# Patient Record
Sex: Male | Born: 1935 | Race: White | Hispanic: No | Marital: Married | Smoking: Former smoker
Health system: Southern US, Community
[De-identification: ages and names within clinical notes are randomized; demographics above are authoritative.]

## PROBLEM LIST (undated history)

## (undated) DIAGNOSIS — R001 Bradycardia, unspecified: Secondary | ICD-10-CM

## (undated) DIAGNOSIS — F0390 Unspecified dementia without behavioral disturbance: Secondary | ICD-10-CM

## (undated) DIAGNOSIS — E785 Hyperlipidemia, unspecified: Secondary | ICD-10-CM

## (undated) DIAGNOSIS — R55 Syncope and collapse: Secondary | ICD-10-CM

## (undated) DIAGNOSIS — L0390 Cellulitis, unspecified: Secondary | ICD-10-CM

## (undated) HISTORY — DX: Cellulitis, unspecified: L03.90

## (undated) HISTORY — DX: Hyperlipidemia, unspecified: E78.5

---

## 1991-11-12 HISTORY — PX: KNEE ARTHROSCOPY: SUR90

## 1996-11-11 HISTORY — PX: FOOT SURGERY: SHX648

## 1998-05-11 ENCOUNTER — Encounter: Payer: Self-pay | Admitting: Family Medicine

## 1998-05-11 LAB — CONVERTED CEMR LAB: PSA: 1.2 ng/mL

## 1999-03-05 ENCOUNTER — Emergency Department (HOSPITAL_COMMUNITY): Admission: EM | Admit: 1999-03-05 | Discharge: 1999-03-05 | Payer: Self-pay | Admitting: *Deleted

## 1999-03-05 ENCOUNTER — Encounter: Payer: Self-pay | Admitting: Emergency Medicine

## 1999-12-13 ENCOUNTER — Encounter: Payer: Self-pay | Admitting: Family Medicine

## 1999-12-13 DIAGNOSIS — E785 Hyperlipidemia, unspecified: Secondary | ICD-10-CM

## 1999-12-13 HISTORY — DX: Hyperlipidemia, unspecified: E78.5

## 1999-12-13 LAB — CONVERTED CEMR LAB: PSA: 1 ng/mL

## 2001-01-09 HISTORY — PX: KNEE ARTHROSCOPY: SUR90

## 2002-09-11 ENCOUNTER — Encounter: Payer: Self-pay | Admitting: Family Medicine

## 2002-09-11 LAB — CONVERTED CEMR LAB: PSA: 1.3 ng/mL

## 2003-10-12 ENCOUNTER — Encounter: Payer: Self-pay | Admitting: Family Medicine

## 2004-11-26 ENCOUNTER — Ambulatory Visit: Payer: Self-pay | Admitting: Family Medicine

## 2005-01-09 ENCOUNTER — Encounter: Payer: Self-pay | Admitting: Family Medicine

## 2005-01-22 ENCOUNTER — Ambulatory Visit: Payer: Self-pay | Admitting: Family Medicine

## 2005-01-24 ENCOUNTER — Ambulatory Visit: Payer: Self-pay | Admitting: Family Medicine

## 2005-02-07 ENCOUNTER — Ambulatory Visit: Payer: Self-pay | Admitting: Family Medicine

## 2005-07-17 ENCOUNTER — Ambulatory Visit: Payer: Self-pay | Admitting: Family Medicine

## 2005-07-19 ENCOUNTER — Ambulatory Visit: Payer: Self-pay | Admitting: Family Medicine

## 2005-07-23 ENCOUNTER — Ambulatory Visit: Payer: Self-pay | Admitting: Family Medicine

## 2005-08-06 ENCOUNTER — Ambulatory Visit: Payer: Self-pay | Admitting: Family Medicine

## 2006-01-09 ENCOUNTER — Encounter: Payer: Self-pay | Admitting: Family Medicine

## 2006-01-09 LAB — CONVERTED CEMR LAB: PSA: 1.52 ng/mL

## 2006-01-27 ENCOUNTER — Ambulatory Visit: Payer: Self-pay | Admitting: Family Medicine

## 2006-01-30 ENCOUNTER — Ambulatory Visit: Payer: Self-pay | Admitting: Family Medicine

## 2006-02-12 ENCOUNTER — Ambulatory Visit: Payer: Self-pay | Admitting: Family Medicine

## 2006-05-06 ENCOUNTER — Ambulatory Visit: Payer: Self-pay | Admitting: Family Medicine

## 2006-05-15 ENCOUNTER — Ambulatory Visit: Payer: Self-pay | Admitting: Family Medicine

## 2007-01-10 ENCOUNTER — Encounter: Payer: Self-pay | Admitting: Family Medicine

## 2007-01-10 LAB — CONVERTED CEMR LAB: PSA: 2.74 ng/mL

## 2007-02-02 ENCOUNTER — Ambulatory Visit: Payer: Self-pay | Admitting: Family Medicine

## 2007-02-02 LAB — CONVERTED CEMR LAB
ALT: 14 units/L (ref 0–40)
AST: 21 units/L (ref 0–37)
Albumin: 3.7 g/dL (ref 3.5–5.2)
Alkaline Phosphatase: 61 units/L (ref 39–117)
BUN: 11 mg/dL (ref 6–23)
Bilirubin, Direct: 0.1 mg/dL (ref 0.0–0.3)
CO2: 31 meq/L (ref 19–32)
Calcium: 8.9 mg/dL (ref 8.4–10.5)
Chloride: 105 meq/L (ref 96–112)
Cholesterol: 161 mg/dL (ref 0–200)
Creatinine, Ser: 1.1 mg/dL (ref 0.4–1.5)
GFR calc Af Amer: 85 mL/min
GFR calc non Af Amer: 70 mL/min
Glucose, Bld: 93 mg/dL (ref 70–99)
HDL: 33.5 mg/dL — ABNORMAL LOW (ref >39.0)
LDL Cholesterol: 103 mg/dL — ABNORMAL HIGH (ref 0–99)
PSA: 2.74 ng/mL (ref 0.10–4.00)
Potassium: 4.3 meq/L (ref 3.5–5.1)
Sodium: 142 meq/L (ref 135–145)
TSH: 1.72 microintl units/mL (ref 0.35–5.50)
Total Bilirubin: 0.8 mg/dL (ref 0.3–1.2)
Total CHOL/HDL Ratio: 4.8
Total Protein: 6.1 g/dL (ref 6.0–8.3)
Triglycerides: 122 mg/dL (ref 0–149)
VLDL: 24 mg/dL (ref 0–40)

## 2007-02-03 ENCOUNTER — Encounter: Payer: Self-pay | Admitting: Family Medicine

## 2007-02-03 DIAGNOSIS — N4341 Spermatocele of epididymis, single: Secondary | ICD-10-CM | POA: Insufficient documentation

## 2007-02-03 DIAGNOSIS — F528 Other sexual dysfunction not due to a substance or known physiological condition: Secondary | ICD-10-CM

## 2007-02-03 DIAGNOSIS — N433 Hydrocele, unspecified: Secondary | ICD-10-CM

## 2007-02-03 HISTORY — DX: Other sexual dysfunction not due to a substance or known physiological condition: F52.8

## 2007-02-03 HISTORY — DX: Hydrocele, unspecified: N43.3

## 2007-02-04 DIAGNOSIS — I452 Bifascicular block: Secondary | ICD-10-CM

## 2007-02-04 DIAGNOSIS — L259 Unspecified contact dermatitis, unspecified cause: Secondary | ICD-10-CM | POA: Insufficient documentation

## 2007-02-04 DIAGNOSIS — M722 Plantar fascial fibromatosis: Secondary | ICD-10-CM | POA: Insufficient documentation

## 2007-02-04 HISTORY — DX: Bifascicular block: I45.2

## 2007-02-05 ENCOUNTER — Ambulatory Visit: Payer: Self-pay | Admitting: Family Medicine

## 2007-02-23 ENCOUNTER — Ambulatory Visit: Payer: Self-pay | Admitting: Family Medicine

## 2007-04-03 ENCOUNTER — Ambulatory Visit: Payer: Self-pay | Admitting: Family Medicine

## 2007-04-03 DIAGNOSIS — R972 Elevated prostate specific antigen [PSA]: Secondary | ICD-10-CM | POA: Insufficient documentation

## 2007-04-06 LAB — CONVERTED CEMR LAB: PSA: 3.19 ng/mL (ref 0.10–4.00)

## 2007-04-08 ENCOUNTER — Ambulatory Visit: Payer: Self-pay | Admitting: Family Medicine

## 2007-04-08 DIAGNOSIS — M549 Dorsalgia, unspecified: Secondary | ICD-10-CM | POA: Insufficient documentation

## 2007-04-15 ENCOUNTER — Encounter: Payer: Self-pay | Admitting: Family Medicine

## 2007-05-07 LAB — CONVERTED CEMR LAB: PSA: 2.96 ng/mL

## 2007-06-05 ENCOUNTER — Ambulatory Visit: Payer: Self-pay | Admitting: Family Medicine

## 2007-06-08 ENCOUNTER — Encounter (INDEPENDENT_AMBULATORY_CARE_PROVIDER_SITE_OTHER): Payer: Self-pay | Admitting: *Deleted

## 2008-02-04 ENCOUNTER — Ambulatory Visit: Payer: Self-pay | Admitting: Family Medicine

## 2008-02-04 DIAGNOSIS — E785 Hyperlipidemia, unspecified: Secondary | ICD-10-CM

## 2008-02-04 LAB — CONVERTED CEMR LAB
ALT: 19 units/L (ref 0–53)
AST: 21 units/L (ref 0–37)
Albumin: 4 g/dL (ref 3.5–5.2)
Alkaline Phosphatase: 71 units/L (ref 39–117)
BUN: 9 mg/dL (ref 6–23)
Bilirubin, Direct: 0.1 mg/dL (ref 0.0–0.3)
CO2: 33 meq/L — ABNORMAL HIGH (ref 19–32)
Calcium: 9 mg/dL (ref 8.4–10.5)
Chloride: 102 meq/L (ref 96–112)
Cholesterol: 164 mg/dL (ref 0–200)
Creatinine, Ser: 1.2 mg/dL (ref 0.4–1.5)
GFR calc Af Amer: 77 mL/min
GFR calc non Af Amer: 63 mL/min
Glucose, Bld: 93 mg/dL (ref 70–99)
HDL: 34.8 mg/dL — ABNORMAL LOW (ref >39.0)
LDL Cholesterol: 115 mg/dL — ABNORMAL HIGH (ref 0–99)
PSA: 2.47 ng/mL (ref 0.10–4.00)
Potassium: 4.8 meq/L (ref 3.5–5.1)
Sodium: 138 meq/L (ref 135–145)
TSH: 1.19 microintl units/mL (ref 0.35–5.50)
Total Bilirubin: 0.9 mg/dL (ref 0.3–1.2)
Total CHOL/HDL Ratio: 4.7
Total Protein: 6.6 g/dL (ref 6.0–8.3)
Triglycerides: 70 mg/dL (ref 0–149)
VLDL: 14 mg/dL (ref 0–40)

## 2008-02-09 ENCOUNTER — Ambulatory Visit: Payer: Self-pay | Admitting: Family Medicine

## 2008-03-21 ENCOUNTER — Ambulatory Visit: Payer: Self-pay | Admitting: Family Medicine

## 2008-03-21 LAB — CONVERTED CEMR LAB
OCCULT 1: NEGATIVE
OCCULT 2: NEGATIVE
OCCULT 3: NEGATIVE

## 2008-03-23 ENCOUNTER — Encounter (INDEPENDENT_AMBULATORY_CARE_PROVIDER_SITE_OTHER): Payer: Self-pay | Admitting: *Deleted

## 2008-09-22 ENCOUNTER — Ambulatory Visit: Payer: Self-pay | Admitting: Family Medicine

## 2009-02-07 ENCOUNTER — Ambulatory Visit: Payer: Self-pay | Admitting: Family Medicine

## 2009-02-07 LAB — CONVERTED CEMR LAB
ALT: 18 units/L (ref 0–53)
AST: 22 units/L (ref 0–37)
Albumin: 3.9 g/dL (ref 3.5–5.2)
Alkaline Phosphatase: 66 units/L (ref 39–117)
BUN: 13 mg/dL (ref 6–23)
Bilirubin, Direct: 0.2 mg/dL (ref 0.0–0.3)
CO2: 32 meq/L (ref 19–32)
Calcium: 9 mg/dL (ref 8.4–10.5)
Chloride: 106 meq/L (ref 96–112)
Cholesterol: 162 mg/dL (ref 0–200)
Creatinine, Ser: 1.1 mg/dL (ref 0.4–1.5)
GFR calc non Af Amer: 69.74 mL/min (ref >60)
Glucose, Bld: 101 mg/dL — ABNORMAL HIGH (ref 70–99)
HDL: 30.8 mg/dL — ABNORMAL LOW (ref >39.00)
LDL Cholesterol: 111 mg/dL — ABNORMAL HIGH (ref 0–99)
PSA: 2.11 ng/mL (ref 0.10–4.00)
Potassium: 4.5 meq/L (ref 3.5–5.1)
Sodium: 142 meq/L (ref 135–145)
TSH: 1.49 microintl units/mL (ref 0.35–5.50)
Total Bilirubin: 1.1 mg/dL (ref 0.3–1.2)
Total CHOL/HDL Ratio: 5
Total Protein: 6.3 g/dL (ref 6.0–8.3)
Triglycerides: 102 mg/dL (ref 0.0–149.0)
VLDL: 20.4 mg/dL (ref 0.0–40.0)

## 2009-02-09 ENCOUNTER — Ambulatory Visit: Payer: Self-pay | Admitting: Family Medicine

## 2009-03-02 ENCOUNTER — Ambulatory Visit: Payer: Self-pay | Admitting: Family Medicine

## 2009-03-03 ENCOUNTER — Encounter (INDEPENDENT_AMBULATORY_CARE_PROVIDER_SITE_OTHER): Payer: Self-pay | Admitting: *Deleted

## 2009-12-14 ENCOUNTER — Ambulatory Visit: Payer: Self-pay | Admitting: Family Medicine

## 2009-12-22 ENCOUNTER — Encounter: Payer: Self-pay | Admitting: Family Medicine

## 2010-01-01 ENCOUNTER — Ambulatory Visit: Payer: Self-pay | Admitting: Family Medicine

## 2010-01-03 ENCOUNTER — Encounter: Payer: Self-pay | Admitting: Family Medicine

## 2010-01-08 ENCOUNTER — Telehealth: Payer: Self-pay | Admitting: Family Medicine

## 2010-01-15 ENCOUNTER — Ambulatory Visit: Payer: Self-pay | Admitting: Family Medicine

## 2010-02-13 ENCOUNTER — Ambulatory Visit: Payer: Self-pay | Admitting: Family Medicine

## 2010-02-13 LAB — CONVERTED CEMR LAB
ALT: 19 units/L (ref 0–53)
Albumin: 4 g/dL (ref 3.5–5.2)
Alkaline Phosphatase: 72 units/L (ref 39–117)
Bilirubin, Direct: 0.1 mg/dL (ref 0.0–0.3)
CO2: 30 meq/L (ref 19–32)
Calcium: 9.1 mg/dL (ref 8.4–10.5)
Chloride: 103 meq/L (ref 96–112)
Creatinine, Ser: 1.2 mg/dL (ref 0.4–1.5)
Eosinophils Relative: 1.3 % (ref 0.0–5.0)
Glucose, Bld: 89 mg/dL (ref 70–99)
HCT: 43.8 % (ref 39.0–52.0)
HDL: 38 mg/dL — ABNORMAL LOW (ref >39.00)
Hemoglobin: 15.4 g/dL (ref 13.0–17.0)
Lymphs Abs: 1.3 10*3/uL (ref 0.7–4.0)
MCV: 94.9 fL (ref 78.0–100.0)
Monocytes Absolute: 0.5 10*3/uL (ref 0.1–1.0)
Monocytes Relative: 10.7 % (ref 3.0–12.0)
Neutro Abs: 2.5 10*3/uL (ref 1.4–7.7)
Platelets: 101 10*3/uL — ABNORMAL LOW (ref 150.0–400.0)
Total CHOL/HDL Ratio: 5
Total Protein: 6.5 g/dL (ref 6.0–8.3)
Triglycerides: 97 mg/dL (ref 0.0–149.0)
WBC: 4.3 10*3/uL — ABNORMAL LOW (ref 4.5–10.5)

## 2010-02-19 ENCOUNTER — Ambulatory Visit: Payer: Self-pay | Admitting: Family Medicine

## 2010-03-19 ENCOUNTER — Ambulatory Visit: Payer: Self-pay | Admitting: Family Medicine

## 2010-03-19 LAB — CONVERTED CEMR LAB
OCCULT 1: NEGATIVE
OCCULT 2: NEGATIVE
OCCULT 3: NEGATIVE

## 2010-03-20 ENCOUNTER — Encounter (INDEPENDENT_AMBULATORY_CARE_PROVIDER_SITE_OTHER): Payer: Self-pay | Admitting: *Deleted

## 2010-06-14 ENCOUNTER — Encounter (INDEPENDENT_AMBULATORY_CARE_PROVIDER_SITE_OTHER): Payer: Self-pay | Admitting: *Deleted

## 2010-10-03 ENCOUNTER — Ambulatory Visit: Payer: Self-pay | Admitting: Family Medicine

## 2010-12-13 NOTE — Assessment & Plan Note (Signed)
Summary: CUT LEFT LEG and NOT HEALING / LFW   Vital Signs:  Patient profile:   77 year old male Weight:      173 pounds Temp:     97.4 degrees F oral Pulse rate:   60 / minute Pulse rhythm:   regular BP sitting:   112 / 70  (left arm) Cuff size:   regular  Vitals Entered By: Sydell Axon LPN (October 03, 2010 2:34 PM) CC: Cut left leg about 2 weeks ago and it is not healing   History of Present Illness: Pt here for having split open his left shin on a rusty piece of metal 2 weeks ago. He has taken good care of it but still filling in and healing and he wanted eval.   Problems Prior to Update: 1)  Open Wound Left Shin Without Mention Comp  (ICD-891.0) 2)  Health Maintenance Exam  (ICD-V70.0) 3)  Special Screening Malignant Neoplasm of Prostate  (ICD-V76.44) 4)  Hypercholesterolemia  (ICD-272.0) 5)  Screening For Malignannt Neoplasm, Site Nec  (ICD-V76.49) 6)  Backache Nos  (ICD-724.5) 7)  Elevated Prostate Specific Antigen  (ICD-790.93) 8)  Block, Rbb/left Ant Fascicular Block  (ICD-426.52) 9)  Dermatitis, Contact, Nos  (ICD-692.9) 10)  Plantar Fasciitis, Bilateral  (ICD-728.71) 11)  Hydrocele, Left  (ICD-603.9) 12)  Erectile Dysfunction, Mild  (ICD-302.72)  Medications Prior to Update: 1)  Viagra 100 Mg Tabs (Sildenafil Citrate) .... One Tab By Mouth One Hour Prior 2)  Temovate E 0.05 % Crea (Clobetasol Prop Emollient Base) .... Use On Area Two Times A Day Never On Face. 3)  Biofreeze Gel .... As Needed  Allergies: 1)  ! * Lincocin 2)  ! * Lincocin  Physical Exam  General:  Well-developed,well-nourished,in no acute distress; alert,appropriate and cooperative throughout examination Extremities:  L ant shin, healing 1cm laceratiuon by secondary intention. Has approx 5mm left to fill in. No fluctulance and only erythema of healing evident. No tenderness, no discharge.   Impression & Recommendations:  Problem # 1:  OPEN WOUND LEFT SHIN WITHOUT MENTION COMP  (ICD-891.0) Assessment New Let heal, keep clean. Tdap today....>5 yrs since last with rusty metal the offending agent.  Complete Medication List: 1)  Viagra 100 Mg Tabs (Sildenafil citrate) .... One tab by mouth one hour prior 2)  Temovate E 0.05 % Crea (Clobetasol prop emollient base) .... Use on area two times a day never on face. 3)  Biofreeze Gel  .... As needed  Other Orders: Tdap => 72yrs IM (54098) Admin 1st Vaccine (11914)  Patient Instructions: 1)  RTC for further problems.   Orders Added: 1)  Est. Patient Level III [78295] 2)  Tdap => 55yrs IM [90715] 3)  Admin 1st Vaccine [62130]   Immunizations Administered:  Tetanus Vaccine:    Vaccine Type: Tdap    Site: left deltoid    Mfr: GlaxoSmithKline    Dose: 0.5 ml    Route: IM    Given by: Sydell Axon LPN    Exp. Date: 08/30/2012    Lot #: [id number]    VIS given: 09/28/08 version given October 03, 2010.   Immunizations Administered:  Tetanus Vaccine:    Vaccine Type: Tdap    Site: left deltoid    Mfr: GlaxoSmithKline    Dose: 0.5 ml    Route: IM    Given by: Sydell Axon LPN    Exp. Date: 08/30/2012    Lot #: [id number]    VIS given: 09/28/08 version given October 03, 2010.  Current Allergies (reviewed today): ! * LINCOCIN ! * LINCOCIN

## 2010-12-13 NOTE — Consult Note (Signed)
Summary: Dr.Ronald Davis,[facility],Note  Dr.Ronald Davis,[facility],Note   Imported By: Beau Fanny 12/27/2009 14:57:33  _____________________________________________________________________  External Attachment:    Type:   Image     Comment:   External Document

## 2010-12-13 NOTE — Letter (Signed)
Summary: Results Follow up Letter  [place] [facility]  [address] [place], [place] [address]   Phone: [phone]  Fax: [phone]    03/20/2010 MRN: [id number]  ELAM ELLIS [address] [place], [place]  [address]  Dear Mr. FARINO,  The following are the results of your recent test(s):  Test         Result    Pap Smear:        Normal _____  Not Normal _____ Comments: ______________________________________________________ Cholesterol: LDL(Bad cholesterol):         Your goal is less than:         HDL (Good cholesterol):       Your goal is more than: Comments:  ______________________________________________________ Mammogram:        Normal _____  Not Normal _____ Comments:  ___________________________________________________________________ Hemoccult:        Normal __X___  Not normal _______ Comments:  Please repeat in one year.  _____________________________________________________________________ Other Tests:    We routinely do not discuss normal results over the telephone.  If you desire a copy of the results, or you have any questions about this information we can discuss them at your next office visit.   Sincerely,     Laurita Quint, MD

## 2010-12-13 NOTE — Letter (Signed)
Summary: [facility] / EVALUATION / DR. RONALD DAVIS  [facility] / EVALUATION / DR. RONALD DAVIS   Imported By: Carin Primrose 12/26/2009 16:08:46  _____________________________________________________________________  External Attachment:    Type:   Image     Comment:   External Document

## 2010-12-13 NOTE — Assessment & Plan Note (Signed)
Summary: CPX/DLO   Vital Signs:  Patient profile:   77 year old male Height:      72 inches Weight:      171.50 pounds BMI:     23.34 Temp:     98.7 degrees F oral Pulse rate:   60 / minute Pulse rhythm:   regular BP sitting:   126 / 74  (left arm) Cuff size:   regular  Vitals Entered By: Sydell Axon LPN (February 19, 2010 2:51 PM) CC: 30 Minute checkup, hemoccult cards given to patient   History of Present Illness: Pt here for Comp Exam. He has a lesion on the back of his left hand that he thought he had a splinter but never got the splinter out. Now he thinks it might have been a  He was seen by a chiropracter after being seen here and the chiro suggested cerviacl MRI.   Preventive Screening-Counseling & Management  Alcohol-Tobacco     Alcohol drinks/day: 0     Smoking Status: quit > 6 months     Packs/Day: pack a week     Year Started: 1955     Year Quit: 1962     Pack years: 5     Passive Smoke Exposure: no  Caffeine-Diet-Exercise     Caffeine use/day: 3     Does Patient Exercise: no  Problems Prior to Update: 1)  Health Maintenance Exam  (ICD-V70.0) 2)  Special Screening Malignant Neoplasm of Prostate  (ICD-V76.44) 3)  Hypercholesterolemia  (ICD-272.0) 4)  Screening For Malignannt Neoplasm, Site Nec  (ICD-V76.49) 5)  Backache Nos  (ICD-724.5) 6)  Elevated Prostate Specific Antigen  (ICD-790.93) 7)  Block, Rbb/left Ant Fascicular Block  (ICD-426.52) 8)  Dermatitis, Contact, Nos  (ICD-692.9) 9)  Plantar Fasciitis, Bilateral  (ICD-728.71) 10)  Hydrocele, Left  (ICD-603.9) 11)  Erectile Dysfunction, Mild  (ICD-302.72)  Medications Prior to Update: 1)  Viagra 100 Mg Tabs (Sildenafil Citrate) .... One Tab By Mouth One Hour Prior 2)  Temovate E 0.05 % Crea (Clobetasol Prop Emollient Base) .... Use On Area Two Times A Day Never On Face. 3)  Biofreeze Gel .... As Needed 4)  Flexeril 10 Mg Tabs (Cyclobenzaprine Hcl) .... One Tab By Mouth Three Times A Day As Needed 5)   Tylenol Extra Strength 500 Mg Tabs (Acetaminophen) .... As Needed  Allergies: 1)  ! * Lincocin 2)  ! * Lincocin  Family History: Father:dec 84 CHF Stroke ( 12 years of age) Mother:dec 99 CHF  Siblings: Brother A 80  VA Brother A 80  VA Brother A 64 Sister A 75 Shoulder Surg  Social History: Caffeine use/day:  3  Review of Systems General:  Denies chills, fatigue, fever, sweats, weakness, and weight loss. Eyes:  Denies blurring, discharge, and eye pain. ENT:  Complains of ringing in ears; denies decreased hearing and ear discharge; occas left ear. CV:  Denies chest pain or discomfort, fainting, fatigue, palpitations, shortness of breath with exertion, swelling of feet, and swelling of hands. Resp:  Denies cough, shortness of breath, and wheezing. GI:  Denies abdominal pain, bloody stools, change in bowel habits, constipation, dark tarry stools, diarrhea, indigestion, loss of appetite, nausea, vomiting, vomiting blood, and yellowish skin color. GU:  Denies dysuria, nocturia, and urinary frequency. MS:  Complains of joint pain and low back pain; denies muscle aches, cramps, muscle weakness, and stiffness. Derm:  Complains of rash; denies dryness and itching. Neuro:  Denies numbness, poor balance, tingling, and tremors.  Physical  Exam  General:  Well-developed,well-nourished,in no acute distress; alert,appropriate and cooperative throughout examination Head:  Normocephalic and atraumatic without obvious abnormalities. No apparent alopecia or balding. Eyes:  Conjunctiva clear bilaterally.  Ears:  External ear exam shows no significant lesions or deformities.  Otoscopic examination reveals clear canals, tympanic membranes are intact bilaterally without bulging, retraction, inflammation or discharge. Hearing is grossly normal bilaterally. Nose:  External nasal examination shows no deformity or inflammation. Nasal mucosa are pink and moist without lesions or exudates. Mouth:  Oral mucosa  and oropharynx without lesions or exudates.  Teeth in good repair. Neck:  No deformities, masses  noted. Chest Wall:  No deformities, masses, tenderness or gynecomastia noted. Breasts:  No masses or gynecomastia noted Lungs:  Normal respiratory effort, chest expands symmetrically. Lungs are clear to auscultation, no crackles or wheezes. Heart:  Normal rate and regular rhythm. S1 and S2 normal without gallop, murmur, click, rub or other extra sounds. Abdomen:  Bowel sounds positive,abdomen soft and non-tender without masses, organomegaly or hernias noted. Rectal:  No external abnormalities noted. Normal sphincter tone. No rectal masses or tenderness. G neg. Genitalia:  Testes bilaterally descended withoutnodularity or tenderness. No scrotal masses but for a large hydrocele of the left scrotum. No other  lesions. No penis lesions or urethral discharge. Small right inguinal hernia, feels indirect. Prostate:  Prostate gland firm and smooth, no enlargement, nodularity, tenderness, mass, asymmetry or induration. 40-60gms. Msk:  Patient has pain to deep palpation over R scapula.  He has normal range of motion and strength in both shoulders and arms and doing well today.. Pulses:  R and L carotid,radial,femoral,dorsalis pedis and posterior tibial pulses are full and equal bilaterally Extremities:  No clubbing, cyanosis, edema, or deformity noted with normal full range of motion of all joints.   Neurologic:  No cranial nerve deficits noted. Station and gait are normal. Sensory, motor and coordinative functions appear intact. Skin:  Intact without suspicious lesions or rashes except for small pauplar eruption on the dorsal surface of the left hand over a superficial vein. Well cicumscribed borders with mild inflammation and redness that looks to be healing. Cervical Nodes:  No lymphadenopathy noted Inguinal Nodes:  No significant adenopathy Psych:  Cognition and judgment appear intact. Alert and cooperative  with normal attention span and concentration. No apparent delusions, illusions, hallucinations, well adjusted with good eye contact.   Impression & Recommendations:  Problem # 1:  HEALTH MAINTENANCE EXAM (ICD-V70.0) Discussed labs, meds and immunizations. Could use a pneumovax next time.  Problem # 2:  SPECIAL SCREENING MALIGNANT NEOPLASM OF PROSTATE (ICD-V76.44) Assessment: Unchanged Stable PSA and exam.  Problem # 3:  BACKACHE NOS (ICD-724.5) Assessment: Unchanged Stable and doing well today. The following medications were removed from the medication list:    Flexeril 10 Mg Tabs (Cyclobenzaprine hcl) ..... One tab by mouth three times a day as needed    Tylenol Extra Strength 500 Mg Tabs (Acetaminophen) .Marland Kitchen... As needed  Problem # 4:  ELEVATED PROSTATE SPECIFIC ANTIGEN (ICD-790.93) Assessment: Unchanged Stable today.  Problem # 5:  PLANTAR FASCIITIS, BILATERAL (ICD-728.71) Assessment: Improved Basically nml today.  Problem # 6:  HYDROCELE, LEFT (ICD-603.9) Assessment: Unchanged Unchanged, bothers him at times.  Problem # 7:  ERECTILE DYSFUNCTION, MILD (ICD-302.72) Assessment: Unchanged Uses Viagra with good results. Requests refill. His updated medication list for this problem includes:    Viagra 100 Mg Tabs (Sildenafil citrate) ..... One tab by mouth one hour prior  Discussed proper use of medications, as well  as side effects.   Complete Medication List: 1)  Viagra 100 Mg Tabs (Sildenafil citrate) .... One tab by mouth one hour prior 2)  Temovate E 0.05 % Crea (Clobetasol prop emollient base) .... Use on area two times a day never on face. 3)  Biofreeze Gel  .... As needed  Patient Instructions: 1)  Pneumovax next time. Prescriptions: VIAGRA 100 MG TABS (SILDENAFIL CITRATE) one tab by mouth one hour prior  #10 x 12   Entered and Authorized by:   Shaune Leeks MD   Signed by:   Shaune Leeks MD on 02/19/2010   Method used:   Electronically to         [facility] [address] (retail)       [address], [address]       [place], [place]  [address]       Ph: [phone]       Fax: [phone]   RxID:   [id number]   Current Allergies (reviewed today): ! * LINCOCIN ! * LINCOCIN

## 2010-12-13 NOTE — Assessment & Plan Note (Signed)
Summary: RIGHT SIDE PAIN / LFW   Vital Signs:  Patient profile:   77 year old male Weight:      173 pounds Temp:     98.7 degrees F oral Pulse rate:   60 / minute Pulse rhythm:   regular BP sitting:   128 / 78  (left arm) Cuff size:   regular  Vitals Entered By: Sydell Axon LPN (January 01, 2010 3:35 PM) CC: Pain in right shoulder blade and upper right arm   History of Present Illness: Pt here for pain in the right chest through to the medial side of the shoulder blade. He is unable to sleep for the pain. He denies SOB, no episode of inactivuity. 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 He can think of notyhing he has done unusual. When he gets to work and has nothing to do, he stretches aggressively. He does this every day, does not remember feeling as if he pulled anyhting and otherwise denies falling or pulling anything. Again he denies SOB or overt chest pain and he can definitely reproduce the discomfort with movement of the rightt shoulder.  Problems Prior to Update: 1)  Health Maintenance Exam  (ICD-V70.0) 2)  Special Screening Malignant Neoplasm of Prostate  (ICD-V76.44) 3)  Hypercholesterolemia  (ICD-272.0) 4)  Screening For Malignannt Neoplasm, Site Nec  (ICD-V76.49) 5)  Backache Nos  (ICD-724.5) 6)  Elevated Prostate Specific Antigen  (ICD-790.93) 7)  Block, Rbb/left Ant Fascicular Block  (ICD-426.52) 8)  Dermatitis, Contact, Nos  (ICD-692.9) 9)  Plantar Fasciitis, Bilateral  (ICD-728.71) 10)  Hydrocele, Left  (ICD-603.9) 11)  Erectile Dysfunction, Mild  (ICD-302.72)  Medications Prior to Update: 1)  Viagra 100 Mg Tabs (Sildenafil Citrate) .... One Tab By Mouth One Hour Prior 2)  Temovate E 0.05 % Crea (Clobetasol Prop Emollient Base) .... Use On Area Two Times A Day Never On Face.  Allergies: 1)  ! * Lincocin 2)  ! * Lincocin  Physical Exam  General:  Well-developed,well-nourished,in no acute distress;  alert,appropriate and cooperative throughout examination Head:  Normocephalic and atraumatic without obvious abnormalities. No apparent alopecia or balding. Eyes:  Conjunctiva clear bilaterally.  Ears:  External ear exam shows no significant lesions or deformities.  Otoscopic examination reveals clear canals, tympanic membranes are intact bilaterally without bulging, retraction, inflammation or discharge. Hearing is grossly normal bilaterally. Nose:  External nasal examination shows no deformity or inflammation. Nasal mucosa are pink and moist without lesions or exudates. Mouth:  Oral mucosa and oropharynx without lesions or exudates.  Teeth in good repair. Msk:  Right shoulder tender to deep palpation lateral and above the right latera edge of the scapula andf in the supraclavicular fosdsa. No swelling or echymosis noted. His upper Bicep area ached after resisted manipulation of the shouklder blade and upper lateral back muscles.   Impression & Recommendations:  Problem # 1:  BACK STRAIN, RIGHT UPPER (ICD-847.9) Assessment New Pooss suscapularis, poss rotator cuff stabilizers.   See instructions.  Complete Medication List: 1)  Viagra 100 Mg Tabs (Sildenafil citrate) .... One tab by mouth one hour prior 2)  Temovate E 0.05 % Crea (Clobetasol prop emollient base) .... Use on area two times a day never on face. 3)  Biofreeze Gel  .... As needed 4)  Flexeril 10 Mg Tabs (Cyclobenzaprine hcl) .... One tab by mouth three times a day  Patient Instructions: 1)  Take 600mg  of IBP after meals. 2)  Take Flexeril 10mg   three times a day . 3)  Use heat and ice.  Prescriptions: FLEXERIL 10 MG TABS (CYCLOBENZAPRINE HCL) one tab by mouth three times a day  #50 x 0   Entered and Authorized by:   Shaune Leeks MD   Signed by:   Shaune Leeks MD on 01/01/2010   Method used:   Electronically to        [facility] [address] (retail)       [address]       [place], [place]  [address]       Ph: [phone]       Fax: [phone]   RxID:   [id number]   Current Allergies (reviewed today): ! * LINCOCIN ! * LINCOCIN

## 2010-12-13 NOTE — Letter (Signed)
Summary: Nadara Eaton letter  [place] at [facility]  [address] [place], [place] [address]   Phone: [phone]  Fax: [phone]       06/14/2010 MRN: [id number]  COULTER OLDAKER [address] [place], [place]  [address]  Dear Mr. DONTAVIS, [facility], and [facility] announce the retirement of Arta Silence, M.D., from full-time practice at the [facility] office effective May 10, 2010 and his plans of returning part-time.  It is important to Dr. Hetty Ely and to our practice that you understand that [facility] has seven physicians in our office for your health care needs.  We will continue to offer the same exceptional care that you have today.    Dr. Hetty Ely has spoken to many of you about his plans for retirement and returning part-time in the fall.   We will continue to work with you through the transition to schedule appointments for you in the office and meet the high standards that [place] is committed to.   Again, it is with great pleasure that we share the news that Dr. Hetty Ely will return to [facility] in October of 2011 with a reduced schedule.    If you have any questions, or would like to request an appointment with one of our physicians, please call us at [phone] and press the option for Scheduling an appointment.  We take pleasure in providing you with excellent patient care and look forward to seeing you at your next office visit.  Our [facility] Physicians are:  Tillman Abide, M.D. Laurita Quint, M.D. Roxy Manns, M.D. Kerby Nora, M.D. Hannah Beat, M.D. Ruthe Mannan, M.D. We proudly welcomed Raechel Ache, M.D. and Eustaquio Boyden, M.D. to the practice in July/August 2011.  Sincerely,  [facility]

## 2010-12-13 NOTE — Assessment & Plan Note (Signed)
Summary: RIGHT SHOULDER AND ARM PAIN   Vital Signs:  Patient profile:   77 year old male Weight:      174.25 pounds Temp:     98.7 degrees F oral Pulse rate:   72 / minute Pulse rhythm:   regular BP sitting:   120 / 82  (left arm) Cuff size:   regular  Vitals Entered By: Dylan [name] (January 15, 1609 3:58 PM) CC: Still having right shoulder and arm pain   History of Present Illness: Dylan [name] is a 77 y/o caucasian male who presents today for a 2 week f/u from R arm and shoulder pain.  He began having pain two weeks ago and was prescribed ibuprofen and flexeril.  He called in complaining of swelling and weigtt gain (4 lbs) about 1 week ago and was switched to tyelenol extra strength.  The swelling and weight gain went away.  He is here today becasue he is still having pain in his right shoulder although the pain is not as sever as it was at the onset of symptoms two weeks ago.  Problems Prior to Update: 1)  Back Strain, Right Upper  (ICD-847.9) 2)  Health Maintenance Exam  (ICD-V70.0) 3)  Special Screening Malignant Neoplasm of Prostate  (ICD-V76.44) 4)  Hypercholesterolemia  (ICD-272.0) 5)  Screening For Malignannt Neoplasm, Site Nec  (ICD-V76.49) 6)  Backache Nos  (ICD-724.5) 7)  Elevated Prostate Specific Antigen  (ICD-790.93) 8)  Block, Rbb/left Ant Fascicular Block  (ICD-426.52) 9)  Dermatitis, Contact, Nos  (ICD-692.9) 10)  Plantar Fasciitis, Bilateral  (ICD-728.71) 11)  Hydrocele, Left  (ICD-603.9) 12)  Erectile Dysfunction, Mild  (ICD-302.72)  Medications Prior to Update: 1)  Viagra 100 Mg Tabs (Sildenafil Citrate) .... One Tab By Mouth One Hour Prior 2)  Temovate E 0.05 % Crea (Clobetasol Prop Emollient Base) .... Use On Area Two Times A Day Never On Face. 3)  Biofreeze Gel .... As Needed 4)  Flexeril 10 Mg Tabs (Cyclobenzaprine Hcl) .... One Tab By Mouth Three Times A Day  Allergies: 1)  ! * Lincocin 2)  ! * Lincocin  Physical Exam  General:   Well-developed,well-nourished,in no acute distress; alert,appropriate and cooperative throughout examination Head:  Normocephalic and atraumatic without obvious abnormalities. No apparent alopecia or balding. Eyes:  Conjunctiva clear bilaterally.  Ears:  External ear exam shows no significant lesions or deformities.  Otoscopic examination reveals clear canals, tympanic membranes are intact bilaterally without bulging, retraction, inflammation or discharge. Hearing is grossly normal bilaterally. Nose:  External nasal examination shows no deformity or inflammation. Nasal mucosa are pink and moist without lesions or exudates. Mouth:  Oral mucosa and oropharynx without lesions or exudates.  Teeth in good repair. Neck:  No deformities, masses  noted. Chest Wall:  No deformities, masses, tenderness or gynecomastia noted. Lungs:  Normal respiratory effort, chest expands symmetrically. Lungs are clear to auscultation, no crackles or wheezes. Heart:  Normal rate and regular rhythm. S1 and S2 normal without gallop, murmur, click, rub or other extra sounds. Msk:  Patient has pain to deep palpation over R scapula.  He has normal range of motion and strength in both shoulders and arms.   Impression & Recommendations:  Problem # 1:  BACK STRAIN, RIGHT UPPER (ICD-847.9) Assessment Improved Mildly improved with conservative trmt...has stopped NSAID due to swelling which has now resolved. He is using some Tyl, taking Muscle relaxants and using heat an ice...continue all.  RTC if sxs don't slowly cont to improve.  Complete Medication List: 1)  Viagra 100 Mg Tabs (Sildenafil citrate) .... One tab by mouth one hour prior 2)  Temovate E 0.05 % Crea (Clobetasol prop emollient base) .... Use on area two times a day never on face. 3)  Biofreeze Gel  .... As needed 4)  Flexeril 10 Mg Tabs (Cyclobenzaprine hcl) .... One tab by mouth three times a day as needed 5)  Tylenol Extra Strength 500 Mg Tabs (Acetaminophen)  .... As needed  Current Allergies (reviewed today): ! * LINCOCIN ! * LINCOCIN

## 2010-12-13 NOTE — Letter (Signed)
Summary: Dr.Matthew Martin,[facility]  Dr.Matthew Martin,[facility],Note   Imported By: Beau Fanny 01/08/2010 11:24:16  _____________________________________________________________________  External Attachment:    Type:   Image     Comment:   External Document

## 2010-12-13 NOTE — Progress Notes (Signed)
Summary: feet swelling  Phone Note Call from Patient Call back at [phone]   Caller: Patient Call For: Shaune Leeks MD Summary of Call: Pt was seen last week for shoulder and arm pain and was given flexeril and was told to take 600 mg of ibuprofen three times a day. Now he has swelling in his feet and he has noticed that he has gained 4 pounds since taking new meds.  Please advise.  Initial call taken by: Lowella Petties CMA,  January 08, 2010 1:26 PM  Follow-up for Phone Call        Stop th IBP and take Tyl Es 2 three times a day instead. Follow-up by: Shaune Leeks MD,  January 08, 2010 2:36 PM  Additional Follow-up for Phone Call Additional follow up Details #1::        Advised pt. Additional Follow-up by: Lowella Petties CMA,  January 08, 2010 2:41 PM

## 2010-12-13 NOTE — Assessment & Plan Note (Signed)
Summary: PAIN LOWER STOMACH/CLE   Vital Signs:  Patient profile:   77 year old male Weight:      172 pounds BMI:     22.77 Temp:     98.0 degrees F oral Pulse rate:   60 / minute Pulse rhythm:   regular BP sitting:   124 / 70  (left arm) Cuff size:   regular  Vitals Entered By: Sydell Axon LPN (December 14, 2009 8:52 AM) CC: Pain in lower stomach   History of Present Illness: Pt having R inguinal discomfort. He has had a mildly large LEFT hydrocele for many years and has a small right inguinal hernia. He has needed to go to brief underwear for support  to help strain and now is having fairly regular discomfort in the right inguinal ligament distribution. He would like to avoid surgery if possible. He asks about a truss.  Problems Prior to Update: 1)  Health Maintenance Exam  (ICD-V70.0) 2)  Special Screening Malignant Neoplasm of Prostate  (ICD-V76.44) 3)  Hypercholesterolemia  (ICD-272.0) 4)  Screening For Malignannt Neoplasm, Site Nec  (ICD-V76.49) 5)  Backache Nos  (ICD-724.5) 6)  Elevated Prostate Specific Antigen  (ICD-790.93) 7)  Block, Rbb/left Ant Fascicular Block  (ICD-426.52) 8)  Dermatitis, Contact, Nos  (ICD-692.9) 9)  Plantar Fasciitis, Bilateral  (ICD-728.71) 10)  Hydrocele, Left  (ICD-603.9) 11)  Erectile Dysfunction, Mild  (ICD-302.72)  Medications Prior to Update: 1)  Viagra 100 Mg Tabs (Sildenafil Citrate) .... One Tab By Mouth One Hour Prior 2)  Temovate E 0.05 % Crea (Clobetasol Prop Emollient Base) .... Use On Area Two Times A Day Never On Face.  Allergies: 1)  ! * Lincocin 2)  ! * Lincocin  Physical Exam  General:  Well-developed,well-nourished,in no acute distress; alert,appropriate and cooperative throughout examination Head:  Normocephalic and atraumatic without obvious abnormalities. No apparent alopecia or balding. Eyes:  Conjunctiva clear bilaterally.  Genitalia:  Testes bilaterally descended withoutnodularity or tenderness. No scrotal masses  but for a large hydrocele of the left scrotum. No other  lesions. No penis lesions or urethral discharge. Small right inguinal hernia, feels indirect.   Impression & Recommendations:  Problem # 1:  HYDROCELE, LEFT (ICD-603.9) With right inguinal pain and small right inguinal hernia. Refer to Urology for assessment. He would like to avoid sugery and is willing to try a truss if possible. Orders: Urology Referral (Urology)  Complete Medication List: 1)  Viagra 100 Mg Tabs (Sildenafil citrate) .... One tab by mouth one hour prior 2)  Temovate E 0.05 % Crea (Clobetasol prop emollient base) .... Use on area two times a day never on face.  Patient Instructions: 1)  Refer to Urology.  Current Allergies (reviewed today): ! * LINCOCIN ! * LINCOCIN

## 2011-01-01 ENCOUNTER — Encounter: Payer: Self-pay | Admitting: Family Medicine

## 2011-02-14 ENCOUNTER — Other Ambulatory Visit: Payer: Self-pay | Admitting: Family Medicine

## 2011-02-14 DIAGNOSIS — R972 Elevated prostate specific antigen [PSA]: Secondary | ICD-10-CM

## 2011-02-14 DIAGNOSIS — E7800 Pure hypercholesterolemia, unspecified: Secondary | ICD-10-CM

## 2011-02-14 DIAGNOSIS — M549 Dorsalgia, unspecified: Secondary | ICD-10-CM

## 2011-02-14 DIAGNOSIS — I452 Bifascicular block: Secondary | ICD-10-CM

## 2011-02-18 ENCOUNTER — Other Ambulatory Visit (INDEPENDENT_AMBULATORY_CARE_PROVIDER_SITE_OTHER): Payer: Medicare Other | Admitting: Family Medicine

## 2011-02-18 DIAGNOSIS — E7800 Pure hypercholesterolemia, unspecified: Secondary | ICD-10-CM

## 2011-02-18 DIAGNOSIS — M549 Dorsalgia, unspecified: Secondary | ICD-10-CM

## 2011-02-18 DIAGNOSIS — R972 Elevated prostate specific antigen [PSA]: Secondary | ICD-10-CM

## 2011-02-18 DIAGNOSIS — I452 Bifascicular block: Secondary | ICD-10-CM

## 2011-02-18 LAB — BASIC METABOLIC PANEL
BUN: 12 mg/dL (ref 6–23)
Calcium: 8.7 mg/dL (ref 8.4–10.5)
GFR: 62.13 mL/min (ref >60.00)
Glucose, Bld: 83 mg/dL (ref 70–99)
Potassium: 4.3 mEq/L (ref 3.5–5.1)

## 2011-02-18 LAB — LIPID PANEL
HDL: 36.9 mg/dL — ABNORMAL LOW (ref >39.00)
LDL Cholesterol: 114 mg/dL — ABNORMAL HIGH (ref 0–99)
Total CHOL/HDL Ratio: 5
Triglycerides: 84 mg/dL (ref 0.0–149.0)

## 2011-02-18 LAB — HEPATIC FUNCTION PANEL
Bilirubin, Direct: 0.1 mg/dL (ref 0.0–0.3)
Total Bilirubin: 0.7 mg/dL (ref 0.3–1.2)

## 2011-02-18 LAB — CBC WITH DIFFERENTIAL/PLATELET
Basophils Absolute: 0 10*3/uL (ref 0.0–0.1)
Eosinophils Absolute: 0.1 10*3/uL (ref 0.0–0.7)
HCT: 42.8 % (ref 39.0–52.0)
Lymphs Abs: 1.2 10*3/uL (ref 0.7–4.0)
MCHC: 34.8 g/dL (ref 30.0–36.0)
Monocytes Absolute: 0.5 10*3/uL (ref 0.1–1.0)
Monocytes Relative: 10.4 % (ref 3.0–12.0)
Platelets: 89 10*3/uL — ABNORMAL LOW (ref 150.0–400.0)
RDW: 13.3 % (ref 11.5–14.6)

## 2011-02-18 NOTE — Progress Notes (Signed)
Addended byMills Koller on: 02/18/2011 09:49 AM   Modules accepted: Orders

## 2011-02-21 ENCOUNTER — Ambulatory Visit (INDEPENDENT_AMBULATORY_CARE_PROVIDER_SITE_OTHER): Payer: Medicare Other | Admitting: Family Medicine

## 2011-02-21 ENCOUNTER — Encounter: Payer: Self-pay | Admitting: Family Medicine

## 2011-02-21 DIAGNOSIS — R972 Elevated prostate specific antigen [PSA]: Secondary | ICD-10-CM

## 2011-02-21 DIAGNOSIS — N433 Hydrocele, unspecified: Secondary | ICD-10-CM

## 2011-02-21 DIAGNOSIS — I452 Bifascicular block: Secondary | ICD-10-CM

## 2011-02-21 DIAGNOSIS — D696 Thrombocytopenia, unspecified: Secondary | ICD-10-CM | POA: Insufficient documentation

## 2011-02-21 DIAGNOSIS — F528 Other sexual dysfunction not due to a substance or known physiological condition: Secondary | ICD-10-CM

## 2011-02-21 DIAGNOSIS — E7800 Pure hypercholesterolemia, unspecified: Secondary | ICD-10-CM

## 2011-02-21 DIAGNOSIS — M549 Dorsalgia, unspecified: Secondary | ICD-10-CM

## 2011-02-21 HISTORY — DX: Thrombocytopenia, unspecified: D69.6

## 2011-02-21 NOTE — Assessment & Plan Note (Signed)
RRR today. No splitting heard.

## 2011-02-21 NOTE — Progress Notes (Signed)
  Subjective:    Patient ID: Dylan [name], male    DOB: 06/22/1934, 77 y.o.   MRN: [id number]  HPI Pt here for Comp Exam. He is having some pain in the left latissimus area. He declines muscle relaxer.   Review of Systems  Constitutional: Negative for fever, chills, diaphoresis, appetite change, fatigue and unexpected weight change.  HENT: Negative for hearing loss, ear pain, tinnitus and ear discharge.   Eyes: Negative for pain, discharge and visual disturbance.  Respiratory: Negative for cough, shortness of breath and wheezing.   Cardiovascular: Negative for chest pain and palpitations.       No SOB w/ exertion  Gastrointestinal: Negative for nausea, vomiting, abdominal pain, diarrhea, constipation and blood in stool.       No heartburn or swallowing problems.  Genitourinary: Negative for dysuria, frequency and difficulty urinating.       No nocturia but slower.  Musculoskeletal: Negative for myalgias and arthralgias. Back pain: L flank pain   Skin: Negative for rash.       No itching or dryness.  Neurological: Negative for tremors and numbness.       No tingling or balance problems.  Hematological: Negative for adenopathy. Does not bruise/bleed easily.  Psychiatric/Behavioral: Negative for dysphoric mood and agitation.       Objective:   Physical Exam  Constitutional: He is oriented to person, place, and time. He appears well-developed and well-nourished. No distress.  HENT:  Head: Normocephalic and atraumatic.  Right Ear: External ear normal.  Left Ear: External ear normal.  Nose: Nose normal.  Mouth/Throat: Oropharynx is clear and moist.  Eyes: Conjunctivae and EOM are normal. Pupils are equal, round, and reactive to light. Right eye exhibits no discharge. Left eye exhibits no discharge. No scleral icterus.  Neck: Normal range of motion. Neck supple. No thyromegaly present.  Cardiovascular: Normal rate, regular rhythm, normal heart sounds and intact distal pulses.   No  murmur heard. Pulmonary/Chest: Effort normal and breath sounds normal. No respiratory distress. He has no wheezes.  Abdominal: Soft. Bowel sounds are normal. He exhibits no distension and no mass. There is no tenderness. There is no rebound and no guarding.  Genitourinary: Rectum normal, prostate normal and penis normal. Guaiac negative stool.       Signif hydrocele on left, unchanged. Prostate 20-30gms.  Musculoskeletal: Normal range of motion. He exhibits no edema and no tenderness.       No tenderness to palpation and no swelling of left latissimus but slight discomfort with ROM.  Lymphadenopathy:    He has no cervical adenopathy.  Neurological: He is alert and oriented to person, place, and time. Coordination normal.  Skin: Skin is warm and dry. No rash noted. He is not diaphoretic.  Psychiatric: He has a normal mood and affect. His behavior is normal. Judgment and thought content normal.          Assessment & Plan:

## 2011-02-21 NOTE — Assessment & Plan Note (Signed)
Chol ok. HDL very slightly low.  Lab Results  Component Value Date   CHOL 168 02/18/2011   CHOL 171 02/13/2010   CHOL 162 02/07/2009   Lab Results  Component Value Date   HDL 36.90* 02/18/2011   HDL 54.09* 02/13/2010   HDL 81.19* 02/07/2009   Lab Results  Component Value Date   LDLCALC 114* 02/18/2011   LDLCALC 114* 02/13/2010   LDLCALC 111* 02/07/2009   Lab Results  Component Value Date   TRIG 84.0 02/18/2011   TRIG 97.0 02/13/2010   TRIG 102.0 02/07/2009   Lab Results  Component Value Date   CHOLHDL 5 02/18/2011   CHOLHDL 5 02/13/2010   CHOLHDL 5 02/07/2009

## 2011-02-21 NOTE — Assessment & Plan Note (Signed)
Slightly lower this year but no other risks /problems I can ascertain. Will continue to follow. If drops again next year, will refer to hematology. Poss a marrow problem but other two cell line are fine. Will follow another 12 mos. Other health is great.

## 2011-02-21 NOTE — Patient Instructions (Signed)
RTC as needed

## 2011-02-21 NOTE — Assessment & Plan Note (Signed)
Stable, Still using Viagra successfully.

## 2011-02-21 NOTE — Assessment & Plan Note (Signed)
Unchanged. No problems and longstanding.

## 2011-02-21 NOTE — Assessment & Plan Note (Signed)
New ache today in the left flank. Typically in the latissimus area. Discussed heat and ice. Declines musc relaxer.

## 2011-02-21 NOTE — Assessment & Plan Note (Signed)
Has slowed its progress. Now reasonably stable. Cont to follow.

## 2014-12-13 DIAGNOSIS — C44112 Basal cell carcinoma of skin of right eyelid, including canthus: Secondary | ICD-10-CM | POA: Diagnosis not present

## 2014-12-13 DIAGNOSIS — D485 Neoplasm of uncertain behavior of skin: Secondary | ICD-10-CM | POA: Diagnosis not present

## 2014-12-13 DIAGNOSIS — C44329 Squamous cell carcinoma of skin of other parts of face: Secondary | ICD-10-CM | POA: Diagnosis not present

## 2014-12-14 DIAGNOSIS — H0289 Other specified disorders of eyelid: Secondary | ICD-10-CM | POA: Insufficient documentation

## 2014-12-14 DIAGNOSIS — C44112 Basal cell carcinoma of skin of right eyelid, including canthus: Secondary | ICD-10-CM | POA: Diagnosis not present

## 2014-12-14 DIAGNOSIS — C44101 Unspecified malignant neoplasm of skin of unspecified eyelid, including canthus: Secondary | ICD-10-CM | POA: Insufficient documentation

## 2014-12-14 DIAGNOSIS — C44311 Basal cell carcinoma of skin of nose: Secondary | ICD-10-CM | POA: Diagnosis not present

## 2014-12-14 DIAGNOSIS — M950 Acquired deformity of nose: Secondary | ICD-10-CM | POA: Diagnosis not present

## 2014-12-14 HISTORY — DX: Other specified disorders of eyelid: H02.89

## 2014-12-14 HISTORY — DX: Unspecified malignant neoplasm of skin of unspecified eyelid, including canthus: C44.101

## 2014-12-15 DIAGNOSIS — C44311 Basal cell carcinoma of skin of nose: Secondary | ICD-10-CM

## 2014-12-15 DIAGNOSIS — M950 Acquired deformity of nose: Secondary | ICD-10-CM | POA: Insufficient documentation

## 2014-12-15 DIAGNOSIS — H0469 Other changes of lacrimal passages: Secondary | ICD-10-CM | POA: Insufficient documentation

## 2014-12-15 HISTORY — DX: Basal cell carcinoma of skin of nose: C44.311

## 2014-12-16 DIAGNOSIS — C44112 Basal cell carcinoma of skin of right eyelid, including canthus: Secondary | ICD-10-CM | POA: Diagnosis not present

## 2014-12-16 DIAGNOSIS — C44311 Basal cell carcinoma of skin of nose: Secondary | ICD-10-CM | POA: Diagnosis not present

## 2014-12-16 DIAGNOSIS — H0469 Other changes of lacrimal passages: Secondary | ICD-10-CM | POA: Diagnosis not present

## 2014-12-16 DIAGNOSIS — M950 Acquired deformity of nose: Secondary | ICD-10-CM | POA: Diagnosis not present

## 2014-12-16 DIAGNOSIS — I10 Essential (primary) hypertension: Secondary | ICD-10-CM | POA: Diagnosis not present

## 2014-12-16 DIAGNOSIS — H0289 Other specified disorders of eyelid: Secondary | ICD-10-CM | POA: Diagnosis not present

## 2015-01-10 HISTORY — PX: BASAL CELL CARCINOMA EXCISION: SHX1214

## 2015-01-24 DIAGNOSIS — C44321 Squamous cell carcinoma of skin of nose: Secondary | ICD-10-CM | POA: Diagnosis not present

## 2015-02-22 DIAGNOSIS — L821 Other seborrheic keratosis: Secondary | ICD-10-CM | POA: Diagnosis not present

## 2015-02-22 DIAGNOSIS — Z85828 Personal history of other malignant neoplasm of skin: Secondary | ICD-10-CM | POA: Diagnosis not present

## 2015-02-22 DIAGNOSIS — L820 Inflamed seborrheic keratosis: Secondary | ICD-10-CM | POA: Diagnosis not present

## 2015-02-22 DIAGNOSIS — Z08 Encounter for follow-up examination after completed treatment for malignant neoplasm: Secondary | ICD-10-CM | POA: Diagnosis not present

## 2015-05-03 DIAGNOSIS — M950 Acquired deformity of nose: Secondary | ICD-10-CM | POA: Diagnosis not present

## 2015-05-03 DIAGNOSIS — H02112 Cicatricial ectropion of right lower eyelid: Secondary | ICD-10-CM | POA: Diagnosis not present

## 2015-05-03 DIAGNOSIS — H0469 Other changes of lacrimal passages: Secondary | ICD-10-CM | POA: Diagnosis not present

## 2015-05-03 DIAGNOSIS — C44311 Basal cell carcinoma of skin of nose: Secondary | ICD-10-CM | POA: Diagnosis not present

## 2015-05-03 DIAGNOSIS — H02119 Cicatricial ectropion of unspecified eye, unspecified eyelid: Secondary | ICD-10-CM

## 2015-05-03 DIAGNOSIS — H0289 Other specified disorders of eyelid: Secondary | ICD-10-CM | POA: Diagnosis not present

## 2015-05-03 HISTORY — DX: Cicatricial ectropion of unspecified eye, unspecified eyelid: H02.119

## 2015-05-17 DIAGNOSIS — H1811 Bullous keratopathy, right eye: Secondary | ICD-10-CM | POA: Diagnosis not present

## 2015-05-17 DIAGNOSIS — H16211 Exposure keratoconjunctivitis, right eye: Secondary | ICD-10-CM | POA: Insufficient documentation

## 2015-05-17 DIAGNOSIS — H04521 Eversion of right lacrimal punctum: Secondary | ICD-10-CM

## 2015-05-17 DIAGNOSIS — H04221 Epiphora due to insufficient drainage, right lacrimal gland: Secondary | ICD-10-CM | POA: Diagnosis not present

## 2015-05-17 DIAGNOSIS — Z79899 Other long term (current) drug therapy: Secondary | ICD-10-CM | POA: Diagnosis not present

## 2015-05-17 DIAGNOSIS — H02112 Cicatricial ectropion of right lower eyelid: Secondary | ICD-10-CM | POA: Diagnosis not present

## 2015-05-17 DIAGNOSIS — Z85828 Personal history of other malignant neoplasm of skin: Secondary | ICD-10-CM | POA: Diagnosis not present

## 2015-05-17 DIAGNOSIS — Z87891 Personal history of nicotine dependence: Secondary | ICD-10-CM | POA: Diagnosis not present

## 2015-05-17 HISTORY — DX: Eversion of right lacrimal punctum: H04.521

## 2015-05-17 HISTORY — DX: Exposure keratoconjunctivitis, right eye: H16.211

## 2015-07-18 DIAGNOSIS — L989 Disorder of the skin and subcutaneous tissue, unspecified: Secondary | ICD-10-CM | POA: Insufficient documentation

## 2015-07-18 DIAGNOSIS — L918 Other hypertrophic disorders of the skin: Secondary | ICD-10-CM | POA: Diagnosis not present

## 2015-07-18 DIAGNOSIS — H029 Unspecified disorder of eyelid: Secondary | ICD-10-CM | POA: Insufficient documentation

## 2015-07-18 DIAGNOSIS — D2311 Other benign neoplasm of skin of right eyelid, including canthus: Secondary | ICD-10-CM | POA: Diagnosis not present

## 2015-08-23 DIAGNOSIS — D225 Melanocytic nevi of trunk: Secondary | ICD-10-CM | POA: Diagnosis not present

## 2015-08-23 DIAGNOSIS — Z85828 Personal history of other malignant neoplasm of skin: Secondary | ICD-10-CM | POA: Diagnosis not present

## 2015-08-23 DIAGNOSIS — L814 Other melanin hyperpigmentation: Secondary | ICD-10-CM | POA: Diagnosis not present

## 2015-08-23 DIAGNOSIS — L570 Actinic keratosis: Secondary | ICD-10-CM | POA: Diagnosis not present

## 2015-08-23 DIAGNOSIS — Z08 Encounter for follow-up examination after completed treatment for malignant neoplasm: Secondary | ICD-10-CM | POA: Diagnosis not present

## 2015-10-11 DIAGNOSIS — M950 Acquired deformity of nose: Secondary | ICD-10-CM | POA: Diagnosis not present

## 2015-10-11 DIAGNOSIS — H04551 Acquired stenosis of right nasolacrimal duct: Secondary | ICD-10-CM | POA: Diagnosis not present

## 2015-10-11 DIAGNOSIS — H0469 Other changes of lacrimal passages: Secondary | ICD-10-CM | POA: Diagnosis not present

## 2015-10-11 DIAGNOSIS — H04221 Epiphora due to insufficient drainage, right lacrimal gland: Secondary | ICD-10-CM | POA: Diagnosis not present

## 2015-10-11 DIAGNOSIS — H02112 Cicatricial ectropion of right lower eyelid: Secondary | ICD-10-CM | POA: Diagnosis not present

## 2015-10-16 DIAGNOSIS — M2420 Disorder of ligament, unspecified site: Secondary | ICD-10-CM | POA: Insufficient documentation

## 2015-10-16 DIAGNOSIS — H04551 Acquired stenosis of right nasolacrimal duct: Secondary | ICD-10-CM

## 2015-10-16 HISTORY — DX: Acquired stenosis of right nasolacrimal duct: H04.551

## 2015-12-15 DIAGNOSIS — H0259 Other disorders affecting eyelid function: Secondary | ICD-10-CM | POA: Insufficient documentation

## 2015-12-22 DIAGNOSIS — J342 Deviated nasal septum: Secondary | ICD-10-CM | POA: Diagnosis not present

## 2015-12-22 DIAGNOSIS — Z888 Allergy status to other drugs, medicaments and biological substances status: Secondary | ICD-10-CM | POA: Diagnosis not present

## 2015-12-22 DIAGNOSIS — M950 Acquired deformity of nose: Secondary | ICD-10-CM | POA: Diagnosis not present

## 2015-12-22 DIAGNOSIS — C44112 Basal cell carcinoma of skin of right eyelid, including canthus: Secondary | ICD-10-CM | POA: Diagnosis not present

## 2015-12-22 DIAGNOSIS — H04112 Dacryops of left lacrimal gland: Secondary | ICD-10-CM | POA: Diagnosis not present

## 2015-12-22 DIAGNOSIS — H02112 Cicatricial ectropion of right lower eyelid: Secondary | ICD-10-CM | POA: Diagnosis not present

## 2015-12-22 DIAGNOSIS — H04551 Acquired stenosis of right nasolacrimal duct: Secondary | ICD-10-CM | POA: Diagnosis not present

## 2015-12-22 DIAGNOSIS — H04521 Eversion of right lacrimal punctum: Secondary | ICD-10-CM | POA: Diagnosis not present

## 2015-12-22 DIAGNOSIS — Z87891 Personal history of nicotine dependence: Secondary | ICD-10-CM | POA: Diagnosis not present

## 2015-12-22 DIAGNOSIS — H0469 Other changes of lacrimal passages: Secondary | ICD-10-CM | POA: Diagnosis not present

## 2015-12-22 DIAGNOSIS — H04221 Epiphora due to insufficient drainage, right lacrimal gland: Secondary | ICD-10-CM | POA: Diagnosis not present

## 2015-12-22 DIAGNOSIS — Z428 Encounter for other plastic and reconstructive surgery following medical procedure or healed injury: Secondary | ICD-10-CM | POA: Diagnosis not present

## 2015-12-22 DIAGNOSIS — Z79891 Long term (current) use of opiate analgesic: Secondary | ICD-10-CM | POA: Diagnosis not present

## 2016-02-21 DIAGNOSIS — Z85828 Personal history of other malignant neoplasm of skin: Secondary | ICD-10-CM | POA: Diagnosis not present

## 2016-02-21 DIAGNOSIS — L853 Xerosis cutis: Secondary | ICD-10-CM | POA: Diagnosis not present

## 2016-02-21 DIAGNOSIS — L821 Other seborrheic keratosis: Secondary | ICD-10-CM | POA: Diagnosis not present

## 2016-02-21 DIAGNOSIS — L570 Actinic keratosis: Secondary | ICD-10-CM | POA: Diagnosis not present

## 2016-04-11 DIAGNOSIS — H02112 Cicatricial ectropion of right lower eyelid: Secondary | ICD-10-CM | POA: Diagnosis not present

## 2016-04-11 DIAGNOSIS — H04551 Acquired stenosis of right nasolacrimal duct: Secondary | ICD-10-CM | POA: Diagnosis not present

## 2016-04-11 DIAGNOSIS — H04221 Epiphora due to insufficient drainage, right lacrimal gland: Secondary | ICD-10-CM | POA: Diagnosis not present

## 2016-04-11 DIAGNOSIS — M2420 Disorder of ligament, unspecified site: Secondary | ICD-10-CM | POA: Diagnosis not present

## 2016-04-11 DIAGNOSIS — M950 Acquired deformity of nose: Secondary | ICD-10-CM | POA: Diagnosis not present

## 2016-05-06 DIAGNOSIS — H04551 Acquired stenosis of right nasolacrimal duct: Secondary | ICD-10-CM | POA: Diagnosis not present

## 2016-05-06 DIAGNOSIS — H0289 Other specified disorders of eyelid: Secondary | ICD-10-CM | POA: Diagnosis not present

## 2016-05-06 DIAGNOSIS — H04221 Epiphora due to insufficient drainage, right lacrimal gland: Secondary | ICD-10-CM | POA: Diagnosis not present

## 2016-08-01 DIAGNOSIS — H2513 Age-related nuclear cataract, bilateral: Secondary | ICD-10-CM | POA: Diagnosis not present

## 2016-08-22 DIAGNOSIS — Z85828 Personal history of other malignant neoplasm of skin: Secondary | ICD-10-CM | POA: Diagnosis not present

## 2016-08-22 DIAGNOSIS — L820 Inflamed seborrheic keratosis: Secondary | ICD-10-CM | POA: Diagnosis not present

## 2017-01-23 DIAGNOSIS — H25093 Other age-related incipient cataract, bilateral: Secondary | ICD-10-CM | POA: Diagnosis not present

## 2017-01-23 DIAGNOSIS — H52223 Regular astigmatism, bilateral: Secondary | ICD-10-CM | POA: Diagnosis not present

## 2017-01-23 DIAGNOSIS — H2523 Age-related cataract, morgagnian type, bilateral: Secondary | ICD-10-CM | POA: Diagnosis not present

## 2017-01-23 DIAGNOSIS — H5203 Hypermetropia, bilateral: Secondary | ICD-10-CM | POA: Diagnosis not present

## 2017-02-05 DIAGNOSIS — H2513 Age-related nuclear cataract, bilateral: Secondary | ICD-10-CM | POA: Diagnosis not present

## 2017-03-05 DIAGNOSIS — H0289 Other specified disorders of eyelid: Secondary | ICD-10-CM | POA: Diagnosis not present

## 2017-03-05 DIAGNOSIS — H2511 Age-related nuclear cataract, right eye: Secondary | ICD-10-CM | POA: Diagnosis not present

## 2017-03-05 DIAGNOSIS — Z881 Allergy status to other antibiotic agents status: Secondary | ICD-10-CM | POA: Diagnosis not present

## 2017-03-05 DIAGNOSIS — H2513 Age-related nuclear cataract, bilateral: Secondary | ICD-10-CM | POA: Diagnosis not present

## 2017-03-05 DIAGNOSIS — H04551 Acquired stenosis of right nasolacrimal duct: Secondary | ICD-10-CM | POA: Diagnosis not present

## 2017-03-05 DIAGNOSIS — C44112 Basal cell carcinoma of skin of right eyelid, including canthus: Secondary | ICD-10-CM | POA: Diagnosis not present

## 2017-03-05 DIAGNOSIS — Z79899 Other long term (current) drug therapy: Secondary | ICD-10-CM | POA: Diagnosis not present

## 2017-03-05 DIAGNOSIS — H04221 Epiphora due to insufficient drainage, right lacrimal gland: Secondary | ICD-10-CM | POA: Diagnosis not present

## 2017-03-05 DIAGNOSIS — H029 Unspecified disorder of eyelid: Secondary | ICD-10-CM | POA: Diagnosis not present

## 2017-03-05 DIAGNOSIS — H02112 Cicatricial ectropion of right lower eyelid: Secondary | ICD-10-CM | POA: Diagnosis not present

## 2017-03-05 DIAGNOSIS — L989 Disorder of the skin and subcutaneous tissue, unspecified: Secondary | ICD-10-CM | POA: Diagnosis not present

## 2017-03-06 DIAGNOSIS — Z9849 Cataract extraction status, unspecified eye: Secondary | ICD-10-CM

## 2017-03-06 HISTORY — DX: Cataract extraction status, unspecified eye: Z98.49

## 2017-03-11 HISTORY — PX: CATARACT EXTRACTION: SUR2

## 2017-04-01 DIAGNOSIS — H2512 Age-related nuclear cataract, left eye: Secondary | ICD-10-CM | POA: Insufficient documentation

## 2017-04-01 HISTORY — DX: Age-related nuclear cataract, left eye: H25.12

## 2017-04-02 DIAGNOSIS — H2512 Age-related nuclear cataract, left eye: Secondary | ICD-10-CM | POA: Diagnosis not present

## 2017-04-02 DIAGNOSIS — Z961 Presence of intraocular lens: Secondary | ICD-10-CM | POA: Diagnosis not present

## 2017-04-02 DIAGNOSIS — Z85828 Personal history of other malignant neoplasm of skin: Secondary | ICD-10-CM | POA: Diagnosis not present

## 2017-04-02 DIAGNOSIS — Z9841 Cataract extraction status, right eye: Secondary | ICD-10-CM | POA: Diagnosis not present

## 2017-04-04 ENCOUNTER — Encounter: Payer: Self-pay | Admitting: Family Medicine

## 2017-08-25 DIAGNOSIS — L821 Other seborrheic keratosis: Secondary | ICD-10-CM | POA: Diagnosis not present

## 2017-08-25 DIAGNOSIS — D1801 Hemangioma of skin and subcutaneous tissue: Secondary | ICD-10-CM | POA: Diagnosis not present

## 2017-08-25 DIAGNOSIS — L918 Other hypertrophic disorders of the skin: Secondary | ICD-10-CM | POA: Diagnosis not present

## 2017-08-25 DIAGNOSIS — Z85828 Personal history of other malignant neoplasm of skin: Secondary | ICD-10-CM | POA: Diagnosis not present

## 2018-02-23 DIAGNOSIS — L820 Inflamed seborrheic keratosis: Secondary | ICD-10-CM | POA: Diagnosis not present

## 2018-02-23 DIAGNOSIS — L570 Actinic keratosis: Secondary | ICD-10-CM | POA: Diagnosis not present

## 2018-02-23 DIAGNOSIS — Z85828 Personal history of other malignant neoplasm of skin: Secondary | ICD-10-CM | POA: Diagnosis not present

## 2018-02-23 DIAGNOSIS — L578 Other skin changes due to chronic exposure to nonionizing radiation: Secondary | ICD-10-CM | POA: Diagnosis not present

## 2018-02-23 DIAGNOSIS — D225 Melanocytic nevi of trunk: Secondary | ICD-10-CM | POA: Diagnosis not present

## 2018-05-09 DIAGNOSIS — S0990XA Unspecified injury of head, initial encounter: Secondary | ICD-10-CM | POA: Diagnosis not present

## 2018-05-09 DIAGNOSIS — W19XXXA Unspecified fall, initial encounter: Secondary | ICD-10-CM | POA: Diagnosis not present

## 2018-05-09 DIAGNOSIS — G20 Parkinson's disease: Secondary | ICD-10-CM | POA: Diagnosis not present

## 2018-05-09 DIAGNOSIS — S0181XA Laceration without foreign body of other part of head, initial encounter: Secondary | ICD-10-CM | POA: Diagnosis not present

## 2018-05-11 ENCOUNTER — Telehealth: Payer: Self-pay

## 2018-05-11 NOTE — Telephone Encounter (Signed)
I will forward this to Dr. Danise Mina for his consideration.  Patient has not re-established since Dr. Council Mechanic left.  His wife is currently your patient and he would like to know if you would accept him?  In ER over the weekend after a fall at [facility].  Needs follow up.  Please Advise.  Thanks.

## 2018-05-11 NOTE — Telephone Encounter (Signed)
Copied from [place] [id number]. Topic: Appointment Scheduling - Scheduling Inquiry for Clinic >> May 11, 2018  9:14 AM Scherrie Gerlach wrote: Reason for CRM: wife, Dylan [name], called to ask if Dylan [name] will accept her husband , the pt, as a pt. Wife sees Dylan [name]. Pt used to see Dylan [name], but never established with anyone else. Pt fell over the weekend, went to the ED in [facility]. Pt advised to follow up with his PCP, but he does not have one. Wife states pt is complaining of his wrist.

## 2018-05-11 NOTE — Telephone Encounter (Signed)
Notified patient's wife, actually there were 2 (30 minute openings) next Tuesday 7/9.  Patient has been scheduled for NP appointment and paperwork is in process of being mailed out.  Patient knows to arrive at least 41mins ahead of appointment time and to bring completed paperwork with him.  Wife also made aware that if patient needs to be seen for any concerns s/p fall over the weekend, then he is to go to urgent care between now and appointment.  Wife verbalizes understanding and thanks Dr. Darnell Level for working with them on this.

## 2018-05-11 NOTE — Telephone Encounter (Signed)
Last seen here 7 yrs ago. I will take as new patient, but may not be able to get in acutely.  May place in open 30 min slot over next few months. If worsening wrist pain, may need to see urgent care for this while he re establishes.

## 2018-05-19 ENCOUNTER — Ambulatory Visit (INDEPENDENT_AMBULATORY_CARE_PROVIDER_SITE_OTHER)
Admission: RE | Admit: 2018-05-19 | Discharge: 2018-05-19 | Disposition: A | Discharge status: Home / Self Care | Payer: Medicare Other | Source: Ambulatory Visit | Attending: Family Medicine | Admitting: Family Medicine

## 2018-05-19 ENCOUNTER — Ambulatory Visit: Payer: Medicare Other | Admitting: Family Medicine

## 2018-05-19 ENCOUNTER — Encounter: Payer: Self-pay | Admitting: Family Medicine

## 2018-05-19 VITALS — BP 112/70 | HR 65 | Temp 97.9°F | Ht 71.5 in | Wt 175.0 lb

## 2018-05-19 DIAGNOSIS — E7800 Pure hypercholesterolemia, unspecified: Secondary | ICD-10-CM

## 2018-05-19 DIAGNOSIS — I452 Bifascicular block: Secondary | ICD-10-CM

## 2018-05-19 DIAGNOSIS — G3184 Mild cognitive impairment, so stated: Secondary | ICD-10-CM | POA: Insufficient documentation

## 2018-05-19 DIAGNOSIS — S6991XA Unspecified injury of right wrist, hand and finger(s), initial encounter: Secondary | ICD-10-CM | POA: Insufficient documentation

## 2018-05-19 DIAGNOSIS — D696 Thrombocytopenia, unspecified: Secondary | ICD-10-CM | POA: Diagnosis not present

## 2018-05-19 DIAGNOSIS — R413 Other amnesia: Secondary | ICD-10-CM | POA: Diagnosis not present

## 2018-05-19 DIAGNOSIS — E559 Vitamin D deficiency, unspecified: Secondary | ICD-10-CM | POA: Diagnosis not present

## 2018-05-19 DIAGNOSIS — F03918 Unspecified dementia, unspecified severity, with other behavioral disturbance: Secondary | ICD-10-CM | POA: Insufficient documentation

## 2018-05-19 DIAGNOSIS — F0390 Unspecified dementia without behavioral disturbance: Secondary | ICD-10-CM | POA: Insufficient documentation

## 2018-05-19 DIAGNOSIS — R972 Elevated prostate specific antigen [PSA]: Secondary | ICD-10-CM | POA: Diagnosis not present

## 2018-05-19 DIAGNOSIS — F03911 Unspecified dementia, unspecified severity, with agitation: Secondary | ICD-10-CM | POA: Insufficient documentation

## 2018-05-19 DIAGNOSIS — M25531 Pain in right wrist: Secondary | ICD-10-CM | POA: Diagnosis not present

## 2018-05-19 DIAGNOSIS — W19XXXA Unspecified fall, initial encounter: Secondary | ICD-10-CM | POA: Diagnosis not present

## 2018-05-19 DIAGNOSIS — F0391 Unspecified dementia with behavioral disturbance: Secondary | ICD-10-CM | POA: Insufficient documentation

## 2018-05-19 HISTORY — DX: Vitamin D deficiency, unspecified: E55.9

## 2018-05-19 NOTE — Patient Instructions (Addendum)
Good to meet you today! Xray of right wrist today - looking ok. You had a wrist sprain - continue wrist brace until fully healed (likely 3-4 weeks).  Return at your convenience for medicare wellness visit. You will meet with me for a physical and may meet Katha Cabal our wellness nurse.

## 2018-05-19 NOTE — Assessment & Plan Note (Signed)
H/o this, last checked 89 (2012). Will recheck at CPE.

## 2018-05-19 NOTE — Assessment & Plan Note (Signed)
Will need rechecked next labs

## 2018-05-19 NOTE — Assessment & Plan Note (Addendum)
[place] Forehead laceration has healed remarkably well. No signs of concussion. Anticipate good recovery. Head CT reviewed and imported into our system.

## 2018-05-19 NOTE — Assessment & Plan Note (Signed)
Wife somewhat concerned about this. Recommended memory testing when returns for AMW.

## 2018-05-19 NOTE — Assessment & Plan Note (Signed)
Scaphoid tenderness- check films r/o scaphoid fracture - clear on my read. Will await radiology over read.

## 2018-05-19 NOTE — Assessment & Plan Note (Signed)
Update EKG next office visit.

## 2018-05-19 NOTE — Assessment & Plan Note (Signed)
Very mild in the past. Will reassess at f/u

## 2018-05-19 NOTE — Progress Notes (Signed)
BP 112/70 (BP Location: Left Arm, Patient Position: Sitting, Cuff Size: Normal)   Pulse 65   Temp 97.9 F (36.6 C) (Oral)   Ht 5' 11.5" (1.816 m)   Wt 175 lb (79.4 kg)   SpO2 98%   BMI 24.07 kg/m    CC: new pt to establish care Subjective:    Patient ID: Dylan [name], male    DOB: [birth year], 84 y.o.   MRN: [id number]  HPI: Dylan [name] is a 84 y.o. male presenting on 05/19/2018 for New Patient (Needs to re-establish care.) and Hospitalization Follow-up (Pt was seen at [facility] ER on 05/09/18 due to a fall. Pt provided CD of head CT. Pt is accompanied by wife. )   Prior saw Dr Council Mechanic. Last seen 2012.  DOI: 05/09/2018  Tripped going up steps while shopping, landed on wrist and hit R forehead. Seen at [facility] ER over weekend. Had forehead laceration repaired with skin glue. Did not have hand evaluated - was not hurting at the time. A few days later noted right wrist/forearm tenderness and swelling and treated with wrist brace.   Head CT scan was normal.   Denies dizziness, syncope, headache. Feels well. No confusion, vision changes or trouble reading.  Relevant past medical, surgical, family and social history reviewed and updated as indicated. Interim medical history since our last visit reviewed. Allergies and medications reviewed and updated. Outpatient Medications Prior to Visit  Medication Sig Dispense Refill  . clobetasol (TEMOVATE) 0.05 % cream Apply topically 2 (two) times daily. Never on face     . Liniments (BIOFREEZE EX) Apply topically. As needed     . sildenafil (VIAGRA) 100 MG tablet One tablet by mouth one hour prior      No facility-administered medications prior to visit.      Per HPI unless specifically indicated in ROS section below Review of Systems     Objective:    BP 112/70 (BP Location: Left Arm, Patient Position: Sitting, Cuff Size: Normal)   Pulse 65   Temp 97.9 F (36.6 C) (Oral)   Ht 5' 11.5" (1.816 m)   Wt 175  lb (79.4 kg)   SpO2 98%   BMI 24.07 kg/m   Wt Readings from Last 3 Encounters:  05/19/18 175 lb (79.4 kg)  02/21/11 171 lb (77.6 kg)  05/17/98 172 lb (78 kg)    Physical Exam  Constitutional: He appears well-developed and well-nourished.  HENT:  Head: Normocephalic and atraumatic.  Mouth/Throat: Oropharynx is clear and moist. No oropharyngeal exudate.  Eyes: Pupils are equal, round, and reactive to light. Conjunctivae and EOM are normal.  R eye easily tears after reconstructive surgery  Cardiovascular: Normal rate and normal heart sounds. A regularly irregular rhythm present.  No murmur heard. Pulmonary/Chest: Effort normal and breath sounds normal. No respiratory distress. He has no wheezes. He has no rales.  Musculoskeletal: He exhibits no edema.  2+ rad pulses bilaterally R arm exam: FROM at R elbow and wrist No tenderness to palpation along radius or ulna No pian at 1st [facility] tender at right anatomical snuff box  Skin: Skin is warm and dry. Bruising (mild to R forearm) noted. No rash noted.  Healed laceration R forehead  Nursing note and vitals reviewed.  Lab Results  Component Value Date   WBC 4.6 02/18/2011   HGB 14.9 02/18/2011   HCT 42.8 02/18/2011   MCV 96.1 02/18/2011   PLT 89.0 (L) 02/18/2011  Lab Results  Component Value Date   PSA 2.21 02/18/2011   PSA 2.64 02/13/2010   PSA 2.11 02/07/2009       Assessment & Plan:   Problem List Items Addressed This Visit    Vitamin D deficiency    Will need rechecked next labs      Thrombocytopenia [facility])    H/o this, last checked 89 (2012). Will recheck at CPE.       Right bundle branch block with left anterior fascicular block    Update EKG next office visit.       Memory deficit    Wife somewhat concerned about this. Recommended memory testing when returns for AMW.       Injury of right wrist    Scaphoid tenderness- check films r/o scaphoid fracture - clear on my read. Will await radiology over  read.       Relevant Orders   DG Wrist Complete Right   HYPERCHOLESTEROLEMIA    Very mild in the past. Will reassess at f/u      Fall - Primary    [place] Forehead laceration has healed remarkably well. No signs of concussion. Anticipate good recovery. Head CT reviewed and imported into our system.       RESOLVED: ELEVATED PROSTATE SPECIFIC ANTIGEN    Never truly elevated. Reviewed prior trend. Latest PSA 2.2. Will resolve.           No orders of the defined types were placed in this encounter.  Orders Placed This Encounter  Procedures  . DG Wrist Complete Right    Standing Status:   Future    Number of Occurrences:   1    Standing Expiration Date:   07/21/2019    Order Specific Question:   Reason for Exam (SYMPTOM  OR DIAGNOSIS REQUIRED)    Answer:   R wrist pain at scaphoid after fall 1 wk ago    Order Specific Question:   Preferred imaging location?    Answer:   [facility]    Order Specific Question:   Radiology Contrast Protocol - do NOT remove file path    Answer:   [email or web address]    Follow up plan: Return in about 3 months (around 08/19/2018) for annual exam, prior fasting for blood work, medicare wellness visit.  [name], MD

## 2018-05-19 NOTE — Assessment & Plan Note (Signed)
Never truly elevated. Reviewed prior trend. Latest PSA 2.2. Will resolve.

## 2018-06-16 ENCOUNTER — Emergency Department (HOSPITAL_COMMUNITY)
Admission: EM | Admit: 2018-06-16 | Discharge: 2018-06-16 | Disposition: A | Discharge status: Home / Self Care | Payer: Medicare Other | Attending: Emergency Medicine | Admitting: Emergency Medicine

## 2018-06-16 ENCOUNTER — Ambulatory Visit (HOSPITAL_COMMUNITY): Admission: EM | Admit: 2018-06-16 | Discharge: 2018-06-16 | Payer: Medicare Other | Source: Home / Self Care

## 2018-06-16 ENCOUNTER — Other Ambulatory Visit: Payer: Self-pay

## 2018-06-16 ENCOUNTER — Ambulatory Visit: Payer: Self-pay | Admitting: Family Medicine

## 2018-06-16 ENCOUNTER — Encounter (HOSPITAL_COMMUNITY): Payer: Self-pay

## 2018-06-16 DIAGNOSIS — Z87891 Personal history of nicotine dependence: Secondary | ICD-10-CM | POA: Insufficient documentation

## 2018-06-16 DIAGNOSIS — R55 Syncope and collapse: Secondary | ICD-10-CM | POA: Insufficient documentation

## 2018-06-16 DIAGNOSIS — R9431 Abnormal electrocardiogram [ECG] [EKG]: Secondary | ICD-10-CM | POA: Diagnosis not present

## 2018-06-16 LAB — CBC
HCT: 43.3 % (ref 39.0–52.0)
Hemoglobin: 14.9 g/dL (ref 13.0–17.0)
MCH: 32.4 pg (ref 26.0–34.0)
MCHC: 34.4 g/dL (ref 30.0–36.0)
MCV: 94.1 fL (ref 78.0–100.0)
Platelets: 117 10*3/uL — ABNORMAL LOW (ref 150–400)
RBC: 4.6 MIL/uL (ref 4.22–5.81)
RDW: 12.5 % (ref 11.5–15.5)
WBC: 7.4 10*3/uL (ref 4.0–10.5)

## 2018-06-16 LAB — BASIC METABOLIC PANEL
ANION GAP: 9 (ref 5–15)
BUN: 7 mg/dL — ABNORMAL LOW (ref 8–23)
CALCIUM: 8.7 mg/dL — AB (ref 8.9–10.3)
CO2: 27 mmol/L (ref 22–32)
Chloride: 105 mmol/L (ref 98–111)
Creatinine, Ser: 1.26 mg/dL — ABNORMAL HIGH (ref 0.61–1.24)
GFR, EST AFRICAN AMERICAN: 60 mL/min — AB (ref >60)
GFR, EST NON AFRICAN AMERICAN: 51 mL/min — AB (ref >60)
Glucose, Bld: 98 mg/dL (ref 70–99)
Potassium: 3.7 mmol/L (ref 3.5–5.1)
Sodium: 141 mmol/L (ref 135–145)

## 2018-06-16 LAB — URINALYSIS, ROUTINE W REFLEX MICROSCOPIC
Bilirubin Urine: NEGATIVE
Glucose, UA: NEGATIVE mg/dL
Hgb urine dipstick: NEGATIVE
Ketones, ur: NEGATIVE mg/dL
LEUKOCYTES UA: NEGATIVE
NITRITE: NEGATIVE
Protein, ur: NEGATIVE mg/dL
SPECIFIC GRAVITY, URINE: 1.005 (ref 1.005–1.030)
pH: 6 (ref 5.0–8.0)

## 2018-06-16 LAB — CBG MONITORING, ED: GLUCOSE-CAPILLARY: 140 mg/dL — AB (ref 70–99)

## 2018-06-16 LAB — I-STAT TROPONIN, ED: TROPONIN I, POC: 0 ng/mL (ref 0.00–0.08)

## 2018-06-16 MED ORDER — SODIUM CHLORIDE 0.9 % IV BOLUS
500.0000 mL | Freq: Once | INTRAVENOUS | Status: AC
  Administered 2018-06-16: 500 mL via INTRAVENOUS

## 2018-06-16 NOTE — ED Notes (Addendum)
Patient ambulated with steady gait. Patient stated that he felt fine and denies dizziness. Abigail, PA has been notified.

## 2018-06-16 NOTE — ED Notes (Signed)
GOT PATIENT UNDRESS ON THE MONITOR PATIENT IS RESTING WITH CALL BELL IN REACH AND FAMILY AT BEDSIDE

## 2018-06-16 NOTE — Discharge Instructions (Addendum)
These follow-up very closely with your primary care physician.  You may need cardiology follow-up and cardiac event monitoring.  Please return immediately to the emergency department for the following reasons: You have a severe headache. You have unusual pain in your chest, abdomen, or back. You are bleeding from your mouth or rectum, or you have black or tarry stool. You have a very fast or irregular heartbeat (palpitations). You faint once or repeatedly. You have a seizure. You are confused. You have trouble walking. You have severe weakness. You have vision problems.

## 2018-06-16 NOTE — Telephone Encounter (Signed)
I returned call to wife who called in for husband.   She put husband on the phone then she put me on speaker phone so that both of them were talking with me.    He is not on any medications or has any diagnosed illnesses.    He has not seen a doctor for 10 years.   He has an appt to be established with Dr. Danise Mina in September.    I have referred him to the [facility] because his wife said they would not go to the [facility] in [place], [place].    She is a aware of the [facility] so they are going there to be evaluated due to no availability at [facility].     Reason for Disposition . [1] MODERATE dizziness (e.g., interferes with normal activities) AND [2] has NOT been evaluated by physician for this  (Exception: dizziness caused by heat exposure, sudden standing, or poor fluid intake)  Answer Assessment - Initial Assessment Questions 1. DESCRIPTION: "Describe your dizziness."     Standing in line and all of a sudden I just felt weak and could not stand up.   I went all the way down to the floor.   I was at the [employer] in line.    Not a diabetic.   I felt hot and weak and went down on the floor.    No injuries.   Someone helped him drop to the floor.    I feel fine now.   After he went to the floor it took a few moments for him come back around.   He was very pale.   People in the restaurant helped him.    CNA there checked his BP and pulse which were fine.   After about 15 minutes he was ok and then ate supper there at the restaurant. 2. LIGHTHEADED: "Do you feel lightheaded?" (e.g., somewhat faint, woozy, weak upon standing)     See above.     He has not been seen by a doctor for 10 years.    He is to see Dr. Danise Mina.     Wife on phone now.    This also happened a year ago in line at the [employer].         I know he ate breakfast and a small lunch which is his typical.        He worked outside yesterday.    He may have been dehydrated. 3. VERTIGO: "Do you feel like either you or  the room is spinning or tilting?" (i.e. vertigo)     No 4. SEVERITY: "How bad is it?"  "Do you feel like you are going to faint?" "Can you stand and walk?"   - MILD - walking normally   - MODERATE - interferes with normal activities (e.g., work, school)    - SEVERE - unable to stand, requires support to walk, feels like passing out now.      See above 5. ONSET:  "When did the dizziness begin?"     Yesterday about 6:00PM.      This morning he is fine.  6. AGGRAVATING FACTORS: "Does anything make it worse?" (e.g., standing, change in head position)     No.   It just happened last night 7. HEART RATE: "Can you tell me your heart rate?" "How many beats in 15 seconds?"  (Note: not all patients can do this)       Pulse  was ok per CNA in restaurant  that checked him. 8. CAUSE: "What do you think is causing the dizziness?"     His clothing was soaking wet when he did this yesterday.    I don't have any idea. 9. RECURRENT SYMPTOM: "Have you had dizziness before?" If so, ask: "When was the last time?" "What happened that time?"     A year ago the exact same thing happened in line at the [employer].   He didn't go all the way to the floor then.    10. OTHER SYMPTOMS: "Do you have any other symptoms?" (e.g., fever, chest pain, vomiting, diarrhea, bleeding)       No recent illness. 11. PREGNANCY: "Is there any chance you are pregnant?" "When was your last menstrual period?"       N/A  Protocols used: DIZZINESS [facility]

## 2018-06-16 NOTE — ED Triage Notes (Signed)
Pt brought in by family who reports last night at dinner he stated he was going to pass out. Pt became pale, diaphoretic. No LOC. Pt has had 2 of these episodes in the last year. Pt has not been seen by an MD in 10 years. Pt denies any chest pain at the time of the event.

## 2018-06-16 NOTE — ED Provider Notes (Signed)
[facility] EMERGENCY DEPARTMENT Provider Note   CSN: [id number] Arrival date & time: 06/16/18  1131     History   Chief Complaint Chief Complaint  Patient presents with  . Near Syncope    HPI Dylan [name] is a 84 y.o. male who presents the emergency department chief complaint of near syncope.  History is gathered by the patient and his wife who is at bedside.  She states that today they were both at [employer].Patient states that he was standing in line when he "knew I was going down."  His wife states that he grabbed the railing in front of him and state did "I feel really funny and I am going down."  She was able to grab his jacket and a bystander eased him down to the ground.  He never lost consciousness fully but his wife states that he became gray and diaphoretic and looked as if he was going to pass out for about 4 5 minutes.  The patient states it wore off and then he was able to get up and walk out.  He has had one previous episode like this about 1 year ago at [employer] however it did not last that long and he was able to sit down in a chair before he needed to be sat on the ground.  The patient denies chest pain, shortness of breath, racing or skipping in his heart.  He denies recent volume loss from vomiting or diarrhea.  He has not had any fevers or chills.  He denies melena or hematochezia.  His wife states that he does not take any medicine and does not see doctors and is scheduled for his first physical in the past 10 years and about a week or 2.  She states that he has had decreased appetite and thinks he may have had decreased fluid intake as well.  He is otherwise been healthy and active and at his baseline mental status. HPI  Past Medical History:  Diagnosis Date  . Cellulitis    due to nail impaction thru boot L  . Hyperlipidemia 12/1999    Patient Active Problem List   Diagnosis Date Noted  . Vitamin D deficiency 05/19/2018  . Fall  05/19/2018  . Injury of right wrist 05/19/2018  . Memory deficit 05/19/2018  . Thrombocytopenia ([place]) 02/21/2011  . HYPERCHOLESTEROLEMIA 02/04/2008  . 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 ERECTILE DYSFUNCTION, MILD 02/03/2007  . HYDROCELE, LEFT 02/03/2007    Past Surgical History:  Procedure Laterality Date  . BASAL CELL CARCINOMA EXCISION Right 01/2015   with reconstructive surgery around eye  . CATARACT EXTRACTION Left 03/2017  . 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 KNEE ARTHROSCOPY  1993   Right  . KNEE ARTHROSCOPY  01/2001   Left        Home Medications    Prior to Admission medications   Not on File    Family History Family History  Problem Relation Age of Onset  . Heart disease Mother        CHF  . Heart disease Father        CHF  . Stroke Father 26  . Leukemia Brother     Social History Social History   Tobacco Use  . Smoking status: Former Smoker    Types: Cigarettes    Last attempt to quit: 11/11/1960    Years  since quitting: 57.6  . Smokeless tobacco: Never Used  Substance Use Topics  . Alcohol use: No  . Drug use: No     Allergies   Lincomycin hcl   Review of Systems Review of Systems Ten systems reviewed and are negative for acute change, except as noted in the HPI.    Physical Exam Updated Vital Signs BP 124/70   Pulse (!) 57   Temp 98.6 F (37 C) (Oral)   Resp 19   SpO2 99%   Physical Exam  Constitutional: He appears well-developed and well-nourished. No distress.  HENT:  Head: Normocephalic and atraumatic.  Eyes: Conjunctivae are normal. No scleral icterus.  Neck: Normal range of motion. Neck supple.  Cardiovascular: Normal rate, regular rhythm, normal heart sounds and intact distal pulses.  Distant heart sounds  Pulmonary/Chest: Effort normal and breath sounds normal. No respiratory distress.  Abdominal: Soft. There is no tenderness.  Musculoskeletal: He  exhibits no edema.  Neurological: He is alert.  Skin: Skin is warm and dry. He is not diaphoretic.  Psychiatric: His behavior is normal.  Nursing note and vitals reviewed.    ED Treatments / Results  Labs (all labs ordered are listed, but only abnormal results are displayed) Labs Reviewed  BASIC METABOLIC PANEL - Abnormal; Notable for the following components:      Result Value   BUN 7 (*)    Creatinine, Ser 1.26 (*)    Calcium 8.7 (*)    GFR calc non Af Amer 51 (*)    GFR calc Af Amer 60 (*)    All other components within normal limits  CBC - Abnormal; Notable for the following components:   Platelets 117 (*)    All other components within normal limits  CBG MONITORING, ED - Abnormal; Notable for the following components:   Glucose-Capillary 140 (*)    All other components within normal limits  URINALYSIS, ROUTINE W REFLEX MICROSCOPIC  I-STAT TROPONIN, ED    EKG EKG Interpretation  Date/Time:  Tuesday June 16 2018 11:40:05 EDT Ventricular Rate:  73 PR Interval:    QRS Duration: 150 QT Interval:  408 QTC Calculation: 449 R Axis:   -84 Text Interpretation:  Normal sinus rhythm with sinus arrhythmia Right bundle branch block Left anterior fascicular block  Bifascicular block  Possible Lateral infarct , age undetermined Abnormal ECG Confirmed by Gerlene Fee [phone]) on 06/16/2018 2:21:38 PM   Radiology No results found.  Procedures Procedures (including critical care time)  Medications Ordered in ED Medications  sodium chloride 0.9 % bolus 500 mL (has no administration in time range)     Initial Impression / Assessment and Plan / ED Course  I have reviewed the triage vital signs and the nursing notes.  Pertinent labs & imaging results that were available during my care of the patient were reviewed by me and considered in my medical decision making (see chart for details).  Clinical Course as of Jun 16 1550  Tue Jun 16, 2018  1444 Review of EMR shows that  the patient has a hx of RBBB and LAFB on EKG as of 02/04/2007. I am unable to visualize the old EKG, however today's ekg appears to be seimilar   [AH]    Clinical Course User Index [AH] Margarita Mail, PA-C    Dylan [name] is a 84 y.o. male who presents to ED for near syncope evaluation of syncopal episode just prior to arrival.    On exam, patient is afebrile, hemodynamically  stable with no focal neuro deficits and benign cardiopulmonary exam. EKG Reviewed and it seems it is unchanged since 2008. Labs show elevated glucose, low platelet count which appears to be chronic, and only mild elevation in his serum creatinine suggestive of some mild dehydration. Patient with  no personal or family history of sudden, young cardiac death.  No hx of CHF.  No complaints of chest pain or shortness of breath. Low risk per [facility] Syncope Rule.   Evaluation does not show pathology that would require ongoing emergent intervention or inpatient treatment. Will discharge to home with close  follow up. Reasons to return to ER were discussed.   Pt has remained hemodynamically stable throughout their time in the ED.   BP 117/67   Pulse 62   Temp 98.6 F (37 C) (Oral)   Resp (!) 21   SpO2 100%   Patient understands return precautions and follow up care. All questions answered.    Final Clinical Impressions(s) / ED Diagnoses   Final diagnoses:  Near syncope    ED Discharge Orders    None      Margarita Mail, PA-C 06/16/18 2152  Maudie Flakes, MD 06/16/18 2156

## 2018-06-22 ENCOUNTER — Encounter: Payer: Self-pay | Admitting: Family Medicine

## 2018-06-22 ENCOUNTER — Ambulatory Visit: Payer: Medicare Other | Admitting: Family Medicine

## 2018-06-22 VITALS — BP 100/60 | HR 67 | Temp 97.9°F | Ht 71.5 in | Wt 177.0 lb

## 2018-06-22 DIAGNOSIS — R55 Syncope and collapse: Secondary | ICD-10-CM

## 2018-06-22 DIAGNOSIS — I452 Bifascicular block: Secondary | ICD-10-CM

## 2018-06-22 HISTORY — DX: Syncope and collapse: R55

## 2018-06-22 LAB — TSH: TSH: 1.36 u[IU]/mL (ref 0.35–4.50)

## 2018-06-22 NOTE — Assessment & Plan Note (Addendum)
?  orthostasis related. Orthostatic vital signs negative today.  ?hypoglycemia - may have skipped lunch that day - encouraged regular scheduled meals.  EKG showed RBBB with LAFB, possible etiology - pending cardiology appt later this week.  Update TSH today - endorses cold intolerance as well.  Will need MMSE next month at CPE.

## 2018-06-22 NOTE — Patient Instructions (Signed)
Check thyroid today. Orthostatic vital signs today EKG showed blockage of electrical activity of the heart, but seemed chronic. Keep cardiology appointment for Thursday. Good to see you today, keep next month's physical appointment.

## 2018-06-22 NOTE — Progress Notes (Signed)
BP 100/60 (BP Location: Left Arm, Patient Position: Sitting, Cuff Size: Normal)   Pulse 67   Temp 97.9 F (36.6 C) (Oral)   Ht 5' 11.5" (1.816 m)   Wt 177 lb (80.3 kg)   SpO2 98%   BMI 24.34 kg/m    Orthostatic VS for the past 24 hrs (Last 3 readings):  BP- Lying BP- Standing at 0 minutes  06/22/18 1100 - 104/78  06/22/18 1058 100/70 -    CC: ER f/u visit Subjective:    Patient ID: Dylan [name], male    DOB: 06/09/1934, 84 y.o.   MRN: [id number]  HPI: Dylan [name] is a 84 y.o. male presenting on 06/22/2018 for Hospitalization Follow-up (Seen at [facility] ED on 06/16/18, primary dx Near syncope.  Pt accompanied by his wife, [name].)   Here with wife.  Recent ER visit last week for near syncope - while at [employer] for supper. Never fully lost consciousness. He did eat and felt better. Seen at [facility] ER the next morning, records reviewed. EKG showed RBBB, LAFB ?unchanged since 2008. No old EKG in system to compare. He does mention he skipped lunch that day. He was referred to cardiologist in [facility] Dr Bettina Gavia this Thursday.   Denies dizziness, lightheadedness prior. Denies chest pain or palpitations. No headaches, vision changes, slurred speech.   Relevant past medical, surgical, family and social history reviewed and updated as indicated. Interim medical history since our last visit reviewed. Allergies and medications reviewed and updated. No outpatient medications prior to visit.   No facility-administered medications prior to visit.      Per HPI unless specifically indicated in ROS section below Review of Systems     Objective:    BP 100/60 (BP Location: Left Arm, Patient Position: Sitting, Cuff Size: Normal)   Pulse 67   Temp 97.9 F (36.6 C) (Oral)   Ht 5' 11.5" (1.816 m)   Wt 177 lb (80.3 kg)   SpO2 98%   BMI 24.34 kg/m   Wt Readings from Last 3 Encounters:  06/22/18 177 lb (80.3 kg)  05/19/18 175 lb (79.4 kg)  02/21/11 171 lb (77.6 kg)    Physical Exam    Constitutional: He appears well-developed and well-nourished. No distress.  HENT:  Head: Normocephalic and atraumatic.  Mouth/Throat: Oropharynx is clear and moist. No oropharyngeal exudate.  Eyes: Pupils are equal, round, and reactive to light. Conjunctivae and EOM are normal.  Neck: Normal range of motion.  Cardiovascular: Normal rate, regular rhythm and normal heart sounds.  No murmur heard. Pulmonary/Chest: Effort normal and breath sounds normal. No respiratory distress. He has no wheezes. He has no rales.  Musculoskeletal: He exhibits no edema.  Skin: No rash noted.  Psychiatric: He has a normal mood and affect.  Nursing note and vitals reviewed.  Results for orders placed or performed during the hospital encounter of 25/05/39  Basic metabolic panel  Result Value Ref Range   Sodium 141 135 - 145 mmol/L   Potassium 3.7 3.5 - 5.1 mmol/L   Chloride 105 98 - 111 mmol/L   CO2 27 22 - 32 mmol/L   Glucose, Bld 98 70 - 99 mg/dL   BUN 7 (L) 8 - 23 mg/dL   Creatinine, Ser 1.26 (H) 0.61 - 1.24 mg/dL   Calcium 8.7 (L) 8.9 - 10.3 mg/dL   GFR calc non Af Amer 51 (L) >60 mL/min   GFR calc Af Amer 60 (L) >60 mL/min   Anion gap  9 5 - 15  CBC  Result Value Ref Range   WBC 7.4 4.0 - 10.5 K/uL   RBC 4.60 4.22 - 5.81 MIL/uL   Hemoglobin 14.9 13.0 - 17.0 g/dL   HCT 43.3 39.0 - 52.0 %   MCV 94.1 78.0 - 100.0 fL   MCH 32.4 26.0 - 34.0 pg   MCHC 34.4 30.0 - 36.0 g/dL   RDW 12.5 11.5 - 15.5 %   Platelets 117 (L) 150 - 400 K/uL  Urinalysis, Routine w reflex microscopic  Result Value Ref Range   Color, Urine YELLOW YELLOW   APPearance CLEAR CLEAR   Specific Gravity, Urine 1.005 1.005 - 1.030   pH 6.0 5.0 - 8.0   Glucose, UA NEGATIVE NEGATIVE mg/dL   Hgb urine dipstick NEGATIVE NEGATIVE   Bilirubin Urine NEGATIVE NEGATIVE   Ketones, ur NEGATIVE NEGATIVE mg/dL   Protein, ur NEGATIVE NEGATIVE mg/dL   Nitrite NEGATIVE NEGATIVE   Leukocytes, UA NEGATIVE NEGATIVE  CBG monitoring, ED  Result  Value Ref Range   Glucose-Capillary 140 (H) 70 - 99 mg/dL  I-stat troponin, ED  Result Value Ref Range   Troponin i, poc 0.00 0.00 - 0.08 ng/mL   Comment 3           Lab Results  Component Value Date   TSH 2.03 02/18/2011      Assessment & Plan:   Problem List Items Addressed This Visit    Right bundle branch block with left anterior fascicular block    Pending cards appt.      Pre-syncope - Primary    ?orthostasis related. Orthostatic vital signs negative today.  ?hypoglycemia - may have skipped lunch that day - encouraged regular scheduled meals.  EKG showed RBBB with LAFB, possible etiology - pending cardiology appt later this week.  Update TSH today - endorses cold intolerance as well.  Will need MMSE next month at CPE.       Relevant Orders   TSH       No orders of the defined types were placed in this encounter.  Orders Placed This Encounter  Procedures  . TSH    Follow up plan: Return if symptoms worsen or fail to improve.  [name], MD

## 2018-06-22 NOTE — Assessment & Plan Note (Signed)
Pending cards appt.

## 2018-06-24 NOTE — Progress Notes (Signed)
Cardiology Office Note:    Date:  06/25/2018   ID:  Dylan [name], DOB 1934/01/07, MRN [id number]  PCP:  Dylan Bush, MD  Cardiologist:  Dylan More, MD   Referring MD: Dylan Bush, MD  ASSESSMENT:    1. Syncope and collapse   2. Right bundle branch block with left anterior fascicular block    PLAN:    In order of problems listed above:  Fortunately his wife is present I am confident that indeed he had syncope.  I advised him to be seen by EP for pacemaker and he will not accept that.  He will have a ZIO monitor patch for the next 14 days and if unrevealing and implanted loop recorder and agrees not to drive the car until seen by back in the office.  Hopefully he will change his mind.  Will avoid any medications with heart rate slowing affects and his wife was present and participated and witnessed the discussion. Next appointment 6 weeks   Medication Adjustments/Labs and Tests Ordered: Current medicines are reviewed at length with the patient today.  Concerns regarding medicines are outlined above.  Orders Placed This Encounter  Procedures  . LONG TERM MONITOR (3-14 DAYS)  . EKG 12-Lead   No orders of the defined types were placed in this encounter.    Chief Complaint  Patient presents with  . New Patient (Initial Visit)    History of Present Illness:    Dylan [name] is a 84 y.o. male with bifascicular RBBB LAHB  block who is being seen today for the evaluation of syncope at the request of Dylan Bush, MD. Review of EMR shows that the patient has a hx of RBBB and LAFB on EKG as of 02/04/2007   He recently was in a restaurant became profoundly weak went to the ground he adamantly denies losing consciousness his wife said that he was unresponsive he did not have a seizure or urinary incontinence but it took him about 15 minutes to recover and a bystander in the medical field said that his heart rate was slow in the range of 50 when they felt pulse during  the recovery phase.  Interesting one year prior in the same restaurant had an episode very much like this but did not lose consciousness.  Last month he was with his wife was behind her at the time became very weak fell to the ground had trauma to the wrist but did not have a fracture he also had a forehead laceration.  His wife is very insistent that she observe the last episode and that he fulfilled criteria for syncope.  He prides himself on being healthy takes no medications and has had no illnesses such as hypertension heart disease or seizure.  Afterwards he was not confused and had no incontinence.  His EKG shows bifascicular heart block.  Past Medical History:  Diagnosis Date  . Cellulitis    due to nail impaction thru boot L  . Hyperlipidemia 12/1999    Past Surgical History:  Procedure Laterality Date  . BASAL CELL CARCINOMA EXCISION Right 01/2015   with reconstructive surgery around eye  . CATARACT EXTRACTION Left 03/2017  . 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 KNEE ARTHROSCOPY  1993   Right  . KNEE ARTHROSCOPY  01/2001   Left    Current Medications: No outpatient medications have been marked as taking for the 06/25/18 encounter (Office Visit) with Richardo Priest, MD.  Allergies:   Lincomycin hcl   Social History   Socioeconomic History  . Marital status: Married    Spouse name: Not on file  . Number of children: 3  . Years of education: Not on file  . Highest education level: Not on file  Occupational History  . 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 Financial resource strain: Not on file  . Food insecurity:    Worry: Not on file    Inability: Not on file  . Transportation needs:    Medical: Not on file    Non-medical: Not on file  Tobacco Use  . Smoking status: Former Smoker    Types: Cigarettes    Last attempt to quit: 11/11/1960    Years since quitting: 57.6    . Smokeless tobacco: Never Used  Substance and Sexual Activity  . Alcohol use: No  . Drug use: No  . Sexual activity: Not on file  Lifestyle  . Physical activity:    Days per week: Not on file    Minutes per session: Not on file  . Stress: Not on file  Relationships  . Social connections:    Talks on phone: Not on file    Gets together: Not on file    Attends religious service: Not on file    Active member of club or organization: Not on file    Attends meetings of clubs or organizations: Not on file    Relationship status: Not on file  Other Topics Concern  . 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  ROS:   Review of Systems  Constitution: Negative.  HENT: Negative.   Eyes: Negative.   Cardiovascular: Positive for syncope.  Respiratory: Negative.   Endocrine: Negative.   Hematologic/Lymphatic: Negative.   Skin: Negative.   Musculoskeletal: Negative.   Gastrointestinal: Negative.   Genitourinary: Negative.   Neurological: Positive for dizziness.  Psychiatric/Behavioral: Negative.   Allergic/Immunologic: Negative.    Please see the history of present illness.     All other systems reviewed and are negative.  EKGs/Labs/Other Studies Reviewed:    The following studies were reviewed today: I reviewed the emergency room records and independently reviewed the EKG prior to the visit  EKG:  EKG is  ordered today.  The ekg ordered today demonstrates similar pattern bifascicular heart block he does not have first-degree AV block associated with ekg 06/16/18 [place] rbbb lahb Recent Labs: 06/16/2018: BUN 7; Creatinine, Ser 1.26; Hemoglobin 14.9; Platelets 117; Potassium 3.7; Sodium  141 06/22/2018: TSH 1.36  Recent Lipid Panel    Component Value Date/Time   CHOL 168 02/18/2011 0815   TRIG 84.0 02/18/2011 0815   HDL 36.90 (L) 02/18/2011 0815   CHOLHDL 5 02/18/2011 0815   VLDL 16.8 02/18/2011 0815   LDLCALC 114 (H) 02/18/2011 0815    Physical Exam:    VS:  BP 108/64 (BP Location: Left Arm, Patient Position: Sitting, Cuff Size: Normal)   Pulse 60   Ht 5' 11.5" (1.816 m)   Wt 177 lb (80.3 kg)   SpO2 98%   BMI 24.34 kg/m  Wt Readings from Last 3 Encounters:  06/25/18 177 lb (80.3 kg)  06/22/18 177 lb (80.3 kg)  05/19/18 175 lb (79.4 kg)     GEN:  Well nourished, well developed in no acute distress HEENT: Normal NECK: No JVD; No carotid bruits LYMPHATICS: No lymphadenopathy CARDIAC: RRR, no murmurs, rubs, gallops RESPIRATORY:  Clear to auscultation without rales, wheezing or rhonchi  ABDOMEN: Soft, non-tender, non-distended MUSCULOSKELETAL:  No edema; No deformity  SKIN: Warm and dry NEUROLOGIC:  Alert and oriented x 3 PSYCHIATRIC:  Normal affect     Signed, Dylan More, MD  06/25/2018 10:49 AM    [place]

## 2018-06-25 ENCOUNTER — Encounter: Payer: Self-pay | Admitting: Cardiology

## 2018-06-25 ENCOUNTER — Ambulatory Visit: Payer: Medicare Other | Admitting: Cardiology

## 2018-06-25 VITALS — BP 108/64 | HR 60 | Ht 71.5 in | Wt 177.0 lb

## 2018-06-25 DIAGNOSIS — I452 Bifascicular block: Secondary | ICD-10-CM | POA: Diagnosis not present

## 2018-06-25 DIAGNOSIS — R55 Syncope and collapse: Secondary | ICD-10-CM

## 2018-06-25 NOTE — Patient Instructions (Addendum)
Medication Instructions:  Your physician recommends that you continue on your current medications as directed. Please refer to the Current Medication list given to you today.   Labwork: NONE  Testing/Procedures: You had an EKG today  Your physician has recommended that you wear a ZIO patch.  Zio patches are medical devices that record the heart's electrical activity. Doctors most often use these monitors to diagnose arrhythmias. Arrhythmias are problems with the speed or rhythm of the heartbeat. The monitor is a small, portable device. You can wear one while you do your normal daily activities. This is usually used to diagnose what is causing palpitations/syncope (passing out).    Follow-Up: Your physician recommends that you schedule a follow-up appointment in: 6 weeks    Any Other Special Instructions Will Be Listed Below (If Applicable).     If you need a refill on your cardiac medications before your next appointment, please call your pharmacy.     Syncope Syncope is when you lose temporarily pass out (faint). Signs that you may be about to pass out include:  Feeling dizzy or light-headed.  Feeling sick to your stomach (nauseous).  Seeing all white or all black.  Having cold, clammy skin.  If you passed out, get help right away. Call your local emergency services (911 in the [place] Do not drive yourself to the hospital. Follow these instructions at home: Pay attention to any changes in your symptoms. Take these actions to help with your condition:  Have someone stay with you until you feel stable.  Do not drive, use machinery, or play sports until your doctor says it is okay.  Keep all follow-up visits as told by your doctor. This is important.  If you start to feel like you might pass out, lie down right away and raise (elevate) your feet above the level of your heart. Breathe deeply and steadily. Wait until all of the symptoms are gone.  Drink enough fluid to  keep your pee (urine) clear or pale yellow.  If you are taking blood pressure or heart medicine, get up slowly and spend many minutes getting ready to sit and then stand. This can help with dizziness.  Take over-the-counter and prescription medicines only as told by your doctor.  Get help right away if:  You have a very bad headache.  You have unusual pain in your chest, tummy, or back.  You are bleeding from your mouth or rectum.  You have black or tarry poop (stool).  You have a very fast or uneven heartbeat (palpitations).  It hurts to breathe.  You pass out once or more than once.  You have jerky movements that you cannot control (seizure).  You are confused.  You have trouble walking.  You are very weak.  You have vision problems. These symptoms may be an emergency. Do not wait to see if the symptoms will go away. Get medical help right away. Call your local emergency services (911 in the [place] Do not drive yourself to the hospital. This information is not intended to replace advice given to you by your health care provider. Make sure you discuss any questions you have with your health care provider. Document Released: 04/15/2008 Document Revised: 04/04/2016 Document Reviewed: 07/12/2015 Elsevier Interactive Patient Education  [employer].

## 2018-06-29 ENCOUNTER — Other Ambulatory Visit: Payer: Medicare Other

## 2018-06-29 DIAGNOSIS — R55 Syncope and collapse: Secondary | ICD-10-CM | POA: Diagnosis not present

## 2018-07-02 DIAGNOSIS — Z9842 Cataract extraction status, left eye: Secondary | ICD-10-CM | POA: Diagnosis not present

## 2018-07-02 DIAGNOSIS — H0102B Squamous blepharitis left eye, upper and lower eyelids: Secondary | ICD-10-CM | POA: Diagnosis not present

## 2018-07-02 DIAGNOSIS — H5212 Myopia, left eye: Secondary | ICD-10-CM | POA: Diagnosis not present

## 2018-07-02 DIAGNOSIS — H0102A Squamous blepharitis right eye, upper and lower eyelids: Secondary | ICD-10-CM | POA: Diagnosis not present

## 2018-07-02 DIAGNOSIS — H0288B Meibomian gland dysfunction left eye, upper and lower eyelids: Secondary | ICD-10-CM | POA: Diagnosis not present

## 2018-07-02 DIAGNOSIS — Z9841 Cataract extraction status, right eye: Secondary | ICD-10-CM | POA: Diagnosis not present

## 2018-07-18 DIAGNOSIS — R55 Syncope and collapse: Secondary | ICD-10-CM | POA: Diagnosis not present

## 2018-07-28 ENCOUNTER — Other Ambulatory Visit: Payer: Self-pay | Admitting: Family Medicine

## 2018-07-28 DIAGNOSIS — D696 Thrombocytopenia, unspecified: Secondary | ICD-10-CM

## 2018-07-28 DIAGNOSIS — E7800 Pure hypercholesterolemia, unspecified: Secondary | ICD-10-CM

## 2018-07-28 DIAGNOSIS — R413 Other amnesia: Secondary | ICD-10-CM

## 2018-07-28 DIAGNOSIS — E559 Vitamin D deficiency, unspecified: Secondary | ICD-10-CM

## 2018-07-29 ENCOUNTER — Ambulatory Visit (INDEPENDENT_AMBULATORY_CARE_PROVIDER_SITE_OTHER): Payer: Medicare Other

## 2018-07-29 VITALS — BP 110/70 | HR 64 | Temp 98.4°F | Ht 71.25 in | Wt 178.5 lb

## 2018-07-29 DIAGNOSIS — Z0000 Encounter for general adult medical examination without abnormal findings: Secondary | ICD-10-CM

## 2018-07-29 DIAGNOSIS — E7800 Pure hypercholesterolemia, unspecified: Secondary | ICD-10-CM

## 2018-07-29 DIAGNOSIS — E559 Vitamin D deficiency, unspecified: Secondary | ICD-10-CM

## 2018-07-29 DIAGNOSIS — D696 Thrombocytopenia, unspecified: Secondary | ICD-10-CM | POA: Diagnosis not present

## 2018-07-29 DIAGNOSIS — R413 Other amnesia: Secondary | ICD-10-CM | POA: Diagnosis not present

## 2018-07-29 LAB — COMPREHENSIVE METABOLIC PANEL
ALBUMIN: 4.2 g/dL (ref 3.5–5.2)
ALK PHOS: 81 U/L (ref 39–117)
ALT: 16 U/L (ref 0–53)
AST: 18 U/L (ref 0–37)
BUN: 12 mg/dL (ref 6–23)
CALCIUM: 9.5 mg/dL (ref 8.4–10.5)
CO2: 32 mEq/L (ref 19–32)
CREATININE: 1.22 mg/dL (ref 0.40–1.50)
Chloride: 102 mEq/L (ref 96–112)
GFR: 60.37 mL/min (ref >60.00)
Glucose, Bld: 97 mg/dL (ref 70–99)
Potassium: 4.5 mEq/L (ref 3.5–5.1)
Sodium: 139 mEq/L (ref 135–145)
Total Bilirubin: 0.6 mg/dL (ref 0.2–1.2)
Total Protein: 6.9 g/dL (ref 6.0–8.3)

## 2018-07-29 LAB — LIPID PANEL
CHOL/HDL RATIO: 6
Cholesterol: 181 mg/dL (ref 0–200)
HDL: 30.1 mg/dL — ABNORMAL LOW (ref >39.00)
NonHDL: 150.83
Triglycerides: 220 mg/dL — ABNORMAL HIGH (ref 0.0–149.0)
VLDL: 44 mg/dL — ABNORMAL HIGH (ref 0.0–40.0)

## 2018-07-29 LAB — CBC WITH DIFFERENTIAL/PLATELET
BASOS ABS: 0 10*3/uL (ref 0.0–0.1)
BASOS PCT: 0.7 % (ref 0.0–3.0)
EOS ABS: 0.1 10*3/uL (ref 0.0–0.7)
Eosinophils Relative: 1.2 % (ref 0.0–5.0)
HEMATOCRIT: 47 % (ref 39.0–52.0)
HEMOGLOBIN: 16.4 g/dL (ref 13.0–17.0)
LYMPHS PCT: 25.4 % (ref 12.0–46.0)
Lymphs Abs: 1.6 10*3/uL (ref 0.7–4.0)
MCHC: 34.8 g/dL (ref 30.0–36.0)
MCV: 93 fl (ref 78.0–100.0)
Monocytes Absolute: 0.7 10*3/uL (ref 0.1–1.0)
Monocytes Relative: 11.1 % (ref 3.0–12.0)
Neutro Abs: 3.8 10*3/uL (ref 1.4–7.7)
Neutrophils Relative %: 61.6 % (ref 43.0–77.0)
Platelets: 138 10*3/uL — ABNORMAL LOW (ref 150.0–400.0)
RBC: 5.05 Mil/uL (ref 4.22–5.81)
RDW: 13.6 % (ref 11.5–15.5)
WBC: 6.2 10*3/uL (ref 4.0–10.5)

## 2018-07-29 LAB — VITAMIN D 25 HYDROXY (VIT D DEFICIENCY, FRACTURES): VITD: 27.69 ng/mL — ABNORMAL LOW (ref 30.00–100.00)

## 2018-07-29 LAB — VITAMIN B12: Vitamin B-12: 117 pg/mL — ABNORMAL LOW (ref 211–911)

## 2018-07-29 LAB — LDL CHOLESTEROL, DIRECT: LDL DIRECT: 117 mg/dL

## 2018-07-29 NOTE — Patient Instructions (Signed)
Dylan [name] , Thank you for taking time to come for your Medicare Wellness Visit. I appreciate your ongoing commitment to your health goals. Please review the following plan we discussed and let me know if I can assist you in the future.   These are the goals we discussed: Goals    . DIET - INCREASE WATER INTAKE     Starting 07/29/2018, I will attempt to drink 1 glass with each meal.        This is a list of the screening recommended for you and due dates:  Health Maintenance  Topic Date Due  . Flu Shot  07/20/2019*  . Pneumonia vaccines (1 of 2 - PCV13) 07/30/2019*  . DTaP/Tdap/Td vaccine (1 - Tdap) 10/03/2020*  . Tetanus Vaccine  10/03/2020  *Topic was postponed. The date shown is not the original due date.   Preventive Care for Adults  A healthy lifestyle and preventive care can promote health and wellness. Preventive health guidelines for adults include the following key practices.  . A routine yearly physical is a good way to check with your health care provider about your health and preventive screening. It is a chance to share any concerns and updates on your health and to receive a thorough exam.  . Visit your dentist for a routine exam and preventive care every 6 months. Brush your teeth twice a day and floss once a day. Good oral hygiene prevents tooth decay and gum disease.  . The frequency of eye exams is based on your age, health, family medical history, use  of contact lenses, and other factors. Follow your health care provider's recommendations for frequency of eye exams.  . Eat a healthy diet. Foods like vegetables, fruits, whole grains, low-fat dairy products, and lean protein foods contain the nutrients you need without too many calories. Decrease your intake of foods high in solid fats, added sugars, and salt. Eat the right amount of calories for you. Get information about a proper diet from your health care provider, if necessary.  . Regular physical exercise is  one of the most important things you can do for your health. Most adults should get at least 150 minutes of moderate-intensity exercise (any activity that increases your heart rate and causes you to sweat) each week. In addition, most adults need muscle-strengthening exercises on 2 or more days a week.  Silver Sneakers may be a benefit available to you. To determine eligibility, you may visit the website: www.silversneakers.com or contact program at 2622600161 Mon-Fri between 8AM-8PM.   . Maintain a healthy weight. The body mass index (BMI) is a screening tool to identify possible weight problems. It provides an estimate of body fat based on height and weight. Your health care provider can find your BMI and can help you achieve or maintain a healthy weight.   For adults 20 years and older: ? A BMI below 18.5 is considered underweight. ? A BMI of 18.5 to 24.9 is normal. ? A BMI of 25 to 29.9 is considered overweight. ? A BMI of 30 and above is considered obese.   . Maintain normal blood lipids and cholesterol levels by exercising and minimizing your intake of saturated fat. Eat a balanced diet with plenty of fruit and vegetables. Blood tests for lipids and cholesterol should begin at age 79 and be repeated every 5 years. If your lipid or cholesterol levels are high, you are over 50, or you are at high risk for heart disease, you may need  your cholesterol levels checked more frequently. Ongoing high lipid and cholesterol levels should be treated with medicines if diet and exercise are not working.  . If you smoke, find out from your health care provider how to quit. If you do not use tobacco, please do not start.  . If you choose to drink alcohol, please do not consume more than 2 drinks per day. One drink is considered to be 12 ounces (355 mL) of beer, 5 ounces (148 mL) of wine, or 1.5 ounces (44 mL) of liquor.  . If you are 60-71 years old, ask your health care provider if you should take  aspirin to prevent strokes.  . Use sunscreen. Apply sunscreen liberally and repeatedly throughout the day. You should seek shade when your shadow is shorter than you. Protect yourself by wearing long sleeves, pants, a wide-brimmed hat, and sunglasses year round, whenever you are outdoors.  . Once a month, do a whole body skin exam, using a mirror to look at the skin on your back. Tell your health care provider of new moles, moles that have irregular borders, moles that are larger than a pencil eraser, or moles that have changed in shape or color.

## 2018-08-02 NOTE — Progress Notes (Signed)
Subjective:   Dylan [name] is a 84 y.o. male who presents for Medicare Annual/Subsequent preventive examination.  Review of Systems:  N/A Cardiac Risk Factors include: advanced age (>61men, >14 women);male gender;dyslipidemia     Objective:    Vitals: BP 110/70 (BP Location: Right Arm, Patient Position: Sitting, Cuff Size: Normal)   Pulse 64   Temp 98.4 F (36.9 C) (Oral)   Ht 5' 11.25" (1.81 m) Comment: no shoes  Wt 178 lb 8 oz (81 kg)   SpO2 97%   BMI 24.72 kg/m   Body mass index is 24.72 kg/m.  Advanced Directives 08/02/2018 06/16/2018  Does Patient Have a Medical Advance Directive? No No  Would patient like information on creating a medical advance directive? No - Patient declined No - Patient declined    Tobacco Social History   Tobacco Use  Smoking Status Former Smoker  . Types: Cigarettes  . Last attempt to quit: 11/11/1960  . Years since quitting: 57.7  Smokeless Tobacco Never Used     Counseling given: No   Clinical Intake:  Pre-visit preparation completed: Yes  Pain : No/denies pain Pain Score: 0-No pain     Nutritional Status: BMI of 19-24  Normal Nutritional Risks: None Diabetes: No  How often do you need to have someone help you when you read instructions, pamphlets, or other written materials from your doctor or pharmacy?: 1 - Never What is the last grade level you completed in school?: 12th grade  Interpreter Needed?: No  Comments: pt lives wtih spouse Information entered by :: LPinson, LPN  Past Medical History:  Diagnosis Date  . Cellulitis    due to nail impaction thru boot L  . Hyperlipidemia 12/1999   Past Surgical History:  Procedure Laterality Date  . BASAL CELL CARCINOMA EXCISION Right 01/2015   with reconstructive surgery around eye  . CATARACT EXTRACTION Left 03/2017  . 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 KNEE ARTHROSCOPY  1993   Right  . KNEE ARTHROSCOPY  01/2001   Left   Family History   Problem Relation Age of Onset  . Heart disease Mother        CHF  . Heart disease Father        CHF  . Stroke Father 58  . Leukemia Brother    Social History   Socioeconomic History  . Marital status: Married    Spouse name: Not on file  . Number of children: 3  . Years of education: Not on file  . Highest education level: Not on file  Occupational History  . 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 Financial resource strain: Not on file  . Food insecurity:    Worry: Not on file    Inability: Not on file  . Transportation needs:    Medical: Not on file    Non-medical: Not on file  Tobacco Use  . Smoking status: Former Smoker    Types: Cigarettes    Last attempt to quit: 11/11/1960    Years since quitting: 57.7  . Smokeless tobacco: Never Used  Substance and Sexual Activity  . Alcohol use: No  . Drug use: No  . Sexual activity: Not on file  Lifestyle  . Physical activity:    Days per week: Not on file    Minutes per session: Not on file  . Stress: Not on file  Relationships  .  Social connections:    Talks on phone: Not on file    Gets together: Not on file    Attends religious service: Not on file    Active member of club or organization: Not on file    Attends meetings of clubs or organizations: Not on file    Relationship status: Not on file  Other Topics Concern  . 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   No facility-administered encounter medications on file as of 07/29/2018.     Activities of Daily Living In your present state of health, do you have any difficulty performing the following activities: 08/02/2018  Hearing? Y  Vision? N  Difficulty concentrating or making decisions? Y  Walking or  climbing stairs? N  Dressing or bathing? N  Doing errands, shopping? N  Preparing Food and eating ? N  Using the Toilet? N  In the past six months, have you accidently leaked urine? N  Do you have problems with loss of bowel control? N  Managing your Medications? N  Managing your Finances? N  Housekeeping or managing your Housekeeping? N  Some recent data might be hidden    Patient Care Team: Dylan Bush, MD as PCP - General (Family Medicine)   Assessment:   This is a routine wellness examination for [name].   Hearing Screening   125Hz  250Hz  500Hz  1000Hz  2000Hz  3000Hz  4000Hz  6000Hz  8000Hz   Right ear:   0 0 0  0    Left ear:   0 0 0  0    Vision Screening Comments: Vision exam in August 2019 with Dr. Maryruth [name] B.     Exercise Activities and Dietary recommendations Current Exercise Habits: The patient does not participate in regular exercise at present, Exercise limited by: None identified  Goals    . DIET - INCREASE WATER INTAKE     Starting 07/29/2018, I will attempt to drink 1 glass with each meal.        Fall Risk Fall Risk  08/02/2018  Falls in the past year? Yes  Comment lost balance  Number falls in past yr: 2 or more  Injury with Fall? No   Depression Screen PHQ 2/9 Scores 08/02/2018  PHQ - 2 Score 0  PHQ- 9 Score 0    Cognitive Function MMSE - Mini Mental State Exam 08/02/2018  Orientation to time 5  Orientation to Place 5  Registration 3  Attention/ Calculation 0  Recall 3  Language- name 2 objects 0  Language- repeat 1  Language- follow 3 step command 3  Language- read & follow direction 0  Write a sentence 0  Copy design 0  Total score 20       PLEASE NOTE: A Mini-Cog screen was completed. Maximum score is 20. A value of 0 denotes this part of [place] MMSE was not completed or the patient failed this part of the Mini-Cog screening.   Mini-Cog Screening Orientation to Time - Max 5 pts Orientation to Place - Max 5 pts Registration - Max 3  pts Recall - Max 3 pts Language Repeat - Max 1 pts Language Follow 3 Step Command - Max 3 pts   Immunization History  Administered Date(s) Administered  . Influenza Whole 09/11/2002  . Td 01/24/2005, 10/03/2010  Screening Tests Health Maintenance  Topic Date Due  . INFLUENZA VACCINE  07/20/2019 (Originally 06/11/2018)  . PNA vac Low Risk Adult (1 of 2 - PCV13) 07/30/2019 (Originally 02/11/2001)  . DTaP/Tdap/Td (1 - Tdap) 10/03/2020 (Originally 10/04/2010)  . TETANUS/TDAP  10/03/2020     Plan:     I have personally reviewed, addressed, and noted the following in the patient's chart:  A. Medical and social history B. Use of alcohol, tobacco or illicit drugs  C. Current medications and supplements D. Functional ability and status E.  Nutritional status F.  Physical activity G. Advance directives H. List of other physicians I.  Hospitalizations, surgeries, and ER visits in previous 12 months J.  [place] to include hearing, vision, cognitive, depression L. Referrals and appointments - none  In addition, I have reviewed and discussed with patient certain preventive protocols, quality metrics, and best practice recommendations. A written personalized care plan for preventive services as well as general preventive health recommendations were provided to patient.  See attached scanned questionnaire for additional information.   Signed,   Lindell Noe, MHA, BS, LPN Health Coach

## 2018-08-02 NOTE — Progress Notes (Signed)
PCP notes:   Health maintenance:  Flu vaccine - pt declined PNA vaccine -pt decilined  Abnormal screenings:    Hearing - failed  Hearing Screening   125Hz  250Hz  500Hz  1000Hz  2000Hz  3000Hz  4000Hz  6000Hz  8000Hz   Right ear:   0 0 0  0    Left ear:   0 0 0  0     Fall risk - hx of multiple falls Fall Risk  08/02/2018  Falls in the past year? Yes  Comment lost balance  Number falls in past yr: 2 or more  Injury with Fall? No   Patient concerns:   None  Nurse concerns:  None  Next PCP appt:   08/03/18 @ 1030

## 2018-08-03 ENCOUNTER — Encounter: Payer: Self-pay | Admitting: Family Medicine

## 2018-08-03 ENCOUNTER — Ambulatory Visit (INDEPENDENT_AMBULATORY_CARE_PROVIDER_SITE_OTHER): Payer: Medicare Other | Admitting: Family Medicine

## 2018-08-03 VITALS — BP 116/68 | HR 76 | Temp 98.2°F | Ht 71.25 in | Wt 180.2 lb

## 2018-08-03 DIAGNOSIS — G3184 Mild cognitive impairment, so stated: Secondary | ICD-10-CM

## 2018-08-03 DIAGNOSIS — H9193 Unspecified hearing loss, bilateral: Secondary | ICD-10-CM | POA: Insufficient documentation

## 2018-08-03 DIAGNOSIS — E538 Deficiency of other specified B group vitamins: Secondary | ICD-10-CM | POA: Insufficient documentation

## 2018-08-03 DIAGNOSIS — Z7189 Other specified counseling: Secondary | ICD-10-CM | POA: Insufficient documentation

## 2018-08-03 DIAGNOSIS — E559 Vitamin D deficiency, unspecified: Secondary | ICD-10-CM | POA: Diagnosis not present

## 2018-08-03 DIAGNOSIS — Z0001 Encounter for general adult medical examination with abnormal findings: Secondary | ICD-10-CM | POA: Insufficient documentation

## 2018-08-03 DIAGNOSIS — Z0000 Encounter for general adult medical examination without abnormal findings: Secondary | ICD-10-CM | POA: Insufficient documentation

## 2018-08-03 DIAGNOSIS — E785 Hyperlipidemia, unspecified: Secondary | ICD-10-CM

## 2018-08-03 DIAGNOSIS — I452 Bifascicular block: Secondary | ICD-10-CM

## 2018-08-03 DIAGNOSIS — D696 Thrombocytopenia, unspecified: Secondary | ICD-10-CM

## 2018-08-03 HISTORY — DX: Unspecified hearing loss, bilateral: H91.93

## 2018-08-03 HISTORY — DX: Deficiency of other specified B group vitamins: E53.8

## 2018-08-03 MED ORDER — CYANOCOBALAMIN 1000 MCG/ML IJ SOLN
1000.0000 ug | Freq: Once | INTRAMUSCULAR | Status: AC
  Administered 2018-08-03: 1000 ug via INTRAMUSCULAR

## 2018-08-03 MED ORDER — MEMANTINE HCL 5 MG PO TABS: 5.0000 mg | ORAL_TABLET | Freq: Every day | ORAL | 6 refills | Status: DC

## 2018-08-03 MED ORDER — B-12 1000 MCG SL SUBL: 1.0000 | SUBLINGUAL_TABLET | Freq: Every day | SUBLINGUAL | Status: DC

## 2018-08-03 MED ORDER — VITAMIN D3 25 MCG (1000 UT) PO CAPS: 1.0000 | ORAL_CAPSULE | Freq: Every day | ORAL | Status: DC

## 2018-08-03 NOTE — Patient Instructions (Addendum)
We will refer you to hearing doctor for formal evaluation.  Advanced directive packet provided today.  Vitamin B12 was very low - start b12 shot today, then weekly for the next month, then monthly for 6 months. Look for dissolvable B12 vitamin over the counter. Vitamin D was a bit low - start 1000 units daily over the counter.  Memory testing suspicious for trouble with memory - we will see effect with B12 replacement. In meantime, start namenda 5mg  daily - if tolerated may increase to twice daily.  Work on physical and mental activity and social engagement - consider local senior activity center during the day.  Return in 3-4 months for follow up visit.   Health Maintenance, Male A healthy lifestyle and preventive care is important for your health and wellness. Ask your health care provider about what schedule of regular examinations is right for you. What should I know about weight and diet? Eat a Healthy Diet  Eat plenty of vegetables, fruits, whole grains, low-fat dairy products, and lean protein.  Do not eat a lot of foods high in solid fats, added sugars, or salt.  Maintain a Healthy Weight Regular exercise can help you achieve or maintain a healthy weight. You should:  Do at least 150 minutes of exercise each week. The exercise should increase your heart rate and make you sweat (moderate-intensity exercise).  Do strength-training exercises at least twice a week.  Watch Your Levels of Cholesterol and Blood Lipids  Have your blood tested for lipids and cholesterol every 5 years starting at 82 years of age. If you are at high risk for heart disease, you should start having your blood tested when you are 83 years old. You may need to have your cholesterol levels checked more often if: ? Your lipid or cholesterol levels are high. ? You are older than 82 years of age. ? You are at high risk for heart disease.  What should I know about cancer screening? Many types of cancers can be  detected early and may often be prevented. Lung Cancer  You should be screened every year for lung cancer if: ? You are a current smoker who has smoked for at least 30 years. ? You are a former smoker who has quit within the past 15 years.  Talk to your health care provider about your screening options, when you should start screening, and how often you should be screened.  Colorectal Cancer  Routine colorectal cancer screening usually begins at 82 years of age and should be repeated every 5-10 years until you are 82 years old. You may need to be screened more often if early forms of precancerous polyps or small growths are found. Your health care provider may recommend screening at an earlier age if you have risk factors for colon cancer.  Your health care provider may recommend using home test kits to check for hidden blood in the stool.  A small camera at the end of a tube can be used to examine your colon (sigmoidoscopy or colonoscopy). This checks for the earliest forms of colorectal cancer.  Prostate and Testicular Cancer  Depending on your age and overall health, your health care provider may do certain tests to screen for prostate and testicular cancer.  Talk to your health care provider about any symptoms or concerns you have about testicular or prostate cancer.  Skin Cancer  Check your skin from head to toe regularly.  Tell your health care provider about any new moles or  changes in moles, especially if: ? There is a change in a mole's size, shape, or color. ? You have a mole that is larger than a pencil eraser.  Always use sunscreen. Apply sunscreen liberally and repeat throughout the day.  Protect yourself by wearing long sleeves, pants, a wide-brimmed hat, and sunglasses when outside.  What should I know about heart disease, diabetes, and high blood pressure?  If you are 54-76 years of age, have your blood pressure checked every 3-5 years. If you are 1 years of age  or older, have your blood pressure checked every year. You should have your blood pressure measured twice-once when you are at a hospital or clinic, and once when you are not at a hospital or clinic. Record the average of the two measurements. To check your blood pressure when you are not at a hospital or clinic, you can use: ? An automated blood pressure machine at a pharmacy. ? A home blood pressure monitor.  Talk to your health care provider about your target blood pressure.  If you are between 24-24 years old, ask your health care provider if you should take aspirin to prevent heart disease.  Have regular diabetes screenings by checking your fasting blood sugar level. ? If you are at a normal weight and have a low risk for diabetes, have this test once every three years after the age of 29. ? If you are overweight and have a high risk for diabetes, consider being tested at a younger age or more often.  A one-time screening for abdominal aortic aneurysm (AAA) by ultrasound is recommended for men aged 39-75 years who are current or former smokers. What should I know about preventing infection? Hepatitis B If you have a higher risk for hepatitis B, you should be screened for this virus. Talk with your health care provider to find out if you are at risk for hepatitis B infection. Hepatitis C Blood testing is recommended for:  Everyone born from [birth year] through 1963.  Anyone with known risk factors for hepatitis C.  Sexually Transmitted Diseases (STDs)  You should be screened each year for STDs including gonorrhea and chlamydia if: ? You are sexually active and are younger than 82 years of age. ? You are older than 83 years of age and your health care provider tells you that you are at risk for this type of infection. ? Your sexual activity has changed since you were last screened and you are at an increased risk for chlamydia or gonorrhea. Ask your health care provider if you are at  risk.  Talk with your health care provider about whether you are at high risk of being infected with HIV. Your health care provider may recommend a prescription medicine to help prevent HIV infection.  What else can I do?  Schedule regular health, dental, and eye exams.  Stay current with your vaccines (immunizations).  Do not use any tobacco products, such as cigarettes, chewing tobacco, and e-cigarettes. If you need help quitting, ask your health care provider.  Limit alcohol intake to no more than 2 drinks per day. One drink equals 12 ounces of beer, 5 ounces of wine, or 1 ounces of hard liquor.  Do not use street drugs.  Do not share needles.  Ask your health care provider for help if you need support or information about quitting drugs.  Tell your health care provider if you often feel depressed.  Tell your health care provider if you have ever  been abused or do not feel safe at home. This information is not intended to replace advice given to you by your health care provider. Make sure you discuss any questions you have with your health care provider. Document Released: 04/25/2008 Document Revised: 06/26/2016 Document Reviewed: 08/01/2015 Elsevier Interactive Patient Education  [employer].

## 2018-08-03 NOTE — Assessment & Plan Note (Signed)
Discussed healthy diet changes to affect improved lipid control. The ASCVD Risk score Mikey Bussing DC Jr., et al., 2013) failed to calculate for the following reasons:   The 2013 ASCVD risk score is only valid for ages 36 to 36

## 2018-08-03 NOTE — Assessment & Plan Note (Addendum)
Low - this could contribute to memory concerns. start with B12 shot today then weekly for 1 month then monthly for at least 6 months. Also advised start b12 dissolvable tablet daily. Reassess at f/u visit. Pt and wife agree with plan.

## 2018-08-03 NOTE — Assessment & Plan Note (Signed)
Appreciate cards care. 

## 2018-08-03 NOTE — Assessment & Plan Note (Signed)
rec start 1000 IU daily.  

## 2018-08-03 NOTE — Assessment & Plan Note (Signed)
Failed hearing screen last visit - will refer to audiology for formal assessment.

## 2018-08-03 NOTE — Assessment & Plan Note (Signed)
Chronic, stable. ?ITP vs b12 def. Continue to monitor.

## 2018-08-03 NOTE — Assessment & Plan Note (Signed)
Advanced directive discussion - does not have set up. Thinks ok with feeding tube and CPR/compressions but doesn't want prolonged life support. Wife [name] would be HCPOA. Packet provided today.

## 2018-08-03 NOTE — Assessment & Plan Note (Addendum)
MMSE today 24/30. Discussed with patient and wife. Avoid aricept as there's already concern for cardiac conduction abnormality - start namenda 5mg  daily with option to increase to BID dosing.  Encouraged physical and mental stimulation as well as social engagement - suggested they look at local senior center activities available.

## 2018-08-03 NOTE — Assessment & Plan Note (Signed)
Preventative protocols reviewed and updated unless pt declined. Discussed healthy diet and lifestyle.  

## 2018-08-03 NOTE — Progress Notes (Signed)
BP 116/68 (BP Location: Left Arm, Patient Position: Sitting, Cuff Size: Normal)   Pulse 76   Temp 98.2 F (36.8 C) (Oral)   Ht 5' 11.25" (1.81 m)   Wt 180 lb 4 oz (81.8 kg)   SpO2 96%   BMI 24.96 kg/m    CC: CPE Subjective:    Patient ID: Dylan [name], male    DOB: 10-24-1934, 84 y.o.   MRN: [id number]  HPI: Dylan [name] is a 84 y.o. male presenting on 08/03/2018 for Annual Exam (Pt 2.  Pt accompanied by his wife.)   Dylan [name] last week for medicare wellness visit. Note reviewed. Failed hearing screen. Agrees to audiology eval.   Several falls in the past year, one led to hospitalization. He was seen by cardiology after this, deemed due to syncope and pacemaker was recommended but patient declined. Recent Zio patch - considering implanted loop recorder if needed. Denies unsteadiness. Discussed fall prevention PT program. Known h/o RBBB and LAFB on EKG 2008.   Ongoing concerns for memory deficit - here today for MMSE as well.   No stool or urine incontinence.  Geriatric Assessment: Activities of Daily Living:     Bathing- independent    Dressing- independent    Eating- independent     Toileting- independent    Transferring- independent    Continence- independent Overall Assessment: independent  Instrumental Activities of Daily Living:     Transportation- dependent (not driving while heart is being evaluated)    Meal/Food Preparation- dependent    Shopping Errands- dependent    Housekeeping/Chores- dependent    Money Management/Finances- dependent    Medication Management- not on medicines    Ability to Use Telephone- independent    Laundry- dependent Overall Assessment: Dependent  Mental Status Exam: 24/30(value/max value)     Clock Drawing Score: 2/4   Preventative: No recent physical. Colon cancer screening - aged out. Denies changes in BM or blood. Prostate cancer screening - aged out.  Lung cancer screening - not eligible Flu shot - declines Td  2011 Pneumovax - discussed, declines Shingrix - declines Advanced directive discussion - does not have set up. Thinks ok with feeding tube and CPR/compressions but doesn't want prolonged life support. Wife Dylan [name] would be HCPOA. Packet provided today.  Seat [name] use discussed Sunscreen use discussed - doesn't wear. No changing moles on skin. 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 Interim medical history since our last visit reviewed. Allergies and medications reviewed and updated. No outpatient medications prior to visit.   No facility-administered medications prior to visit.      Per HPI unless specifically indicated in ROS section below Review of Systems  Constitutional: Negative for activity change, appetite change, chills, fatigue, fever and unexpected weight change.  HENT: Negative for hearing loss.   Eyes: Negative for visual disturbance.  Respiratory: Negative for cough, chest tightness, shortness of breath and wheezing.   Cardiovascular: Positive for leg swelling. Negative for chest pain and palpitations.  Gastrointestinal: Negative for abdominal distention, abdominal pain, blood in stool, constipation, diarrhea, nausea and vomiting.  Genitourinary: Negative for difficulty urinating and hematuria.  Musculoskeletal: Negative for arthralgias, myalgias and neck pain.  Skin: Negative for  rash.  Neurological: Positive for dizziness. Negative for seizures, syncope and headaches.  Hematological: Negative for adenopathy. Does not bruise/bleed easily.  Psychiatric/Behavioral: Negative for dysphoric mood. The patient is not nervous/anxious.        Objective:    BP  116/68 (BP Location: Left Arm, Patient Position: Sitting, Cuff Size: Normal)   Pulse 76   Temp 98.2 F (36.8 C) (Oral)   Ht 5' 11.25" (1.81 m)   Wt 180 lb 4 oz (81.8 kg)   SpO2 96%   BMI 24.96 kg/m   Wt Readings from Last 3 Encounters:  08/03/18 180 lb 4 oz (81.8 kg)  07/29/18 178 lb 8 oz (81 kg)  06/25/18 177 lb (80.3 kg)    Physical Exam  Constitutional: He is oriented to person, place, and time. He appears well-developed and well-nourished. No distress.  HENT:  Head: Normocephalic and atraumatic.  Right Ear: Hearing, tympanic membrane, external ear and ear canal normal.  Left Ear: Hearing, tympanic membrane, external ear and ear canal normal.  Nose: Nose normal.  Mouth/Throat: Uvula is midline, oropharynx is clear and moist and mucous membranes are normal. No oropharyngeal exudate, posterior oropharyngeal edema or posterior oropharyngeal erythema.  Eyes: Pupils are equal, round, and reactive to light. Conjunctivae and EOM are normal. No scleral icterus.  Neck: Normal range of motion. Neck supple. Carotid bruit is not present. No thyromegaly present.  Cardiovascular: Normal rate, regular rhythm, normal heart sounds and intact distal pulses.  No murmur heard. Pulses:      Radial pulses are 2+ on the right side, and 2+ on the left side.  Pulmonary/Chest: Effort normal and breath sounds normal. No respiratory distress. He has no wheezes. He has no rales.  Abdominal: Soft. Bowel sounds are normal. He exhibits no distension and no mass. There is no tenderness. There is no rebound and no guarding.  Musculoskeletal: Normal range of motion. He exhibits no edema.  Lymphadenopathy:    He has no cervical adenopathy.  Neurological: He is alert and oriented to person, place, and time.  CN grossly intact, station and gait intact  Skin: Skin is warm and dry. No rash noted.  Psychiatric: He has a normal mood and affect. His behavior is normal. Judgment and thought content normal.  Nursing note  and vitals reviewed.  Results for orders placed or performed in visit on 07/29/18  Vitamin B12  Result Value Ref Range   Vitamin B-12 117 (L) 211 - 911 pg/mL  VITAMIN D 25 Hydroxy (Vit-D Deficiency, Fractures)  Result Value Ref Range   VITD 27.69 (L) 30.00 - 100.00 ng/mL  CBC with Differential/Platelet  Result Value Ref Range   WBC 6.2 4.0 - 10.5 K/uL   RBC 5.05 4.22 - 5.81 Mil/uL   Hemoglobin 16.4 13.0 - 17.0 g/dL   HCT 47.0 39.0 - 52.0 %   MCV 93.0 78.0 - 100.0 fl   MCHC 34.8 30.0 - 36.0 g/dL   RDW 13.6 11.5 - 15.5 %   Platelets 138.0 (L) 150.0 - 400.0 K/uL   Neutrophils Relative % 61.6 43.0 - 77.0 %   Lymphocytes Relative 25.4 12.0 - 46.0 %   Monocytes Relative 11.1 3.0 - 12.0 %   Eosinophils Relative 1.2 0.0 - 5.0 %   Basophils Relative 0.7 0.0 - 3.0 %   Neutro Abs 3.8 1.4 - 7.7 K/uL   Lymphs Abs 1.6 0.7 - 4.0 K/uL   Monocytes Absolute 0.7 0.1 - 1.0 K/uL   Eosinophils Absolute 0.1 0.0 -  0.7 K/uL   Basophils Absolute 0.0 0.0 - 0.1 K/uL  Comprehensive metabolic panel  Result Value Ref Range   Sodium 139 135 - 145 mEq/L   Potassium 4.5 3.5 - 5.1 mEq/L   Chloride 102 96 - 112 mEq/L   CO2 32 19 - 32 mEq/L   Glucose, Bld 97 70 - 99 mg/dL   BUN 12 6 - 23 mg/dL   Creatinine, Ser 1.22 0.40 - 1.50 mg/dL   Total Bilirubin 0.6 0.2 - 1.2 mg/dL   Alkaline Phosphatase 81 39 - 117 U/L   AST 18 0 - 37 U/L   ALT 16 0 - 53 U/L   Total Protein 6.9 6.0 - 8.3 g/dL   Albumin 4.2 3.5 - 5.2 g/dL   Calcium 9.5 8.4 - 10.5 mg/dL   GFR 60.37 >60.00 mL/min  Lipid panel  Result Value Ref Range   Cholesterol 181 0 - 200 mg/dL   Triglycerides 220.0 (H) 0.0 - 149.0 mg/dL   HDL 30.10 (L) >39.00 mg/dL   VLDL 44.0 (H) 0.0 - 40.0 mg/dL   Total CHOL/HDL Ratio 6    NonHDL 150.83   LDL cholesterol, direct  Result Value Ref Range   Direct LDL 117.0 mg/dL      Assessment & Plan:   Problem List Items Addressed This Visit    Vitamin D deficiency    rec start 1000 IU daily.       Vitamin B12  deficiency    Low - this could contribute to memory concerns. start with B12 shot today then weekly for 1 month then monthly for at least 6 months. Also advised start b12 dissolvable tablet daily. Reassess at f/u visit. Pt and wife agree with plan.       Relevant Medications   cyanocobalamin ((VITAMIN B-12)) injection 1,000 mcg (Completed)   Thrombocytopenia (HCC)    Chronic, stable. ?ITP vs b12 def. Continue to monitor.       Right bundle branch block with left anterior fascicular block    Appreciate cards care.       Mild cognitive impairment with memory loss    MMSE today 24/30. Discussed with patient and wife. Avoid aricept as there's already concern for cardiac conduction abnormality - start namenda 5mg  daily with option to increase to BID dosing.  Encouraged physical and mental stimulation as well as social engagement - suggested they look at local senior center activities available.       Health maintenance examination - Primary    Preventative protocols reviewed and updated unless pt declined. Discussed healthy diet and lifestyle.       Dyslipidemia    Discussed healthy diet changes to affect improved lipid control. The ASCVD Risk score Mikey Bussing DC Jr., et al., 2013) failed to calculate for the following reasons:   The 2013 ASCVD risk score is only valid for ages 11 to 31       Bilateral hearing loss    Failed hearing screen last visit - will refer to audiology for formal assessment.       Relevant Orders   Ambulatory referral to Audiology   Advanced care planning/counseling discussion    Advanced directive discussion - does not have set up. Thinks ok with feeding tube and CPR/compressions but doesn't want prolonged life support. Wife Dylan [name] would be HCPOA. Packet provided today.           Meds ordered this encounter  Medications  . memantine (NAMENDA) 5 MG tablet    Sig: Take  1 tablet (5 mg total) by mouth daily.    Dispense:  30 tablet    Refill:  6  .  Cyanocobalamin (B-12) 1000 MCG SUBL    Sig: Place 1 tablet under the tongue daily.    Dispense:  30 each  . Cholecalciferol (VITAMIN D3) 1000 units CAPS    Sig: Take 1 capsule (1,000 Units total) by mouth daily.    Dispense:  30 capsule  . cyanocobalamin ((VITAMIN B-12)) injection 1,000 mcg   Orders Placed This Encounter  Procedures  . Ambulatory referral to Audiology    Referral Priority:   Routine    Referral Type:   Audiology Exam    Referral Reason:   Specialty Services Required    Number of Visits Requested:   1    Follow up plan: Return in about 3 months (around 11/02/2018) for follow up visit.  [name], MD

## 2018-08-05 ENCOUNTER — Telehealth: Payer: Self-pay | Admitting: Family Medicine

## 2018-08-05 NOTE — Telephone Encounter (Signed)
He needs one more weekly inj on 09/02/18. Looks like he will need 4 more monthly inj after the 10/28/18 OV (which he'll get one at that visit).

## 2018-08-05 NOTE — Telephone Encounter (Signed)
Dylan [name] Can you help with mr Mirante's b12 injections? Pt has several appointment scheduled and I wanted to make sure what is schedule is correct.   Thanks

## 2018-08-05 NOTE — Telephone Encounter (Signed)
Made 10/23 appointment Canceled appointment 11/20

## 2018-08-11 NOTE — Progress Notes (Signed)
I reviewed health advisor's note, was available for consultation, and agree with documentation and plan.  

## 2018-08-12 ENCOUNTER — Ambulatory Visit (INDEPENDENT_AMBULATORY_CARE_PROVIDER_SITE_OTHER): Payer: Medicare Other

## 2018-08-12 DIAGNOSIS — E538 Deficiency of other specified B group vitamins: Secondary | ICD-10-CM

## 2018-08-12 MED ORDER — CYANOCOBALAMIN 1000 MCG/ML IJ SOLN
1000.0000 ug | Freq: Once | INTRAMUSCULAR | Status: AC
  Administered 2018-08-12: 1000 ug via INTRAMUSCULAR

## 2018-08-12 NOTE — Progress Notes (Signed)
Cardiology Office Note:    Date:  08/13/2018   ID:  Dylan [name], DOB 1934-04-16, MRN [id number]  PCP:  Dylan Bush, MD  Cardiologist:  Dylan More, MD    Referring MD: Dylan Bush, MD    ASSESSMENT:    1. Syncope, unspecified syncope type   2. Right bundle branch block with left anterior fascicular block    PLAN:    In order of problems listed above:  1. In the setting of bifascicular heart block I think he should be considered for pacemaker and referred to electrophysiology.  He has atrial arrhythmia after pacemaker to be continuously monitored and if he had significant burden we can start suppressive therapy of beta-blocker calcium channel blocker but I would not do it at this time in the absence of a back-up pacemaker.  Patient is daughter were hesitant but now agrees and will see EP for consultation.   Next appointment: 3 months   Medication Adjustments/Labs and Tests Ordered: Current medicines are reviewed at length with the patient today.  Concerns regarding medicines are outlined above.  Orders Placed This Encounter  Procedures  . Ambulatory referral to Cardiac Electrophysiology   No orders of the defined types were placed in this encounter.   Chief Complaint  Patient presents with  . Loss of Consciousness    with bifasicular heart block    History of Present Illness:    Dylan [name] is a 84 y.o. male with a hx of bifascicular heart block and syncope last seen 06/25/18. Compliance with diet, lifestyle and medications: Yes  He has had some episodes of lightheadedness but no recurrent syncopal event.  He is having no palpitation chest pain shortness of breath. Subsequent 14-day extended heart rhythm monitor was performed showing runs of atrial premature contractions the longest episode 11 seconds 122 bpm.  There were notes symptomatic or bradycardic events  Past Medical History:  Diagnosis Date  . Cellulitis    due to nail impaction thru boot  L  . Hyperlipidemia 12/1999    Past Surgical History:  Procedure Laterality Date  . BASAL CELL CARCINOMA EXCISION Right 01/2015   with reconstructive surgery around eye  . CATARACT EXTRACTION Left 03/2017  . 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 KNEE ARTHROSCOPY  1993   Right  . KNEE ARTHROSCOPY  01/2001   Left    Current Medications: Current Meds  Medication Sig  . Cholecalciferol (VITAMIN D3) 1000 units CAPS Take 1 capsule (1,000 Units total) by mouth daily.  . Cyanocobalamin (B-12) 1000 MCG SUBL Place 1 tablet under the tongue daily.  . Cyanocobalamin 1000 MCG/ML KIT Inject 1,000 mcg as directed as directed.  . memantine (NAMENDA) 5 MG tablet Take 1 tablet (5 mg total) by mouth daily.     Allergies:   Lincomycin hcl   Social History   Socioeconomic History  . Marital status: Married    Spouse name: Not on file  . Number of children: 3  . Years of education: Not on file  . Highest education level: Not on file  Occupational History  . 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 Financial resource strain: Not on file  . Food insecurity:    Worry: Not on file    Inability: Not on file  . Transportation needs:    Medical: Not on file    Non-medical: Not on file  Tobacco Use  .  Smoking status: Former Smoker    Types: Cigarettes    Last attempt to quit: 11/11/1960    Years since quitting: 57.7  . Smokeless tobacco: Never Used  Substance and Sexual Activity  . Alcohol use: No  . Drug use: No  . Sexual activity: Not on file  Lifestyle  . Physical activity:    Days per week: Not on file    Minutes per session: Not on file  . Stress: Not on file  Relationships  . Social connections:    Talks on phone: Not on file    Gets together: Not on file    Attends religious service: Not on file    Active member of club or organization: Not on file    Attends meetings of clubs or  organizations: Not on file    Relationship status: Not on file  Other Topics Concern  . 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 ROS:   Please see the history of present illness.    All other systems reviewed and are negative.  EKGs/Labs/Other Studies Reviewed:    The following studies were reviewed today:    Recent Labs: 06/22/2018: TSH 1.36 07/29/2018: ALT 16; BUN 12; Creatinine, Ser 1.22; Hemoglobin 16.4; Platelets 138.0; Potassium 4.5; Sodium 139  Recent Lipid Panel    Component Value Date/Time   CHOL 181 07/29/2018 1207   TRIG 220.0 (H) 07/29/2018 1207   HDL 30.10 (L) 07/29/2018 1207   CHOLHDL 6 07/29/2018 1207   VLDL 44.0 (H) 07/29/2018 1207   LDLCALC 114 (H) 02/18/2011 0815   LDLDIRECT 117.0 07/29/2018 1207    Physical Exam:    VS:  BP 102/62 (BP Location: Right Arm, Patient Position: Sitting, Cuff Size: Normal)   Pulse (!) 55   Ht 5' 11.25" (1.81 m)   Wt 183 lb 6.4 oz (83.2 kg)   SpO2 96%   BMI 25.40 kg/m     Wt Readings from Last 3 Encounters:  08/13/18 183 lb 6.4 oz (83.2 kg)  08/03/18 180 lb 4 oz (81.8 kg)  07/29/18 178 lb 8 oz (81 kg)     GEN:  Well nourished, well developed in no acute distress HEENT: Normal NECK: No JVD; No carotid bruits LYMPHATICS: No lymphadenopathy CARDIAC: RRR, no murmurs, rubs, gallops RESPIRATORY:  Clear to auscultation without rales, wheezing or rhonchi  ABDOMEN: Soft, non-tender, non-distended MUSCULOSKELETAL:  No edema; No deformity  SKIN: Warm and dry NEUROLOGIC:  Alert and oriented x 3 PSYCHIATRIC:  Normal affect    Signed, Dylan More, MD  08/13/2018 11:51 AM    [place]

## 2018-08-12 NOTE — Progress Notes (Signed)
Pt received #2 of 4 weekly B12 injections in his Left Deltoid. Tolerated well. Sent to Dr Glori Bickers in Dr Gutierrez's absence this afternoon.

## 2018-08-13 ENCOUNTER — Ambulatory Visit: Payer: Medicare Other | Admitting: Cardiology

## 2018-08-13 VITALS — BP 102/62 | HR 55 | Ht 71.25 in | Wt 183.4 lb

## 2018-08-13 DIAGNOSIS — R55 Syncope and collapse: Secondary | ICD-10-CM

## 2018-08-13 DIAGNOSIS — I452 Bifascicular block: Secondary | ICD-10-CM | POA: Diagnosis not present

## 2018-08-13 NOTE — Patient Instructions (Signed)
Medication Instructions:  Your physician recommends that you continue on your current medications as directed. Please refer to the Current Medication list given to you today.   Labwork: NONE  Testing/Procedures: NONE  Follow-Up: Your physician wants you to follow-up in: 3 months. You will receive a reminder letter in the mail two months in advance. If you don't receive a letter, please call our office to schedule the follow-up appointment.   Any Other Special Instructions Will Be Listed Below (If Applicable).     If you need a refill on your cardiac medications before your next appointment, please call your pharmacy.   

## 2018-08-19 ENCOUNTER — Ambulatory Visit (INDEPENDENT_AMBULATORY_CARE_PROVIDER_SITE_OTHER): Payer: Medicare Other | Admitting: *Deleted

## 2018-08-19 DIAGNOSIS — E538 Deficiency of other specified B group vitamins: Secondary | ICD-10-CM | POA: Diagnosis not present

## 2018-08-19 MED ORDER — CYANOCOBALAMIN 1000 MCG/ML IJ SOLN
1000.0000 ug | Freq: Once | INTRAMUSCULAR | Status: AC
  Administered 2018-08-19: 1000 ug via INTRAMUSCULAR

## 2018-08-19 NOTE — Progress Notes (Signed)
Per orders of Dr. Gutierrez, injection of b12 given by Luian Schumpert H. Patient tolerated injection well.  

## 2018-08-20 ENCOUNTER — Encounter: Payer: Self-pay | Admitting: *Deleted

## 2018-08-26 ENCOUNTER — Ambulatory Visit (INDEPENDENT_AMBULATORY_CARE_PROVIDER_SITE_OTHER): Payer: Medicare Other

## 2018-08-26 DIAGNOSIS — E538 Deficiency of other specified B group vitamins: Secondary | ICD-10-CM

## 2018-08-26 DIAGNOSIS — L821 Other seborrheic keratosis: Secondary | ICD-10-CM | POA: Diagnosis not present

## 2018-08-26 DIAGNOSIS — L570 Actinic keratosis: Secondary | ICD-10-CM | POA: Diagnosis not present

## 2018-08-26 DIAGNOSIS — L812 Freckles: Secondary | ICD-10-CM | POA: Diagnosis not present

## 2018-08-26 DIAGNOSIS — L578 Other skin changes due to chronic exposure to nonionizing radiation: Secondary | ICD-10-CM | POA: Diagnosis not present

## 2018-08-26 DIAGNOSIS — L820 Inflamed seborrheic keratosis: Secondary | ICD-10-CM | POA: Diagnosis not present

## 2018-08-26 MED ORDER — CYANOCOBALAMIN 1000 MCG/ML IJ SOLN
1000.0000 ug | Freq: Once | INTRAMUSCULAR | Status: AC
  Administered 2018-08-26: 1000 ug via INTRAMUSCULAR

## 2018-08-26 NOTE — Progress Notes (Signed)
Pt given #4 of 4 45ml B12 injection in Left Deltoid. Tolerated well. Pt made aware to make next appt for 1 month. He is taking his daily oral supplement, also.

## 2018-09-02 ENCOUNTER — Ambulatory Visit: Payer: Medicare Other

## 2018-09-03 ENCOUNTER — Ambulatory Visit: Payer: Medicare Other | Admitting: Cardiology

## 2018-09-03 ENCOUNTER — Encounter: Payer: Self-pay | Admitting: Cardiology

## 2018-09-03 VITALS — BP 126/60 | HR 63 | Ht 71.75 in | Wt 183.0 lb

## 2018-09-03 DIAGNOSIS — R55 Syncope and collapse: Secondary | ICD-10-CM | POA: Diagnosis not present

## 2018-09-03 NOTE — Progress Notes (Signed)
Electrophysiology Office Note   Date:  09/03/2018   ID:  Dylan [name], DOB 1933-12-04, MRN [id number]  PCP:  [name], MD  Cardiologist:  Bettina Gavia Primary Electrophysiologist:  Amiir Heckard [name], MD    No chief complaint on file.    History of Present Illness: Dylan [name] is a 84 y.o. male who is being seen today for the evaluation of syncope at the request of Shirlee More. Presenting today for electrophysiology evaluation.  He has a history of hyperlipidemia.  He had an episode of headedness but no syncope.  No palpitations or chest pain.  He wore a 14-day monitor that showed PACs but no symptomatic or bradycardic events.  He has had 3 events in the last 2 years.  Each event has occurred fairly rapidly.  He has fallen forward one time, and the other 2 he has been able to sit down in a chair.  He does have dizziness, weakness, and fatigue prior to his episodes.  He wore a 14-day monitor and had similar presyncopal episodes without any major abnormality found on his monitor.  Today, he denies symptoms of palpitations, chest pain, shortness of breath, orthopnea, PND, lower extremity edema, claudication, dizziness, presyncope, syncope, bleeding, or neurologic sequela. The patient is tolerating medications without difficulties.    Past Medical History:  Diagnosis Date  . Cellulitis    due to nail impaction thru boot L  . Hyperlipidemia 12/1999   Past Surgical History:  Procedure Laterality Date  . BASAL CELL CARCINOMA EXCISION Right 01/2015   with reconstructive surgery around eye  . CATARACT EXTRACTION Left 03/2017  . 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 KNEE ARTHROSCOPY  1993   Right  . KNEE ARTHROSCOPY  01/2001   Left     Current Outpatient Medications  Medication Sig Dispense Refill  . Cholecalciferol (VITAMIN D3) 1000 units CAPS Take 1 capsule (1,000 Units total) by mouth daily. 30 capsule   . Cyanocobalamin (B-12) 1000 MCG SUBL  Place 1 tablet under the tongue daily. 30 each   . Cyanocobalamin 1000 MCG/ML KIT Inject 1,000 mcg as directed as directed.    . memantine (NAMENDA) 5 MG tablet Take 1 tablet (5 mg total) by mouth daily. 30 tablet 6   No current facility-administered medications for this visit.     Allergies:   Lincomycin hcl   Social History:  The patient  reports that he quit smoking about 57 years ago. His smoking use included cigarettes. He has never used smokeless tobacco. He reports that he does not drink alcohol or use drugs.   Family History:  The patient's family history includes Congestive Heart Failure in his father and mother; Leukemia in his brother; Stroke (age of onset: 72) in his father.    ROS:  Please see the history of present illness.   Otherwise, review of systems is positive for fatigue, snoring, diarrhea, dizziness, passing out.   All other systems are reviewed and negative.    PHYSICAL EXAM: VS:  BP 126/60   Pulse 63   Ht 5' 11.75" (1.822 m)   Wt 183 lb (83 kg)   BMI 24.99 kg/m  , BMI Body mass index is 24.99 kg/m. GEN: Well nourished, well developed, in no acute distress  HEENT: normal  Neck: no JVD, carotid bruits, or masses Cardiac: RRR; no murmurs, rubs, or gallops,no edema  Respiratory:  clear to auscultation bilaterally, normal work of breathing GI: soft, nontender, nondistended, + BS  MS: no deformity or atrophy  Skin: warm and dry Neuro:  Strength and sensation are intact Psych: euthymic mood, full affect  EKG:  EKG is ordered today. Personal review of the ekg ordered shows sinus rhythm, right bundle branch block, left anterior fascicular block  Recent Labs: 06/22/2018: TSH 1.36 07/29/2018: ALT 16; BUN 12; Creatinine, Ser 1.22; Hemoglobin 16.4; Platelets 138.0; Potassium 4.5; Sodium 139    Lipid Panel     Component Value Date/Time   CHOL 181 07/29/2018 1207   TRIG 220.0 (H) 07/29/2018 1207   HDL 30.10 (L) 07/29/2018 1207   CHOLHDL 6 07/29/2018 1207    VLDL 44.0 (H) 07/29/2018 1207   LDLCALC 114 (H) 02/18/2011 0815   LDLDIRECT 117.0 07/29/2018 1207     Wt Readings from Last 3 Encounters:  09/03/18 183 lb (83 kg)  08/13/18 183 lb 6.4 oz (83.2 kg)  08/03/18 180 lb 4 oz (81.8 kg)      Other studies Reviewed: Additional studies/ records that were reviewed today include: ZIO monitor 07/20/18 - personally reviewed Review of the above records today demonstrates:  297 runs of APCs longest lasting 11.2 seconds with a rate of 122 bpm the fastest 5 complexes a rate of 169 bpm.  There are no episodes of atrial fibrillation or flutter. There were no episodes of sinus node or AV nodal block There are no symptomatic events    ASSESSMENT AND PLAN:  1.  Syncope: Point it is unclear as to the cause of his syncope.  It does certainly sound like it could be arrhythmogenic, but his cardiac monitor did not show any rhythm abnormalities and he had similar presyncopal symptoms while wearing his monitor.  At this point, I think that a treadmill test would work well to see if he has any chronotropic incompetence or worsening rhythm abnormalities with faster heart rates.  I talked to him about the possibility of pacemaker implant versus Linq implant afterwards depending on a treadmill results.    Current medicines are reviewed at length with the patient today.   The patient does not have concerns regarding his medicines.  The following changes were made today:  none  Labs/ tests ordered today include:  Orders Placed This Encounter  Procedures  . Exercise Tolerance Test  . EKG 12-Lead   Case discussed with primary cardiology  Disposition:   FU with Brayley Mackowiak 3 months  Signed, Kalinda Romaniello [name], MD  09/03/2018 10:38 AM     [facility] [address] [place] [address] [phone] (office) [phone] (fax)

## 2018-09-03 NOTE — Patient Instructions (Signed)
Medication Instructions:  Your physician recommends that you continue on your current medications as directed. Please refer to the Current Medication list given to you today.  If you need a refill on your cardiac medications before your next appointment, please call your pharmacy.   Lab work: None ordered  Testing/Procedures: Your physician has requested that you have an exercise tolerance test. For further information please visit [email or web address]. Please also follow instruction sheet, as given.  Follow-Up: To be determined after your stress testing  Thank you for choosing [facility]!!   Trinidad Curet, RN [phone]  Any Other Special Instructions Will Be Listed Below (If Applicable).  Exercise Stress Electrocardiogram An exercise stress electrocardiogram is a test that is done to evaluate the blood supply to your heart. This test may also be called exercise stress electrocardiography. The test is done while you are walking on a treadmill. The goal of this test is to raise your heart rate. This test is done to find areas of poor blood flow to the heart by determining the extent of coronary artery disease (CAD). CAD is defined as narrowing in one or more heart (coronary) arteries of more than 70%. If you have an abnormal test result, this may mean that you are not getting adequate blood flow to your heart during exercise. Additional testing may be needed to understand why your test was abnormal. Tell a health care provider about:  Any allergies you have.  All medicines you are taking, including vitamins, herbs, eye drops, creams, and over-the-counter medicines.  Any problems you or family members have had with anesthetic medicines.  Any blood disorders you have.  Any surgeries you have had.  Any medical conditions you have.  Possibility of pregnancy, if this applies. What are the risks? Generally, this is a safe procedure. However, as with any procedure,  complications can occur. Possible complications can include:  Pain or pressure in the following areas: ? Chest. ? Jaw or neck. ? Between your shoulder blades. ? Radiating down your left arm.  Dizziness or light-headedness.  Shortness of breath.  Increased or irregular heartbeats.  Nausea or vomiting.  Heart attack (rare).  What happens before the procedure?  Avoid all forms of caffeine 24 hours before your test or as directed by your health care provider. This includes coffee, tea (even decaffeinated tea), caffeinated sodas, chocolate, cocoa, and certain pain medicines.  Follow your health care provider's instructions regarding eating and drinking before the test.  Take your medicines as directed at regular times with water unless instructed otherwise. Exceptions may include: ? If you have diabetes, ask how you are to take your insulin or pills. It is common to adjust insulin dosing the morning of the test. ? If you are taking beta-blocker medicines, it is important to talk to your health care provider about these medicines well before the date of your test. Taking beta-blocker medicines may interfere with the test. In some cases, these medicines need to be changed or stopped 24 hours or more before the test. ? If you wear a nitroglycerin patch, it may need to be removed prior to the test. Ask your health care provider if the patch should be removed before the test.  If you use an inhaler for any breathing condition, bring it with you to the test.  If you are an outpatient, bring a snack so you can eat right after the stress phase of the test.  Do not smoke for 4 hours prior to the test  or as directed by your health care provider.  Do not apply lotions, powders, creams, or oils on your chest prior to the test.  Wear loose-fitting clothes and comfortable shoes for the test. This test involves walking on a treadmill. What happens during the procedure?  Multiple patches  (electrodes) will be put on your chest. If needed, small areas of your chest may have to be shaved to get better contact with the electrodes. Once the electrodes are attached to your body, multiple wires will be attached to the electrodes and your heart rate will be monitored.  Your heart will be monitored both at rest and while exercising.  You will walk on a treadmill. The treadmill will be started at a slow pace. The treadmill speed and incline will gradually be increased to raise your heart rate. What happens after the procedure?  Your heart rate and blood pressure will be monitored after the test.  You may return to your normal schedule including diet, activities, and medicines, unless your health care provider tells you otherwise. This information is not intended to replace advice given to you by your health care provider. Make sure you discuss any questions you have with your health care provider. Document Released: 10/25/2000 Document Revised: 04/04/2016 Document Reviewed: 07/05/2013 Elsevier Interactive Patient Education  2017 [employer].

## 2018-09-04 ENCOUNTER — Ambulatory Visit (INDEPENDENT_AMBULATORY_CARE_PROVIDER_SITE_OTHER): Payer: Medicare Other

## 2018-09-04 DIAGNOSIS — R55 Syncope and collapse: Secondary | ICD-10-CM | POA: Diagnosis not present

## 2018-09-04 LAB — EXERCISE TOLERANCE TEST
CHL CUP MPHR: 138 {beats}/min
CHL CUP RESTING HR STRESS: 58 {beats}/min
CHL RATE OF PERCEIVED EXERTION: 15
Estimated workload: 4.6 METS
Exercise duration (min): 2 min
Exercise duration (sec): 10 s
Peak HR: 125 {beats}/min
Percent HR: 90 %

## 2018-09-11 HISTORY — PX: OTHER SURGICAL HISTORY: SHX169

## 2018-09-19 ENCOUNTER — Emergency Department: Payer: Medicare Other

## 2018-09-19 ENCOUNTER — Emergency Department
Admission: EM | Admit: 2018-09-19 | Discharge: 2018-09-20 | Disposition: A | Discharge status: Home / Self Care | Payer: Medicare Other | Attending: Emergency Medicine | Admitting: Emergency Medicine

## 2018-09-19 ENCOUNTER — Other Ambulatory Visit: Payer: Self-pay

## 2018-09-19 DIAGNOSIS — Z79899 Other long term (current) drug therapy: Secondary | ICD-10-CM | POA: Insufficient documentation

## 2018-09-19 DIAGNOSIS — R55 Syncope and collapse: Secondary | ICD-10-CM | POA: Diagnosis not present

## 2018-09-19 DIAGNOSIS — Z87891 Personal history of nicotine dependence: Secondary | ICD-10-CM | POA: Diagnosis not present

## 2018-09-19 DIAGNOSIS — R05 Cough: Secondary | ICD-10-CM | POA: Diagnosis not present

## 2018-09-19 LAB — COMPREHENSIVE METABOLIC PANEL
ALT: 17 U/L (ref 0–44)
ANION GAP: 10 (ref 5–15)
AST: 24 U/L (ref 15–41)
Albumin: 3.9 g/dL (ref 3.5–5.0)
Alkaline Phosphatase: 69 U/L (ref 38–126)
BILIRUBIN TOTAL: 0.9 mg/dL (ref 0.3–1.2)
BUN: 13 mg/dL (ref 8–23)
CO2: 26 mmol/L (ref 22–32)
Calcium: 9 mg/dL (ref 8.9–10.3)
Chloride: 103 mmol/L (ref 98–111)
Creatinine, Ser: 1.38 mg/dL — ABNORMAL HIGH (ref 0.61–1.24)
GFR calc Af Amer: 53 mL/min — ABNORMAL LOW (ref >60)
GFR, EST NON AFRICAN AMERICAN: 46 mL/min — AB (ref >60)
Glucose, Bld: 139 mg/dL — ABNORMAL HIGH (ref 70–99)
POTASSIUM: 3.7 mmol/L (ref 3.5–5.1)
Sodium: 139 mmol/L (ref 135–145)
TOTAL PROTEIN: 6.5 g/dL (ref 6.5–8.1)

## 2018-09-19 LAB — CBC
HEMATOCRIT: 48.5 % (ref 39.0–52.0)
Hemoglobin: 16.6 g/dL (ref 13.0–17.0)
MCH: 31.9 pg (ref 26.0–34.0)
MCHC: 34.2 g/dL (ref 30.0–36.0)
MCV: 93.3 fL (ref 80.0–100.0)
Platelets: 118 10*3/uL — ABNORMAL LOW (ref 150–400)
RBC: 5.2 MIL/uL (ref 4.22–5.81)
RDW: 12.4 % (ref 11.5–15.5)
WBC: 6.6 10*3/uL (ref 4.0–10.5)
nRBC: 0 % (ref 0.0–0.2)

## 2018-09-19 LAB — TROPONIN I

## 2018-09-19 MED ORDER — SODIUM CHLORIDE 0.9 % IV SOLN
1000.0000 mL | Freq: Once | INTRAVENOUS | Status: AC
  Administered 2018-09-19: 1000 mL via INTRAVENOUS

## 2018-09-19 MED ORDER — ONDANSETRON HCL 4 MG/2ML IJ SOLN
4.0000 mg | Freq: Once | INTRAMUSCULAR | Status: AC
  Administered 2018-09-19: 4 mg via INTRAVENOUS
  Filled 2018-09-19: qty 2

## 2018-09-19 NOTE — ED Notes (Signed)
Patient transported to X-ray at this time 

## 2018-09-19 NOTE — ED Notes (Signed)
Dr Corky Downs made aware of pt's c/o's at this time regarding s/x's following orthostatic VS. Orders being entered at this time by Dr Corky Downs for anti-nausea medication and NS fluid bolus.

## 2018-09-19 NOTE — ED Notes (Signed)
Pt returned to ED Rm 8 from x-ray at this time.

## 2018-09-19 NOTE — ED Notes (Signed)
Immediately following the standing portion of the Orthostatic VS, pt began to c/o nausea and wanted to sit. Pt denies feeling lightheaded or dizzy, but stated he "needs to lie down [and] feel sick to my stomach".

## 2018-09-19 NOTE — ED Triage Notes (Addendum)
Pt arrives to ED via [facility] from home with c/o syncopal episode while in the shower. EMS reports pt not feeling well all day and passed out. Pt denies head injury, pt denies any c/o pain. EMS reports pt with 1 episode of emesis following LOC. Pt is A&O, in NAD; RR even, regular, and unlabored. Per EMS pt with previous h/x of heart "conduction issues" and followed by Cardiology. EMS also reports giving 217mL NS IV en route.

## 2018-09-19 NOTE — ED Notes (Signed)
Wife and daughter at bedside at this time. Spouse states pt was unconscious for approximately 1 minute.

## 2018-09-19 NOTE — Discharge Instructions (Addendum)

## 2018-09-19 NOTE — ED Provider Notes (Signed)
[facility] Emergency Department Provider Note   ____________________________________________    I have reviewed the triage vital signs and the nursing notes.   HISTORY  Chief Complaint Loss of Consciousness     HPI Dylan [name] is a 84 y.o. male who presents after a syncopal episode.  Patient was getting out of the shower when apparently he had a syncopal episode.  He denies chest pain or palpitations.  No shortness of breath.  No injury reported.  He is on blood thinners.  He does have a history of syncope and was actually being considered for a pacemaker but cardiology told him that he likely would not need it.  He has follow-up with cardiology in 3 days.  Currently feels well and has no complaints.  Past Medical History:  Diagnosis Date  . Cellulitis    due to nail impaction thru boot L  . Hyperlipidemia 12/1999    Patient Active Problem List   Diagnosis Date Noted  . Health maintenance examination 08/03/2018  . Advanced care planning/counseling discussion 08/03/2018  . Vitamin B12 deficiency 08/03/2018  . Bilateral hearing loss 08/03/2018  . Syncope 06/22/2018  . Vitamin D deficiency 05/19/2018  . Fall 05/19/2018  . Mild cognitive impairment with memory loss 05/19/2018  . Nuclear sclerosis, left 04/01/2017  . 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 Thrombocytopenia ([place]) 02/21/2011  . Dyslipidemia 02/04/2008  . 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 ERECTILE DYSFUNCTION, MILD 02/03/2007  . HYDROCELE, LEFT 02/03/2007    Past Surgical History:  Procedure Laterality Date  . BASAL CELL CARCINOMA EXCISION Right 01/2015   with reconstructive surgery around eye  . CATARACT EXTRACTION Left 03/2017  . 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 KNEE ARTHROSCOPY  1993   Right  . KNEE ARTHROSCOPY  01/2001   Left    Prior to Admission medications   Medication Sig Start Date  End Date Taking? Authorizing Provider  Cholecalciferol (VITAMIN D3) 1000 units CAPS Take 1 capsule (1,000 Units total) by mouth daily. 08/03/18  Yes [name], MD  Cyanocobalamin (B-12) 1000 MCG SUBL Place 1 tablet under the tongue daily. 08/03/18  Yes [name], MD  memantine (NAMENDA) 5 MG tablet Take 1 tablet (5 mg total) by mouth daily. 08/03/18  Yes [name], MD  Cyanocobalamin 1000 MCG/ML KIT Inject 1,000 mcg as directed as directed.    [provider]     Allergies Lincomycin hcl  Family History  Problem Relation Age of Onset  . Congestive Heart Failure Mother   . Stroke Father 40  . Congestive Heart Failure Father   . Leukemia Brother     Social History Social History   Tobacco Use  . Smoking status: Former Smoker    Types: Cigarettes    Last attempt to quit: 11/11/1960    Years since quitting: 57.8  . Smokeless tobacco: Never Used  Substance Use Topics  . Alcohol use: No  . Drug use: No    Review of Systems  Constitutional: No fever/chills Eyes: No visual changes.  ENT: No sore throat. Cardiovascular: As above Respiratory: Denies shortness of breath. Gastrointestinal: No abdominal pain.   Genitourinary: Negative for dysuria. Musculoskeletal: Negative for back pain. Skin: Negative for rash. Neurological: Negative for headaches or weakness   ____________________________________________   PHYSICAL EXAM:  VITAL SIGNS: ED Triage Vitals  Enc Vitals Group     BP 09/19/18 2014 114/66  Pulse Rate 09/19/18 2014 60     Resp 09/19/18 2014 17     Temp 09/19/18 2014 97.6 F (36.4 C)     Temp Source 09/19/18 2014 Oral     SpO2 09/19/18 2014 97 %     Weight 09/19/18 2012 76.7 kg (169 lb)     Height 09/19/18 2012 1.854 m (_0 )     Head Circumference --      Peak Flow --      Pain Score 09/19/18 2012 0     Pain Loc --      Pain Edu? --      Excl. in [place]? --     Constitutional: Alert Eyes: Conjunctivae are normal.  Head:  Atraumatic Nose: No swelling or epistaxis  Neck:  Painless ROM no vertebral tenderness to palpation Cardiovascular: Normal rate, regular rhythm. Grossly normal heart sounds.  Good peripheral circulation.  No chest wall tenderness palpation Respiratory: Normal respiratory effort.  No retractions. Lungs CTAB. Gastrointestinal: Soft and nontender. No distention.   Musculoskeletal: No lower extremity tenderness nor edema.  Warm and well perfused Neurologic:  Normal speech and language. No gross focal neurologic deficits are appreciated.  Skin:  Skin is warm, dry and intact. No rash noted. Psychiatric: Mood and affect are normal. Speech and behavior are normal.  ____________________________________________   LABS (all labs ordered are listed, but only abnormal results are displayed)  Labs Reviewed  CBC - Abnormal; Notable for the following components:      Result Value   Platelets 118 (*)    All other components within normal limits  COMPREHENSIVE METABOLIC PANEL - Abnormal; Notable for the following components:   Glucose, Bld 139 (*)    Creatinine, Ser 1.38 (*)    GFR calc non Af Amer 46 (*)    GFR calc Af Amer 53 (*)    All other components within normal limits  TROPONIN I   ____________________________________________  EKG  ED ECG REPORT I, Lavonia Drafts, the attending physician, personally viewed and interpreted this ECG.  Date: 09/19/2018  Rhythm: normal sinus rhythm QRS Axis: normal Intervals: Right bundle branch block ST/T Wave abnormalities: normal Narrative Interpretation: no evidence of acute ischemia  ____________________________________________  RADIOLOGY  Chest x-ray unremarkable ____________________________________________   PROCEDURES  Procedure(s) performed: No  Procedures   Critical Care performed: No ____________________________________________   INITIAL IMPRESSION / ASSESSMENT AND PLAN / ED COURSE  Pertinent labs & imaging results that  were available during my care of the patient were reviewed by me and considered in my medical decision making (see chart for details).  Patient well-appearing in no acute distress.  Presents for syncopal episode.  No chest pain to suggest ACS, no pleurisy or shortness of breath to suggest PE.  History of multiple falls related to syncope.  Has outpatient work-up scheduled.  Will check labs, orthostatics and reevaluate  Orthostatic vital signs were unremarkable with the patient felt mildly lightheaded, we will give IV fluids, IV Zofran recheck orthostatics    ____________________________________________   FINAL CLINICAL IMPRESSION(S) / ED DIAGNOSES  Final diagnoses:  Syncope and collapse        Note:  This document was prepared using Dragon voice recognition software and may include unintentional dictation errors.    Lavonia Drafts, MD 09/19/18 2308

## 2018-09-19 NOTE — ED Provider Notes (Signed)
-----------------------------------------   11:10 PM on 09/19/2018 -----------------------------------------   Assuming care from Dr. Corky Downs.  In short, Dylan [name] is a 84 y.o. male with a chief complaint of syncope.  Refer to the original H&P for additional details.  The current plan of care is for the patient to get some IV fluids and then recheck orthostatics.     ----------------------------------------- 2:44 AM on 09/20/2018 -----------------------------------------   (Note that documentation was delayed due to multiple ED patients requiring immediate care.)   The patient has tolerated oral intake and received IV fluids as per the order from Dr. Corky Downs.  I reassessed him and he feels well and is in good spirits.  He states that he did not feel lightheaded or dizzy when he stood up and walked around.  He is comfortable with the plan for discharge and outpatient follow-up.  He has an appointment scheduled in 2 to 3 days with his cardiologist.  I gave my usual and customary return precautions.    Hinda Kehr, MD 09/20/18 [phone]

## 2018-09-21 ENCOUNTER — Telehealth: Payer: Self-pay | Admitting: Family Medicine

## 2018-09-21 ENCOUNTER — Encounter: Payer: Self-pay | Admitting: Cardiology

## 2018-09-21 NOTE — Telephone Encounter (Signed)
Noted, records reviewed. Thank you.

## 2018-09-21 NOTE — Telephone Encounter (Signed)
Pt wife called to let you know Moyses had another episode Saturday. He fell in the bathroom after passing out. He was unconscious for a few minutes so she called EMS. He has appt with Dr. Raiford [facility] cardiology tomorrow.

## 2018-09-22 ENCOUNTER — Encounter: Payer: Self-pay | Admitting: Cardiology

## 2018-09-22 ENCOUNTER — Ambulatory Visit: Payer: Medicare Other | Admitting: Cardiology

## 2018-09-22 VITALS — BP 110/58 | HR 64 | Ht 73.0 in | Wt 184.0 lb

## 2018-09-22 DIAGNOSIS — R55 Syncope and collapse: Secondary | ICD-10-CM

## 2018-09-22 NOTE — Progress Notes (Signed)
Electrophysiology Office Note   Date:  09/22/2018   ID:  Dylan [name], DOB 23-Jul-1934, MRN [id number]  PCP:  [name], MD  Cardiologist:  Bettina Gavia Primary Electrophysiologist:  [name] [name], MD    No chief complaint on file.    History of Present Illness: Dylan [name] is a 84 y.o. male who is being seen today for the evaluation of syncope at the request of Shirlee More. Presenting today for electrophysiology evaluation.  He has a history of hyperlipidemia.  He had an episode of headedness but no syncope.  No palpitations or chest pain.  He wore a 14-day monitor that showed PACs but no symptomatic or bradycardic events.  He has had 3 events in the last 2 years.  Each event has occurred fairly rapidly.  He has fallen forward one time, and the other 2 he has been able to sit down in a chair.  He does have dizziness, weakness, and fatigue prior to his episodes.  He wore a 14-day monitor and had similar presyncopal episodes without any major abnormality found on his monitor.  Today, denies symptoms of palpitations, chest pain, shortness of breath, orthopnea, PND, lower extremity edema, claudication, dizziness, presyncope, syncope, bleeding, or neurologic sequela. The patient is tolerating medications without difficulties.  He is continued to have episodes of syncope.  His most recent episode he was getting out of the shower.  He did not feel weak or fatigued.  He presented to the emergency room who did not find any major abnormality.  He felt well after his episode of syncope.  He did not have palpitations.   Past Medical History:  Diagnosis Date  . Cellulitis    due to nail impaction thru boot L  . Hyperlipidemia 12/1999   Past Surgical History:  Procedure Laterality Date  . BASAL CELL CARCINOMA EXCISION Right 01/2015   with reconstructive surgery around eye  . CATARACT EXTRACTION Left 03/2017  . 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 KNEE  ARTHROSCOPY  1993   Right  . KNEE ARTHROSCOPY  01/2001   Left     Current Outpatient Medications  Medication Sig Dispense Refill  . Cholecalciferol (VITAMIN D3) 1000 units CAPS Take 1 capsule (1,000 Units total) by mouth daily. 30 capsule   . Cyanocobalamin (B-12) 1000 MCG SUBL Place 1 tablet under the tongue daily. 30 each   . Cyanocobalamin 1000 MCG/ML KIT Inject 1,000 mcg as directed as directed.    . memantine (NAMENDA) 5 MG tablet Take 1 tablet (5 mg total) by mouth daily. 30 tablet 6   No current facility-administered medications for this visit.     Allergies:   Lincomycin hcl   Social History:  The patient  reports that he quit smoking about 57 years ago. His smoking use included cigarettes. He has never used smokeless tobacco. He reports that he does not drink alcohol or use drugs.   Family History:  The patient's family history includes Congestive Heart Failure in his father and mother; Leukemia in his brother; Stroke (age of onset: 37) in his father.   ROS:  Please see the history of present illness.   Otherwise, review of systems is positive for nausea, passing out.   All other systems are reviewed and negative.   PHYSICAL EXAM: VS:  BP (!) 110/58   Pulse 64   Ht _0  (1.854 m)   Wt 184 lb (83.5 kg)   SpO2 95%   BMI  24.28 kg/m  , BMI Body mass index is 24.28 kg/m. GEN: Well nourished, well developed, in no acute distress  HEENT: normal  Neck: no JVD, carotid bruits, or masses Cardiac: RRR; no murmurs, rubs, or gallops,no edema  Respiratory:  clear to auscultation bilaterally, normal work of breathing GI: soft, nontender, nondistended, + BS MS: no deformity or atrophy  Skin: warm and dry Neuro:  Strength and sensation are intact Psych: euthymic mood, full affect  EKG:  EKG is not ordered today. Personal review of the ekg ordered 09/19/18 shows sinus rhythm, right bundle branch block, fascicular block  Recent Labs: 06/22/2018: TSH 1.36 09/19/2018: ALT 17; BUN  13; Creatinine, Ser 1.38; Hemoglobin 16.6; Platelets 118; Potassium 3.7; Sodium 139    Lipid Panel     Component Value Date/Time   CHOL 181 07/29/2018 1207   TRIG 220.0 (H) 07/29/2018 1207   HDL 30.10 (L) 07/29/2018 1207   CHOLHDL 6 07/29/2018 1207   VLDL 44.0 (H) 07/29/2018 1207   LDLCALC 114 (H) 02/18/2011 0815   LDLDIRECT 117.0 07/29/2018 1207     Wt Readings from Last 3 Encounters:  09/22/18 184 lb (83.5 kg)  09/19/18 169 lb (76.7 kg)  09/03/18 183 lb (83 kg)      Other studies Reviewed: Additional studies/ records that were reviewed today include: ZIO monitor 07/20/18 - personally reviewed Review of the above records today demonstrates:  297 runs of APCs longest lasting 11.2 seconds with a rate of 122 bpm the fastest 5 complexes a rate of 169 bpm.  There are no episodes of atrial fibrillation or flutter. There were no episodes of sinus node or AV nodal block There are no symptomatic events    ASSESSMENT AND PLAN:  1.  Syncope: Continues to be unclear as to the cause of his syncope.  It could be all due to vasovagal issues.  Fortunately he has not had any injuries.  To further determine his episodes of syncope, we [name] plan to implant a Linq monitor.  Risks were discussed and include bleeding and infection.  The patient understands the risks and is agreed to the procedure.   Current medicines are reviewed at length with the patient today.   The patient does not have concerns regarding his medicines.  The following changes were made today:  none  Labs/ tests ordered today include:  No orders of the defined types were placed in this encounter.   Disposition:   FU with [name] 3 months  Signed, [name] [name], MD  09/22/2018 1:44 PM     [place] [address] [phone] (office) [phone] (fax)

## 2018-09-22 NOTE — Patient Instructions (Signed)
Medication Instructions:  Your physician recommends that you continue on your current medications as directed. Please refer to the Current Medication list given to you today.  * If you need a refill on your cardiac medications before your next appointment, please call your pharmacy.   Labwork: None ordered  Testing/Procedures: Your physician recommends you have an implantable loop recorder.    - Please arrive at the [facility], Entrance "A"  at [facility] at  ______     on  the day of your procedure. (The address is [address]).    - Wash your chest and neck with surgical scrub the evening before and the      morning of your procedure.  Rinse well. Please review the surgical scrub instruction sheet given to you.     Follow-Up: Your physician recommends that you schedule a wound check appointment in: 7 -10 days, after your procedure on ______, with device clinic.  Your physician recommends that you schedule a follow-up appointment in: 91 days, after your procedure on ______, with Dr. Curt Bears.  *Please note that any paperwork needing to be filled out by the provider will need to be addressed at the front desk prior to seeing the provider. Please note that any FMLA, disability or other documents regarding health condition is subject to a $25.00 charge that must be received prior to completion of paperwork in the form of a money order or check.  Thank you for choosing [facility]!!   Trinidad Curet, RN [phone]  Any Other Special Instructions Will Be Listed Below (If Applicable).   Implantable Loop Recorder Placement An implantable loop recorder is a small electronic device that is placed under the skin of your chest. It is about the size of an AA ("double A") battery. The device records the electrical activity of your heart over a long period of time. Your health care provider can download these recordings to monitor your heart. You may need an implantable  loop recorder if you have periods of abnormal heart activity (arrhythmias) or unexplained fainting (syncope) caused by a heart problem. Tell a health care provider about:  Any allergies you have.  All medicines you are taking, including vitamins, herbs, eye drops, creams, and over-the-counter medicines.  Any problems you or family members have had with anesthetic medicines.  Any blood disorders you have.  Any surgeries you have had.  Any medical conditions you have.  Whether you are pregnant or may be pregnant. What are the risks? Generally, this is a safe procedure. However, as with any procedure, problems may occur, including:  Infection.  Bleeding.  Allergic reactions to anesthetic medicines.  Damage to nerves or blood vessels.  Failure of the device to work. This could require another surgery to replace it.  What happens before the procedure?   You may have a physical exam, blood tests, and imaging tests of your heart, such as a chest X-ray.  Follow instructions from your health care provider about eating or drinking restrictions.  Ask your health care provider about: ? Changing or stopping your regular medicines. This is especially important if you are taking diabetes medicines or blood thinners. ? Taking medicines such as aspirin and ibuprofen. These medicines can thin your blood. Do not take these medicines before your procedure if your surgeon instructs you not to.  Ask your health care provider how your surgical site will be marked or identified.  You may be given antibiotic medicine to help prevent  infection.  Plan to have someone take you home after the procedure.  If you will be going home right after the procedure, plan to have someone with you for 24 hours.  Do not use any tobacco products, such as cigarettes, chewing tobacco, and e-cigarettes as told by your surgeon. If you need help quitting, ask your health care provider. What happens during the  procedure?  To reduce your risk of infection: ? Your health care team will wash or sanitize their hands. ? Your skin will be washed with soap.  An IV tube will be inserted into one of your veins.  You may be given an antibiotic medicine through the IV tube.  You may be given one or more of the following: ? A medicine to help you relax (sedative). ? A medicine to numb the area (local anesthetic).  A small cut (incision) will be made on the left side of your upper chest.  A pocket will be created under your skin.  The device will be placed in the pocket.  The incision will be closed with stitches (sutures) or adhesive strips.  A bandage (dressing) will be placed over the incision. The procedure may vary among health care providers and hospitals. What happens after the procedure?  Your blood pressure, heart rate, breathing rate, and blood oxygen level will be monitored often until the medicines you were given have worn off.  You may be able to go home on the day of your surgery. Before going home: ? Your health care provider will program your recorder. ? You will learn how to trigger your device with a handheld activator. ? You will learn how to send recordings to your health care provider. ? You will get an ID card for your device, and you will be told when to use it.  Do not drive for 24 hours if you received a sedative. This information is not intended to replace advice given to you by your health care provider. Make sure you discuss any questions you have with your health care provider. Document Released: 10/09/2015 Document Revised: 04/04/2016 Document Reviewed: 08/02/2015 Elsevier Interactive Patient Education  2018 [place] Placement, Care After Refer to this sheet in the next few weeks. These instructions provide you with information about caring for yourself after your procedure. Your health care provider may also give you more  specific instructions. Your treatment has been planned according to current medical practices, but problems sometimes occur. Call your health care provider if you have any problems or questions after your procedure. What can I expect after the procedure? After the procedure, it is common to have:  Soreness or pain near the cut from surgery (incision).  Some swelling or bruising near the incision.  Follow these instructions at home: Medicines  Take over-the-counter and prescription medicines only as told by your health care provider.  If you were prescribed an antibiotic medicine, take it as told by your health care provider. Do not stop taking the antibiotic even if you start to feel better. Bathing  Do not take baths, swim, or use a hot tub until your health care provider approves. Ask your health care provider if you can take showers. You may only be allowed to take sponge baths for bathing. Incision care  Follow instructions from your health care provider about how to take care of your incision. Make sure you: ? Wash your hands with soap and water before you change your bandage (dressing).  If soap and water are not available, use hand sanitizer. ? Change your dressing as told by your health care provider. ? Keep your dressing dry. ? Leave stitches (sutures), skin glue, or adhesive strips in place. These skin closures may need to stay in place for 2 weeks or longer. If adhesive strip edges start to loosen and curl up, you may trim the loose edges. Do not remove adhesive strips completely unless your health care provider tells you to do that.  Check your incision area every day for signs of infection. Check for: ? More redness, swelling, or pain. ? Fluid or blood. ? Warmth. ? Pus or a bad smell. Driving  If you received a sedative, do not drive for 24 hours after the procedure.  If you did not receive a sedative, ask your health care provider when it is safe to  drive. Activity  Return to your normal activities as told by your health care provider. Ask your health care provider what activities are safe for you.  Until your health care provider says it is safe: ? Do not lift anything that is heavier than 10 lb (4.5 kg). ? Do not do activities that involve lifting your arms over your head. General instructions   Follow instructions from your health care provider about how and when to use your implantable loop recorder.  Do not go through a metal detection gate, and do not let someone hold a metal detector over your chest. Show your ID card.  Do not have an MRI unless you check with your health care provider first.  Do not use any tobacco products, such as cigarettes, chewing tobacco, and e-cigarettes. Tobacco can delay healing. If you need help quitting, ask your health care provider.  Keep all follow-up visits as told by your health care provider. This is important. Contact a health care provider if:  You have more redness, swelling, or pain around your incision.  You have more fluid or blood coming from your incision.  Your incision feels warm to the touch.  You have pus or a bad smell coming from your incision.  You have a fever.  You have pain that is not relieved by your pain medicine.  You have triggered your device because of fainting (syncope) or because of a heartbeat that feels like it is racing, slow, fluttering, or skipping (palpitations). Get help right away if:  You have chest pain.  You have difficulty breathing. This information is not intended to replace advice given to you by your health care provider. Make sure you discuss any questions you have with your health care provider. Document Released: 10/09/2015 Document Revised: 04/04/2016 Document Reviewed: 08/02/2015 Elsevier Interactive Patient Education  [employer].

## 2018-09-23 ENCOUNTER — Telehealth: Payer: Self-pay | Admitting: Cardiology

## 2018-09-23 NOTE — Telephone Encounter (Signed)
New Message    Pt's wife calling states that the nurse was suppose to call them to schedule a procedure on Friday but have not heard anything

## 2018-09-25 ENCOUNTER — Encounter: Payer: Self-pay | Admitting: *Deleted

## 2018-09-25 NOTE — Telephone Encounter (Signed)
Wife and pt stopped by.  ILR scheduled for 11/20. Letter of instructions given to pt. Follow up appts scheduled before they left. Patient verbalized understanding and agreeable to plan.

## 2018-09-25 NOTE — Telephone Encounter (Signed)
Spoke to wife and apologized for not getting back with them before today.  Explained that I had to leave early due to not feeling well and this is my first day back. Offered to schedule LINQ for this afternoon, wife not sure they can do today anymore.  Offered next available, next Wednesday.  She is going to check and see if they can do that date.  She will stop by the office later today, after her ortho appt next door, and we will schedule then.

## 2018-09-30 ENCOUNTER — Other Ambulatory Visit: Payer: Self-pay

## 2018-09-30 ENCOUNTER — Other Ambulatory Visit: Payer: Self-pay | Admitting: Family Medicine

## 2018-09-30 ENCOUNTER — Ambulatory Visit (INDEPENDENT_AMBULATORY_CARE_PROVIDER_SITE_OTHER): Payer: Medicare Other

## 2018-09-30 ENCOUNTER — Ambulatory Visit (HOSPITAL_COMMUNITY)
Admission: RE | Admit: 2018-09-30 | Discharge: 2018-09-30 | Disposition: A | Discharge status: Home / Self Care | Payer: Medicare Other | Source: Ambulatory Visit | Attending: Cardiology | Admitting: Cardiology

## 2018-09-30 ENCOUNTER — Ambulatory Visit: Payer: Self-pay

## 2018-09-30 ENCOUNTER — Encounter (HOSPITAL_COMMUNITY): Payer: Self-pay | Admitting: Cardiology

## 2018-09-30 ENCOUNTER — Encounter (HOSPITAL_COMMUNITY)
Admission: RE | Disposition: A | Discharge status: Home / Self Care | Payer: Self-pay | Source: Ambulatory Visit | Attending: Cardiology

## 2018-09-30 DIAGNOSIS — E538 Deficiency of other specified B group vitamins: Secondary | ICD-10-CM

## 2018-09-30 DIAGNOSIS — Z85828 Personal history of other malignant neoplasm of skin: Secondary | ICD-10-CM | POA: Insufficient documentation

## 2018-09-30 DIAGNOSIS — I639 Cerebral infarction, unspecified: Secondary | ICD-10-CM | POA: Insufficient documentation

## 2018-09-30 DIAGNOSIS — Z9889 Other specified postprocedural states: Secondary | ICD-10-CM | POA: Insufficient documentation

## 2018-09-30 DIAGNOSIS — D696 Thrombocytopenia, unspecified: Secondary | ICD-10-CM

## 2018-09-30 DIAGNOSIS — Z9842 Cataract extraction status, left eye: Secondary | ICD-10-CM | POA: Diagnosis not present

## 2018-09-30 DIAGNOSIS — E785 Hyperlipidemia, unspecified: Secondary | ICD-10-CM | POA: Diagnosis not present

## 2018-09-30 DIAGNOSIS — Z87891 Personal history of nicotine dependence: Secondary | ICD-10-CM | POA: Insufficient documentation

## 2018-09-30 DIAGNOSIS — Z8249 Family history of ischemic heart disease and other diseases of the circulatory system: Secondary | ICD-10-CM | POA: Diagnosis not present

## 2018-09-30 DIAGNOSIS — R55 Syncope and collapse: Secondary | ICD-10-CM | POA: Diagnosis not present

## 2018-09-30 DIAGNOSIS — N289 Disorder of kidney and ureter, unspecified: Secondary | ICD-10-CM

## 2018-09-30 DIAGNOSIS — Z806 Family history of leukemia: Secondary | ICD-10-CM | POA: Insufficient documentation

## 2018-09-30 DIAGNOSIS — I6389 Other cerebral infarction: Secondary | ICD-10-CM | POA: Diagnosis not present

## 2018-09-30 DIAGNOSIS — Z881 Allergy status to other antibiotic agents status: Secondary | ICD-10-CM | POA: Diagnosis not present

## 2018-09-30 DIAGNOSIS — E559 Vitamin D deficiency, unspecified: Secondary | ICD-10-CM

## 2018-09-30 DIAGNOSIS — Z823 Family history of stroke: Secondary | ICD-10-CM | POA: Insufficient documentation

## 2018-09-30 HISTORY — PX: LOOP RECORDER INSERTION: EP1214

## 2018-09-30 SURGERY — LOOP RECORDER INSERTION

## 2018-09-30 MED ORDER — CYANOCOBALAMIN 1000 MCG/ML IJ SOLN
1000.0000 ug | Freq: Once | INTRAMUSCULAR | Status: AC
  Administered 2018-09-30: 1000 ug via INTRAMUSCULAR

## 2018-09-30 MED ORDER — LIDOCAINE-EPINEPHRINE 1 %-1:100000 IJ SOLN
INTRAMUSCULAR | Status: DC | PRN
  Administered 2018-09-30: 30 mL

## 2018-09-30 SURGICAL SUPPLY — 2 items
LOOP REVEAL LINQSYS (Prosthesis & Implant Heart) ×2 IMPLANT
PACK LOOP INSERTION (CUSTOM PROCEDURE TRAY) ×3 IMPLANT

## 2018-09-30 NOTE — Discharge Instructions (Signed)
Instructions re Loop, wound care, Given verbally and in writing.  Pt and wife verbalize understanding and deny further questions

## 2018-09-30 NOTE — H&P (Signed)
Dylan [name] has presented today for surgery, with the diagnosis of syncope.  The various methods of treatment have been discussed with the patient and family. After consideration of risks, benefits and other options for treatment, the patient has consented to  Procedure(s): LINQ implant as a surgical intervention .  Risks include but not limited to bleeding, infection, among others. The patient's history has been reviewed, patient examined, no change in status, stable for surgery.  I have reviewed the patient's chart and labs.  Questions were answered to the patient's satisfaction.    Yajayra Feldt Curt Bears, MD 09/30/2018 11:31 AM

## 2018-09-30 NOTE — Progress Notes (Signed)
Patient in office today for B12 injection. Tolerated injection well to right deltoid.

## 2018-10-02 ENCOUNTER — Telehealth: Payer: Self-pay

## 2018-10-02 NOTE — Telephone Encounter (Signed)
LMOVM reminding pt to send remote transmission.   

## 2018-10-02 NOTE — Telephone Encounter (Signed)
Received alert for 3 "pause" episodes--available ECG shows undersensing. Will review additional ECGs when manual transmission received.

## 2018-10-06 ENCOUNTER — Other Ambulatory Visit: Payer: Self-pay | Admitting: Family Medicine

## 2018-10-06 DIAGNOSIS — E538 Deficiency of other specified B group vitamins: Secondary | ICD-10-CM

## 2018-10-07 ENCOUNTER — Telehealth: Payer: Self-pay | Admitting: *Deleted

## 2018-10-07 ENCOUNTER — Other Ambulatory Visit (INDEPENDENT_AMBULATORY_CARE_PROVIDER_SITE_OTHER): Payer: Medicare Other

## 2018-10-07 ENCOUNTER — Ambulatory Visit (INDEPENDENT_AMBULATORY_CARE_PROVIDER_SITE_OTHER): Payer: Medicare Other | Admitting: *Deleted

## 2018-10-07 DIAGNOSIS — R55 Syncope and collapse: Secondary | ICD-10-CM

## 2018-10-07 DIAGNOSIS — D696 Thrombocytopenia, unspecified: Secondary | ICD-10-CM | POA: Diagnosis not present

## 2018-10-07 DIAGNOSIS — E538 Deficiency of other specified B group vitamins: Secondary | ICD-10-CM | POA: Diagnosis not present

## 2018-10-07 DIAGNOSIS — N289 Disorder of kidney and ureter, unspecified: Secondary | ICD-10-CM | POA: Diagnosis not present

## 2018-10-07 DIAGNOSIS — E559 Vitamin D deficiency, unspecified: Secondary | ICD-10-CM | POA: Diagnosis not present

## 2018-10-07 LAB — CBC WITH DIFFERENTIAL/PLATELET
Basophils Absolute: 0.1 10*3/uL (ref 0.0–0.1)
Basophils Relative: 1.2 % (ref 0.0–3.0)
Eosinophils Absolute: 0.1 10*3/uL (ref 0.0–0.7)
Eosinophils Relative: 1.1 % (ref 0.0–5.0)
HEMATOCRIT: 47.4 % (ref 39.0–52.0)
HEMOGLOBIN: 16.5 g/dL (ref 13.0–17.0)
LYMPHS PCT: 26.6 % (ref 12.0–46.0)
Lymphs Abs: 1.6 10*3/uL (ref 0.7–4.0)
MCHC: 34.9 g/dL (ref 30.0–36.0)
MCV: 93.5 fl (ref 78.0–100.0)
MONO ABS: 0.6 10*3/uL (ref 0.1–1.0)
Monocytes Relative: 10.1 % (ref 3.0–12.0)
Neutro Abs: 3.6 10*3/uL (ref 1.4–7.7)
Neutrophils Relative %: 61 % (ref 43.0–77.0)
Platelets: 132 10*3/uL — ABNORMAL LOW (ref 150.0–400.0)
RBC: 5.06 Mil/uL (ref 4.22–5.81)
RDW: 13.3 % (ref 11.5–15.5)
WBC: 6 10*3/uL (ref 4.0–10.5)

## 2018-10-07 LAB — CUP PACEART INCLINIC DEVICE CHECK
Date Time Interrogation Session: 20191127152646
Implantable Pulse Generator Implant Date: 20191120

## 2018-10-07 LAB — RENAL FUNCTION PANEL
Albumin: 4.3 g/dL (ref 3.5–5.2)
BUN: 14 mg/dL (ref 6–23)
CO2: 30 meq/L (ref 19–32)
CREATININE: 1.41 mg/dL (ref 0.40–1.50)
Calcium: 9.7 mg/dL (ref 8.4–10.5)
Chloride: 101 mEq/L (ref 96–112)
GFR: 51.06 mL/min — AB (ref >60.00)
Glucose, Bld: 130 mg/dL — ABNORMAL HIGH (ref 70–99)
PHOSPHORUS: 3.2 mg/dL (ref 2.3–4.6)
POTASSIUM: 4.4 meq/L (ref 3.5–5.1)
Sodium: 138 mEq/L (ref 135–145)

## 2018-10-07 LAB — VITAMIN B12: Vitamin B-12: 798 pg/mL (ref 211–911)

## 2018-10-07 LAB — VITAMIN D 25 HYDROXY (VIT D DEFICIENCY, FRACTURES): VITD: 41.11 ng/mL (ref 30.00–100.00)

## 2018-10-07 NOTE — Telephone Encounter (Signed)
Reviewed episodes in clinic at wound check. See 10/07/18 encounter.

## 2018-10-07 NOTE — Progress Notes (Signed)
Wound check appointment. Steri-strips removed. Wound without redness or edema. Incision edges approximated, wound well healed. Battery status: Good. R-waves 0.53mV. 1 symptom episodes-- Patient's wife pressed it by accident, ECG appears SR. No tachy episodes, 3 pause episodes-- ECG appears SR with undersensing, 0 brady episodes. 0 AF episodes (0% burden). Monthly summary reports and ROV with WC 01/05/2019.

## 2018-10-07 NOTE — Telephone Encounter (Signed)
Spoke with patient's wife (DPR) requesting manual transmission. Patient's wife reports they are at a doctors office right now and will do it when they get home. [facility] phone number to call if they have any questions or concerns. Patient's wife verbalized understanding and appreciation.

## 2018-10-11 ENCOUNTER — Encounter: Payer: Self-pay | Admitting: Family Medicine

## 2018-10-28 ENCOUNTER — Ambulatory Visit: Payer: Medicare Other | Admitting: Family Medicine

## 2018-10-28 ENCOUNTER — Encounter: Payer: Self-pay | Admitting: Family Medicine

## 2018-10-28 VITALS — BP 114/66 | HR 54 | Temp 97.8°F | Ht 71.25 in | Wt 186.5 lb

## 2018-10-28 DIAGNOSIS — E559 Vitamin D deficiency, unspecified: Secondary | ICD-10-CM | POA: Diagnosis not present

## 2018-10-28 DIAGNOSIS — I452 Bifascicular block: Secondary | ICD-10-CM

## 2018-10-28 DIAGNOSIS — R55 Syncope and collapse: Secondary | ICD-10-CM

## 2018-10-28 DIAGNOSIS — G3184 Mild cognitive impairment, so stated: Secondary | ICD-10-CM | POA: Diagnosis not present

## 2018-10-28 DIAGNOSIS — E538 Deficiency of other specified B group vitamins: Secondary | ICD-10-CM | POA: Diagnosis not present

## 2018-10-28 DIAGNOSIS — H9193 Unspecified hearing loss, bilateral: Secondary | ICD-10-CM

## 2018-10-28 MED ORDER — MEMANTINE HCL 5 MG PO TABS: ORAL_TABLET | ORAL | 3 refills | Status: DC

## 2018-10-28 MED ORDER — CYANOCOBALAMIN 1000 MCG/ML IJ SOLN
1000.0000 ug | Freq: Once | INTRAMUSCULAR | Status: AC
  Administered 2018-10-28: 1000 ug via INTRAMUSCULAR

## 2018-10-28 NOTE — Progress Notes (Signed)
BP 114/66 (BP Location: Left Arm, Patient Position: Sitting, Cuff Size: Normal)   Pulse (!) 54   Temp 97.8 F (36.6 C) (Oral)   Ht 5' 11.25" (1.81 m)   Wt 186 lb 8 oz (84.6 kg)   SpO2 96%   BMI 25.83 kg/m    CC: 3 mo f/u visit Subjective:    Patient ID: Dylan [name], male    DOB: 1934/02/20, 84 y.o.   MRN: [id number]  HPI: Dylan [name] is a 84 y.o. male presenting on 10/28/2018 for Follow-up (Here for 3 mo f/u. Wants to discuss Namenda. Pt is accompanied by his wife, Dylan [name]. )   Clyde implanted 09/2018 ([facility]) for recurrent episodes of syncope. Last syncopal episode was last month. Has not seen neurologist.   Has received B12 shots for the last few months. No significant memory improvement with B12 replacement. Also taking oral b12 daily.   Namenda started 17m nightly, tolerating well. Increase namenda to 561mtwice daily.   Saw audiologist - new hearing aides - but forgets to use.   Relevant past medical, surgical, family and social history reviewed and updated as indicated. Interim medical history since our last visit reviewed. Allergies and medications reviewed and updated. Outpatient Medications Prior to Visit  Medication Sig Dispense Refill  . Cholecalciferol (VITAMIN D3) 1000 units CAPS Take 1 capsule (1,000 Units total) by mouth daily. 30 capsule   . Cyanocobalamin (VITAMIN B12) 1000 MCG TBCR Take 1 tablet by mouth daily.    . Cyanocobalamin 1000 MCG/ML KIT Inject 1,000 mcg as directed as directed.    . memantine (NAMENDA) 5 MG tablet Take 1 tablet (5 mg total) by mouth daily. 30 tablet 6  . Cyanocobalamin (B-12) 1000 MCG SUBL Place 1 tablet under the tongue daily. (Patient not taking: Reported on 10/28/2018) 30 each    No facility-administered medications prior to visit.      Per HPI unless specifically indicated in ROS section below Review of Systems     Objective:    BP 114/66 (BP Location: Left Arm, Patient Position: Sitting, Cuff Size: Normal)   Pulse  (!) 54   Temp 97.8 F (36.6 C) (Oral)   Ht 5' 11.25" (1.81 m)   Wt 186 lb 8 oz (84.6 kg)   SpO2 96%   BMI 25.83 kg/m   Wt Readings from Last 3 Encounters:  10/28/18 186 lb 8 oz (84.6 kg)  09/30/18 185 lb (83.9 kg)  09/22/18 184 lb (83.5 kg)    Physical Exam Vitals signs and nursing note reviewed.  Constitutional:      General: He is not in acute distress.    Appearance: Normal appearance.  Cardiovascular:     Rate and Rhythm: Normal rate and regular rhythm.     Pulses: Normal pulses.     Heart sounds: Normal heart sounds. No murmur.  Pulmonary:     Effort: Pulmonary effort is normal. No respiratory distress.     Breath sounds: Normal breath sounds. No wheezing, rhonchi or rales.  Musculoskeletal:     Right lower leg: No edema.     Left lower leg: No edema.  Neurological:     Mental Status: He is alert.  Psychiatric:        Mood and Affect: Mood normal.        Behavior: Behavior normal.    Results for orders placed or performed in visit on 10/07/18  C[facility] P[facility]ART INCLINIC DEVICE CHECK  Result Value Ref Range   Date  Time Interrogation Session [id number]    Pulse Generator Manufacturer MERM    Pulse Gen Model [id number] Reveal LINQ    Pulse Gen Serial Number [id number]    Clinic Name [facility] Pulse Generator Type ICM/ILR    Implantable Pulse Generator Implant Date [id number]    Battery Status OK    Eval Rhythm VS 69    Lab Results  Component Value Date   [id number] 674 10/07/2018       Assessment & Plan:   Problem List Items Addressed This Visit    Vitamin D deficiency    Levels have improved on 1000 IU daily replacement.      Vitamin B12 deficiency    Significant improvement after 3 months of regular b12 replacement. Will stop shots, continue oral b12. Recheck levels at 3 mo f/u visit.       Relevant Medications   cyanocobalamin ((VITAMIN B-12)) injection 1,000 mcg (Completed)   Right bundle branch block with left anterior fascicular  block    Appreciate cards care. Now with LINQ implantable recorder to try and catch arrhythmia.       Recurrent syncope    Appreciate cards care.  If LINQ unrevealing, consider neurology eval.       Mild cognitive impairment with memory loss - Primary    Has tolerated namenda 54m nightly - will taper up over next 1.5 months to 166mbid. Update if trouble tolerating, RTC 2-3 mo f/u visit.  Avoid aricept in h/o ?abnormal cardiac conduction.       Bilateral hearing loss    S/p audiology evaluation, has new hearing aides.          Meds ordered this encounter  Medications  . cyanocobalamin ((VITAMIN B-12)) injection 1,000 mcg  . memantine (NAMENDA) 5 MG tablet    Sig: 29m54mwice daily for 2 weeks then 54m37m/29mg 69mfor 2 weeks then 54mg 629me daily    Dispense:  120 tablet    Refill:  3   No orders of the defined types were placed in this encounter.   Follow up plan: Return in about 3 months (around 01/27/2019) for follow up visit.  JavierRia Bush

## 2018-10-28 NOTE — Telephone Encounter (Signed)
See phone note from 10/07/18.

## 2018-10-28 NOTE — Assessment & Plan Note (Signed)
Appreciate cards care.  If LINQ unrevealing, consider neurology eval.

## 2018-10-28 NOTE — Assessment & Plan Note (Signed)
Appreciate cards care. Now with LINQ implantable recorder to try and catch arrhythmia.

## 2018-10-28 NOTE — Assessment & Plan Note (Signed)
Significant improvement after 3 months of regular b12 replacement. Will stop shots, continue oral b12. Recheck levels at 3 mo f/u visit.

## 2018-10-28 NOTE — Patient Instructions (Addendum)
B12 shot today. Now continue oral B12 1019mcg daily and ok to stop shots. We will recheck levels next blood work.  Increase namenda to 5mg  twice daily for 1-2 weeks then 10mg  in morning and 5mg  in evening for 2 weeks then 10mg  twice daily.  Return in 2-3 months for follow up visit.

## 2018-10-28 NOTE — Assessment & Plan Note (Signed)
Levels have improved on 1000 IU daily replacement.

## 2018-10-28 NOTE — Assessment & Plan Note (Signed)
S/p audiology evaluation, has new hearing aides.

## 2018-10-28 NOTE — Assessment & Plan Note (Addendum)
Has tolerated namenda 5mg  nightly - will taper up over next 1.5 months to 10mg  bid. Update if trouble tolerating, RTC 2-3 mo f/u visit.  Avoid aricept in h/o ?abnormal cardiac conduction.

## 2018-11-02 ENCOUNTER — Ambulatory Visit (INDEPENDENT_AMBULATORY_CARE_PROVIDER_SITE_OTHER): Payer: Medicare Other

## 2018-11-02 DIAGNOSIS — R55 Syncope and collapse: Secondary | ICD-10-CM

## 2018-11-02 LAB — CUP PACEART REMOTE DEVICE CHECK
Date Time Interrogation Session: 20191223163618
Implantable Pulse Generator Implant Date: 20191120

## 2018-11-03 NOTE — Progress Notes (Signed)
Carelink Summary Report / Loop Recorder 

## 2018-11-05 ENCOUNTER — Telehealth: Payer: Self-pay

## 2018-11-05 MED ORDER — MEMANTINE HCL 10 MG PO TABS: 10.0000 mg | ORAL_TABLET | Freq: Two times a day (BID) | ORAL | 3 refills | Status: DC

## 2018-11-05 NOTE — Telephone Encounter (Signed)
Dylan [name] from [place] said that ins will not pay for memantine 5 mg taking two tabs bid. Dylan [name] request change rx to Memantine 10 mg taking one tab po bid. Please advise.

## 2018-11-05 NOTE — Telephone Encounter (Signed)
Sent in

## 2018-11-12 NOTE — Progress Notes (Signed)
Cardiology Office Note:    Date:  11/13/2018   ID:  Dylan [name], DOB 1934-08-16, MRN [id number]  PCP:  [name], MD  Cardiologist:  Shirlee More, MD    Referring MD: [name], MD    ASSESSMENT:    1. Right bundle branch block with left anterior fascicular block   2. Recurrent syncope    PLAN:    In order of problems listed above:  1. At risk for heart block high degree he has implanted loop recorder if document will have a permanent pacemaker 2. No recurrence since loop recorder implanted   Next appointment: 6 months or sooner if he has documented bradycardia or recurrent syncope   Medication Adjustments/Labs and Tests Ordered: Current medicines are reviewed at length with the patient today.  Concerns regarding medicines are outlined above.  No orders of the defined types were placed in this encounter.  No orders of the defined types were placed in this encounter.   Chief Complaint  Patient presents with  . Loss of Consciousness    he has a loop recorder    History of Present Illness:    Dylan [name] is a 85 y.o. male with a hx of bifascicular heart block and syncope last seen 08/13/2018.  He was referred to EP and had an implanted loop recorder 09/30/2018. Compliance with diet, lifestyle and medications: yes  Since loop recorder is implanted has had no recurrent episodes his daughter is quite frustrated as she is somewhat traumatized by his last episode of syncope in the bathroom he was caught in the upright posture apparently had a profound episode with incontinence of stool and bowel and she had to call EMS for assistance to get him off the floor.  He is asymptomatic and has had no recurrence but she is in fear that her father be injured no edema shortness of breath chest pain palpitation or syncope  Past Medical History:  Diagnosis Date  . Cellulitis    due to nail impaction thru boot L  . Hyperlipidemia 12/1999    Past Surgical History:    Procedure Laterality Date  . BASAL CELL CARCINOMA EXCISION Right 01/2015   with reconstructive surgery around eye  . CATARACT EXTRACTION Left 03/2017  . ETT  09/2018   negative for ischemia, extremely poor exercise tolerance ([place])  . 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 KNEE ARTHROSCOPY  1993   Right  . KNEE ARTHROSCOPY  01/2001   Left  . LOOP RECORDER INSERTION N/A 09/30/2018   Procedure: LOOP RECORDER INSERTION;  Surgeon: Constance Haw, MD;  Location: [facility];  Service: Cardiovascular;  Laterality: N/A;    Current Medications: Current Meds  Medication Sig  . Cyanocobalamin (VITAMIN B12) 1000 MCG TBCR Take 1 tablet by mouth daily.  . memantine (NAMENDA) 10 MG tablet Take 1 tablet (10 mg total) by mouth 2 (two) times daily.     Allergies:   Lincomycin hcl   Social History   Socioeconomic History  . Marital status: Married    Spouse name: Not on file  . Number of children: 3  . Years of education: Not on file  . Highest education level: Not on file  Occupational History  . 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 Financial resource strain: Not on file  . Food insecurity:    Worry: Not on file  Inability: Not on file  . Transportation needs:    Medical: Not on file    Non-medical: Not on file  Tobacco Use  . Smoking status: Former Smoker    Types: Cigarettes    Last attempt to quit: 11/11/1960    Years since quitting: 58.0  . Smokeless tobacco: Never Used  Substance and Sexual Activity  . Alcohol use: No  . Drug use: No  . Sexual activity: Not on file  Lifestyle  . Physical activity:    Days per week: Not on file    Minutes per session: Not on file  . Stress: Not on file  Relationships  . Social connections:    Talks on phone: Not on file    Gets together: Not on file    Attends religious service: Not on file    Active member of club or  organization: Not on file    Attends meetings of clubs or organizations: Not on file    Relationship status: Not on file  Other Topics Concern  . 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 ROS:   Please see the history of present illness.    All other systems reviewed and are negative.  EKGs/Labs/Other Studies Reviewed:    The following studies were reviewed today:  Recent Labs: 06/22/2018: TSH 1.36 09/19/2018: ALT 17 10/07/2018: BUN 14; Creatinine, Ser 1.41; Hemoglobin 16.5; Platelets 132.0; Potassium 4.4; Sodium 138  Recent Lipid Panel    Component Value Date/Time   CHOL 181 07/29/2018 1207   TRIG 220.0 (H) 07/29/2018 1207   HDL 30.10 (L) 07/29/2018 1207   CHOLHDL 6 07/29/2018 1207   VLDL 44.0 (H) 07/29/2018 1207   LDLCALC 114 (H) 02/18/2011 0815   LDLDIRECT 117.0 07/29/2018 1207    Physical Exam:    VS:  BP 110/78 (BP Location: Right Arm, Patient Position: Sitting, Cuff Size: Normal)   Pulse 72   Ht 6\' 1"  (1.854 m)   Wt 183 lb 8 oz (83.2 kg)   SpO2 94%   BMI 24.21 kg/m     Wt Readings from Last 3 Encounters:  11/13/18 183 lb 8 oz (83.2 kg)  10/28/18 186 lb 8 oz (84.6 kg)  09/30/18 185 lb (83.9 kg)     GEN:  Well nourished, well developed in no acute distress HEENT: Normal NECK: No JVD; No carotid bruits LYMPHATICS: No lymphadenopathy CARDIAC: RRR, no murmurs, rubs, gallops RESPIRATORY:  Clear to auscultation without rales, wheezing or rhonchi  ABDOMEN: Soft, non-tender, non-distended MUSCULOSKELETAL:  No edema; No deformity  SKIN: Warm and dry NEUROLOGIC:  Alert and oriented x 3 PSYCHIATRIC:  Normal affect    Signed, Shirlee More, MD  11/13/2018 11:04 AM    [place]

## 2018-11-13 ENCOUNTER — Encounter: Payer: Self-pay | Admitting: Cardiology

## 2018-11-13 ENCOUNTER — Ambulatory Visit (INDEPENDENT_AMBULATORY_CARE_PROVIDER_SITE_OTHER): Payer: Medicare Other | Admitting: Cardiology

## 2018-11-13 VITALS — BP 110/78 | HR 72 | Ht 73.0 in | Wt 183.5 lb

## 2018-11-13 DIAGNOSIS — R55 Syncope and collapse: Secondary | ICD-10-CM | POA: Diagnosis not present

## 2018-11-13 DIAGNOSIS — I452 Bifascicular block: Secondary | ICD-10-CM

## 2018-11-13 NOTE — Patient Instructions (Signed)

## 2018-12-07 ENCOUNTER — Ambulatory Visit (INDEPENDENT_AMBULATORY_CARE_PROVIDER_SITE_OTHER): Payer: Medicare Other

## 2018-12-07 DIAGNOSIS — R55 Syncope and collapse: Secondary | ICD-10-CM | POA: Diagnosis not present

## 2018-12-08 LAB — CUP PACEART REMOTE DEVICE CHECK
Date Time Interrogation Session: 20200125171125
MDC IDC PG IMPLANT DT: 20191120

## 2018-12-08 NOTE — Progress Notes (Signed)
Carelink Summary Report / Loop Recorder 

## 2018-12-30 ENCOUNTER — Encounter: Payer: Self-pay | Admitting: *Deleted

## 2019-01-05 ENCOUNTER — Encounter: Payer: Medicare Other | Admitting: Cardiology

## 2019-01-05 NOTE — Progress Notes (Deleted)
Electrophysiology Office Note   Date:  01/05/2019   ID:  Dylan [name], Dylan [name] 13-Mar-1934, MRN [id number]  PCP:  [name], MD  Cardiologist:  Bettina Gavia Primary Electrophysiologist:  Antoin Dargis [name], MD    No chief complaint on file.    History of Present Illness: Dylan [name] is a 85 y.o. male who is being seen today for the evaluation of syncope at the request of Shirlee More. Presenting today for electrophysiology evaluation.  He has a history of hyperlipidemia.  He had an episode of headedness but no syncope.  No palpitations or chest pain.  He wore a 14-day monitor that showed PACs but no symptomatic or bradycardic events.  He has had 3 events in the last 2 years.  Each event has occurred fairly rapidly.  He has fallen forward one time, and the other 2 he has been able to sit down in a chair.  He does have dizziness, weakness, and fatigue prior to his episodes.  He wore a 14-day monitor and had similar presyncopal episodes without any major abnormality found on his monitor.  Today, denies symptoms of palpitations, chest pain, shortness of breath, orthopnea, PND, lower extremity edema, claudication, dizziness, presyncope, syncope, bleeding, or neurologic sequela. The patient is tolerating medications without difficulties. ***    Past Medical History:  Diagnosis Date  . Cellulitis    due to nail impaction thru boot L  . Hyperlipidemia 12/1999   Past Surgical History:  Procedure Laterality Date  . BASAL CELL CARCINOMA EXCISION Right 01/2015   with reconstructive surgery around eye  . CATARACT EXTRACTION Left 03/2017  . ETT  09/2018   negative for ischemia, extremely poor exercise tolerance ([place])  . 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 KNEE ARTHROSCOPY  1993   Right  . KNEE ARTHROSCOPY  01/2001   Left  . LOOP RECORDER INSERTION N/A 09/30/2018   Procedure: LOOP RECORDER INSERTION;  Surgeon: Constance Haw, MD;  Location: [facility];  Service: Cardiovascular;  Laterality: N/A;     Current Outpatient Medications  Medication Sig Dispense Refill  . Cholecalciferol (VITAMIN D3) 1000 units CAPS Take 1 capsule (1,000 Units total) by mouth daily. 30 capsule   . Cyanocobalamin (B-12) 1000 MCG SUBL Place 1 tablet under the tongue daily. (Patient not taking: Reported on 11/13/2018) 30 each   . Cyanocobalamin (VITAMIN B12) 1000 MCG TBCR Take 1 tablet by mouth daily.    . memantine (NAMENDA) 10 MG tablet Take 1 tablet (10 mg total) by mouth 2 (two) times daily. 60 tablet 3   No current facility-administered medications for this visit.     Allergies:   Lincomycin hcl   Social History:  The patient  reports that he quit smoking about 58 years ago. His smoking use included cigarettes. He has never used smokeless tobacco. He reports that he does not drink alcohol or use drugs.   Family History:  The patient's family history includes Cataracts in his sister; Congestive Heart Failure in his father and mother; Leukemia in his brother; Stroke (age of onset: 45) in his father.   ROS:  Please see the history of present illness.   Otherwise, review of systems is positive for ***.   All other systems are reviewed and negative.   PHYSICAL EXAM: VS:  There were no vitals taken for this visit. , BMI There is no height or weight on file to calculate BMI. GEN: Well nourished, well developed,  in no acute distress  HEENT: normal  Neck: no JVD, carotid bruits, or masses Cardiac: ***RRR; no murmurs, rubs, or gallops,no edema  Respiratory:  clear to auscultation bilaterally, normal work of breathing GI: soft, nontender, nondistended, + BS MS: no deformity or atrophy  Skin: warm and dry, ***device site well healed Neuro:  Strength and sensation are intact Psych: euthymic mood, full affect  EKG:  EKG {ACTION; IS/IS [id number]} ordered today. Personal review of the ekg ordered *** shows ***  ***Personal review of the device  interrogation today. Results in [facility]: 06/22/2018: TSH 1.36 09/19/2018: ALT 17 10/07/2018: BUN 14; Creatinine, Ser 1.41; Hemoglobin 16.5; Platelets 132.0; Potassium 4.4; Sodium 138    Lipid Panel     Component Value Date/Time   CHOL 181 07/29/2018 1207   TRIG 220.0 (H) 07/29/2018 1207   HDL 30.10 (L) 07/29/2018 1207   CHOLHDL 6 07/29/2018 1207   VLDL 44.0 (H) 07/29/2018 1207   LDLCALC 114 (H) 02/18/2011 0815   LDLDIRECT 117.0 07/29/2018 1207     Wt Readings from Last 3 Encounters:  11/13/18 183 lb 8 oz (83.2 kg)  10/28/18 186 lb 8 oz (84.6 kg)  09/30/18 185 lb (83.9 kg)      Other studies Reviewed: Additional studies/ records that were reviewed today include: ZIO monitor 07/20/18 - personally reviewed Review of the above records today demonstrates:  297 runs of APCs longest lasting 11.2 seconds with a rate of 122 bpm the fastest 5 complexes a rate of 169 bpm.  There are no episodes of atrial fibrillation or flutter. There were no episodes of sinus node or AV nodal block There are no symptomatic events    ASSESSMENT AND PLAN:  1.  Syncope: ***Continues to be unclear as to the cause of his syncope.  It could be all due to vasovagal issues.  Fortunately he has not had any injuries.  To further determine his episodes of syncope, we Jovann Luse plan to implant a Linq monitor.  Risks were discussed and include bleeding and infection.  The patient understands the risks and is agreed to the procedure.   Current medicines are reviewed at length with the patient today.   The patient does not have concerns regarding his medicines.  The following changes were made today:  ***  Labs/ tests ordered today include:  No orders of the defined types were placed in this encounter.   Disposition:   FU with Padraig Nhan *** months  Signed, Deen Deguia [name], MD  01/05/2019 10:19 AM     [facility] [address] [place] [address] [phone]  (office) [phone] (fax)

## 2019-01-07 ENCOUNTER — Ambulatory Visit (INDEPENDENT_AMBULATORY_CARE_PROVIDER_SITE_OTHER): Payer: Medicare Other | Admitting: *Deleted

## 2019-01-07 DIAGNOSIS — R55 Syncope and collapse: Secondary | ICD-10-CM | POA: Diagnosis not present

## 2019-01-08 ENCOUNTER — Telehealth: Payer: Self-pay | Admitting: Cardiology

## 2019-01-08 LAB — CUP PACEART REMOTE DEVICE CHECK
Date Time Interrogation Session: 20200227171139
MDC IDC PG IMPLANT DT: 20191120

## 2019-01-08 NOTE — Telephone Encounter (Signed)
Informed pt that I did not call. Informed that our scheduler may have called to arrange June OV w/ [facility].  Informed that I would have her call him next week to schedule. Pt agreeable to plan.

## 2019-01-08 NOTE — Telephone Encounter (Signed)
Follow Up: ° ° ° °Pt returning your call from earlier today. °

## 2019-01-13 ENCOUNTER — Encounter: Payer: Self-pay | Admitting: Family Medicine

## 2019-01-13 ENCOUNTER — Ambulatory Visit: Payer: Medicare Other | Admitting: Family Medicine

## 2019-01-13 VITALS — BP 120/76 | HR 64 | Temp 98.6°F | Ht 71.25 in | Wt 195.4 lb

## 2019-01-13 DIAGNOSIS — N183 Chronic kidney disease, stage 3 unspecified: Secondary | ICD-10-CM | POA: Insufficient documentation

## 2019-01-13 DIAGNOSIS — G3184 Mild cognitive impairment, so stated: Secondary | ICD-10-CM | POA: Diagnosis not present

## 2019-01-13 DIAGNOSIS — R55 Syncope and collapse: Secondary | ICD-10-CM | POA: Diagnosis not present

## 2019-01-13 DIAGNOSIS — I452 Bifascicular block: Secondary | ICD-10-CM

## 2019-01-13 DIAGNOSIS — E538 Deficiency of other specified B group vitamins: Secondary | ICD-10-CM | POA: Diagnosis not present

## 2019-01-13 HISTORY — DX: Chronic kidney disease, stage 3 unspecified: N18.30

## 2019-01-13 MED ORDER — MEMANTINE HCL ER 14 MG PO CP24: 14.0000 mg | ORAL_CAPSULE | Freq: Every day | ORAL | 3 refills | Status: DC

## 2019-01-13 NOTE — Progress Notes (Signed)
Carelink Summary Report / Loop Recorder 

## 2019-01-13 NOTE — Assessment & Plan Note (Signed)
He did have b12 shots monthly for 3 months, now on oral replacement for last 3 months. Will update vitamin B12 level to ensure maintaining levels with oral replacement.

## 2019-01-13 NOTE — Patient Instructions (Addendum)
Stop plain namenda twice daily. Start extended release namenda 14mg  once daily capsule.  Labs today.  Return in 3 months for follow up visit.

## 2019-01-13 NOTE — Assessment & Plan Note (Signed)
Appreciate cards care. No recurrent episodes. Denies h/o seizures or convulsions during episodes

## 2019-01-13 NOTE — Assessment & Plan Note (Signed)
Concern namenda 10mg  bid may be causing GI symptoms - loose stools, gassiness, stool urgency with incontinence. Will trial namenda XR 14mg . If unaffordable, will return to 5mg  bid IR dose. Pt/wife agree with plan.

## 2019-01-13 NOTE — Assessment & Plan Note (Signed)
Appreciate cards care. 

## 2019-01-13 NOTE — Progress Notes (Signed)
BP 120/76 (BP Location: Left Arm, Patient Position: Sitting, Cuff Size: Normal)   Pulse 64   Temp 98.6 F (37 C) (Oral)   Ht 5' 11.25" (1.81 m)   Wt 195 lb 7 oz (88.6 kg)   SpO2 96%   BMI 27.07 kg/m    CC: 3 mo f/u visit Subjective:    Patient ID: Dylan [name], male    DOB: 07-12-1934, 85 y.o.   MRN: [id number]  HPI: JEANCLAUDE WENTWORTH is a 85 y.o. male presenting on 01/13/2019 for Follow-up (Here for 3 mo f/u. Pt accompanied by his wife, [name]. 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 Due for B12 level today.   LINQ implantable recorder trying to catch arrhythmia. No recurrent syncope since loop recorder implanted. Never had seizure.   MCI with memory loss - on namenda 5mg  bid. Noticing increased and loose stools since we increased dose. Has had some bowel accidents (about once a week)     Relevant past medical, surgical, family and social history reviewed and updated as indicated. Interim medical history since our last visit reviewed. Allergies and medications reviewed and updated. Outpatient Medications Prior to Visit  Medication Sig Dispense Refill  . Cholecalciferol (VITAMIN D3) 1000 units CAPS Take 1 capsule (1,000 Units total) by mouth daily. 30 capsule   . Cyanocobalamin (B-12) 1000 MCG SUBL Place 1 tablet under the tongue daily. 30 each   . Cyanocobalamin (VITAMIN B12) 1000 MCG TBCR Take 1 tablet by mouth daily.    . memantine (NAMENDA) 10 MG tablet Take 1 tablet (10 mg total) by mouth 2 (two) times daily. 60 tablet 3   No facility-administered medications prior to visit.      Per HPI unless specifically indicated in ROS section below Review of Systems Objective:    BP 120/76 (BP Location: Left Arm, Patient Position: Sitting, Cuff Size: Normal)   Pulse 64   Temp 98.6 F (37 C) (Oral)   Ht 5' 11.25" (1.81 m)   Wt 195 lb 7 oz (88.6 kg)   SpO2 96%   BMI 27.07 kg/m   Wt  Readings from Last 3 Encounters:  01/13/19 195 lb 7 oz (88.6 kg)  11/13/18 183 lb 8 oz (83.2 kg)  10/28/18 186 lb 8 oz (84.6 kg)    Physical Exam Vitals signs and nursing note reviewed.  Constitutional:      Appearance: Normal appearance.  HENT:     Mouth/Throat:     Mouth: Mucous membranes are moist.  Eyes:     Extraocular Movements: Extraocular movements intact.     Pupils: Pupils are equal, round, and reactive to light.  Cardiovascular:     Rate and Rhythm: Normal rate and regular rhythm.     Pulses: Normal pulses.     Heart sounds: Normal heart sounds. No murmur.  Pulmonary:     Effort: Pulmonary effort is normal. No respiratory distress.     Breath sounds: Normal breath sounds. No wheezing, rhonchi or rales.  Neurological:     Mental Status: He is alert.  Psychiatric:        Mood and Affect: Mood normal.       Assessment & Plan:   Problem List Items Addressed This Visit    Vitamin B12 deficiency    He did have b12 shots monthly for 3 months, now on oral replacement for last 3 months. Will  update vitamin B12 level to ensure maintaining levels with oral replacement.       Relevant Orders   Vitamin B12   Right bundle branch block with left anterior fascicular block    Appreciate cards care.       Recurrent syncope    Appreciate cards care. No recurrent episodes. Denies h/o seizures or convulsions during episodes      Mild cognitive impairment with memory loss - Primary    Concern namenda 10mg  bid may be causing GI symptoms - loose stools, gassiness, stool urgency with incontinence. Will trial namenda XR 14mg . If unaffordable, will return to 5mg  bid IR dose. Pt/wife agree with plan.       CKD (chronic kidney disease) stage 3, GFR 30-59 ml/min (HCC)    Reviewed with pt/wife. Borderline for last 7 years. Will continue to monitor.       Relevant Orders   Renal function panel       Meds ordered this encounter  Medications  . memantine (NAMENDA XR) 14 MG CP24  24 hr capsule    Sig: Take 1 capsule (14 mg total) by mouth daily.    Dispense:  30 capsule    Refill:  3   Orders Placed This Encounter  Procedures  . Vitamin B12  . Renal function panel    Follow up plan: Return in about 3 months (around 04/15/2019) for follow up visit.  [name], MD

## 2019-01-13 NOTE — Assessment & Plan Note (Signed)
Reviewed with pt/wife. Borderline for last 7 years. Will continue to monitor.

## 2019-01-14 LAB — RENAL FUNCTION PANEL
ALBUMIN: 4.1 g/dL (ref 3.5–5.2)
BUN: 13 mg/dL (ref 6–23)
CHLORIDE: 105 meq/L (ref 96–112)
CO2: 32 mEq/L (ref 19–32)
Calcium: 9 mg/dL (ref 8.4–10.5)
Creatinine, Ser: 1.28 mg/dL (ref 0.40–1.50)
GFR: 53.68 mL/min — ABNORMAL LOW (ref >60.00)
Glucose, Bld: 82 mg/dL (ref 70–99)
POTASSIUM: 4.6 meq/L (ref 3.5–5.1)
Phosphorus: 3.1 mg/dL (ref 2.3–4.6)
SODIUM: 141 meq/L (ref 135–145)

## 2019-01-14 LAB — VITAMIN B12: Vitamin B-12: 664 pg/mL (ref 211–911)

## 2019-02-09 ENCOUNTER — Ambulatory Visit (INDEPENDENT_AMBULATORY_CARE_PROVIDER_SITE_OTHER): Payer: Medicare Other | Admitting: *Deleted

## 2019-02-09 ENCOUNTER — Other Ambulatory Visit: Payer: Self-pay

## 2019-02-09 DIAGNOSIS — R55 Syncope and collapse: Secondary | ICD-10-CM

## 2019-02-09 LAB — CUP PACEART REMOTE DEVICE CHECK
Date Time Interrogation Session: 20200331152724
MDC IDC PG IMPLANT DT: 20191120

## 2019-02-17 NOTE — Progress Notes (Signed)
Carelink Summary Report / Loop Recorder 

## 2019-03-03 DIAGNOSIS — Z85828 Personal history of other malignant neoplasm of skin: Secondary | ICD-10-CM | POA: Diagnosis not present

## 2019-03-03 DIAGNOSIS — Z1283 Encounter for screening for malignant neoplasm of skin: Secondary | ICD-10-CM | POA: Diagnosis not present

## 2019-03-03 DIAGNOSIS — D2260 Melanocytic nevi of unspecified upper limb, including shoulder: Secondary | ICD-10-CM | POA: Diagnosis not present

## 2019-03-03 DIAGNOSIS — L820 Inflamed seborrheic keratosis: Secondary | ICD-10-CM | POA: Diagnosis not present

## 2019-03-03 DIAGNOSIS — D225 Melanocytic nevi of trunk: Secondary | ICD-10-CM | POA: Diagnosis not present

## 2019-03-15 ENCOUNTER — Other Ambulatory Visit: Payer: Self-pay

## 2019-03-15 ENCOUNTER — Ambulatory Visit (INDEPENDENT_AMBULATORY_CARE_PROVIDER_SITE_OTHER): Payer: Medicare Other | Admitting: *Deleted

## 2019-03-15 DIAGNOSIS — R55 Syncope and collapse: Secondary | ICD-10-CM

## 2019-03-15 LAB — CUP PACEART REMOTE DEVICE CHECK
Date Time Interrogation Session: 20200503183807
Implantable Pulse Generator Implant Date: 20191120

## 2019-03-22 NOTE — Progress Notes (Signed)
Carelink Summary Report / Loop Recorder 

## 2019-04-06 ENCOUNTER — Telehealth: Payer: Self-pay | Admitting: Student

## 2019-04-06 NOTE — Telephone Encounter (Signed)
Called patient to discuss alert from Linq monitor and to ask him to send a manual transmission.  LMOM

## 2019-04-07 NOTE — Telephone Encounter (Signed)
Discussed with pts wife. Pt has been feeling OK, other than stomach pain from constipation. No syncope.   Second episode has not been reviewed. Pt and wife aware and will send another transmission once they get home.

## 2019-04-07 NOTE — Telephone Encounter (Signed)
Received LINQ alert for tachy episodes x 1. Manual transmission per our request showed 3 additional. Pt denies symptoms. Has had stomach pain recently from constipation. No syncope, near syncope, and has not complained of palpitations per wife (pt has Dementia).    FYI for appt 04/12/2019.   Presenting:

## 2019-04-07 NOTE — Telephone Encounter (Signed)
Wife called back returning a call from the office. Wife requested we only use her number for contact, because her husband has Dementia

## 2019-04-08 ENCOUNTER — Telehealth: Payer: Self-pay | Admitting: *Deleted

## 2019-04-08 ENCOUNTER — Other Ambulatory Visit: Payer: Self-pay

## 2019-04-08 NOTE — Telephone Encounter (Signed)
Calling patient today to discuss upcoming appointment.  We are currently trying to limit exposure to the virus that causes COVID-19 by seeing patients at home rather than in the office. We would like to schedule this appointment as a Virtual Appointment VIA Smartphone or Laptop. Unable to reach patient.  LVMTCB  

## 2019-04-08 NOTE — Telephone Encounter (Signed)
Namenda XR Last rx:  01/13/19, #30 Last OV:  01/13/19, f/u Next OV:  04/21/19, 3 mo f/u

## 2019-04-08 NOTE — Telephone Encounter (Signed)
Appears atrial driven. If asymptomatic continue to monitor with no changes.

## 2019-04-08 NOTE — Telephone Encounter (Signed)
pts wife (DPR signed) left v/m requesting dementia med refilled. Pt has appt to see Dr Darnell Level on 04/21/19 but pt will be out of med on 04/16/19.

## 2019-04-09 MED ORDER — MEMANTINE HCL ER 14 MG PO CP24: 14.0000 mg | ORAL_CAPSULE | Freq: Every day | ORAL | 6 refills | Status: DC

## 2019-04-15 NOTE — Telephone Encounter (Signed)

## 2019-04-16 ENCOUNTER — Telehealth (INDEPENDENT_AMBULATORY_CARE_PROVIDER_SITE_OTHER): Payer: Medicare Other | Admitting: Cardiology

## 2019-04-16 ENCOUNTER — Encounter: Payer: Self-pay | Admitting: Cardiology

## 2019-04-16 ENCOUNTER — Other Ambulatory Visit: Payer: Self-pay

## 2019-04-16 ENCOUNTER — Ambulatory Visit (INDEPENDENT_AMBULATORY_CARE_PROVIDER_SITE_OTHER): Payer: Medicare Other | Admitting: *Deleted

## 2019-04-16 DIAGNOSIS — R55 Syncope and collapse: Secondary | ICD-10-CM | POA: Diagnosis not present

## 2019-04-16 LAB — CUP PACEART REMOTE DEVICE CHECK
Date Time Interrogation Session: 20200605174848
Implantable Pulse Generator Implant Date: 20191120

## 2019-04-16 NOTE — Progress Notes (Signed)
Electrophysiology TeleHealth Note   Due to national recommendations of social distancing due to COVID 19, an audio/video telehealth visit is felt to be most appropriate for this patient at this time.  See Epic message for the patient's consent to telehealth for [facility].   Date:  04/16/2019   ID:  Dylan [name], DOB Sep 17, 1934, MRN [id number]  Location: patient's home  Provider location: [address] [place]  Evaluation Performed: Follow-up visit  PCP:  [name], MD  Cardiologist:  [facility] Electrophysiologist:  Dr Curt Bears  Chief Complaint:  syncope  History of Present Illness:    Dylan [name] is a 85 y.o. male who presents via audio/video conferencing for a telehealth visit today.  Since last being seen in our clinic, the patient reports doing very well.  Today, he denies symptoms of palpitations, chest pain, shortness of breath,  lower extremity edema, dizziness, presyncope, or syncope.  The patient is otherwise without complaint today.  The patient denies symptoms of fevers, chills, cough, or new SOB worrisome for COVID 19.  He has a history of hyperlipidemia and syncope.  He has a Linq monitor implanted for episodes of syncope.  Today, denies symptoms of palpitations, chest pain, shortness of breath, orthopnea, PND, lower extremity edema, claudication, dizziness, presyncope, syncope, bleeding, or neurologic sequela. The patient is tolerating medications without difficulties.  He had an episode of SVT noted on his cardiac monitor.  Per the patient, he was feeling well at the time and this was not associated with syncope.  He has had a few episodes, according to his wife, where he is felt weak and fatigued.  He says that his color has changed during those episodes.  They have not sent in any recordings from those episodes.  Past Medical History:  Diagnosis Date  . Cellulitis    due to nail impaction thru boot L  . Hyperlipidemia 12/1999    Past  Surgical History:  Procedure Laterality Date  . BASAL CELL CARCINOMA EXCISION Right 01/2015   with reconstructive surgery around eye  . CATARACT EXTRACTION Left 03/2017  . ETT  09/2018   negative for ischemia, extremely poor exercise tolerance ([place])  . 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 KNEE ARTHROSCOPY  1993   Right  . KNEE ARTHROSCOPY  01/2001   Left  . LOOP RECORDER INSERTION N/A 09/30/2018   Procedure: LOOP RECORDER INSERTION;  Surgeon: Constance Haw, MD;  Location: [facility];  Service: Cardiovascular;  Laterality: N/A;    Current Outpatient Medications  Medication Sig Dispense Refill  . Cholecalciferol (VITAMIN D3) 1000 units CAPS Take 1 capsule (1,000 Units total) by mouth daily. 30 capsule   . Cyanocobalamin (B-12) 1000 MCG SUBL Place 1 tablet under the tongue daily. 30 each   . memantine (NAMENDA XR) 14 MG CP24 24 hr capsule Take 1 capsule (14 mg total) by mouth daily. 30 capsule 6   No current facility-administered medications for this visit.     Allergies:   Lincomycin hcl   Social History:  The patient  reports that he quit smoking about 58 years ago. His smoking use included cigarettes. He has never used smokeless tobacco. He reports that he does not drink alcohol or use drugs.   Family History:  The patient's  family history includes Cataracts in his sister; Congestive Heart Failure in his father and mother; Leukemia in his brother; Stroke (age of onset: 63) in his  father.   ROS:  Please see the history of present illness.   All other systems are personally reviewed and negative.    Exam:    Vital Signs:  BP (!) 115/57   Pulse 60   Over the phone, no acute distress, no shortness of breath.  Labs/Other Tests and Data Reviewed:    Recent Labs: 06/22/2018: TSH 1.36 09/19/2018: ALT 17 10/07/2018: Hemoglobin 16.5; Platelets 132.0 01/13/2019: BUN 13; Creatinine, Ser 1.28; Potassium 4.6; Sodium 141   Wt Readings from  Last 3 Encounters:  01/13/19 195 lb 7 oz (88.6 kg)  11/13/18 183 lb 8 oz (83.2 kg)  10/28/18 186 lb 8 oz (84.6 kg)     Other studies personally reviewed: Additional studies/ records that were reviewed today include: ECG 09/19/2018 personally reviewed Review of the above records today demonstrates: Sinus rhythm, right bundle branch block, left anterior fascicular block  Last device remote is reviewed from [place] PDF dated 03/15/19 which reveals normal device function, no arrhythmias    ASSESSMENT & PLAN:    1.  Syncope: Status post Linq monitor.  Linq monitor showed short episodes of SVT, but these were not associated with any syncope.  He is having episodes better associated with fatigue and pallor.  I have asked his wife to send in recordings from these episodes.  2.  SVT: Asymptomatic.  Has a low heart rate at baseline.  No changes.  COVID 19 screen The patient denies symptoms of COVID 19 at this time.  The importance of social distancing was discussed today.  Follow-up: Pending link monitor results  Current medicines are reviewed at length with the patient today.   The patient does not have concerns regarding his medicines.  The following changes were made today:  none  Labs/ tests ordered today include:  No orders of the defined types were placed in this encounter.    Patient Risk:  after full review of this patients clinical status, I feel that they are at moderate risk at this time.  Today, I have spent 8 minutes with the patient with telehealth technology discussing SVT, syncope.    Signed, Will [name], MD  04/16/2019 3:01 PM     [address] [place] [address] [phone] (office) [phone] (fax)

## 2019-04-21 ENCOUNTER — Ambulatory Visit: Payer: Medicare Other | Admitting: Family Medicine

## 2019-04-23 NOTE — Progress Notes (Signed)
Carelink Summary Report / Loop Recorder 

## 2019-04-28 ENCOUNTER — Telehealth: Payer: Self-pay

## 2019-04-28 NOTE — Telephone Encounter (Signed)
Spoke to pt wife, pt did not mention any symptoms that correlate with episode. Informed wife to call if pt has any symptoms such as dizziness or syncope. Tachy episodes appear similar to previous episodes, thought to be atrially driven per Dr. Curt Bears.

## 2019-05-11 ENCOUNTER — Encounter: Payer: Self-pay | Admitting: Family Medicine

## 2019-05-11 ENCOUNTER — Other Ambulatory Visit: Payer: Self-pay

## 2019-05-11 ENCOUNTER — Ambulatory Visit (INDEPENDENT_AMBULATORY_CARE_PROVIDER_SITE_OTHER): Payer: Medicare Other | Admitting: Family Medicine

## 2019-05-11 VITALS — BP 118/74 | HR 63 | Temp 97.6°F | Ht 71.25 in | Wt 192.4 lb

## 2019-05-11 DIAGNOSIS — E538 Deficiency of other specified B group vitamins: Secondary | ICD-10-CM

## 2019-05-11 DIAGNOSIS — G3184 Mild cognitive impairment, so stated: Secondary | ICD-10-CM

## 2019-05-11 DIAGNOSIS — R55 Syncope and collapse: Secondary | ICD-10-CM | POA: Diagnosis not present

## 2019-05-11 NOTE — Assessment & Plan Note (Signed)
Maintaining levels on oral replacement.

## 2019-05-11 NOTE — Assessment & Plan Note (Signed)
Appreciate cards care s/p Linq implantable monitor. No recurrent syncope recently. Denies h/o seizures or convulsions during these episodes.

## 2019-05-11 NOTE — Progress Notes (Signed)
This visit was conducted in person.  BP 118/74 (BP Location: Right Arm, Patient Position: Sitting, Cuff Size: Normal)   Pulse 63   Temp 97.6 F (36.4 C) (Tympanic)   Ht 5' 11.25" (1.81 m)   Wt 192 lb 7 oz (87.3 kg)   SpO2 97%   BMI 26.65 kg/m    CC: 3 mo f/u visit Subjective:    Patient ID: Dylan [name], male    DOB: 08-23-1934, 85 y.o.   MRN: [id number]  HPI: Dylan [name] is a 85 y.o. male presenting on 05/11/2019 for Follow-up (Here for 3 mo f/u.  Pt accompanied by wife, [name].)   See prior notes for details. Last visit we changed from plain namenda to extended release due to concern over loosening stools with namenda IR. This has significantly helped GI issues. Noticing tremor of R hand. Struggles with changes in routine. Good appetite. Can overeat when wife is at work. Mainly eats and swatches TV. Perseverates on things - walking in circles in the kitchen, counting tiles, or thinking about things.   H/o syncope s/p Linq monitor showing short SVT without syncope. Followed by cardiology. No further falls or syncope.   Denies dizziness, headaches, dyspnea.  Never seizure symptoms. No tongue biting or bladder incontinence.      Relevant past medical, surgical, family and social history reviewed and updated as indicated. Interim medical history since our last visit reviewed. Allergies and medications reviewed and updated. Outpatient Medications Prior to Visit  Medication Sig Dispense Refill  . Cholecalciferol (VITAMIN D3) 1000 units CAPS Take 1 capsule (1,000 Units total) by mouth daily. 30 capsule   . Cyanocobalamin (B-12) 1000 MCG SUBL Place 1 tablet under the tongue daily. 30 each   . memantine (NAMENDA XR) 14 MG CP24 24 hr capsule Take 1 capsule (14 mg total) by mouth daily. 30 capsule 6   No facility-administered medications prior to visit.      Per HPI unless specifically indicated in ROS section below Review of Systems Objective:    BP 118/74 (BP Location:  Right Arm, Patient Position: Sitting, Cuff Size: Normal)   Pulse 63   Temp 97.6 F (36.4 C) (Tympanic)   Ht 5' 11.25" (1.81 m)   Wt 192 lb 7 oz (87.3 kg)   SpO2 97%   BMI 26.65 kg/m   Wt Readings from Last 3 Encounters:  05/11/19 192 lb 7 oz (87.3 kg)  01/13/19 195 lb 7 oz (88.6 kg)  11/13/18 183 lb 8 oz (83.2 kg)    Physical Exam Vitals signs and nursing note reviewed.  Constitutional:      Appearance: Normal appearance. He is not ill-appearing.  HENT:     Head: Normocephalic and atraumatic.     Right Ear: Tympanic membrane and ear canal normal.     Left Ear: Tympanic membrane and ear canal normal.     Mouth/Throat:     Mouth: Mucous membranes are moist.     Pharynx: No posterior oropharyngeal erythema.  Eyes:     Extraocular Movements: Extraocular movements intact.     Pupils: Pupils are equal, round, and reactive to light.  Neck:     Vascular: No carotid bruit.  Cardiovascular:     Rate and Rhythm: Normal rate and regular rhythm.     Pulses: Normal pulses.     Heart sounds: Normal heart sounds. No murmur.  Pulmonary:     Effort: Pulmonary effort is normal. No respiratory distress.     Breath  sounds: Normal breath sounds. No wheezing, rhonchi or rales.  Neurological:     General: No focal deficit present.     Mental Status: He is alert.  Psychiatric:        Mood and Affect: Mood normal.         Lab Results  Component Value Date   [id number] 833 01/13/2019   Assessment & Plan:   Problem List Items Addressed This Visit    Vitamin B12 deficiency    Maintaining levels on oral replacement.      Recurrent syncope    Appreciate cards care s/p Linq implantable monitor. No recurrent syncope recently. Denies h/o seizures or convulsions during these episodes.       Mild cognitive impairment with memory loss    Stable period on namenda XR 14mg  daily. Wife notices some perseverating and possible OCD tendencies ?med related. Will continue at current dose.            No orders of the defined types were placed in this encounter.  No orders of the defined types were placed in this encounter.   Follow up plan: Return in about 3 months (around 08/11/2019) for annual exam, prior fasting for blood work.  [name], MD

## 2019-05-11 NOTE — Patient Instructions (Addendum)
No changes for now - continue narmenda XR 14mg  daily. Keep follow up appointment for physical at end of September.  Call us sooner if any questions or concerns.

## 2019-05-11 NOTE — Assessment & Plan Note (Signed)
Stable period on namenda XR 14mg  daily. Wife notices some perseverating and possible OCD tendencies ?med related. Will continue at current dose.

## 2019-05-17 ENCOUNTER — Telehealth: Payer: Self-pay | Admitting: Student

## 2019-05-17 NOTE — Telephone Encounter (Signed)
LMOM.   Pt needs to send manual transmission due to continue atrial driven high rates.    Legrand Como [address] [place], PA-C 05/17/2019 9:35 AM

## 2019-05-18 NOTE — Telephone Encounter (Signed)
I called the pt to ask for a manual transmission with his home monitor but the gentleman that answered told me no I can not speak with the pt. I asked him was he a relative of the pt and he did not answer. I was not getting any progress with whomever answered I hung up the phone and called his wife. I left a message on his wife phone for the pt to send a transmission. I gave her my direct office number to call if she had any questions.

## 2019-05-19 ENCOUNTER — Ambulatory Visit (INDEPENDENT_AMBULATORY_CARE_PROVIDER_SITE_OTHER): Payer: Medicare Other | Admitting: *Deleted

## 2019-05-19 DIAGNOSIS — R55 Syncope and collapse: Secondary | ICD-10-CM | POA: Diagnosis not present

## 2019-05-20 LAB — CUP PACEART REMOTE DEVICE CHECK
Date Time Interrogation Session: 20200708204018
Implantable Pulse Generator Implant Date: 20191120

## 2019-05-20 NOTE — Telephone Encounter (Signed)
LMOM for pt to call office to determine if symptomatic with SVT episode April 17, 2019 @ 1153 that lasted 10 seconds. Hx of like episodes.

## 2019-05-21 NOTE — Telephone Encounter (Signed)
Spoke with wife per DPR. Pt had no sx during episode of SVT on 05/17/19. She did not notice any change in his appearance. No c/o cp or sob and no syncope. Hx of similar asymptomatic episodes reviewed by Dr Curt Bears. Pt has short term memory loss and wife requested that she be contacted with questions and concerns.

## 2019-05-21 NOTE — Telephone Encounter (Signed)
Patient wife returning call  °

## 2019-05-31 NOTE — Progress Notes (Signed)
Carelink Summary Report / Loop Recorder 

## 2019-06-21 ENCOUNTER — Ambulatory Visit (INDEPENDENT_AMBULATORY_CARE_PROVIDER_SITE_OTHER): Payer: Medicare Other | Admitting: *Deleted

## 2019-06-21 DIAGNOSIS — I452 Bifascicular block: Secondary | ICD-10-CM

## 2019-06-21 DIAGNOSIS — R55 Syncope and collapse: Secondary | ICD-10-CM

## 2019-06-21 LAB — CUP PACEART REMOTE DEVICE CHECK
Date Time Interrogation Session: 20200810213607
Implantable Pulse Generator Implant Date: 20191120

## 2019-06-29 NOTE — Progress Notes (Signed)
Carelink Summary Report / Loop Recorder 

## 2019-07-01 ENCOUNTER — Telehealth: Payer: Self-pay | Admitting: Cardiology

## 2019-07-01 NOTE — Telephone Encounter (Signed)
LVM for patient to call office to schedule his 6 month f/u

## 2019-07-23 ENCOUNTER — Telehealth: Payer: Self-pay | Admitting: *Deleted

## 2019-07-23 NOTE — Telephone Encounter (Signed)
Spoke with patient's wife regarding increased frequency of brief SVT episodes (6-22sec duration), V rates 150-167bpm. See examples below. HRs continue to be low at baseline per histograms.  Pt's wife reports that he has not had any syncopal episodes, has not reported any dizziness, palpitations, or other cardiac symptoms. She agrees to call our office for any of these symptoms going forward.   Pt's wife asked when he should f/u with Dr. Rubin Payor visit mentions f/u pending LINQ results. Will confirm.

## 2019-07-26 ENCOUNTER — Ambulatory Visit (INDEPENDENT_AMBULATORY_CARE_PROVIDER_SITE_OTHER): Payer: Medicare Other | Admitting: *Deleted

## 2019-07-26 DIAGNOSIS — I452 Bifascicular block: Secondary | ICD-10-CM | POA: Diagnosis not present

## 2019-07-26 LAB — CUP PACEART REMOTE DEVICE CHECK
Date Time Interrogation Session: 20200912214032
Implantable Pulse Generator Implant Date: 20191120

## 2019-07-26 NOTE — Telephone Encounter (Signed)
Recent report sent to the device pool.  If patient is asymptomatic with SVT episodes, would plan for continued monitoring.

## 2019-07-27 NOTE — Telephone Encounter (Signed)
Spoke with patient's wife to advise regarding PRN f/u with Dr. Curt Bears. She is agreeable to this plan, aware to call for symptomatic episodes, questions, or concerns.

## 2019-08-04 ENCOUNTER — Other Ambulatory Visit (INDEPENDENT_AMBULATORY_CARE_PROVIDER_SITE_OTHER): Payer: Medicare Other

## 2019-08-04 ENCOUNTER — Other Ambulatory Visit: Payer: Self-pay | Admitting: Family Medicine

## 2019-08-04 ENCOUNTER — Ambulatory Visit: Payer: Self-pay

## 2019-08-04 DIAGNOSIS — N183 Chronic kidney disease, stage 3 unspecified: Secondary | ICD-10-CM

## 2019-08-04 DIAGNOSIS — E538 Deficiency of other specified B group vitamins: Secondary | ICD-10-CM

## 2019-08-04 DIAGNOSIS — E559 Vitamin D deficiency, unspecified: Secondary | ICD-10-CM

## 2019-08-04 DIAGNOSIS — D696 Thrombocytopenia, unspecified: Secondary | ICD-10-CM

## 2019-08-04 DIAGNOSIS — E785 Hyperlipidemia, unspecified: Secondary | ICD-10-CM | POA: Diagnosis not present

## 2019-08-04 LAB — CBC WITH DIFFERENTIAL/PLATELET
Basophils Absolute: 0.1 10*3/uL (ref 0.0–0.1)
Basophils Relative: 1.2 % (ref 0.0–3.0)
Eosinophils Absolute: 0.1 10*3/uL (ref 0.0–0.7)
Eosinophils Relative: 1.9 % (ref 0.0–5.0)
HCT: 45.9 % (ref 39.0–52.0)
Hemoglobin: 15.8 g/dL (ref 13.0–17.0)
Lymphocytes Relative: 26.5 % (ref 12.0–46.0)
Lymphs Abs: 1.3 10*3/uL (ref 0.7–4.0)
MCHC: 34.4 g/dL (ref 30.0–36.0)
MCV: 95.2 fl (ref 78.0–100.0)
Monocytes Absolute: 0.6 10*3/uL (ref 0.1–1.0)
Monocytes Relative: 11.9 % (ref 3.0–12.0)
Neutro Abs: 3 10*3/uL (ref 1.4–7.7)
Neutrophils Relative %: 58.5 % (ref 43.0–77.0)
Platelets: 104 10*3/uL — ABNORMAL LOW (ref 150.0–400.0)
RBC: 4.82 Mil/uL (ref 4.22–5.81)
RDW: 13.6 % (ref 11.5–15.5)
WBC: 5.1 10*3/uL (ref 4.0–10.5)

## 2019-08-04 LAB — LIPID PANEL
Cholesterol: 175 mg/dL (ref 0–200)
HDL: 34.6 mg/dL — ABNORMAL LOW (ref >39.00)
LDL Cholesterol: 113 mg/dL — ABNORMAL HIGH (ref 0–99)
NonHDL: 140.3
Total CHOL/HDL Ratio: 5
Triglycerides: 138 mg/dL (ref 0.0–149.0)
VLDL: 27.6 mg/dL (ref 0.0–40.0)

## 2019-08-04 LAB — VITAMIN B12: Vitamin B-12: 956 pg/mL — ABNORMAL HIGH (ref 211–911)

## 2019-08-04 LAB — COMPREHENSIVE METABOLIC PANEL
ALT: 18 U/L (ref 0–53)
AST: 16 U/L (ref 0–37)
Albumin: 3.9 g/dL (ref 3.5–5.2)
Alkaline Phosphatase: 78 U/L (ref 39–117)
BUN: 10 mg/dL (ref 6–23)
CO2: 31 mEq/L (ref 19–32)
Calcium: 9.3 mg/dL (ref 8.4–10.5)
Chloride: 101 mEq/L (ref 96–112)
Creatinine, Ser: 1.27 mg/dL (ref 0.40–1.50)
GFR: 54.1 mL/min — ABNORMAL LOW (ref >60.00)
Glucose, Bld: 91 mg/dL (ref 70–99)
Potassium: 4.3 mEq/L (ref 3.5–5.1)
Sodium: 139 mEq/L (ref 135–145)
Total Bilirubin: 0.6 mg/dL (ref 0.2–1.2)
Total Protein: 6.1 g/dL (ref 6.0–8.3)

## 2019-08-04 LAB — VITAMIN D 25 HYDROXY (VIT D DEFICIENCY, FRACTURES): VITD: 59.72 ng/mL (ref 30.00–100.00)

## 2019-08-09 NOTE — Progress Notes (Signed)
Carelink Summary Report / Loop Recorder 

## 2019-08-11 ENCOUNTER — Other Ambulatory Visit: Payer: Self-pay

## 2019-08-11 ENCOUNTER — Encounter: Payer: Self-pay | Admitting: Family Medicine

## 2019-08-11 ENCOUNTER — Ambulatory Visit (INDEPENDENT_AMBULATORY_CARE_PROVIDER_SITE_OTHER): Payer: Medicare Other | Admitting: Family Medicine

## 2019-08-11 VITALS — BP 122/70 | HR 72 | Temp 97.7°F | Ht 71.0 in | Wt 192.2 lb

## 2019-08-11 DIAGNOSIS — E785 Hyperlipidemia, unspecified: Secondary | ICD-10-CM

## 2019-08-11 DIAGNOSIS — Z7189 Other specified counseling: Secondary | ICD-10-CM

## 2019-08-11 DIAGNOSIS — G3184 Mild cognitive impairment, so stated: Secondary | ICD-10-CM

## 2019-08-11 DIAGNOSIS — E559 Vitamin D deficiency, unspecified: Secondary | ICD-10-CM

## 2019-08-11 DIAGNOSIS — Z0000 Encounter for general adult medical examination without abnormal findings: Secondary | ICD-10-CM | POA: Diagnosis not present

## 2019-08-11 DIAGNOSIS — Z23 Encounter for immunization: Secondary | ICD-10-CM | POA: Diagnosis not present

## 2019-08-11 DIAGNOSIS — R55 Syncope and collapse: Secondary | ICD-10-CM

## 2019-08-11 DIAGNOSIS — N183 Chronic kidney disease, stage 3 unspecified: Secondary | ICD-10-CM

## 2019-08-11 DIAGNOSIS — H9193 Unspecified hearing loss, bilateral: Secondary | ICD-10-CM

## 2019-08-11 DIAGNOSIS — D696 Thrombocytopenia, unspecified: Secondary | ICD-10-CM

## 2019-08-11 DIAGNOSIS — E538 Deficiency of other specified B group vitamins: Secondary | ICD-10-CM

## 2019-08-11 HISTORY — DX: Encounter for general adult medical examination without abnormal findings: Z00.00

## 2019-08-11 MED ORDER — CYANOCOBALAMIN 500 MCG PO TABS: 500.0000 ug | ORAL_TABLET | Freq: Every day | ORAL | Status: DC

## 2019-08-11 MED ORDER — MEMANTINE HCL ER 14 MG PO CP24: 14.0000 mg | ORAL_CAPSULE | Freq: Every day | ORAL | 3 refills | Status: DC

## 2019-08-11 NOTE — Patient Instructions (Addendum)
Flu shot today Work on setting up advanced directives and bring [place] a copy when you finish it.  Work on low cholesterol diet to improve cholesterol levels.  Good to see you today, return as needed or in 6 months for follow up visit with memory testing.   Health Maintenance After Age 83 After age 59, you are at a higher risk for certain long-term diseases and infections as well as injuries from falls. Falls are a major cause of broken bones and head injuries in people who are older than age 72. Getting regular preventive care can help to keep you healthy and well. Preventive care includes getting regular testing and making lifestyle changes as recommended by your health care provider. Talk with your health care provider about:  Which screenings and tests you should have. A screening is a test that checks for a disease when you have no symptoms.  A diet and exercise plan that is right for you. What should I know about screenings and tests to prevent falls? Screening and testing are the best ways to find a health problem early. Early diagnosis and treatment give you the best chance of managing medical conditions that are common after age 84. Certain conditions and lifestyle choices may make you more likely to have a fall. Your health care provider may recommend:  Regular vision checks. Poor vision and conditions such as cataracts can make you more likely to have a fall. If you wear glasses, make sure to get your prescription updated if your vision changes.  Medicine review. Work with your health care provider to regularly review all of the medicines you are taking, including over-the-counter medicines. Ask your health care provider about any side effects that may make you more likely to have a fall. Tell your health care provider if any medicines that you take make you feel dizzy or sleepy.  Osteoporosis screening. Osteoporosis is a condition that causes the bones to get weaker. This can make the bones  weak and cause them to break more easily.  Blood pressure screening. Blood pressure changes and medicines to control blood pressure can make you feel dizzy.  Strength and balance checks. Your health care provider may recommend certain tests to check your strength and balance while standing, walking, or changing positions.  Foot health exam. Foot pain and numbness, as well as not wearing proper footwear, can make you more likely to have a fall.  Depression screening. You may be more likely to have a fall if you have a fear of falling, feel emotionally low, or feel unable to do activities that you used to do.  Alcohol use screening. Using too much alcohol can affect your balance and may make you more likely to have a fall. What actions can I take to lower my risk of falls? General instructions  Talk with your health care provider about your risks for falling. Tell your health care provider if: ? You fall. Be sure to tell your health care provider about all falls, even ones that seem minor. ? You feel dizzy, sleepy, or off-balance.  Take over-the-counter and prescription medicines only as told by your health care provider. These include any supplements.  Eat a healthy diet and maintain a healthy weight. A healthy diet includes low-fat dairy products, low-fat (lean) meats, and fiber from whole grains, beans, and lots of fruits and vegetables. Home safety  Remove any tripping hazards, such as rugs, cords, and clutter.  Install safety equipment such as grab bars in  bathrooms and safety rails on stairs.  Keep rooms and walkways well-lit. Activity   Follow a regular exercise program to stay fit. This will help you maintain your balance. Ask your health care provider what types of exercise are appropriate for you.  If you need a cane or walker, use it as recommended by your health care provider.  Wear supportive shoes that have nonskid soles. Lifestyle  Do not drink alcohol if your  health care provider tells you not to drink.  If you drink alcohol, limit how much you have: ? 0-1 drink a day for women. ? 0-2 drinks a day for men.  Be aware of how much alcohol is in your drink. In the [place] one drink equals one typical bottle of beer (12 oz), one-half glass of wine (5 oz), or one shot of hard liquor (1 oz).  Do not use any products that contain nicotine or tobacco, such as cigarettes and e-cigarettes. If you need help quitting, ask your health care provider. Summary  Having a healthy lifestyle and getting preventive care can help to protect your health and wellness after age 30.  Screening and testing are the best way to find a health problem early and help you avoid having a fall. Early diagnosis and treatment give you the best chance for managing medical conditions that are more common for people who are older than age 99.  Falls are a major cause of broken bones and head injuries in people who are older than age 57. Take precautions to prevent a fall at home.  Work with your health care provider to learn what changes you can make to improve your health and wellness and to prevent falls. This information is not intended to replace advice given to you by your health care provider. Make sure you discuss any questions you have with your health care provider. Document Released: 09/10/2017 Document Revised: 02/18/2019 Document Reviewed: 09/10/2017 Elsevier Patient Education  2020 [employer].

## 2019-08-11 NOTE — Assessment & Plan Note (Signed)
Continue vit D 1000 IU daily. 

## 2019-08-11 NOTE — Assessment & Plan Note (Signed)
Stable period without increased level of care need noted. Continue namenda XR. RTC 6 mo with MMSE.

## 2019-08-11 NOTE — Assessment & Plan Note (Signed)
Drop b12 to 541mcg daily.

## 2019-08-11 NOTE — Assessment & Plan Note (Signed)
Chronic, mild. ?ITP. Continue to monitor. If drops below 100, refer to heme. Other cell lines ok.

## 2019-08-11 NOTE — Assessment & Plan Note (Signed)
Chronic, not on statin. Will avoid in h/o memory loss. Reviewed healthy diet choices to improve LDL.  The ASCVD Risk score Dylan Bussing DC Jr., et al., 2013) failed to calculate for the following reasons:   The 2013 ASCVD risk score is only valid for ages 66 to 12

## 2019-08-11 NOTE — Assessment & Plan Note (Signed)

## 2019-08-11 NOTE — Assessment & Plan Note (Signed)
Advanced directive discussion - does not have set up. Thinks ok with feeding tube and CPR/compressions but doesn't want prolonged life support. Wife [name] would be HCPOA. Packet previously provided

## 2019-08-11 NOTE — Addendum Note (Signed)
Addended by: Brenton Grills on: [id number] PM   Modules accepted: Orders

## 2019-08-11 NOTE — Progress Notes (Signed)
This visit was conducted in person.  BP 122/70 (BP Location: Left Arm, Patient Position: Sitting, Cuff Size: Normal)   Pulse 72   Temp 97.7 F (36.5 C) (Temporal)   Ht 5\' 11"  (1.803 m)   Wt 192 lb 3 oz (87.2 kg)   SpO2 98%   BMI 26.80 kg/m    CC: AMW Subjective:    Patient ID: Dylan [name], male    DOB: Sep 08, 1934, 85 y.o.   MRN: [id number]  HPI: Dylan [name] is a 85 y.o. male presenting on 08/11/2019 for Medicare Wellness   Did not see health advisor this year.    Hearing Screening   125Hz  250Hz  500Hz  1000Hz  2000Hz  3000Hz  4000Hz  6000Hz  8000Hz   Right ear:           Left ear:           Comments: Wears bilateral hearing aids.  Wearing at today's visit  Vision Screening Comments: Last eye exam, 09/2018    Office Visit from 08/11/2019 in [place] at [facility] Total Score  0     Fall Risk  08/11/2019 08/02/2018  Falls in the past year? 1 Yes  Comment - lost balance  Number falls in past yr: 0 2 or more  Injury with Fall? 1 No  Last syncopal episode 08/2018.   Known MCI with memory loss, stable period on namenda XR 14mg  daily. Has not seen neurology. Some fluctuations in cognition. Last week - first time in 2 years - took truck out for a drive, got lost in downtown [place] but drove back home, then had episode of urinary incontinence. 2-3d/wk staying in bed all day.   Recurrent syncope - s/p Linq monitor without recurrent syncope. No noted h/o seizures.   Continues b12 tablets- has not found dissolvable tablets.   Started using cane more regularly.   Preventative: Colon cancer screening - aged out. Denies changes in BM or blood. Prostate cancer screening - aged out.  Lung cancer screening - not eligible Flu shot - declines Td 2011 Pneumovax - discussed, declines Shingrix - declines Advanced directive discussion - does not have set up. Thinks ok with feeding tube and CPR/compressions but doesn't want prolonged life support. Wife Dylan [name] would  be HCPOA. Packet previously provided Seat belt use discussed  Sunscreen use discussed. No changing moles on skin. Sees dermatologist.  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 Interim medical history since our last visit reviewed. Allergies and medications reviewed and updated. Outpatient Medications Prior to Visit  Medication Sig Dispense Refill  . Cholecalciferol (VITAMIN D3) 1000 units CAPS Take 1 capsule (1,000 Units total) by mouth daily. 30 capsule   . Cyanocobalamin (B-12) 1000 MCG SUBL Place 1 tablet under the tongue daily. 30 each   . memantine (NAMENDA XR) 14 MG CP24 24 hr capsule Take 1 capsule (14 mg total) by mouth daily. 30 capsule 6   No facility-administered medications prior to visit.      Per HPI unless specifically indicated in ROS section below Review of Systems  Constitutional: Negative for activity change, appetite change, chills, fatigue, fever and unexpected  weight change.  HENT: Negative for hearing loss.   Eyes: Negative for visual disturbance.  Respiratory: Positive for cough (noted after eating). Negative for chest tightness, shortness of breath and wheezing.   Cardiovascular: Negative for chest pain, palpitations and leg swelling.  Gastrointestinal: Negative for abdominal distention, abdominal pain, blood in stool, constipation, diarrhea, nausea and vomiting.  Genitourinary: Negative for difficulty urinating and hematuria.  Musculoskeletal: Negative for arthralgias, myalgias and neck pain.  Skin: Negative for rash.  Neurological: Negative for dizziness, seizures, syncope and headaches.   Hematological: Negative for adenopathy. Does not bruise/bleed easily.  Psychiatric/Behavioral: Positive for dysphoric mood (declines medication). The patient is not nervous/anxious.    Objective:    BP 122/70 (BP Location: Left Arm, Patient Position: Sitting, Cuff Size: Normal)   Pulse 72   Temp 97.7 F (36.5 C) (Temporal)   Ht 5\' 11"  (1.803 m)   Wt 192 lb 3 oz (87.2 kg)   SpO2 98%   BMI 26.80 kg/m   Wt Readings from Last 3 Encounters:  08/11/19 192 lb 3 oz (87.2 kg)  05/11/19 192 lb 7 oz (87.3 kg)  01/13/19 195 lb 7 oz (88.6 kg)    Physical Exam Vitals signs and nursing note reviewed.  Constitutional:      General: He is not in acute distress.    Appearance: Normal appearance. He is well-developed. He is not ill-appearing.  HENT:     Head: Normocephalic and atraumatic.     Right Ear: Hearing, tympanic membrane, ear canal and external ear normal.     Left Ear: Hearing, tympanic membrane, ear canal and external ear normal.     Nose: Nose normal.     Mouth/Throat:     Mouth: Mucous membranes are moist.     Pharynx: Oropharynx is clear. Uvula midline. No oropharyngeal exudate or posterior oropharyngeal erythema.  Eyes:     General: No scleral icterus.    Extraocular Movements: Extraocular movements intact.     Conjunctiva/sclera: Conjunctivae normal.     Pupils: Pupils are equal, round, and reactive to light.  Neck:     Musculoskeletal: Normal range of motion and neck supple.     Vascular: No carotid bruit.  Cardiovascular:     Rate and Rhythm: Normal rate and regular rhythm.     Pulses: Normal pulses.          Radial pulses are 2+ on the right side and 2+ on the left side.     Heart sounds: Normal heart sounds. No murmur.  Pulmonary:     Effort: Pulmonary effort is normal. No respiratory distress.     Breath sounds: Normal breath sounds. No wheezing, rhonchi or rales.  Abdominal:     General: Abdomen is flat. Bowel sounds are normal. There is no distension.      Palpations: Abdomen is soft. There is no mass.     Tenderness: There is no abdominal tenderness. There is no guarding or rebound.     Hernia: No hernia is present.  Musculoskeletal: Normal range of motion.     Right lower leg: No edema.     Left lower leg: No edema.  Lymphadenopathy:     Cervical: No cervical adenopathy.  Skin:    General: Skin is warm and dry.     Findings: No rash.  Neurological:     General: No focal deficit present.     Mental Status: He is alert and oriented to person, place, and time.     Comments:  CN grossly intact, station and gait intact Recall 2/3, 2/3 with cue Calculation 5/5 D-L-[id number]  Psychiatric:        Mood and Affect: Mood normal.        Behavior: Behavior normal.        Thought Content: Thought content normal.        Judgment: Judgment normal.       Results for orders placed or performed in visit on 08/04/19  VITAMIN D 25 Hydroxy (Vit-D Deficiency, Fractures)  Result Value Ref Range   VITD 59.72 30.00 - 100.00 ng/mL  Vitamin B12  Result Value Ref Range   Vitamin B-12 956 (H) 211 - 911 pg/mL  CBC with Differential/Platelet  Result Value Ref Range   WBC 5.1 4.0 - 10.5 K/uL   RBC 4.82 4.22 - 5.81 Mil/uL   Hemoglobin 15.8 13.0 - 17.0 g/dL   HCT 45.9 39.0 - 52.0 %   MCV 95.2 78.0 - 100.0 fl   MCHC 34.4 30.0 - 36.0 g/dL   RDW 13.6 11.5 - 15.5 %   Platelets 104.0 (L) 150.0 - 400.0 K/uL   Neutrophils Relative % 58.5 43.0 - 77.0 %   Lymphocytes Relative 26.5 12.0 - 46.0 %   Monocytes Relative 11.9 3.0 - 12.0 %   Eosinophils Relative 1.9 0.0 - 5.0 %   Basophils Relative 1.2 0.0 - 3.0 %   Neutro Abs 3.0 1.4 - 7.7 K/uL   Lymphs Abs 1.3 0.7 - 4.0 K/uL   Monocytes Absolute 0.6 0.1 - 1.0 K/uL   Eosinophils Absolute 0.1 0.0 - 0.7 K/uL   Basophils Absolute 0.1 0.0 - 0.1 K/uL  Lipid panel  Result Value Ref Range   Cholesterol 175 0 - 200 mg/dL   Triglycerides 138.0 0.0 - 149.0 mg/dL   HDL 34.60 (L) >39.00 mg/dL   VLDL 27.6 0.0 - 40.0 mg/dL    LDL Cholesterol 113 (H) 0 - 99 mg/dL   Total CHOL/HDL Ratio 5    NonHDL 140.30   Comprehensive metabolic panel  Result Value Ref Range   Sodium 139 135 - 145 mEq/L   Potassium 4.3 3.5 - 5.1 mEq/L   Chloride 101 96 - 112 mEq/L   CO2 31 19 - 32 mEq/L   Glucose, Bld 91 70 - 99 mg/dL   BUN 10 6 - 23 mg/dL   Creatinine, Ser 1.27 0.40 - 1.50 mg/dL   Total Bilirubin 0.6 0.2 - 1.2 mg/dL   Alkaline Phosphatase 78 39 - 117 U/L   AST 16 0 - 37 U/L   ALT 18 0 - 53 U/L   Total Protein 6.1 6.0 - 8.3 g/dL   Albumin 3.9 3.5 - 5.2 g/dL   Calcium 9.3 8.4 - 10.5 mg/dL   GFR 54.10 (L) >60.00 mL/min   Assessment & Plan:   Problem List Items Addressed This Visit    Vitamin D deficiency    Continue vit D 1000 IU daily.       Vitamin B12 deficiency    Drop b12 to 545mcg daily.       Thrombocytopenia (HCC)    Chronic, mild. ?ITP. Continue to monitor. If drops below 100, refer to heme. Other cell lines ok.       Recurrent syncope    No recurrence since 08/2018.       Mild cognitive impairment with memory loss    Stable period without increased level of care need noted. Continue namenda XR. RTC 6 mo with MMSE.  Medicare annual wellness visit, subsequent - Primary    I have personally reviewed the Medicare Annual Wellness questionnaire and have noted 1. The patient's medical and social history 2. Their use of alcohol, tobacco or illicit drugs 3. Their current medications and supplements 4. The patient's functional ability including ADL's, fall risks, home safety risks and hearing or visual impairment. Cognitive function has been assessed and addressed as indicated.  5. Diet and physical activity 6. Evidence for depression or mood disorders The patients weight, height, BMI have been recorded in the chart. I have made referrals, counseling and provided education to the patient based on review of the above and I have provided the pt with a written personalized care plan for preventive  services. Provider list updated.. See scanned questionairre as needed for further documentation. Reviewed preventative protocols and updated unless pt declined.       Health maintenance examination    Preventative protocols reviewed and updated unless pt declined. Discussed healthy diet and lifestyle.       Dyslipidemia    Chronic, not on statin. Will avoid in h/o memory loss. Reviewed healthy diet choices to improve LDL.  The ASCVD Risk score Mikey Bussing DC Jr., et al., 2013) failed to calculate for the following reasons:   The 2013 ASCVD risk score is only valid for ages 65 to 37       CKD (chronic kidney disease) stage 3, GFR 30-59 ml/min (HCC)    Again reviewed with patient. Continue to monitor.       Bilateral hearing loss    Wears hearing aides      Advanced care planning/counseling discussion    Advanced directive discussion - does not have set up. Thinks ok with feeding tube and CPR/compressions but doesn't want prolonged life support. Wife Dylan [name] would be HCPOA. Packet previously provided          Meds ordered this encounter  Medications  . memantine (NAMENDA XR) 14 MG CP24 24 hr capsule    Sig: Take 1 capsule (14 mg total) by mouth daily.    Dispense:  90 capsule    Refill:  3  . vitamin B-12 (V-R VITAMIN B-12) 500 MCG tablet    Sig: Take 1 tablet (500 mcg total) by mouth daily.    Dispense:      No orders of the defined types were placed in this encounter.   Follow up plan: Return in about 6 months (around 02/08/2020) for follow up visit.  [name], MD

## 2019-08-11 NOTE — Assessment & Plan Note (Signed)
Again reviewed with patient. Continue to monitor.

## 2019-08-11 NOTE — Assessment & Plan Note (Signed)
No recurrence since 08/2018.

## 2019-08-11 NOTE — Assessment & Plan Note (Signed)
Preventative protocols reviewed and updated unless pt declined. Discussed healthy diet and lifestyle.  

## 2019-08-11 NOTE — Assessment & Plan Note (Signed)
Wears hearing aides 

## 2019-08-26 ENCOUNTER — Ambulatory Visit (INDEPENDENT_AMBULATORY_CARE_PROVIDER_SITE_OTHER): Payer: Medicare Other | Admitting: *Deleted

## 2019-08-26 DIAGNOSIS — R55 Syncope and collapse: Secondary | ICD-10-CM | POA: Diagnosis not present

## 2019-08-29 LAB — CUP PACEART REMOTE DEVICE CHECK
Date Time Interrogation Session: 20201015213850
Date Time Interrogation Session: 20201018040500
Implantable Pulse Generator Implant Date: 20191120
Implantable Pulse Generator Implant Date: 20191120

## 2019-09-01 DIAGNOSIS — L821 Other seborrheic keratosis: Secondary | ICD-10-CM | POA: Diagnosis not present

## 2019-09-01 DIAGNOSIS — L820 Inflamed seborrheic keratosis: Secondary | ICD-10-CM | POA: Diagnosis not present

## 2019-09-01 DIAGNOSIS — Z1283 Encounter for screening for malignant neoplasm of skin: Secondary | ICD-10-CM | POA: Diagnosis not present

## 2019-09-01 DIAGNOSIS — L570 Actinic keratosis: Secondary | ICD-10-CM | POA: Diagnosis not present

## 2019-09-01 DIAGNOSIS — L814 Other melanin hyperpigmentation: Secondary | ICD-10-CM | POA: Diagnosis not present

## 2019-09-09 NOTE — Progress Notes (Signed)
Carelink Summary Report / Loop Recorder 

## 2019-09-20 DIAGNOSIS — H5212 Myopia, left eye: Secondary | ICD-10-CM | POA: Diagnosis not present

## 2019-09-20 DIAGNOSIS — H04123 Dry eye syndrome of bilateral lacrimal glands: Secondary | ICD-10-CM | POA: Diagnosis not present

## 2019-09-20 DIAGNOSIS — H0288A Meibomian gland dysfunction right eye, upper and lower eyelids: Secondary | ICD-10-CM | POA: Diagnosis not present

## 2019-09-20 DIAGNOSIS — H0288B Meibomian gland dysfunction left eye, upper and lower eyelids: Secondary | ICD-10-CM | POA: Diagnosis not present

## 2019-09-20 DIAGNOSIS — Z9842 Cataract extraction status, left eye: Secondary | ICD-10-CM | POA: Diagnosis not present

## 2019-09-20 DIAGNOSIS — Z9841 Cataract extraction status, right eye: Secondary | ICD-10-CM | POA: Diagnosis not present

## 2019-09-28 ENCOUNTER — Ambulatory Visit (INDEPENDENT_AMBULATORY_CARE_PROVIDER_SITE_OTHER): Payer: Medicare Other | Admitting: *Deleted

## 2019-09-28 DIAGNOSIS — I452 Bifascicular block: Secondary | ICD-10-CM | POA: Diagnosis not present

## 2019-09-29 LAB — CUP PACEART REMOTE DEVICE CHECK
Date Time Interrogation Session: 20201117213918
Implantable Pulse Generator Implant Date: 20191120

## 2019-10-26 NOTE — Progress Notes (Signed)
Carelink Summary Report / Loop Recorder 

## 2019-11-01 ENCOUNTER — Ambulatory Visit (INDEPENDENT_AMBULATORY_CARE_PROVIDER_SITE_OTHER): Payer: Medicare Other | Admitting: *Deleted

## 2019-11-01 DIAGNOSIS — R55 Syncope and collapse: Secondary | ICD-10-CM

## 2019-11-01 LAB — CUP PACEART REMOTE DEVICE CHECK
Date Time Interrogation Session: 20201220164332
Implantable Pulse Generator Implant Date: 20191120

## 2019-12-06 ENCOUNTER — Ambulatory Visit (INDEPENDENT_AMBULATORY_CARE_PROVIDER_SITE_OTHER): Payer: Medicare Other | Admitting: *Deleted

## 2019-12-06 DIAGNOSIS — R55 Syncope and collapse: Secondary | ICD-10-CM | POA: Diagnosis not present

## 2019-12-06 LAB — CUP PACEART REMOTE DEVICE CHECK
Date Time Interrogation Session: 20210124232837
Implantable Pulse Generator Implant Date: 20191120

## 2019-12-22 ENCOUNTER — Encounter: Payer: Self-pay | Admitting: Family Medicine

## 2020-01-10 ENCOUNTER — Ambulatory Visit (INDEPENDENT_AMBULATORY_CARE_PROVIDER_SITE_OTHER): Payer: Medicare Other | Admitting: *Deleted

## 2020-01-10 DIAGNOSIS — R55 Syncope and collapse: Secondary | ICD-10-CM

## 2020-01-10 LAB — CUP PACEART REMOTE DEVICE CHECK
Date Time Interrogation Session: 20210228235722
Implantable Pulse Generator Implant Date: 20191120

## 2020-01-10 NOTE — Progress Notes (Signed)
ILR Remote 

## 2020-02-09 ENCOUNTER — Ambulatory Visit: Payer: Medicare Other | Admitting: Family Medicine

## 2020-02-14 ENCOUNTER — Ambulatory Visit (INDEPENDENT_AMBULATORY_CARE_PROVIDER_SITE_OTHER): Payer: Medicare Other | Admitting: *Deleted

## 2020-02-14 DIAGNOSIS — R55 Syncope and collapse: Secondary | ICD-10-CM | POA: Diagnosis not present

## 2020-02-15 LAB — CUP PACEART REMOTE DEVICE CHECK
Date Time Interrogation Session: 20210401010026
Implantable Pulse Generator Implant Date: 20191120

## 2020-02-16 ENCOUNTER — Telehealth: Payer: Self-pay

## 2020-02-16 ENCOUNTER — Ambulatory Visit (INDEPENDENT_AMBULATORY_CARE_PROVIDER_SITE_OTHER): Payer: Medicare Other | Admitting: Family Medicine

## 2020-02-16 ENCOUNTER — Encounter: Payer: Self-pay | Admitting: Family Medicine

## 2020-02-16 ENCOUNTER — Other Ambulatory Visit: Payer: Self-pay

## 2020-02-16 ENCOUNTER — Ambulatory Visit
Admission: RE | Admit: 2020-02-16 | Discharge: 2020-02-16 | Disposition: A | Discharge status: Home / Self Care | Payer: Medicare Other | Source: Ambulatory Visit | Attending: Family Medicine | Admitting: Family Medicine

## 2020-02-16 VITALS — BP 118/62 | HR 71 | Temp 97.5°F | Ht 71.0 in | Wt 198.2 lb

## 2020-02-16 DIAGNOSIS — N1831 Chronic kidney disease, stage 3a: Secondary | ICD-10-CM

## 2020-02-16 DIAGNOSIS — E538 Deficiency of other specified B group vitamins: Secondary | ICD-10-CM | POA: Diagnosis not present

## 2020-02-16 DIAGNOSIS — D696 Thrombocytopenia, unspecified: Secondary | ICD-10-CM

## 2020-02-16 DIAGNOSIS — G3184 Mild cognitive impairment, so stated: Secondary | ICD-10-CM

## 2020-02-16 DIAGNOSIS — M7989 Other specified soft tissue disorders: Secondary | ICD-10-CM | POA: Insufficient documentation

## 2020-02-16 DIAGNOSIS — R1310 Dysphagia, unspecified: Secondary | ICD-10-CM

## 2020-02-16 DIAGNOSIS — L299 Pruritus, unspecified: Secondary | ICD-10-CM

## 2020-02-16 MED ORDER — DOXEPIN HCL 25 MG PO CAPS: 25.0000 mg | ORAL_CAPSULE | Freq: Every day | ORAL | 1 refills | Status: DC

## 2020-02-16 NOTE — Telephone Encounter (Signed)
Noted.  See result note.  

## 2020-02-16 NOTE — Patient Instructions (Addendum)
Memory testing was somewhat worse today.  Continue namenda XR 14mg  daily Ok to try doxepin 25mg  at night time for itching/sleep.  We will refer you for leg ultrasound to rule out blood clot as cause of right leg swelling.

## 2020-02-16 NOTE — Progress Notes (Signed)
This visit was conducted in person.  BP 118/62 (BP Location: Left Arm, Patient Position: Sitting, Cuff Size: Normal)   Pulse 71   Temp (!) 97.5 F (36.4 C) (Temporal)   Ht 5\' 11"  (1.803 m)   Wt 198 lb 3 oz (89.9 kg)   SpO2 97%   BMI 27.64 kg/m    CC: 6 mo f/u visit  Subjective:    Patient ID: Dylan [name], male    DOB: [birth year], 86 y.o.   MRN: [id number]  HPI: Dylan [name] is a 86 y.o. male presenting on 02/16/2020 for Follow-up (Here for 6 mo f/u.  Pt accompanied by wife, [name]- temp 97.6.)   Known MCI with memory loss, progressive, on namenda XR 14mg  daily, has not seen neurology. Due for MMSE today.   Wife notices occasional R>L hand shaking when sitting.  No visual/auditory hallucinations.  Some night terrors. Weighted blankets help.  No significant behavioral difficulties.   Ongoing trouble itching due to dry skin. Has started using dove body wash, using cortizone-10 OTC.    Some solid and liquid dysphagia. Weight gain noted.  Feet stay red and swollen, toes can look blue.  Geriatric Assessment: Activities of Daily Living:     Bathing- independent     Dressing- independent     Eating- independent    Toileting- independent    Transferring- independent     Continence- independent  Overall Assessment: independent  Instrumental Activities of Daily Living:     Transportation- dependent    Meal/Food Preparation- partially dependent    Shopping Errands- dependent    Housekeeping/Chores- dependent    Money Management/Finances- dependent     Medication Management- dependent    Ability to Use Telephone- dependent    Laundry- dependent  Overall Assessment:  dependent  Mental Status Exam: 21/30     Clock Drawing Score: 4/4     Relevant past medical, surgical, family and social history reviewed and updated as indicated. Interim medical history since our last visit reviewed. Allergies and medications reviewed and updated. Outpatient Medications Prior to Visit   Medication Sig Dispense Refill  . Cholecalciferol (VITAMIN D3) 1000 units CAPS Take 1 capsule (1,000 Units total) by mouth daily. 30 capsule   . memantine (NAMENDA XR) 14 MG CP24 24 hr capsule Take 1 capsule (14 mg total) by mouth daily. 90 capsule 3  . vitamin B-12 (V-R VITAMIN B-12) 500 MCG tablet Take 1 tablet (500 mcg total) by mouth daily.     No facility-administered medications prior to visit.     Per HPI unless specifically indicated in ROS section below Review of Systems Objective:    BP 118/62 (BP Location: Left Arm, Patient Position: Sitting, Cuff Size: Normal)   Pulse 71   Temp (!) 97.5 F (36.4 C) (Temporal)   Ht 5\' 11"  (1.803 m)   Wt 198 lb 3 oz (89.9 kg)   SpO2 97%   BMI 27.64 kg/m   Wt Readings from Last 3 Encounters:  02/16/20 198 lb 3 oz (89.9 kg)  08/11/19 192 lb 3 oz (87.2 kg)  05/11/19 192 lb 7 oz (87.3 kg)    Physical Exam Vitals and nursing note reviewed.  Constitutional:      Appearance: Normal appearance. He is not ill-appearing.  Cardiovascular:     Rate and Rhythm: Normal rate and regular rhythm.     Pulses: Normal pulses.     Heart sounds: Normal heart sounds. No murmur.  Pulmonary:     Effort:  Pulmonary effort is normal. No respiratory distress.     Breath sounds: Normal breath sounds. No wheezing, rhonchi or rales.  Musculoskeletal:     Right lower leg: Edema (1+) present.     Left lower leg: Edema (tr) present.     Comments:  1+ DP on left Diminished DP on right L calf circ 39.5cm R calf circ 41.5cm   Skin:    General: Skin is warm and dry.     Findings: No rash.  Neurological:     Mental Status: He is alert.     Comments:  Mild tremor noted No cogwheel rigidity, no masked facies, no shuffling gait.  Psychiatric:        Mood and Affect: Mood normal.        Behavior: Behavior normal.       No results found for: RPR  Lab Results  Component Value Date   VITAMINB12 956 (H) 08/04/2019   Lab Results  Component Value Date    CREATININE 1.27 08/04/2019   BUN 10 08/04/2019   NA 139 08/04/2019   K 4.3 08/04/2019   CL 101 08/04/2019   CO2 31 08/04/2019    Lab Results  Component Value Date   WBC 5.1 08/04/2019   HGB 15.8 08/04/2019   HCT 45.9 08/04/2019   MCV 95.2 08/04/2019   PLT 104.0 (L) 08/04/2019    Assessment & Plan:  This visit occurred during the SARS-CoV-2 public health emergency.  Safety protocols were in place, including screening questions prior to the visit, additional usage of staff PPE, and extensive cleaning of exam room while observing appropriate contact time as indicated for disinfecting solutions.   Problem List Items Addressed This Visit    Vitamin B12 deficiency    Continue 573mcg vit B12 orally. Check levels when he returns.       Relevant Orders   Vitamin B12   Thrombocytopenia (HCC)    Update levels with periph smear.       Relevant Orders   Pathologist smear review   Vitamin B12   Folate   CBC with Differential/Platelet   Right leg swelling    Newly noted, wife states present for months. Not really affecting ambulation. Given notable difference, check venus [place] r/o DVT.       Relevant Orders   US Venous Img Lower Unilateral Right (Completed)   Pruritus    Increased pruritis that has led to increased hot water use - several showers a day. No significant rash despite intense itching. Check LFTs. Trial doxepin nightly for sleep/restlessness/itching.       Relevant Orders   Comprehensive metabolic panel   Mild cognitive impairment with memory loss - Primary    Progressive memory impairment noted. MMSE 24 -->21 over the past 18 months.  He does not have typical symptoms of any of the dementias - no visual hallucinations, behavioral changes, fluctuating cognition (LBD). No rigidity/bradykinesia, noted postural instability (PD) - he's actually pretty agile. ?Alzheimer.  Night terrors - ?REM sleep behavior disorder. Will further evaluate at next visit. Encouraged increased  availability for supervision at home given progression noted.  Will ask to return for labs. Consider head imaging.  Continue Namenda XR 14mg  daily.      Relevant Orders   TSH   RPR   Dysphagia    Newly noted. Consider GI eval.       CKD (chronic kidney disease) stage 3, GFR 30-59 ml/min   Relevant Orders   Comprehensive metabolic panel  Meds ordered this encounter  Medications  . doxepin (SINEQUAN) 25 MG capsule    Sig: Take 1 capsule (25 mg total) by mouth at bedtime. For itching    Dispense:  30 capsule    Refill:  1   Orders Placed This Encounter  Procedures  . US Venous Img Lower Unilateral Right    Standing Status:   Future    Number of Occurrences:   1    Standing Expiration Date:   04/17/2021    Order Specific Question:   Reason for Exam (SYMPTOM  OR DIAGNOSIS REQUIRED)    Answer:   R >L leg swelling    Order Specific Question:   Preferred imaging location?    Answer:   [facility]    Order Specific Question:   Call Results- Best Contact Number?    Answer:   [phone]- pt can leave  . Pathologist smear review    Standing Status:   Future    Standing Expiration Date:   02/18/2021  . Vitamin B12    Standing Status:   Future    Standing Expiration Date:   02/18/2021  . Folate    Standing Status:   Future    Standing Expiration Date:   02/18/2021  . CBC with Differential/Platelet    Standing Status:   Future    Standing Expiration Date:   02/18/2021  . TSH    Standing Status:   Future    Standing Expiration Date:   02/18/2021  . RPR    Standing Status:   Future    Standing Expiration Date:   02/18/2021  . Comprehensive metabolic panel    Standing Status:   Future    Standing Expiration Date:   02/18/2021    Patient Instructions  Memory testing was somewhat worse today.  Continue namenda XR 14mg  daily Ok to try doxepin 25mg  at night time for itching/sleep.  We will refer you for leg ultrasound to rule out blood clot as cause of right leg  swelling.    Follow up plan: Return if symptoms worsen or fail to improve.  [name], MD

## 2020-02-16 NOTE — Telephone Encounter (Signed)
[name] with [facility] [place] called report for rt leg to rule out DVT; Verdis Frederickson said neg for DVT and pt has gone home. Report is in Epic and note as reminder taken to Dr Synthia Innocent area.

## 2020-02-19 DIAGNOSIS — R1310 Dysphagia, unspecified: Secondary | ICD-10-CM

## 2020-02-19 DIAGNOSIS — L299 Pruritus, unspecified: Secondary | ICD-10-CM | POA: Insufficient documentation

## 2020-02-19 DIAGNOSIS — M7989 Other specified soft tissue disorders: Secondary | ICD-10-CM

## 2020-02-19 HISTORY — DX: Dysphagia, unspecified: R13.10

## 2020-02-19 HISTORY — DX: Other specified soft tissue disorders: M79.89

## 2020-02-19 HISTORY — DX: Pruritus, unspecified: L29.9

## 2020-02-19 NOTE — Telephone Encounter (Signed)
I don't see pt/wife have reviewed recent leg [place]. Plz call wife - leg ultrasound returned reassuringly normal.  I would like them to come in for labs for further evaluation of memory and to recheck platelets (we were watching due to low readings recently). May schedule non fasting labs at their convenience.

## 2020-02-19 NOTE — Assessment & Plan Note (Signed)
Continue 539mcg vit B12 orally. Check levels when he returns.

## 2020-02-19 NOTE — Assessment & Plan Note (Signed)
Newly noted. Consider GI eval.

## 2020-02-19 NOTE — Assessment & Plan Note (Signed)
Newly noted, wife states present for months. Not really affecting ambulation. Given notable difference, check venus [place] r/o DVT.

## 2020-02-19 NOTE — Assessment & Plan Note (Signed)
Update levels with periph smear.

## 2020-02-19 NOTE — Assessment & Plan Note (Signed)
Increased pruritis that has led to increased hot water use - several showers a day. No significant rash despite intense itching. Check LFTs. Trial doxepin nightly for sleep/restlessness/itching.

## 2020-02-19 NOTE — Assessment & Plan Note (Addendum)
Progressive memory impairment noted. MMSE 24 -->21 over the past 18 months.  He does not have typical symptoms of any of the dementias - no visual hallucinations, behavioral changes, fluctuating cognition (LBD). No rigidity/bradykinesia, noted postural instability (PD) - he's actually pretty agile. ?Alzheimer.  Night terrors - ?REM sleep behavior disorder. Will further evaluate at next visit. Encouraged increased availability for supervision at home given progression noted.  Will ask to return for labs. Consider head imaging.  Continue Namenda XR 14mg  daily.

## 2020-02-21 NOTE — Telephone Encounter (Signed)
LMOVM stating U/S WNL and to plz call the office to schedule a lab visit and/or discuss this/thx dmf

## 2020-02-22 ENCOUNTER — Other Ambulatory Visit (INDEPENDENT_AMBULATORY_CARE_PROVIDER_SITE_OTHER): Payer: Medicare Other

## 2020-02-22 ENCOUNTER — Other Ambulatory Visit: Payer: Self-pay

## 2020-02-22 DIAGNOSIS — N1831 Chronic kidney disease, stage 3a: Secondary | ICD-10-CM

## 2020-02-22 DIAGNOSIS — E538 Deficiency of other specified B group vitamins: Secondary | ICD-10-CM

## 2020-02-22 DIAGNOSIS — L299 Pruritus, unspecified: Secondary | ICD-10-CM | POA: Diagnosis not present

## 2020-02-22 DIAGNOSIS — G3184 Mild cognitive impairment, so stated: Secondary | ICD-10-CM

## 2020-02-22 DIAGNOSIS — D696 Thrombocytopenia, unspecified: Secondary | ICD-10-CM

## 2020-02-22 LAB — COMPREHENSIVE METABOLIC PANEL
ALT: 20 U/L (ref 0–53)
AST: 17 U/L (ref 0–37)
Albumin: 4 g/dL (ref 3.5–5.2)
Alkaline Phosphatase: 86 U/L (ref 39–117)
BUN: 14 mg/dL (ref 6–23)
CO2: 33 mEq/L — ABNORMAL HIGH (ref 19–32)
Calcium: 9.5 mg/dL (ref 8.4–10.5)
Chloride: 103 mEq/L (ref 96–112)
Creatinine, Ser: 1.18 mg/dL (ref 0.40–1.50)
GFR: 58.81 mL/min — ABNORMAL LOW (ref >60.00)
Glucose, Bld: 74 mg/dL (ref 70–99)
Potassium: 4.8 mEq/L (ref 3.5–5.1)
Sodium: 140 mEq/L (ref 135–145)
Total Bilirubin: 0.5 mg/dL (ref 0.2–1.2)
Total Protein: 6.1 g/dL (ref 6.0–8.3)

## 2020-02-22 LAB — CBC WITH DIFFERENTIAL/PLATELET
Basophils Absolute: 0.1 10*3/uL (ref 0.0–0.1)
Basophils Relative: 1.3 % (ref 0.0–3.0)
Eosinophils Absolute: 0.1 10*3/uL (ref 0.0–0.7)
Eosinophils Relative: 1.1 % (ref 0.0–5.0)
HCT: 44.5 % (ref 39.0–52.0)
Hemoglobin: 15.1 g/dL (ref 13.0–17.0)
Lymphocytes Relative: 21 % (ref 12.0–46.0)
Lymphs Abs: 1.2 10*3/uL (ref 0.7–4.0)
MCHC: 33.9 g/dL (ref 30.0–36.0)
MCV: 96.7 fl (ref 78.0–100.0)
Monocytes Absolute: 0.8 10*3/uL (ref 0.1–1.0)
Monocytes Relative: 13.7 % — ABNORMAL HIGH (ref 3.0–12.0)
Neutro Abs: 3.5 10*3/uL (ref 1.4–7.7)
Neutrophils Relative %: 62.9 % (ref 43.0–77.0)
Platelets: 105 10*3/uL — ABNORMAL LOW (ref 150.0–400.0)
RBC: 4.6 Mil/uL (ref 4.22–5.81)
RDW: 13.4 % (ref 11.5–15.5)
WBC: 5.5 10*3/uL (ref 4.0–10.5)

## 2020-02-22 LAB — FOLATE: Folate: 10.9 ng/mL (ref >5.9)

## 2020-02-22 LAB — VITAMIN B12: Vitamin B-12: 558 pg/mL (ref 211–911)

## 2020-02-22 LAB — TSH: TSH: 1.56 u[IU]/mL (ref 0.35–4.50)

## 2020-02-23 LAB — PATHOLOGIST SMEAR REVIEW

## 2020-02-23 LAB — RPR: RPR Ser Ql: NONREACTIVE

## 2020-02-29 ENCOUNTER — Encounter: Payer: Self-pay | Admitting: Family Medicine

## 2020-02-29 DIAGNOSIS — R0989 Other specified symptoms and signs involving the circulatory and respiratory systems: Secondary | ICD-10-CM

## 2020-03-15 ENCOUNTER — Ambulatory Visit (HOSPITAL_COMMUNITY)
Admission: RE | Admit: 2020-03-15 | Discharge: 2020-03-15 | Disposition: A | Discharge status: Home / Self Care | Payer: Medicare Other | Source: Ambulatory Visit | Attending: Cardiovascular Disease | Admitting: Cardiovascular Disease

## 2020-03-15 ENCOUNTER — Other Ambulatory Visit (HOSPITAL_COMMUNITY): Payer: Self-pay | Admitting: Family Medicine

## 2020-03-15 ENCOUNTER — Other Ambulatory Visit: Payer: Self-pay

## 2020-03-15 DIAGNOSIS — R0989 Other specified symptoms and signs involving the circulatory and respiratory systems: Secondary | ICD-10-CM

## 2020-03-15 DIAGNOSIS — I739 Peripheral vascular disease, unspecified: Secondary | ICD-10-CM

## 2020-03-16 LAB — CUP PACEART REMOTE DEVICE CHECK
Date Time Interrogation Session: 20210502011008
Implantable Pulse Generator Implant Date: 20191120

## 2020-03-20 ENCOUNTER — Ambulatory Visit (INDEPENDENT_AMBULATORY_CARE_PROVIDER_SITE_OTHER): Payer: Medicare Other | Admitting: *Deleted

## 2020-03-20 DIAGNOSIS — R55 Syncope and collapse: Secondary | ICD-10-CM | POA: Diagnosis not present

## 2020-03-20 NOTE — Progress Notes (Signed)
Carelink Summary Report / Loop Recorder 

## 2020-03-21 ENCOUNTER — Encounter: Payer: Self-pay | Admitting: Family Medicine

## 2020-03-21 DIAGNOSIS — I739 Peripheral vascular disease, unspecified: Secondary | ICD-10-CM

## 2020-03-21 HISTORY — DX: Peripheral vascular disease, unspecified: I73.9

## 2020-03-24 ENCOUNTER — Telehealth: Payer: Self-pay | Admitting: Cardiovascular Disease

## 2020-03-24 NOTE — Telephone Encounter (Signed)
Patient's wife [name] calling requesting she come with the patient, because she states he has dementia.

## 2020-03-24 NOTE — Telephone Encounter (Signed)
I spoke with patient's wife and told her it would be OK for her to accompany patient to upcoming appointment with Dr [name]

## 2020-03-28 ENCOUNTER — Other Ambulatory Visit: Payer: Self-pay

## 2020-03-28 ENCOUNTER — Encounter: Payer: Self-pay | Admitting: Cardiovascular Disease

## 2020-03-28 ENCOUNTER — Ambulatory Visit: Payer: Medicare Other | Admitting: Cardiovascular Disease

## 2020-03-28 VITALS — BP 126/72 | HR 54 | Temp 97.2°F | Ht 73.0 in | Wt 197.2 lb

## 2020-03-28 DIAGNOSIS — I452 Bifascicular block: Secondary | ICD-10-CM

## 2020-03-28 DIAGNOSIS — I739 Peripheral vascular disease, unspecified: Secondary | ICD-10-CM

## 2020-03-28 NOTE — Progress Notes (Signed)
03/28/2020 Dylan [name]   11/16/1933  [id number]  Primary Physician Dylan Bush, [name] Primary Cardiologist: Dylan [name] Lupe Carney, Dylan [name]  HPI:  Dylan [name] is a 86 y.o. married Caucasian male father of 3 children, grandfather of 3 grandchildren is accompanied by his wife Dylan [name] today.  He is retired from working in Youth worker for 65 years.  He was referred by Dr. Danise [name] for peripheral vascular evaluation and treatment.  He has no cardiac risk factors.  Is never had a heart attack or stroke.  He denies chest pain or shortness of breath.  Does have some mild dyspnea.  He has had syncope in the past but not recently and has had a loop recorder implanted followed by Dr. Curt [name].  He is minimally ambulatory.  There is no evidence of critical ischemia.  He denies claudication.  He had Dopplers performed in our office 03/15/2020 revealed a right ankle-brachial index of 0.79 with what appears to be high-grade disease in the popliteal artery and a left of 1.22 which was essentially normal.   Current Meds  Medication Sig  . Cholecalciferol (VITAMIN D3) 1000 units CAPS Take 1 capsule (1,000 Units total) by mouth daily.  . memantine (NAMENDA XR) 14 MG CP24 24 hr capsule Take 1 capsule (14 mg total) by mouth daily.  . vitamin B-12 (V-R VITAMIN B-12) 500 MCG tablet Take 1 tablet (500 mcg total) by mouth daily.     Allergies  Allergen Reactions  . Doxepin Other (See Comments)    Lethargy, overly sedating at low dose  . Lincomycin Hcl     REACTION: RASH    Social History   Socioeconomic History  . Marital status: Married    Spouse name: Not on file  . Number of children: 3  . Years of education: Not on file  . Highest education level: Not on file  Occupational History  . 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 Smoking status: Former Smoker    Types: Cigarettes    Quit date: 11/11/1960    Years  since quitting: 59.4  . Smokeless tobacco: Never Used  Substance and Sexual Activity  . Alcohol use: No  . Drug use: No  . Sexual activity: Not on file  Other Topics Concern  . 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 Difficulty of Paying Living Expenses:   Food Insecurity:   . Worried About Charity fundraiser in the Last Year:   . Arboriculturist in the Last Year:   Transportation Needs:   . Film/video editor (Medical):   Marland Kitchen Lack of Transportation (Non-Medical):   Physical Activity:   . Days of Exercise per Week:   . Minutes of Exercise per Session:   Stress:   . Feeling of Stress :   Social Connections:   . Frequency of Communication with Friends and Family:   . Frequency of Social Gatherings with Friends and Family:   . Attends Religious Services:   . Active Member of Clubs or Organizations:   . Attends Archivist Meetings:   Marland Kitchen Marital Status:   Intimate Partner Violence:   . Fear  of Current or Ex-Partner:   . Emotionally Abused:   Marland Kitchen Physically Abused:   . Sexually Abused:      Review of Systems: General: negative for chills, fever, night sweats or weight changes.  Cardiovascular: negative for chest pain, dyspnea on exertion, edema, orthopnea, palpitations, paroxysmal nocturnal dyspnea or shortness of breath Dermatological: negative for rash Respiratory: negative for cough or wheezing Urologic: negative for hematuria Abdominal: negative for nausea, vomiting, diarrhea, bright red blood per rectum, melena, or hematemesis Neurologic: negative for visual changes, syncope, or dizziness All other systems reviewed and are otherwise negative except as noted above.    Blood pressure 126/72, pulse (!) 54, temperature (!) 97.2 F (36.2 C), height 6\' 1"  (1.854 m), weight 197  lb 3.2 oz (89.4 kg), SpO2 96 %.  General appearance: alert and no distress Neck: no adenopathy, no carotid bruit, no JVD, supple, symmetrical, trachea midline and thyroid not enlarged, symmetric, no tenderness/mass/nodules Lungs: clear to auscultation bilaterally Heart: regular rate and rhythm, S1, S2 normal, no murmur, click, rub or gallop Extremities: extremities normal, atraumatic, no cyanosis or edema Pulses: Diminished right pedal pulse Skin: Skin color, texture, turgor normal. No rashes or lesions Neurologic: Alert and oriented X 3, normal strength and tone. Normal symmetric reflexes. Normal coordination and gait  EKG sinus bradycardia 54 with right bundle branch block and left axis deviation.  I personally reviewed this EKG. ASSESSMENT AND PLAN:   PAD (peripheral artery disease) [facility]) Mr. [name] was referred to me by Dr. Danise [name] for evaluation of PAD.  He is an 86 year old gentleman with no cardiac risk factors who does have significant dementia.  He is minimally ambulatory, denies claudication and has no evidence of critical limb ischemia.  He did have Doppler studies however performed 03/15/2020 revealing a right ABI of 0.79 and a left of 1.22 with what appears to be a possible high-grade lesion in the right popliteal artery with monophasic waveforms below that.  His left side is essentially normal.  At this point, I do not feel there is an indication to perform an invasive procedure.  I discussed this thoroughly with the patient and his wife and they agree.      Dylan [name] FACP,FACC,FAHA, [facility] 03/28/2020 1:40 PM

## 2020-03-28 NOTE — Patient Instructions (Signed)
Medication Instructions:  Your physician recommends that you continue on your current medications as directed. Please refer to the Current Medication list given to you today.  *If you need a refill on your cardiac medications before your next appointment, please call your pharmacy*  Lab Work: NONE ordered at this time of appointment   If you have labs (blood work) drawn today and your tests are completely normal, you will receive your results only by: Marland Kitchen MyChart Message (if you have MyChart) OR . A paper copy in the mail If you have any lab test that is abnormal or we need to change your treatment, we will call you to review the results.  Testing/Procedures: NONE ordered at this time of appointment   Follow-Up: At [facility], you and your health needs are our priority.  As part of our continuing mission to provide you with exceptional heart care, we have created designated Provider Care Teams.  These Care Teams include your primary Cardiologist (physician) and Advanced Practice Providers (APPs -  Physician Assistants and Nurse Practitioners) who all work together to provide you with the care you need, when you need it.  We recommend signing up for the patient portal called "MyChart".  Sign up information is provided on this After Visit Summary.  MyChart is used to connect with patients for Virtual Visits (Telemedicine).  Patients are able to view lab/test results, encounter notes, upcoming appointments, etc.  Non-urgent messages can be sent to your provider as well.   To learn more about what you can do with MyChart, go to [email or web address].    Your next appointment:   AS NEEDED   The format for your next appointment:   Either In Person or Virtual  Provider:   You may see Lorretta Harp, MD or one of the following Advanced Practice Providers on your designated Care Team:    Kerin Ransom, PA-C  [place], [place]  Coletta Memos, [place]   Other Instructions

## 2020-03-28 NOTE — Assessment & Plan Note (Signed)
Dylan [name] was referred to me by Dr. Danise Mina for evaluation of PAD.  He is an 86 year old gentleman with no cardiac risk factors who does have significant dementia.  He is minimally ambulatory, denies claudication and has no evidence of critical limb ischemia.  He did have Doppler studies however performed 03/15/2020 revealing a right ABI of 0.79 and a left of 1.22 with what appears to be a possible high-grade lesion in the right popliteal artery with monophasic waveforms below that.  His left side is essentially normal.  At this point, I do not feel there is an indication to perform an invasive procedure.  I discussed this thoroughly with the patient and his wife and they agree.

## 2020-04-03 ENCOUNTER — Telehealth: Payer: Self-pay | Admitting: Emergency Medicine

## 2020-04-03 NOTE — Telephone Encounter (Signed)
Received alert from [facility] that showed episode of possible AF with RVR that occurred on 03/30/20 at 1226 and lasted 44 minutes with max v-rate of 140 bpm. Spoke with wife per DPR. Patient has dementia and has no verbal complaints. She reports that he has no SOB but occasionally will make a " noise" after walking through the house like he is breathing "heavy".  Unable to assess for increased pedal edema due to PAD that is being evaluated by Dr [name] who assessed patient 03/27/20.No changes from patient's baseline health or appearance according to his wife. Remote transmission sent today and current EGM is SR.

## 2020-04-04 NOTE — Telephone Encounter (Signed)
Appears to be fibrillation.  Rate is quite fast.  Would hold off on anticoagulation as less than 1 hour.  We Dylan [name] start metoprolol 25 mg twice daily.

## 2020-04-05 MED ORDER — METOPROLOL TARTRATE 25 MG PO TABS
25.0000 mg | ORAL_TABLET | Freq: Two times a day (BID) | ORAL | 3 refills | Status: DC
Start: 2020-04-05 — End: 2020-04-21

## 2020-04-05 NOTE — Telephone Encounter (Signed)
Contacted patient's wife and informed that patient is to start metoprolol 25 mg twice a day per Dr Curt Bears. Education done on beta blockers, wife reports patient's SBP ranges from 110-120s at doctors visits and his HR is documented in the 70's. Patient reported to be " sensitive" to medications and difficult to assess due to his dementia. Wife to contact office if patient's condition changes after starting medication. Pharmacy verified and order sent.

## 2020-04-05 NOTE — Addendum Note (Signed)
Addended by: Drake Leach on: 04/05/2020 12:50 PM   Modules accepted: Orders

## 2020-04-24 ENCOUNTER — Ambulatory Visit (INDEPENDENT_AMBULATORY_CARE_PROVIDER_SITE_OTHER): Payer: Medicare Other | Admitting: *Deleted

## 2020-04-24 DIAGNOSIS — R55 Syncope and collapse: Secondary | ICD-10-CM | POA: Diagnosis not present

## 2020-04-24 LAB — CUP PACEART REMOTE DEVICE CHECK
Date Time Interrogation Session: 20210613235901
Implantable Pulse Generator Implant Date: 20191120

## 2020-04-25 NOTE — Progress Notes (Signed)
Carelink Summary Report / Loop Recorder 

## 2020-05-29 ENCOUNTER — Ambulatory Visit (INDEPENDENT_AMBULATORY_CARE_PROVIDER_SITE_OTHER): Payer: Medicare Other | Admitting: *Deleted

## 2020-05-29 DIAGNOSIS — R55 Syncope and collapse: Secondary | ICD-10-CM | POA: Diagnosis not present

## 2020-05-29 LAB — CUP PACEART REMOTE DEVICE CHECK
Date Time Interrogation Session: 20210718232332
Implantable Pulse Generator Implant Date: 20191120

## 2020-05-30 ENCOUNTER — Emergency Department: Payer: Medicare Other

## 2020-05-30 ENCOUNTER — Other Ambulatory Visit: Payer: Self-pay

## 2020-05-30 ENCOUNTER — Encounter: Payer: Self-pay | Admitting: Emergency Medicine

## 2020-05-30 ENCOUNTER — Inpatient Hospital Stay
Admission: EM | Admit: 2020-05-30 | Discharge: 2020-06-02 | DRG: 872 | Disposition: A | Discharge status: Home / Self Care | Payer: Medicare Other | Attending: Internal Medicine | Admitting: Internal Medicine

## 2020-05-30 DIAGNOSIS — Z85828 Personal history of other malignant neoplasm of skin: Secondary | ICD-10-CM

## 2020-05-30 DIAGNOSIS — I452 Bifascicular block: Secondary | ICD-10-CM | POA: Diagnosis not present

## 2020-05-30 DIAGNOSIS — A419 Sepsis, unspecified organism: Secondary | ICD-10-CM | POA: Diagnosis not present

## 2020-05-30 DIAGNOSIS — Z20822 Contact with and (suspected) exposure to covid-19: Secondary | ICD-10-CM | POA: Diagnosis not present

## 2020-05-30 DIAGNOSIS — F0390 Unspecified dementia without behavioral disturbance: Secondary | ICD-10-CM | POA: Diagnosis present

## 2020-05-30 DIAGNOSIS — Z806 Family history of leukemia: Secondary | ICD-10-CM | POA: Diagnosis not present

## 2020-05-30 DIAGNOSIS — I129 Hypertensive chronic kidney disease with stage 1 through stage 4 chronic kidney disease, or unspecified chronic kidney disease: Secondary | ICD-10-CM | POA: Diagnosis not present

## 2020-05-30 DIAGNOSIS — R6520 Severe sepsis without septic shock: Secondary | ICD-10-CM | POA: Diagnosis present

## 2020-05-30 DIAGNOSIS — J90 Pleural effusion, not elsewhere classified: Secondary | ICD-10-CM | POA: Diagnosis not present

## 2020-05-30 DIAGNOSIS — Z87891 Personal history of nicotine dependence: Secondary | ICD-10-CM

## 2020-05-30 DIAGNOSIS — D72829 Elevated white blood cell count, unspecified: Secondary | ICD-10-CM

## 2020-05-30 DIAGNOSIS — N183 Chronic kidney disease, stage 3 unspecified: Secondary | ICD-10-CM | POA: Diagnosis present

## 2020-05-30 DIAGNOSIS — E785 Hyperlipidemia, unspecified: Secondary | ICD-10-CM | POA: Diagnosis present

## 2020-05-30 DIAGNOSIS — Z881 Allergy status to other antibiotic agents status: Secondary | ICD-10-CM | POA: Diagnosis not present

## 2020-05-30 DIAGNOSIS — Z823 Family history of stroke: Secondary | ICD-10-CM

## 2020-05-30 DIAGNOSIS — Z888 Allergy status to other drugs, medicaments and biological substances status: Secondary | ICD-10-CM

## 2020-05-30 DIAGNOSIS — E1122 Type 2 diabetes mellitus with diabetic chronic kidney disease: Secondary | ICD-10-CM | POA: Diagnosis not present

## 2020-05-30 DIAGNOSIS — Z8249 Family history of ischemic heart disease and other diseases of the circulatory system: Secondary | ICD-10-CM | POA: Diagnosis not present

## 2020-05-30 DIAGNOSIS — R0602 Shortness of breath: Secondary | ICD-10-CM | POA: Diagnosis not present

## 2020-05-30 DIAGNOSIS — N179 Acute kidney failure, unspecified: Secondary | ICD-10-CM | POA: Diagnosis present

## 2020-05-30 DIAGNOSIS — R531 Weakness: Secondary | ICD-10-CM | POA: Diagnosis not present

## 2020-05-30 HISTORY — DX: Unspecified dementia, unspecified severity, without behavioral disturbance, psychotic disturbance, mood disturbance, and anxiety: F03.90

## 2020-05-30 HISTORY — DX: Sepsis, unspecified organism: A41.9

## 2020-05-30 LAB — CBC
HCT: 43.7 % (ref 39.0–52.0)
Hemoglobin: 15.3 g/dL (ref 13.0–17.0)
MCH: 32.5 pg (ref 26.0–34.0)
MCHC: 35 g/dL (ref 30.0–36.0)
MCV: 92.8 fL (ref 80.0–100.0)
Platelets: 114 10*3/uL — ABNORMAL LOW (ref 150–400)
RBC: 4.71 MIL/uL (ref 4.22–5.81)
RDW: 12.6 % (ref 11.5–15.5)
WBC: 23.1 10*3/uL — ABNORMAL HIGH (ref 4.0–10.5)
nRBC: 0 % (ref 0.0–0.2)

## 2020-05-30 LAB — BASIC METABOLIC PANEL
Anion gap: 11 (ref 5–15)
BUN: 19 mg/dL (ref 8–23)
CO2: 24 mmol/L (ref 22–32)
Calcium: 9.2 mg/dL (ref 8.9–10.3)
Chloride: 100 mmol/L (ref 98–111)
Creatinine, Ser: 1.59 mg/dL — ABNORMAL HIGH (ref 0.61–1.24)
GFR calc Af Amer: 46 mL/min — ABNORMAL LOW (ref >60)
GFR calc non Af Amer: 39 mL/min — ABNORMAL LOW (ref >60)
Glucose, Bld: 158 mg/dL — ABNORMAL HIGH (ref 70–99)
Potassium: 4 mmol/L (ref 3.5–5.1)
Sodium: 135 mmol/L (ref 135–145)

## 2020-05-30 LAB — URINALYSIS, COMPLETE (UACMP) WITH MICROSCOPIC
Bacteria, UA: NONE SEEN
Bilirubin Urine: NEGATIVE
Glucose, UA: NEGATIVE mg/dL
Hgb urine dipstick: NEGATIVE
Ketones, ur: 5 mg/dL — AB
Leukocytes,Ua: NEGATIVE
Nitrite: NEGATIVE
Protein, ur: 100 mg/dL — AB
Specific Gravity, Urine: 1.03 (ref 1.005–1.030)
pH: 5 (ref 5.0–8.0)

## 2020-05-30 LAB — LACTIC ACID, PLASMA
Lactic Acid, Venous: 3.9 mmol/L (ref 0.5–1.9)
Lactic Acid, Venous: 2.5 mmol/L (ref 0.5–1.9)

## 2020-05-30 LAB — SARS CORONAVIRUS 2 BY RT PCR (HOSPITAL ORDER, PERFORMED IN ~~LOC~~ HOSPITAL LAB): SARS Coronavirus 2: NEGATIVE

## 2020-05-30 MED ORDER — ACETAMINOPHEN 325 MG PO TABS: 650.0000 mg | ORAL_TABLET | Freq: Four times a day (QID) | ORAL | Status: DC | PRN

## 2020-05-30 MED ORDER — MEMANTINE HCL ER 14 MG PO CP24
14.0000 mg | ORAL_CAPSULE | Freq: Every day | ORAL | Status: DC
  Administered 2020-05-31 – 2020-06-02 (×3): 14 mg via ORAL
  Filled 2020-05-30 (×3): qty 1

## 2020-05-30 MED ORDER — TRAZODONE HCL 50 MG PO TABS
25.0000 mg | ORAL_TABLET | Freq: Every evening | ORAL | Status: DC | PRN
  Administered 2020-05-31: 25 mg via ORAL
  Filled 2020-05-30: qty 1

## 2020-05-30 MED ORDER — VANCOMYCIN HCL IN DEXTROSE 1-5 GM/200ML-% IV SOLN
1000.0000 mg | Freq: Once | INTRAVENOUS | Status: AC
  Administered 2020-05-30: 1000 mg via INTRAVENOUS
  Filled 2020-05-30: qty 200

## 2020-05-30 MED ORDER — METRONIDAZOLE IN NACL 5-0.79 MG/ML-% IV SOLN
500.0000 mg | Freq: Three times a day (TID) | INTRAVENOUS | Status: DC
  Administered 2020-05-31 – 2020-06-01 (×4): 500 mg via INTRAVENOUS
  Filled 2020-05-30 (×7): qty 100

## 2020-05-30 MED ORDER — HYDROCOD POLST-CPM POLST ER 10-8 MG/5ML PO SUER: 5.0000 mL | Freq: Two times a day (BID) | ORAL | Status: DC | PRN

## 2020-05-30 MED ORDER — GUAIFENESIN ER 600 MG PO TB12
600.0000 mg | ORAL_TABLET | Freq: Two times a day (BID) | ORAL | Status: DC
  Administered 2020-05-31 – 2020-06-02 (×5): 600 mg via ORAL
  Filled 2020-05-30 (×5): qty 1

## 2020-05-30 MED ORDER — SODIUM CHLORIDE 0.9 % IV SOLN: 2.0000 g | Freq: Once | INTRAVENOUS | Status: DC

## 2020-05-30 MED ORDER — CYANOCOBALAMIN 500 MCG PO TABS
500.0000 ug | ORAL_TABLET | Freq: Every day | ORAL | Status: DC
  Administered 2020-05-31 – 2020-06-02 (×3): 500 ug via ORAL
  Filled 2020-05-30 (×3): qty 1

## 2020-05-30 MED ORDER — MAGNESIUM HYDROXIDE 400 MG/5ML PO SUSP
30.0000 mL | Freq: Every day | ORAL | Status: DC | PRN
  Administered 2020-06-01: 30 mL via ORAL
  Filled 2020-05-30: qty 30

## 2020-05-30 MED ORDER — SODIUM CHLORIDE 0.9 % IV SOLN: Freq: Once | INTRAVENOUS | Status: AC

## 2020-05-30 MED ORDER — ONDANSETRON HCL 4 MG PO TABS: 4.0000 mg | ORAL_TABLET | Freq: Four times a day (QID) | ORAL | Status: DC | PRN

## 2020-05-30 MED ORDER — SODIUM CHLORIDE 0.9 % IV SOLN
1.0000 g | Freq: Once | INTRAVENOUS | Status: AC
  Administered 2020-05-30: 1 g via INTRAVENOUS
  Filled 2020-05-30: qty 1

## 2020-05-30 MED ORDER — ACETAMINOPHEN 650 MG RE SUPP: 650.0000 mg | Freq: Four times a day (QID) | RECTAL | Status: DC | PRN

## 2020-05-30 MED ORDER — ONDANSETRON HCL 4 MG/2ML IJ SOLN: 4.0000 mg | Freq: Four times a day (QID) | INTRAMUSCULAR | Status: DC | PRN

## 2020-05-30 MED ORDER — SODIUM CHLORIDE 0.9 % IV BOLUS
1000.0000 mL | Freq: Once | INTRAVENOUS | Status: AC
  Administered 2020-05-30: 1000 mL via INTRAVENOUS

## 2020-05-30 MED ORDER — ENOXAPARIN SODIUM 40 MG/0.4ML ~~LOC~~ SOLN
40.0000 mg | SUBCUTANEOUS | Status: DC
  Administered 2020-05-31 – 2020-06-01 (×2): 40 mg via SUBCUTANEOUS
  Filled 2020-05-30 (×2): qty 0.4

## 2020-05-30 MED ORDER — VITAMIN D 25 MCG (1000 UNIT) PO TABS
1000.0000 [IU] | ORAL_TABLET | Freq: Every day | ORAL | Status: DC
  Administered 2020-05-31 – 2020-06-02 (×3): 1000 [IU] via ORAL
  Filled 2020-05-30 (×3): qty 1

## 2020-05-30 MED ORDER — SODIUM CHLORIDE 0.9 % IV SOLN: INTRAVENOUS | Status: DC

## 2020-05-30 MED ORDER — VANCOMYCIN HCL IN DEXTROSE 1-5 GM/200ML-% IV SOLN: 1000.0000 mg | Freq: Once | INTRAVENOUS | Status: DC

## 2020-05-30 NOTE — ED Triage Notes (Signed)
Pt in via POV; per wife, pt with episode of tremors, generalized weakness, and shallow breathing PTA.  Pt with advanced dementia, unable to express why he is here.  Vitals WDL, no tremors noted at this time.  NAD noted.

## 2020-05-30 NOTE — ED Provider Notes (Signed)
[facility] Emergency Department Provider Note   ____________________________________________   I have reviewed the triage vital signs and the nursing notes.   HISTORY  Chief Complaint Weakness  History limited by and level 5 caveat due to: Dementia  HPI Dylan [name] is a 86 y.o. male who presents to the emergency department today brought in accompanied by family because of concern for weakness. The patient's family states that they noticed he started complaining of weakness today. Had a normal day for himself yesterday. Family also noticed episode of shortness of breath however this did not last particularly long. Patient has not complained of any chest or abdominal pain. Normal nausea and vomiting.    Records reviewed. Per medical record review patient has a history of dementia.   Past Medical History:  Diagnosis Date  . Cellulitis    due to nail impaction thru boot L  . Dementia ([place])   . Hyperlipidemia 12/1999    Patient Active Problem List   Diagnosis Date Noted  . PAD (peripheral artery disease) ([place]) 03/21/2020  . Right leg swelling 02/19/2020  . Dysphagia 02/19/2020  . Pruritus 02/19/2020  . Medicare annual wellness visit, subsequent 08/11/2019  . CKD (chronic kidney disease) stage 3, GFR 30-59 ml/min 01/13/2019  . Health maintenance examination 08/03/2018  . Advanced care planning/counseling discussion 08/03/2018  . Vitamin B12 deficiency 08/03/2018  . Bilateral hearing loss 08/03/2018  . Recurrent syncope 06/22/2018  . Vitamin D deficiency 05/19/2018  . Fall 05/19/2018  . Mild cognitive impairment with memory loss 05/19/2018  . Nuclear sclerosis, left 04/01/2017  . Cataract extraction status 03/06/2017  . Obstruction of right lacrimal duct 10/16/2015  . 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 Eversion of right lacrimal punctum 05/17/2015  . Epiphora due to insufficient drainage of right side 05/17/2015  . 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 Basal cell carcinoma of right nasal sidewall 12/15/2014  . Mohs defect of tear duct of right eye 12/15/2014  . 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 Mohs defect of eyelid 12/14/2014  . Thrombocytopenia ([place]) 02/21/2011  . Dyslipidemia 02/04/2008  . 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 ERECTILE DYSFUNCTION, MILD 02/03/2007  . HYDROCELE, LEFT 02/03/2007    Past Surgical History:  Procedure Laterality Date  . BASAL CELL CARCINOMA EXCISION Right 01/2015   with reconstructive surgery around eye  . CATARACT EXTRACTION Left 03/2017  . ETT  09/2018   negative for ischemia, extremely poor exercise tolerance ([place])  . 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 KNEE ARTHROSCOPY  1993   Right  . KNEE ARTHROSCOPY  01/2001   Left  . LOOP RECORDER INSERTION N/A 09/30/2018   Procedure: LOOP RECORDER INSERTION;  Surgeon: Constance Haw, MD;  Location: [place] CV [facility];  Service: Cardiovascular;  Laterality: N/A;    Prior to Admission medications   Medication Sig Start Date End Date Taking? Authorizing Provider  Cholecalciferol (VITAMIN D3) 1000 units CAPS Take 1 capsule (1,000 Units total) by mouth daily. 08/03/18   [name], MD  memantine (NAMENDA XR) 14 MG CP24 24 hr capsule Take 1 capsule (14 mg total) by mouth daily. 08/11/19   [name], MD  vitamin B-12 (V-R VITAMIN B-12) 500 MCG tablet Take 1 tablet (500 mcg total) by mouth daily. 08/11/19   [name], MD    Allergies Doxepin and Lincomycin hcl  Family History  Problem Relation Age  of Onset  . Congestive Heart Failure Mother   . Stroke Father 15  . Congestive Heart Failure Father   . Cataracts Sister   . Leukemia Brother     Social History Social History   Tobacco Use  . Smoking status: Former Smoker    Types: Cigarettes    Quit date: 11/11/1960    Years since quitting: 59.5  .  Smokeless tobacco: Never Used  Vaping Use  . Vaping Use: Never used  Substance Use Topics  . Alcohol use: No  . Drug use: No    Review of Systems Unable to obtain reliable ROS secondary to dementia. ____________________________________________   PHYSICAL EXAM:  VITAL SIGNS: ED Triage Vitals  Enc Vitals Group     BP 05/30/20 1617 98/74     Pulse Rate 05/30/20 1617 97     Resp 05/30/20 1617 15     Temp 05/30/20 1617 98.4 F (36.9 C)     Temp Source 05/30/20 1617 Oral     SpO2 05/30/20 1617 97 %     Weight 05/30/20 1620 190 lb (86.2 kg)     Height 05/30/20 1620 5\' 9"  (1.753 m)   Constitutional: Awake and alert.  Eyes: Conjunctivae are normal.  ENT      Head: Normocephalic and atraumatic.      Ears: Bilateral TMs wnl      Nose: No congestion/rhinnorhea.      Mouth/Throat: Mucous membranes are moist.      Neck: No stridor. Hematological/Lymphatic/Immunilogical: No cervical lymphadenopathy. Cardiovascular: Normal rate, regular rhythm.  No murmurs, rubs, or gallops.  Respiratory: Normal respiratory effort without tachypnea nor retractions. Breath sounds are clear and equal bilaterally. No wheezes/rales/rhonchi. Gastrointestinal: Soft and non tender. No rebound. No guarding.  Genitourinary: Deferred Musculoskeletal: Normal range of motion in all extremities. No lower extremity edema. Neurologic: Awake and alert. Demented. Moving all extremities.  Skin:  Skin is warm, dry and intact. No rash noted. ____________________________________________    LABS (pertinent positives/negatives)  CBC wbc 23.1, hgb 15.3, plt 114 Lactic acid 3.9 COVID negative BMP wnl except glu 158, cr 1.59  ____________________________________________   EKG  I, Nance Pear, attending physician, personally viewed and interpreted this EKG  EKG Time: 1617 Rate: 91 Rhythm: sinus rhythm with pac Axis: left axis deviation Intervals: qtc 452 QRS: RBBB, LAFB ST changes: no st  elevation Impression: abnormal ekg  ____________________________________________    RADIOLOGY  CXR No acute abnormality  ____________________________________________   PROCEDURES  Procedures  ____________________________________________   INITIAL IMPRESSION / ASSESSMENT AND PLAN / ED COURSE  Pertinent labs & imaging results that were available during my care of the patient were reviewed by me and considered in my medical decision making (see chart for details).   Patient presented to the emergency department today because of concern for weakness. Blood work is concerning for both leukocytosis as well as elevated lactic acid level. Given concern for possible infection patient was started on broad spectrum antibiotics as well as IV fluids. CXR without any pneumonia. COVID negative. Will plan on admission.   ____________________________________________   FINAL CLINICAL IMPRESSION(S) / ED DIAGNOSES  Final diagnoses:  Weakness  Leukocytosis, unspecified type     Note: This dictation was prepared with Dragon dictation. Any transcriptional errors that result from this process are unintentional     Nance Pear, MD 05/30/20 2252

## 2020-05-30 NOTE — ED Notes (Signed)
Pt has dementia.  Wife states pt had and episode of tremors, weakness this am.  Pt became nonverbal.   Pt denies pain at this time.  nsr on monitor.

## 2020-05-30 NOTE — ED Notes (Signed)
Pt unable to void at this time. 

## 2020-05-31 ENCOUNTER — Encounter: Payer: Self-pay | Admitting: Family Medicine

## 2020-05-31 DIAGNOSIS — J189 Pneumonia, unspecified organism: Secondary | ICD-10-CM

## 2020-05-31 LAB — CBC
HCT: 37.8 % — ABNORMAL LOW (ref 39.0–52.0)
Hemoglobin: 13 g/dL (ref 13.0–17.0)
MCH: 32.3 pg (ref 26.0–34.0)
MCHC: 34.4 g/dL (ref 30.0–36.0)
MCV: 94 fL (ref 80.0–100.0)
Platelets: 95 10*3/uL — ABNORMAL LOW (ref 150–400)
RBC: 4.02 MIL/uL — ABNORMAL LOW (ref 4.22–5.81)
RDW: 12.8 % (ref 11.5–15.5)
WBC: 16.5 10*3/uL — ABNORMAL HIGH (ref 4.0–10.5)
nRBC: 0 % (ref 0.0–0.2)

## 2020-05-31 LAB — PROCALCITONIN
Procalcitonin: 0.73 ng/mL
Procalcitonin: 0.75 ng/mL

## 2020-05-31 LAB — CK: Total CK: 234 U/L (ref 49–397)

## 2020-05-31 LAB — BASIC METABOLIC PANEL
Anion gap: 8 (ref 5–15)
BUN: 22 mg/dL (ref 8–23)
CO2: 24 mmol/L (ref 22–32)
Calcium: 8.2 mg/dL — ABNORMAL LOW (ref 8.9–10.3)
Chloride: 104 mmol/L (ref 98–111)
Creatinine, Ser: 1.5 mg/dL — ABNORMAL HIGH (ref 0.61–1.24)
GFR calc Af Amer: 49 mL/min — ABNORMAL LOW (ref >60)
GFR calc non Af Amer: 42 mL/min — ABNORMAL LOW (ref >60)
Glucose, Bld: 100 mg/dL — ABNORMAL HIGH (ref 70–99)
Potassium: 3.7 mmol/L (ref 3.5–5.1)
Sodium: 136 mmol/L (ref 135–145)

## 2020-05-31 LAB — PROTIME-INR
INR: 1.2 (ref 0.8–1.2)
Prothrombin Time: 14.8 seconds (ref 11.4–15.2)

## 2020-05-31 LAB — LACTIC ACID, PLASMA
Lactic Acid, Venous: 1.3 mmol/L (ref 0.5–1.9)
Lactic Acid, Venous: 1 mmol/L (ref 0.5–1.9)

## 2020-05-31 LAB — CORTISOL-AM, BLOOD: Cortisol - AM: 12 ug/dL (ref 6.7–22.6)

## 2020-05-31 MED ORDER — SODIUM CHLORIDE 0.9 % IV SOLN
2.0000 g | Freq: Two times a day (BID) | INTRAVENOUS | Status: DC
  Administered 2020-05-31 (×2): 2 g via INTRAVENOUS
  Filled 2020-05-31 (×5): qty 2

## 2020-05-31 MED ORDER — CIPROFLOXACIN HCL 0.3 % OP SOLN
1.0000 [drp] | OPHTHALMIC | Status: DC
  Administered 2020-05-31 – 2020-06-02 (×6): 1 [drp] via OPHTHALMIC
  Filled 2020-05-31 (×2): qty 2.5

## 2020-05-31 MED ORDER — VANCOMYCIN HCL IN DEXTROSE 1-5 GM/200ML-% IV SOLN
1000.0000 mg | INTRAVENOUS | Status: DC
  Filled 2020-05-31 (×2): qty 200

## 2020-05-31 NOTE — Progress Notes (Signed)
Received report from [place], [place]. Assuming care of patient at this time. Patient in no signs of distress, A/O x 4, son at bedside. Call bell in reach.

## 2020-05-31 NOTE — Plan of Care (Signed)

## 2020-05-31 NOTE — Progress Notes (Signed)
Patient ID: SHANT HENCE, male   DOB: Apr 18, 1934, 86 y.o.   MRN: [id number] This is a no charge note Patient seen and examined H&P reviewed. Paxton Binns  is a 86 y.o. male with a known history of dementia and dyslipidemia, who presented to the emergency room with acute onset of worsening generalized weakness since yesterday with associated mild dyspnea that was noted by his family.  Wife at bedside reports he does look better and his breathing is better.  She is complaining of redness and of his eyes.  Nursing reported some green discharge and redness.  Heent: b/l mild redness, no discharge cta no r/r/w Regular s1/s2 Soft benign No edema  A/p Continue abx Will add cipro ophthalmic b/l eyes

## 2020-05-31 NOTE — ED Notes (Signed)
Report called to linisse rn floor nurse

## 2020-05-31 NOTE — Progress Notes (Signed)
Pt's Right eye has green drainage and erythema. RN notified Kurtis Bushman MD

## 2020-05-31 NOTE — H&P (Signed)
[facility] NAME: Dylan [name]    MR#:  [id number]  DATE OF BIRTH:  [birth year]  DATE OF ADMISSION:  05/30/2020  PRIMARY CARE PHYSICIAN: [name], MD   REQUESTING/REFERRING PHYSICIAN: Nance Pear, MD  CHIEF COMPLAINT:   Chief Complaint  Patient presents with  . Weakness  . Shortness of Breath    HISTORY OF PRESENT ILLNESS:  Dylan [name]  is a 86 y.o. male with a known history of dementia and dyslipidemia, who presented to the emergency room with acute onset of worsening generalized weakness since yesterday with associated mild dyspnea that was noted by his family.  He did not have any reported cough or wheezing however he was slightly tachypneic in the ER.  He did not have any reported fever or chills.  No neck pain or stiffness.  No nausea or vomiting or abdominal pain.  No dysuria, oliguria or hematuria or flank pain however the patient's fairly poor historian due to his dementia.    Upon presentation to the emergency room, vital signs were within normal with a temperature of 98.2 and respiratory rate 15 then and was directed 30.  Labs revealed a UA with 6-10 WBCs with no bacteria and negative nitrite and 5 ketones with negative leukocytes.  CBC showed significant leukocytosis of 23.1 and lactic acid of 3.9 and later 2.5.  BMP was remarkable for a creatinine of 1.59 up from 1.18 on 02/22/2020.  2 blood cultures were drawn.  Chest x-ray showed no acute cardiopulmonary disease though there may have been slightly worsening densities in both bases mainly on the right compared to previous chest x-ray per my review.  The patient was empirically given IV vancomycin and cefepime, 1 L bolus of IV normal saline followed by 150 mL/h.  He will be admitted to a medically monitored bed for further evaluation and management.   PAST MEDICAL HISTORY:   Past Medical History:  Diagnosis Date  . Cellulitis    due to nail impaction thru boot L  .  Dementia ([place])   . Hyperlipidemia 12/1999    PAST SURGICAL HISTORY:   Past Surgical History:  Procedure Laterality Date  . BASAL CELL CARCINOMA EXCISION Right 01/2015   with reconstructive surgery around eye  . CATARACT EXTRACTION Left 03/2017  . ETT  09/2018   negative for ischemia, extremely poor exercise tolerance ([place])  . 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 KNEE ARTHROSCOPY  1993   Right  . KNEE ARTHROSCOPY  01/2001   Left  . LOOP RECORDER INSERTION N/A 09/30/2018   Procedure: LOOP RECORDER INSERTION;  Surgeon: Constance Haw, MD;  Location: [facility];  Service: Cardiovascular;  Laterality: N/A;    SOCIAL HISTORY:   Social History   Tobacco Use  . Smoking status: Former Smoker    Types: Cigarettes    Quit date: 11/11/1960    Years since quitting: 59.5  . Smokeless tobacco: Never Used  Substance Use Topics  . Alcohol use: No    FAMILY HISTORY:   Family History  Problem Relation Age of Onset  . Congestive Heart Failure Mother   . Stroke Father 75  . Congestive Heart Failure Father   . Cataracts Sister   . Leukemia Brother     DRUG ALLERGIES:   Allergies  Allergen Reactions  . Doxepin Other (See Comments)    Lethargy, overly sedating at low dose  . Lincomycin Hcl  Rash    REVIEW OF SYSTEMS:   ROS As per history of present illness. All pertinent systems were reviewed above. Constitutional, HEENT, cardiovascular, respiratory, GI, GU, musculoskeletal, neuro, psychiatric, endocrine, integumentary and hematologic systems were reviewed and are otherwise negative/unremarkable except for positive findings mentioned above in the HPI.   MEDICATIONS AT HOME:   Prior to Admission medications   Medication Sig Start Date End Date Taking? Authorizing Provider  Cholecalciferol (VITAMIN D3) 1000 units CAPS Take 1 capsule (1,000 Units total) by mouth daily. 08/03/18  Yes [name], MD  memantine (NAMENDA XR) 14 MG  CP24 24 hr capsule Take 1 capsule (14 mg total) by mouth daily. 08/11/19  Yes [name], MD  vitamin B-12 (V-R VITAMIN B-12) 500 MCG tablet Take 1 tablet (500 mcg total) by mouth daily. 08/11/19  Yes [name], MD      VITAL SIGNS:  Blood pressure (!) 115/58, pulse 72, temperature 98.4 F (36.9 C), temperature source Oral, resp. rate (!) 25, height 5\' 9"  (1.753 m), weight 86.2 kg, SpO2 97 %.  PHYSICAL EXAMINATION:  Physical Exam  GENERAL:  86 y.o.-year-old pleasant Caucasian male patient lying in the bed with no acute distress.  EYES: Pupils equal, round, reactive to light and accommodation. No scleral icterus. Extraocular muscles intact.  HEENT: Head atraumatic, normocephalic. Oropharynx and nasopharynx clear.  NECK:  Supple, no jugular venous distention. No thyroid enlargement, no tenderness.  LUNGS: Normal breath sounds bilaterally, no wheezing, rales,rhonchi or crepitation. No use of accessory muscles of respiration.  CARDIOVASCULAR: Regular rate and rhythm, S1, S2 normal. No murmurs, rubs, or gallops.  ABDOMEN: Soft, nondistended, nontender. Bowel sounds present. No organomegaly or mass.  EXTREMITIES: No pedal edema, cyanosis, or clubbing.  NEUROLOGIC: Cranial nerves II through XII are intact. Muscle strength 5/5 in all extremities. Sensation intact. Gait not checked.  PSYCHIATRIC: The patient is alert and oriented x 3.  Normal affect and good eye contact. SKIN: No obvious rash, lesion, or ulcer.   LABORATORY PANEL:   CBC Recent Labs  Lab 05/30/20 1622  WBC 23.1*  HGB 15.3  HCT 43.7  PLT 114*   ------------------------------------------------------------------------------------------------------------------  Chemistries  Recent Labs  Lab 05/30/20 1622  NA 135  K 4.0  CL 100  CO2 24  GLUCOSE 158*  BUN 19  CREATININE 1.59*  CALCIUM 9.2    ------------------------------------------------------------------------------------------------------------------  Cardiac Enzymes No results for input(s): TROPONINI in the last 168 hours. ------------------------------------------------------------------------------------------------------------------  RADIOLOGY:  DG Chest Portable 1 View  Result Date: 05/30/2020 CLINICAL DATA:  Shortness of breath EXAM: PORTABLE CHEST 1 VIEW COMPARISON:  09/19/2018 FINDINGS: Recording device over the left chest. No focal opacity or pleural effusion. Stable cardiomediastinal silhouette. No pneumothorax. IMPRESSION: No active disease. Electronically Signed   By: Donavan Foil M.D.   On: 05/30/2020 21:15      IMPRESSION AND PLAN:   1.  Suspected sepsis with significant leukocytosis, elevated lactic acid, tachypnea and generalized weakness and acute kidney injury.  The patient has reported dyspnea as well.  The etiology is not clear but I suspect respiratory etiology could be related to early pneumonia.  His urinalysis was mostly unremarkable with only 6-10 WBCs on microscopic exam. -The patient will be admitted to a medical monitored bed. -We will continue IV antibiotic therapy with broad-spectrum coverage.  Vancomycin, cefepime and Flagyl. -We will follow blood and urine culture and obtain sputum culture if he develops any cough. -We will be hydrated with IV normal saline. -We will follow lactic acid level  and check procalcitonin.  2.  Acute kidney injury. -I suspect prerenal etiology and it could be related to sepsis -He will be hydrated with IV normal saline and will follow his creatinine.  3.  Dementia. -We will continue his Namenda and vitamin B12.  4.  DVT prophylaxis. -Subcutaneous Lovenox   All the records are reviewed and case discussed with ED provider. The plan of care was discussed in details with the patient (and family). I answered all questions. The patient agreed to proceed  with the above mentioned plan. Further management will depend upon hospital course.   CODE STATUS: Full code  Status is: Inpatient  Remains inpatient appropriate because:Altered mental status, Ongoing diagnostic testing needed not appropriate for outpatient work up, Unsafe d/c plan, IV treatments appropriate due to intensity of illness or inability to take PO and Inpatient level of care appropriate due to severity of illness   Dispo: The patient is from: Home              Anticipated d/c is to: Home              Anticipated d/c date is: 2 days              Patient currently is not medically stable to d/c.   TOTAL TIME TAKING CARE OF THIS PATIENT: 55 minutes.    Christel Mormon M.D on 05/31/2020 at 12:23 AM  [facility]   From 7 PM-7 AM, contact night-coverage [email or web address]  CC: Primary care physician; [name], MD   Note: This dictation was prepared with Dragon dictation along with smaller phrase technology. Any transcriptional typo errors that result from this process are unintentional.

## 2020-05-31 NOTE — Progress Notes (Signed)
Carelink Summary Report / Loop Recorder 

## 2020-05-31 NOTE — Progress Notes (Signed)
Pharmacy Antibiotic Note  CARRIE SCHOONMAKER is a 86 y.o. male admitted on 05/30/2020 with sepsis.  Pharmacy has been consulted for Cefepime and Vancomycin dosing.  Plan: Cefepime 2gm IV q12hrs (renally adjusted)  Vancomycin 1000 mg IV Q 24 hrs. Goal AUC 400-550. Expected AUC: 486.7, Css min 13.7 SCr used: 1.59  Height: 5\' 9"  (175.3 cm) Weight: 86.2 kg (190 lb) IBW/kg (Calculated) : 70.7  Temp (24hrs), Avg:99.2 F (37.3 C), Min:98.4 F (36.9 C), Max:99.9 F (37.7 C)  Recent Labs  Lab 05/30/20 1622 05/30/20 2045 05/30/20 2250  WBC 23.1*  --   --   CREATININE 1.59*  --   --   LATICACIDVEN  --  3.9* 2.5*    Estimated Creatinine Clearance: 37.6 mL/min (A) (by C-G formula based on SCr of 1.59 mg/dL (H)).    Allergies  Allergen Reactions   Doxepin Other (See Comments)    Lethargy, overly sedating at low dose   Lincomycin Hcl Rash    Antimicrobials this admission:   >>    >>   Dose adjustments this admission:   Microbiology results:  BCx:   UCx:    Sputum:    MRSA PCR:   Thank you for allowing pharmacy to be a part of this patients care.  Hart Robinsons A 05/31/2020 12:48 AM

## 2020-05-31 NOTE — Progress Notes (Signed)
Pt sleeping in bed. Pt now has a low bed and a floor mat do to pt's dementia. Call bell within reach. Pt denies any further needs at this time

## 2020-06-01 DIAGNOSIS — A419 Sepsis, unspecified organism: Principal | ICD-10-CM

## 2020-06-01 DIAGNOSIS — F0390 Unspecified dementia without behavioral disturbance: Secondary | ICD-10-CM

## 2020-06-01 DIAGNOSIS — N179 Acute kidney failure, unspecified: Secondary | ICD-10-CM

## 2020-06-01 LAB — CBC
HCT: 38.6 % — ABNORMAL LOW (ref 39.0–52.0)
Hemoglobin: 13.4 g/dL (ref 13.0–17.0)
MCH: 32.8 pg (ref 26.0–34.0)
MCHC: 34.7 g/dL (ref 30.0–36.0)
MCV: 94.4 fL (ref 80.0–100.0)
Platelets: 89 10*3/uL — ABNORMAL LOW (ref 150–400)
RBC: 4.09 MIL/uL — ABNORMAL LOW (ref 4.22–5.81)
RDW: 13 % (ref 11.5–15.5)
WBC: 7.9 10*3/uL (ref 4.0–10.5)
nRBC: 0 % (ref 0.0–0.2)

## 2020-06-01 LAB — MRSA PCR SCREENING: MRSA by PCR: NEGATIVE

## 2020-06-01 LAB — CREATININE, SERUM
Creatinine, Ser: 1.42 mg/dL — ABNORMAL HIGH (ref 0.61–1.24)
GFR calc Af Amer: 52 mL/min — ABNORMAL LOW (ref >60)
GFR calc non Af Amer: 45 mL/min — ABNORMAL LOW (ref >60)

## 2020-06-01 LAB — PROCALCITONIN: Procalcitonin: 0.55 ng/mL

## 2020-06-01 MED ORDER — SODIUM CHLORIDE 0.9 % IV SOLN
2.0000 g | Freq: Every day | INTRAVENOUS | Status: DC
  Administered 2020-06-01 – 2020-06-02 (×2): 2 g via INTRAVENOUS
  Filled 2020-06-01 (×2): qty 20
  Filled 2020-06-01: qty 2

## 2020-06-01 MED ORDER — AZITHROMYCIN 500 MG PO TABS
500.0000 mg | ORAL_TABLET | Freq: Every day | ORAL | Status: DC
  Administered 2020-06-01 – 2020-06-02 (×2): 500 mg via ORAL
  Filled 2020-06-01 (×2): qty 1

## 2020-06-01 NOTE — TOC Progression Note (Signed)
Transition of Care [facility]) - Progression Note    Patient Details  Name: Dylan [name] MRN: [id number] Date of Birth: 23-Oct-1934  Transition [facility]) CM/SW [place], RN Phone Number: 06/01/2020, 11:26 AM  Clinical Narrative:   Met with the patient and his wife at the bedside He lives home with his wife, she provides care and transportation, he walks independently at home and does not need DME, They had their tub taken out and put in a walk in shower He has 3 adult children that can help if needed They stated they have all the need at home, He has a PCP and is up to date, can afford meds    Expected Discharge Plan: Home/Self Care Barriers to Discharge: Continued Medical Work up  Expected Discharge Plan and Services Expected Discharge Plan: Home/Self Care   Discharge Planning Services: CM Consult   Living arrangements for the past 2 months: Single Family Home                 DME Arranged: N/A         HH Arranged: NA           Social Determinants of Health (SDOH) Interventions    Readmission Risk Interventions No flowsheet data found.

## 2020-06-01 NOTE — Progress Notes (Signed)
PROGRESS NOTE    Dylan [name]  ZOX:[id number] DOB: [birth year] DOA: 05/30/2020 PCP: [name], MD    Brief Narrative:  Dylan [name]  is a 86 y.o. male with a known history of dementia and dyslipidemia, who presented to the emergency room with acute onset of worsening generalized weakness since yesterday with associated mild dyspnea that was noted by his family.  He did not have any reported cough or wheezing however he was slightly tachypneic in the ER.  He did not have any reported fever or chills.  No neck pain or stiffness.  No nausea or vomiting or abdominal pain.  No dysuria, oliguria or hematuria or flank pain however the patient's fairly poor historian due to his dementia.      Consultants:     Procedures:   Antimicrobials:   Ceftriaxone and azithromycin   Subjective: No complaints.  Eye redness better.  Wife at bedside  Objective: Vitals:   05/31/20 1315 05/31/20 1523 06/01/20 0050 06/01/20 0740  BP: (!) 90/53 (!) 102/58 (!) 93/50 112/78  Pulse: (!) 57 74 85 77  Resp: 18 19 17 17   Temp: 97.8 F (36.6 C) 98 F (36.7 C) 98 F (36.7 C) (!) 97.5 F (36.4 C)  TempSrc: Oral Oral Oral   SpO2: 96% 96% 96% 98%  Weight:      Height:        Intake/Output Summary (Last 24 hours) at 06/01/2020 1515 Last data filed at 06/01/2020 1124 Gross per 24 hour  Intake 480 ml  Output 725 ml  Net -245 ml   Filed Weights   05/30/20 1620  Weight: 86.2 kg    Examination:  General exam: Appears calm and comfortable, NAD Respiratory system: CTA, no wheeze rales rhonchi's Cardiovascular system: Regular S1-S2 S1 & S2 heard, no murmur  Gastrointestinal system: Soft nontender nondistended positive bowel sounds  Central nervous system: Alert and oriented x2 not to date Extremities: No edema Skin: Warm dry Psychiatry:  Mood & affect appropriate in current setting.     Data Reviewed: I have personally reviewed following labs and imaging studies  CBC: Recent Labs  Lab  05/30/20 1622 05/31/20 0206 06/01/20 0547  WBC 23.1* 16.5* 7.9  HGB 15.3 13.0 13.4  HCT 43.7 37.8* 38.6*  MCV 92.8 94.0 94.4  PLT 114* 95* 89*   Basic Metabolic Panel: Recent Labs  Lab 05/30/20 1622 05/31/20 0206 06/01/20 0547  NA 135 136  --   K 4.0 3.7  --   CL 100 104  --   CO2 24 24  --   GLUCOSE 158* 100*  --   BUN 19 22  --   CREATININE 1.59* 1.50* 1.42*  CALCIUM 9.2 8.2*  --    GFR: Estimated Creatinine Clearance: 42.1 mL/min (A) (by C-G formula based on SCr of 1.42 mg/dL (H)). Liver Function Tests: No results for input(s): AST, ALT, ALKPHOS, BILITOT, PROT, ALBUMIN in the last 168 hours. No results for input(s): LIPASE, AMYLASE in the last 168 hours. No results for input(s): AMMONIA in the last 168 hours. Coagulation Profile: Recent Labs  Lab 05/31/20 0206  INR 1.2   Cardiac Enzymes: Recent Labs  Lab 05/31/20 0206  CKTOTAL 234   BNP (last 3 results) No results for input(s): PROBNP in the last 8760 hours. HbA1C: No results for input(s): HGBA1C in the last 72 hours. CBG: No results for input(s): GLUCAP in the last 168 hours. Lipid Profile: No results for input(s): CHOL, HDL, LDLCALC, TRIG, CHOLHDL, LDLDIRECT in  the last 72 hours. Thyroid Function Tests: No results for input(s): TSH, T4TOTAL, FREET4, T3FREE, THYROIDAB in the last 72 hours. Anemia Panel: No results for input(s): VITAMINB12, FOLATE, FERRITIN, TIBC, IRON, RETICCTPCT in the last 72 hours. Sepsis Labs: Recent Labs  Lab 05/30/20 2045 05/30/20 2250 05/31/20 0206 05/31/20 0429 06/01/20 0547  PROCALCITON  --   --  0.75   0.73  --  0.55  LATICACIDVEN 3.9* 2.5* 1.3 1.0  --     Recent Results (from the past 240 hour(s))  Blood culture (routine x 2)     Status: None (Preliminary result)   Collection Time: 05/30/20  8:45 PM   Specimen: BLOOD LEFT FOREARM  Result Value Ref Range Status   Specimen Description BLOOD LEFT FOREARM  Final   Special Requests   Final    BOTTLES DRAWN AEROBIC  AND ANAEROBIC Blood Culture adequate volume   Culture   Final    NO GROWTH 2 DAYS Performed at [facility], [address] [place], [place] [address]    Report Status PENDING  Incomplete  Blood culture (routine x 2)     Status: None (Preliminary result)   Collection Time: 05/30/20  8:50 PM   Specimen: Left Antecubital; Blood  Result Value Ref Range Status   Specimen Description LEFT ANTECUBITAL  Final   Special Requests   Final    BOTTLES DRAWN AEROBIC AND ANAEROBIC Blood Culture adequate volume   Culture   Final    NO GROWTH 2 DAYS Performed at [facility], [address], [place], [place] [address]    Report Status PENDING  Incomplete  SARS Coronavirus 2 by RT PCR (hospital order, performed in [facility] hospital lab) Nasopharyngeal Nasopharyngeal Swab     Status: None   Collection Time: 05/30/20  8:55 PM   Specimen: Nasopharyngeal Swab  Result Value Ref Range Status   SARS Coronavirus 2 NEGATIVE NEGATIVE Final    Comment: (NOTE) SARS-CoV-2 target nucleic acids are NOT DETECTED.  The SARS-CoV-2 RNA is generally detectable in upper and lower respiratory specimens during the acute phase of infection. The lowest concentration of SARS-CoV-2 viral copies this assay can detect is 250 copies / mL. A negative result does not preclude SARS-CoV-2 infection and should not be used as the sole basis for treatment or other patient management decisions.  A negative result may occur with improper specimen collection / handling, submission of specimen other than nasopharyngeal swab, presence of viral mutation(s) within the areas targeted by this assay, and inadequate number of viral copies (<250 copies / mL). A negative result must be combined with clinical observations, patient history, and epidemiological information.  Fact Sheet for Patients:   StrictlyIdeas.no  Fact Sheet for Healthcare  Providers: BankingDealers.co.za  This test is not yet approved or  cleared by the [place] FDA and has been authorized for detection and/or diagnosis of SARS-CoV-2 by FDA under an Emergency Use Authorization (EUA).  This EUA will remain in effect (meaning this test can be used) for the duration of the COVID-19 declaration under Section 564(b)(1) of the Act, 21 U.S.C. section 360bbb-3(b)(1), unless the authorization is terminated or revoked sooner.  Performed at [facility], [address] [place], [place] [address]   MRSA PCR Screening     Status: None   Collection Time: 06/01/20 11:19 AM   Specimen: Nasopharyngeal  Result Value Ref Range Status   MRSA by PCR NEGATIVE NEGATIVE Final    Comment:  The GeneXpert MRSA Assay (FDA approved for NASAL specimens only), is one component of a comprehensive MRSA colonization surveillance program. It is not intended to diagnose MRSA infection nor to guide or monitor treatment for MRSA infections. Performed at [facility], [address] [place], [place] [address]          Radiology Studies: DG Chest Portable 1 View  Result Date: 05/30/2020 CLINICAL DATA:  Shortness of breath EXAM: PORTABLE CHEST 1 VIEW COMPARISON:  09/19/2018 FINDINGS: Recording device over the left chest. No focal opacity or pleural effusion. Stable cardiomediastinal silhouette. No pneumothorax. IMPRESSION: No active disease. Electronically Signed   By: Donavan Foil M.D.   On: 05/30/2020 21:15        Scheduled Meds:  azithromycin  500 mg Oral Daily   cholecalciferol  1,000 Units Oral Daily   ciprofloxacin  1 drop Both Eyes Q4H while awake   guaiFENesin  600 mg Oral BID   memantine  14 mg Oral Daily   vitamin B-12  500 mcg Oral Daily   Continuous Infusions:  sodium chloride 100 mL/hr at 06/01/20 0956   cefTRIAXone (ROCEPHIN)  IV      Assessment & Plan:   Active Problems:   Sepsis  ([place])   1.Suspected sepsis with significant leukocytosis, elevated lactic acid, tachypnea and generalized weakness and acute kidney injury.  The patient has reported dyspnea as well.  The etiology is not clear but I suspect respiratory etiology could be related to early pneumonia.  His urinalysis was mostly unremarkable with only 6-10 WBCs on microscopic exam. Dc vanco and flagyl as there is no role for them Continue ceftriaxone and azithromycin Continue following  procalcitonin Lactic acid normal Continue IV fluid as patient is hypotensive  2.  Acute kidney injury. -I suspect prerenal etiology and it could be related to sepsis Mildly improved with IV fluids we will check a.m. labs  3.  Dementia.  continue Namenda and vitamin B12.    DVT prophylaxis: DC Lovenox due to decreasing platelets, SCD Code Status: Full Family Communication: Wife at bedside Disposition Plan: Back to home life Status is: Inpatient  Remains inpatient appropriate because:IV treatments appropriate due to intensity of illness or inability to take PO   Dispo: The patient is from: Home              Anticipated d/c is to: Home              Anticipated d/c date is: 1 day              Patient currently is not medically stable to d/c.pts is hypotensive. Need ivf            LOS: 2 days   Time spent: 35 minutes with more than 50% on [place], MD [facility] Pager [phone]  If 7PM-7AM, please contact night-coverage [email or web address] Password [facility] 06/01/2020, 3:15 PM

## 2020-06-02 ENCOUNTER — Telehealth: Payer: Self-pay

## 2020-06-02 LAB — CBC
HCT: 42.1 % (ref 39.0–52.0)
Hemoglobin: 14.5 g/dL (ref 13.0–17.0)
MCH: 32.7 pg (ref 26.0–34.0)
MCHC: 34.4 g/dL (ref 30.0–36.0)
MCV: 94.8 fL (ref 80.0–100.0)
Platelets: 100 10*3/uL — ABNORMAL LOW (ref 150–400)
RBC: 4.44 MIL/uL (ref 4.22–5.81)
RDW: 12.8 % (ref 11.5–15.5)
WBC: 6.1 10*3/uL (ref 4.0–10.5)
nRBC: 0 % (ref 0.0–0.2)

## 2020-06-02 LAB — BASIC METABOLIC PANEL
Anion gap: 6 (ref 5–15)
BUN: 11 mg/dL (ref 8–23)
CO2: 26 mmol/L (ref 22–32)
Calcium: 8.2 mg/dL — ABNORMAL LOW (ref 8.9–10.3)
Chloride: 110 mmol/L (ref 98–111)
Creatinine, Ser: 1.26 mg/dL — ABNORMAL HIGH (ref 0.61–1.24)
GFR calc Af Amer: >60 mL/min (ref >60)
GFR calc non Af Amer: 52 mL/min — ABNORMAL LOW (ref >60)
Glucose, Bld: 99 mg/dL (ref 70–99)
Potassium: 4.2 mmol/L (ref 3.5–5.1)
Sodium: 142 mmol/L (ref 135–145)

## 2020-06-02 MED ORDER — AMOXICILLIN-POT CLAVULANATE 875-125 MG PO TABS
1.0000 | ORAL_TABLET | Freq: Two times a day (BID) | ORAL | 0 refills | Status: AC
Start: 2020-06-02 — End: 2020-06-07

## 2020-06-02 MED ORDER — GUAIFENESIN ER 600 MG PO TB12: 600.0000 mg | ORAL_TABLET | Freq: Two times a day (BID) | ORAL | 0 refills | Status: AC

## 2020-06-02 MED ORDER — CIPROFLOXACIN HCL 0.3 % OP SOLN: 1.0000 [drp] | OPHTHALMIC | 0 refills | Status: AC

## 2020-06-02 MED ORDER — MIDODRINE HCL 5 MG PO TABS
2.5000 mg | ORAL_TABLET | Freq: Three times a day (TID) | ORAL | Status: DC
  Administered 2020-06-02: 2.5 mg via ORAL
  Filled 2020-06-02: qty 1

## 2020-06-02 MED ORDER — MIDODRINE HCL 2.5 MG PO TABS: 2.5000 mg | ORAL_TABLET | Freq: Three times a day (TID) | ORAL | 0 refills | Status: DC

## 2020-06-02 NOTE — Progress Notes (Signed)
AVS gone over with patient and spouse. All questions answered

## 2020-06-02 NOTE — Care Management Important Message (Signed)
Important Message  Patient Details  Name: WILLOW RECZEK MRN: [id number] Date of Birth: 12/10/1933   Medicare Important Message Given:  Yes     Juliann Pulse A Tikia Skilton 06/02/2020, 10:49 AM

## 2020-06-02 NOTE — Telephone Encounter (Signed)
1st attempt- Left message on voicemail to return call. Need to complete TCM and schedule hospital follow up visit.

## 2020-06-02 NOTE — Discharge Summary (Signed)
QUILL GRINDER GXQ:[id number] DOB: 11-15-1933 DOA: 05/30/2020  PCP: [name], MD  Admit date: 05/30/2020 Discharge date: 06/02/2020  Admitted From: Home Disposition: Home  Recommendations for Outpatient Follow-up:  1. Follow up with PCP in 1 week 2. Please obtain BMP/CBC in one week     Discharge Condition:Stable CODE STATUS: Full Diet recommendation: Heart Healthy   Brief/Interim Summary: Dylan [name]  is a 86 y.o. male with a known history of dementia and dyslipidemia, who presented to the emergency room with acute onset of worsening generalized weakness since yesterday with associated mild dyspnea that was noted by his family.  He did not have any reported cough or wheezing however he was slightly tachypneic in the ER.  1.Suspected sepsis with significant leukocytosis, elevated lactic acid, tachypnea and generalized weakness and acute kidney injury. The patient has reported dyspnea as well. The etiology is not clear but I suspect respiratory etiology could be related to early pneumonia. His urinalysis was mostly unremarkable with only 6-10 WBCs on microscopic exam. Dc vanco and flagyl as there is no role for them Continued on  ceftriaxone and azithromycin...>will d/c to po abx to complete the course Lactic acid normal Add midrodrine for low bp    2. Acute kidney injury. Prerenal from sepsis and decrease po intake.  Improved . Encouraged po intake.   3. Dementia.  continue Namenda and vitamin B12.    Discharge Diagnoses:  Active Problems:   Sepsis [facility])    Discharge Instructions  Discharge Instructions    Call MD for:  temperature >100.4   Complete by: As directed    Diet - low sodium heart healthy   Complete by: As directed    Increase activity slowly   Complete by: As directed      Allergies as of 06/02/2020      Reactions   Doxepin Other (See Comments)   Lethargy, overly sedating at low dose   Lincomycin Hcl Rash      Medication List     TAKE these medications   amoxicillin-clavulanate 875-125 MG tablet Commonly known as: Augmentin Take 1 tablet by mouth every 12 (twelve) hours for 5 days.   ciprofloxacin 0.3 % ophthalmic solution Commonly known as: CILOXAN Place 1 drop into both eyes every 4 (four) hours while awake for 4 days. Administer 1 drop, every 2 hours, while awake, for 2 days. Then 1 drop, every 4 hours, while awake, for the next 5 days.   guaiFENesin 600 MG 12 hr tablet Commonly known as: MUCINEX Take 1 tablet (600 mg total) by mouth 2 (two) times daily for 7 days.   memantine 14 MG Cp24 24 hr capsule Commonly known as: NAMENDA XR Take 1 capsule (14 mg total) by mouth daily.   midodrine 2.5 MG tablet Commonly known as: PROAMATINE Take 1 tablet (2.5 mg total) by mouth 3 (three) times daily with meals.   vitamin B-12 500 MCG tablet Commonly known as: V-R VITAMIN B-12 Take 1 tablet (500 mcg total) by mouth daily.   Vitamin D3 25 MCG (1000 UT) Caps Take 1 capsule (1,000 Units total) by mouth daily.       Follow-up Information    [name], MD On 06/06/2020.   Specialty: Family Medicine Why: @ 11:30 am Contact information: [place] [id number] [phone]              Allergies  Allergen Reactions  . Doxepin Other (See Comments)    Lethargy, overly sedating at  low dose  . Lincomycin Hcl Rash    Consultations:     Procedures/Studies: DG Chest Portable 1 View  Result Date: 05/30/2020 CLINICAL DATA:  Shortness of breath EXAM: PORTABLE CHEST 1 VIEW COMPARISON:  09/19/2018 FINDINGS: Recording device over the left chest. No focal opacity or pleural effusion. Stable cardiomediastinal silhouette. No pneumothorax. IMPRESSION: No active disease. Electronically Signed   By: Donavan Foil M.D.   On: 05/30/2020 21:15   CUP PACEART REMOTE DEVICE CHECK  Result Date: 05/29/2020 Carelink summary report received. Battery status OK. Normal device function. No new  symptom episodes, brady, or pause episodes. No new AF episodes. One new tachy episode x 7 seconds, NCT, rate 158 bpm; known SVT. Per previous note, pt to notify if symptomatic. Monthly summary reports and ROV/PRN    AManley      Subjective: No complaints. Wife at bedside  Discharge Exam: Vitals:   06/02/20 0017 06/02/20 0730  BP:  (!) 103/54  Pulse: 87 70  Resp: 17 20  Temp:  98.3 F (36.8 C)  SpO2: 96% 95%   Vitals:   06/01/20 0740 06/01/20 1519 06/02/20 0017 06/02/20 0730  BP: 112/78 (!) 107/64  (!) 103/54  Pulse: 77 52 87 70  Resp: 17 18 17 20   Temp: (!) 97.5 F (36.4 C) (!) 97.3 F (36.3 C)  98.3 F (36.8 C)  TempSrc:  Oral  Oral  SpO2: 98% 95% 96% 95%  Weight:      Height:        General: Pt is alert, awake, not in acute distress Cardiovascular: RRR, S1/S2 +, no rubs, no gallops Respiratory: CTA bilaterally, no wheezing, no rhonchi Abdominal: Soft, NT, ND, bowel sounds + Extremities: no edema, no cyanosis    The results of significant diagnostics from this hospitalization (including imaging, microbiology, ancillary and laboratory) are listed below for reference.     Microbiology: Recent Results (from the past 240 hour(s))  Blood culture (routine x 2)     Status: None (Preliminary result)   Collection Time: 05/30/20  8:45 PM   Specimen: BLOOD LEFT FOREARM  Result Value Ref Range Status   Specimen Description BLOOD LEFT FOREARM  Final   Special Requests   Final    BOTTLES DRAWN AEROBIC AND ANAEROBIC Blood Culture adequate volume   Culture   Final    NO GROWTH 3 DAYS Performed at [facility], [address] [place], [place] [address]    Report Status PENDING  Incomplete  Blood culture (routine x 2)     Status: None (Preliminary result)   Collection Time: 05/30/20  8:50 PM   Specimen: Left Antecubital; Blood  Result Value Ref Range Status   Specimen Description LEFT ANTECUBITAL  Final   Special Requests   Final    BOTTLES DRAWN AEROBIC AND  ANAEROBIC Blood Culture adequate volume   Culture   Final    NO GROWTH 3 DAYS Performed at [facility], [address], [place], [place] [address]    Report Status PENDING  Incomplete  SARS Coronavirus 2 by RT PCR (hospital order, performed in [place] hospital lab) Nasopharyngeal Nasopharyngeal Swab     Status: None   Collection Time: 05/30/20  8:55 PM   Specimen: Nasopharyngeal Swab  Result Value Ref Range Status   SARS Coronavirus 2 NEGATIVE NEGATIVE Final    Comment: (NOTE) SARS-CoV-2 target nucleic acids are NOT DETECTED.  The SARS-CoV-2 RNA is generally detectable in upper and lower respiratory specimens during the acute  phase of infection. The lowest concentration of SARS-CoV-2 viral copies this assay can detect is 250 copies / mL. A negative result does not preclude SARS-CoV-2 infection and should not be used as the sole basis for treatment or other patient management decisions.  A negative result may occur with improper specimen collection / handling, submission of specimen other than nasopharyngeal swab, presence of viral mutation(s) within the areas targeted by this assay, and inadequate number of viral copies (<250 copies / mL). A negative result must be combined with clinical observations, patient history, and epidemiological information.  Fact Sheet for Patients:   StrictlyIdeas.no  Fact Sheet for Healthcare Providers: BankingDealers.co.za  This test is not yet approved or  cleared by the [place] FDA and has been authorized for detection and/or diagnosis of SARS-CoV-2 by FDA under an Emergency Use Authorization (EUA).  This EUA will remain in effect (meaning this test can be used) for the duration of the COVID-19 declaration under Section 564(b)(1) of the Act, 21 U.S.C. section 360bbb-3(b)(1), unless the authorization is terminated or revoked sooner.  Performed at [facility], [address] [place], [place] [address]   MRSA PCR Screening     Status: None   Collection Time: 06/01/20 11:19 AM   Specimen: Nasopharyngeal  Result Value Ref Range Status   MRSA by PCR NEGATIVE NEGATIVE Final    Comment:        The GeneXpert MRSA Assay (FDA approved for NASAL specimens only), is one component of a comprehensive MRSA colonization surveillance program. It is not intended to diagnose MRSA infection nor to guide or monitor treatment for MRSA infections. Performed at [facility], [address] [place], [place] [address]      Labs: BNP (last 3 results) No results for input(s): BNP in the last 8760 hours. Basic Metabolic Panel: Recent Labs  Lab 05/30/20 1622 05/31/20 0206 06/01/20 0547 06/02/20 0433  NA 135 136  --  142  K 4.0 3.7  --  4.2  CL 100 104  --  110  CO2 24 24  --  26  GLUCOSE 158* 100*  --  99  BUN 19 22  --  11  CREATININE 1.59* 1.50* 1.42* 1.26*  CALCIUM 9.2 8.2*  --  8.2*   Liver Function Tests: No results for input(s): AST, ALT, ALKPHOS, BILITOT, PROT, ALBUMIN in the last 168 hours. No results for input(s): LIPASE, AMYLASE in the last 168 hours. No results for input(s): AMMONIA in the last 168 hours. CBC: Recent Labs  Lab 05/30/20 1622 05/31/20 0206 06/01/20 0547 06/02/20 0433  WBC 23.1* 16.5* 7.9 6.1  HGB 15.3 13.0 13.4 14.5  HCT 43.7 37.8* 38.6* 42.1  MCV 92.8 94.0 94.4 94.8  PLT 114* 95* 89* 100*   Cardiac Enzymes: Recent Labs  Lab 05/31/20 0206  CKTOTAL 234   BNP: Invalid input(s): POCBNP CBG: No results for input(s): GLUCAP in the last 168 hours. D-Dimer No results for input(s): DDIMER in the last 72 hours. Hgb A1c No results for input(s): HGBA1C in the last 72 hours. Lipid Profile No results for input(s): CHOL, HDL, LDLCALC, TRIG, CHOLHDL, LDLDIRECT in the last 72 hours. Thyroid function studies No results for input(s): TSH, T4TOTAL, T3FREE, THYROIDAB in the last 72 hours.  Invalid input(s):  FREET3 Anemia work up No results for input(s): VITAMINB12, FOLATE, FERRITIN, TIBC, IRON, RETICCTPCT in the last 72 hours. Urinalysis    Component Value Date/Time   COLORURINE AMBER (A) 05/30/2020 2305   APPEARANCEUR  HAZY (A) 05/30/2020 2305   LABSPEC 1.030 05/30/2020 2305   PHURINE 5.0 05/30/2020 2305   GLUCOSEU NEGATIVE 05/30/2020 2305   HGBUR NEGATIVE 05/30/2020 2305   BILIRUBINUR NEGATIVE 05/30/2020 2305   KETONESUR 5 (A) 05/30/2020 2305   PROTEINUR 100 (A) 05/30/2020 2305   NITRITE NEGATIVE 05/30/2020 2305   LEUKOCYTESUR NEGATIVE 05/30/2020 2305   Sepsis Labs Invalid input(s): PROCALCITONIN,  WBC,  LACTICIDVEN Microbiology Recent Results (from the past 240 hour(s))  Blood culture (routine x 2)     Status: None (Preliminary result)   Collection Time: 05/30/20  8:45 PM   Specimen: BLOOD LEFT FOREARM  Result Value Ref Range Status   Specimen Description BLOOD LEFT FOREARM  Final   Special Requests   Final    BOTTLES DRAWN AEROBIC AND ANAEROBIC Blood Culture adequate volume   Culture   Final    NO GROWTH 3 DAYS Performed at [facility], [address] [place], [place] [address]    Report Status PENDING  Incomplete  Blood culture (routine x 2)     Status: None (Preliminary result)   Collection Time: 05/30/20  8:50 PM   Specimen: Left Antecubital; Blood  Result Value Ref Range Status   Specimen Description LEFT ANTECUBITAL  Final   Special Requests   Final    BOTTLES DRAWN AEROBIC AND ANAEROBIC Blood Culture adequate volume   Culture   Final    NO GROWTH 3 DAYS Performed at [facility], [address] [place], [place] [address]    Report Status PENDING  Incomplete  SARS Coronavirus 2 by RT PCR (hospital order, performed in [facility] hospital lab) Nasopharyngeal Nasopharyngeal Swab     Status: None   Collection Time: 05/30/20  8:55 PM   Specimen: Nasopharyngeal Swab  Result Value Ref Range Status   SARS Coronavirus 2 NEGATIVE NEGATIVE Final     Comment: (NOTE) SARS-CoV-2 target nucleic acids are NOT DETECTED.  The SARS-CoV-2 RNA is generally detectable in upper and lower respiratory specimens during the acute phase of infection. The lowest concentration of SARS-CoV-2 viral copies this assay can detect is 250 copies / mL. A negative result does not preclude SARS-CoV-2 infection and should not be used as the sole basis for treatment or other patient management decisions.  A negative result may occur with improper specimen collection / handling, submission of specimen other than nasopharyngeal swab, presence of viral mutation(s) within the areas targeted by this assay, and inadequate number of viral copies (<250 copies / mL). A negative result must be combined with clinical observations, patient history, and epidemiological information.  Fact Sheet for Patients:   StrictlyIdeas.no  Fact Sheet for Healthcare Providers: BankingDealers.co.za  This test is not yet approved or  cleared by the [place] FDA and has been authorized for detection and/or diagnosis of SARS-CoV-2 by FDA under an Emergency Use Authorization (EUA).  This EUA will remain in effect (meaning this test can be used) for the duration of the COVID-19 declaration under Section 564(b)(1) of the Act, 21 U.S.C. section 360bbb-3(b)(1), unless the authorization is terminated or revoked sooner.  Performed at [facility], [address] [place], [place] [address]   MRSA PCR Screening     Status: None   Collection Time: 06/01/20 11:19 AM   Specimen: Nasopharyngeal  Result Value Ref Range Status   MRSA by PCR NEGATIVE NEGATIVE Final    Comment:        The GeneXpert MRSA Assay (FDA approved for NASAL specimens  only), is one component of a comprehensive MRSA colonization surveillance program. It is not intended to diagnose MRSA infection nor to guide or monitor treatment for MRSA  infections. Performed at [facility], [address] [place], [place] [address]      Time coordinating discharge: Over 30 minutes  SIGNED:   Nolberto Hanlon, MD  [facility] 06/02/2020, 3:56 PM Pager   If 7PM-7AM, please contact night-coverage [email or web address] Password [email or web address]

## 2020-06-04 LAB — CULTURE, BLOOD (ROUTINE X 2)
Culture: NO GROWTH
Culture: NO GROWTH
Special Requests: ADEQUATE
Special Requests: ADEQUATE

## 2020-06-05 LAB — GLUCOSE, CAPILLARY: Glucose-Capillary: 142 mg/dL — ABNORMAL HIGH (ref 70–99)

## 2020-06-05 NOTE — Telephone Encounter (Signed)
Transition Care Management Follow-up Telephone Call  Date of discharge and from where: 06/02/2020, [facility]  How have you been since you were released from the hospital? Patient states he is doing fine. No complaints at this time.   Any questions or concerns? No   Items Reviewed:  Did the pt receive and understand the discharge instructions provided? Yes   Medications obtained and verified? Yes   Any new allergies since your discharge? No   Dietary orders reviewed? Yes  Do you have support at home? Yes   Functional Questionnaire: (I = Independent and D = Dependent) ADLs: I  Bathing/Dressing- I  Meal Prep- I  Eating- I  Maintaining continence- I  Transferring/Ambulation- I  Managing Meds- I  Follow up appointments reviewed:   PCP Hospital f/u appt confirmed? Yes  Scheduled to see Dr. Danise Mina on 06/06/2020 @ 11:30 am.  [facility] f/u appt confirmed? N/A   Are transportation arrangements needed? No   If their condition worsens, is the pt aware to call PCP or go to the Emergency Dept.? Yes  Was the patient provided with contact information for the PCP's office or ED? Yes  Was to pt encouraged to call back with questions or concerns? Yes

## 2020-06-06 ENCOUNTER — Other Ambulatory Visit: Payer: Self-pay

## 2020-06-06 ENCOUNTER — Encounter: Payer: Self-pay | Admitting: Family Medicine

## 2020-06-06 ENCOUNTER — Ambulatory Visit (INDEPENDENT_AMBULATORY_CARE_PROVIDER_SITE_OTHER): Payer: Medicare Other | Admitting: Family Medicine

## 2020-06-06 VITALS — BP 102/68 | HR 67 | Temp 97.4°F | Ht 69.0 in | Wt 191.6 lb

## 2020-06-06 DIAGNOSIS — E538 Deficiency of other specified B group vitamins: Secondary | ICD-10-CM

## 2020-06-06 DIAGNOSIS — I739 Peripheral vascular disease, unspecified: Secondary | ICD-10-CM

## 2020-06-06 DIAGNOSIS — F0390 Unspecified dementia without behavioral disturbance: Secondary | ICD-10-CM

## 2020-06-06 DIAGNOSIS — D696 Thrombocytopenia, unspecified: Secondary | ICD-10-CM

## 2020-06-06 DIAGNOSIS — E559 Vitamin D deficiency, unspecified: Secondary | ICD-10-CM

## 2020-06-06 DIAGNOSIS — N1831 Chronic kidney disease, stage 3a: Secondary | ICD-10-CM

## 2020-06-06 DIAGNOSIS — I452 Bifascicular block: Secondary | ICD-10-CM

## 2020-06-06 DIAGNOSIS — E785 Hyperlipidemia, unspecified: Secondary | ICD-10-CM

## 2020-06-06 DIAGNOSIS — A419 Sepsis, unspecified organism: Secondary | ICD-10-CM | POA: Diagnosis not present

## 2020-06-06 LAB — RENAL FUNCTION PANEL
Albumin: 4.1 g/dL (ref 3.5–5.2)
BUN: 10 mg/dL (ref 6–23)
CO2: 31 mEq/L (ref 19–32)
Calcium: 10.1 mg/dL (ref 8.4–10.5)
Chloride: 102 mEq/L (ref 96–112)
Creatinine, Ser: 1.34 mg/dL (ref 0.40–1.50)
GFR: 50.75 mL/min — ABNORMAL LOW (ref >60.00)
Glucose, Bld: 88 mg/dL (ref 70–99)
Phosphorus: 2.8 mg/dL (ref 2.3–4.6)
Potassium: 4.6 mEq/L (ref 3.5–5.1)
Sodium: 138 mEq/L (ref 135–145)

## 2020-06-06 LAB — CBC WITH DIFFERENTIAL/PLATELET
Basophils Absolute: 0.1 10*3/uL (ref 0.0–0.1)
Basophils Relative: 1 % (ref 0.0–3.0)
Eosinophils Absolute: 0.1 10*3/uL (ref 0.0–0.7)
Eosinophils Relative: 0.9 % (ref 0.0–5.0)
HCT: 46.6 % (ref 39.0–52.0)
Hemoglobin: 16 g/dL (ref 13.0–17.0)
Lymphocytes Relative: 21.9 % (ref 12.0–46.0)
Lymphs Abs: 1.5 10*3/uL (ref 0.7–4.0)
MCHC: 34.4 g/dL (ref 30.0–36.0)
MCV: 94.9 fl (ref 78.0–100.0)
Monocytes Absolute: 0.6 10*3/uL (ref 0.1–1.0)
Monocytes Relative: 9.5 % (ref 3.0–12.0)
Neutro Abs: 4.6 10*3/uL (ref 1.4–7.7)
Neutrophils Relative %: 66.7 % (ref 43.0–77.0)
Platelets: 136 10*3/uL — ABNORMAL LOW (ref 150.0–400.0)
RBC: 4.91 Mil/uL (ref 4.22–5.81)
RDW: 13.4 % (ref 11.5–15.5)
WBC: 6.8 10*3/uL (ref 4.0–10.5)

## 2020-06-06 NOTE — Patient Instructions (Addendum)
Call Dr Curt Bears to see when follow up is due/recommended.  Finish antibiotic.  Labs today.  Ok to continue mucinex as needed. I'm glad you're feeling some better.

## 2020-06-06 NOTE — Progress Notes (Signed)
This visit was conducted in person.  BP 102/68 (BP Location: Left Arm, Patient Position: Sitting, Cuff Size: Normal)   Pulse 67   Temp (!) 97.4 F (36.3 C) (Temporal)   Ht 5\' 9"  (1.753 m)   Wt 191 lb 9.6 oz (86.9 kg)   SpO2 96%   BMI 28.29 kg/m    CC: hosp f/u visit  Subjective:    Patient ID: Dylan [name], male    DOB: 1934/09/30, 86 y.o.   MRN: [id number]  HPI: AZIZ SLAPE is a 86 y.o. male presenting on 06/06/2020 for Hospitalization Follow-up (Seen in [facility] for weakness. Concerns with EKG results. )   Recent hospitalization for acute worsening of generalized weakness associated with dyspnea. Found to have sepsis with elevated WBC, lactic acid, AKI. Chest xray showed possible early pneumonia. Treated with ceftriaxone and azithromycin, discharged on oral augmentin. Initially started on vanc/flagyl but this was quickly discontinued and replaced by above. Blcx x2 negative. Midodrine was started for low blood pressures noted.   Since home having some PNDrainage and cough but no fever. Mucinex helps these symptoms.   EKG in hospital showed PACs along with RBBB with LAFB - chronic finding, previously sent to cardiology for this.    Admit date: 05/30/2020 Discharge date: 06/02/2020 TCM hosp f/u phone call completed 06/05/2020  Admitted From: Home Disposition: Home  Recommendations for Outpatient Follow-up:  1. Follow up with PCP in 1 week 2. Please obtain BMP/CBC in one week  Discharge Condition:Stable CODE STATUS: Full Diet recommendation: Heart Healthy   Discharge Diagnoses:  Active Problems:   Sepsis ([place])     Relevant past medical, surgical, family and social history reviewed and updated as indicated. Interim medical history since our last visit reviewed. Allergies and medications reviewed and updated. Outpatient Medications Prior to Visit  Medication Sig Dispense Refill  . amoxicillin-clavulanate (AUGMENTIN) 875-125 MG tablet Take 1 tablet by mouth every 12  (twelve) hours for 5 days. 10 tablet 0  . Cholecalciferol (VITAMIN D3) 1000 units CAPS Take 1 capsule (1,000 Units total) by mouth daily. 30 capsule   . ciprofloxacin (CILOXAN) 0.3 % ophthalmic solution Place 1 drop into both eyes every 4 (four) hours while awake for 4 days. Administer 1 drop, every 2 hours, while awake, for 2 days. Then 1 drop, every 4 hours, while awake, for the next 5 days. 1 mL 0  . guaiFENesin (MUCINEX) 600 MG 12 hr tablet Take 1 tablet (600 mg total) by mouth 2 (two) times daily for 7 days. 14 tablet 0  . memantine (NAMENDA XR) 14 MG CP24 24 hr capsule Take 1 capsule (14 mg total) by mouth daily. 90 capsule 3  . midodrine (PROAMATINE) 2.5 MG tablet Take 1 tablet (2.5 mg total) by mouth 3 (three) times daily with meals. 90 tablet 0  . vitamin B-12 (V-R VITAMIN B-12) 500 MCG tablet Take 1 tablet (500 mcg total) by mouth daily.     No facility-administered medications prior to visit.     Per HPI unless specifically indicated in ROS section below Review of Systems Objective:  BP 102/68 (BP Location: Left Arm, Patient Position: Sitting, Cuff Size: Normal)   Pulse 67   Temp (!) 97.4 F (36.3 C) (Temporal)   Ht 5\' 9"  (1.753 m)   Wt 191 lb 9.6 oz (86.9 kg)   SpO2 96%   BMI 28.29 kg/m   Wt Readings from Last 3 Encounters:  06/06/20 191 lb 9.6 oz (86.9 kg)  05/30/20 190  lb (86.2 kg)  03/28/20 197 lb 3.2 oz (89.4 kg)      Physical Exam Vitals and nursing note reviewed.  Constitutional:      Appearance: Normal appearance. He is not ill-appearing.  HENT:     Head: Normocephalic and atraumatic.  Eyes:     Extraocular Movements: Extraocular movements intact.     Pupils: Pupils are equal, round, and reactive to light.  Cardiovascular:     Rate and Rhythm: Normal rate and regular rhythm.     Pulses: Normal pulses.     Heart sounds: Normal heart sounds. No murmur heard.   Pulmonary:     Effort: Pulmonary effort is normal. No respiratory distress.     Breath sounds:  Normal breath sounds. No wheezing, rhonchi or rales.  Musculoskeletal:     Right lower leg: No edema.     Left lower leg: No edema.  Skin:    General: Skin is warm and dry.     Findings: No rash.  Neurological:     Mental Status: He is alert.  Psychiatric:        Mood and Affect: Mood normal.        Behavior: Behavior normal.       Results for orders placed or performed in visit on 06/06/20  CBC with Differential/Platelet  Result Value Ref Range   WBC 6.8 4.0 - 10.5 K/uL   RBC 4.91 4.22 - 5.81 Mil/uL   Hemoglobin 16.0 13.0 - 17.0 g/dL   HCT 46.6 39 - 52 %   MCV 94.9 78.0 - 100.0 fl   MCHC 34.4 30.0 - 36.0 g/dL   RDW 13.4 11.5 - 15.5 %   Platelets 136.0 (L) 150 - 400 K/uL   Neutrophils Relative % 66.7 43 - 77 %   Lymphocytes Relative 21.9 12 - 46 %   Monocytes Relative 9.5 3 - 12 %   Eosinophils Relative 0.9 0 - 5 %   Basophils Relative 1.0 0 - 3 %   Neutro Abs 4.6 1.4 - 7.7 K/uL   Lymphs Abs 1.5 0.7 - 4.0 K/uL   Monocytes Absolute 0.6 0 - 1 K/uL   Eosinophils Absolute 0.1 0 - 0 K/uL   Basophils Absolute 0.1 0 - 0 K/uL  Renal function panel  Result Value Ref Range   Sodium 138 135 - 145 mEq/L   Potassium 4.6 3.5 - 5.1 mEq/L   Chloride 102 96 - 112 mEq/L   CO2 31 19 - 32 mEq/L   Albumin 4.1 3.5 - 5.2 g/dL   BUN 10 6 - 23 mg/dL   Creatinine, Ser 1.34 0.40 - 1.50 mg/dL   Glucose, Bld 88 70 - 99 mg/dL   Phosphorus 2.8 2.3 - 4.6 mg/dL   GFR 50.75 (L) >60.00 mL/min   Calcium 10.1 8.4 - 10.5 mg/dL   Lab Results  Component Value Date   CHOL 175 08/04/2019   HDL 34.60 (L) 08/04/2019   LDLCALC 113 (H) 08/04/2019   LDLDIRECT 117.0 07/29/2018   TRIG 138.0 08/04/2019   CHOLHDL 5 08/04/2019    Assessment & Plan:  This visit occurred during the SARS-CoV-2 public health emergency.  Safety protocols were in place, including screening questions prior to the visit, additional usage of staff PPE, and extensive cleaning of exam room while observing appropriate contact time as  indicated for disinfecting solutions.   Problem List Items Addressed This Visit    Vitamin D deficiency    Continue vit D replacement.  Vitamin B12 deficiency    Continues oral replacement       Thrombocytopenia (HCC)    Update levels. Stable period without easy bruising/bleeding.       Sepsis ([place]) - Primary    Presumed from respiratory source - treated with azithromycin and rocephin discharged on augmentin and finishing course. Sepsis has now resolved. Started on midodrine during hospitalization.  Discussed need for pneumococcal vaccine at f/u appt.       Relevant Orders   CBC with Differential/Platelet (Completed)   Renal function panel (Completed)   Right bundle branch block with left anterior fascicular block    H/o bifascicular block with loop recorder in place.  I encouraged they call EP to see when future appt due.       PAD (peripheral artery disease) ([place])    Had PAD evaluation 03/2020 - no indication at this time for invasive studies, further eval/treatment besides risk factor modification       Dyslipidemia    Chronic, not on statin.       Dementia ([place])    Progressive over 18 months now on namenda XR 14mg  daily. Does not have typical features of LBD or PD. Wife is his caregiver. labwork earlier this year reassuring (RPR, B12, TSH, etc)  Will proceed with head imaging (has not had done yet)  (had head CT 2019 from OSH after fall with head laceration, I don't have records of this)      Relevant Orders   CT Head Wo Contrast   CKD (chronic kidney disease) stage 3, GFR 30-59 ml/min    Update labs.           No orders of the defined types were placed in this encounter.  Orders Placed This Encounter  Procedures  . CT Head Wo Contrast    Standing Status:   Future    Standing Expiration Date:   06/10/2021    Order Specific Question:   Preferred imaging location?    Answer:   [facility]    Order Specific Question:   Radiology Contrast  Protocol - do NOT remove file path    Answer:   [email or web address]  . CBC with Differential/Platelet  . Renal function panel    Patient Instructions  Call Dr Curt Bears to see when follow up is due/recommended.  Finish antibiotic.  Labs today.  Ok to continue mucinex as needed. I'm glad you're feeling some better.   Follow up plan: Return if symptoms worsen or fail to improve.  [name], MD

## 2020-06-10 ENCOUNTER — Encounter: Payer: Self-pay | Admitting: Family Medicine

## 2020-06-10 ENCOUNTER — Telehealth: Payer: Self-pay | Admitting: Family Medicine

## 2020-06-10 NOTE — Assessment & Plan Note (Addendum)
Presumed from respiratory source - treated with azithromycin and rocephin discharged on augmentin and finishing course. Sepsis has now resolved. Started on midodrine during hospitalization.  Discussed need for pneumococcal vaccine at f/u appt.

## 2020-06-10 NOTE — Assessment & Plan Note (Addendum)
Progressive over 18 months now on namenda XR 14mg  daily. Does not have typical features of LBD or PD. Wife is his caregiver. labwork earlier this year reassuring (RPR, B12, TSH, etc)  Will proceed with head imaging (has not had done yet)  (had head CT 2019 from OSH after fall with head laceration, I don't have records of this)

## 2020-06-10 NOTE — Assessment & Plan Note (Signed)
Update levels. Stable period without easy bruising/bleeding.

## 2020-06-10 NOTE — Assessment & Plan Note (Signed)
Update labs.  

## 2020-06-10 NOTE — Assessment & Plan Note (Signed)
Continue vit D replacement.  

## 2020-06-10 NOTE — Assessment & Plan Note (Signed)
H/o bifascicular block with loop recorder in place.  I encouraged they call EP to see when future appt due.

## 2020-06-10 NOTE — Assessment & Plan Note (Signed)
Continues oral replacement

## 2020-06-10 NOTE — Telephone Encounter (Signed)
Please call wife [name] -  I wanted to verify - is he taking the new midodrine medicine (to raise blood pressures) started at the hospital? Also, I have ordered a head CT to further evaluate progressive memory trouble. We will be in touch to schedule this.

## 2020-06-10 NOTE — Assessment & Plan Note (Signed)
Had PAD evaluation 03/2020 - no indication at this time for invasive studies, further eval/treatment besides risk factor modification

## 2020-06-10 NOTE — Assessment & Plan Note (Signed)
Chronic, not on statin.

## 2020-06-13 NOTE — Telephone Encounter (Signed)
Spoke with pt's wife, [name] (POA), relaying Dr. Synthia Innocent message.  She confirms pt is taking midodrine started at the hospital.  Has head CT scheduled 06/15/20.  FYI to Dr. Darnell Level.

## 2020-06-15 ENCOUNTER — Other Ambulatory Visit: Payer: Self-pay

## 2020-06-15 ENCOUNTER — Ambulatory Visit
Admission: RE | Admit: 2020-06-15 | Discharge: 2020-06-15 | Disposition: A | Discharge status: Home / Self Care | Payer: Medicare Other | Source: Ambulatory Visit | Attending: Family Medicine | Admitting: Family Medicine

## 2020-06-15 DIAGNOSIS — G319 Degenerative disease of nervous system, unspecified: Secondary | ICD-10-CM | POA: Diagnosis not present

## 2020-06-15 DIAGNOSIS — F0390 Unspecified dementia without behavioral disturbance: Secondary | ICD-10-CM | POA: Diagnosis present

## 2020-06-15 DIAGNOSIS — R413 Other amnesia: Secondary | ICD-10-CM | POA: Diagnosis not present

## 2020-06-15 DIAGNOSIS — I6782 Cerebral ischemia: Secondary | ICD-10-CM | POA: Diagnosis not present

## 2020-06-15 MED ORDER — MIDODRINE HCL 2.5 MG PO TABS: 2.5000 mg | ORAL_TABLET | Freq: Three times a day (TID) | ORAL | 3 refills | Status: DC

## 2020-06-25 NOTE — Progress Notes (Signed)
Cardiology Office Note Date:  06/27/2020  Patient ID:  Dylan, [name] June 23, 1934, MRN [id number] PCP:  [name], MD  Cardiologist:  Dr. Bettina Gavia EP: Dr. Curt Bears   Chief Complaint:  hospital follow up  History of Present Illness: Dylan [name] is a 86 y.o. male with history of HLD, dementia, CKD (III), RBBB and syncope  He comes in today to be seen for Dr. Curt Bears, last seen by him via tele health visit 04/16/2019. Noted had an incidental finding of an SVT on his loop associated with no symptoms, and no syncope He did mention a few episodes of weakness/fatigued, associated with pallor, did not send any symptom tracings, remotes had not noted any abnormalities. Discussed SVT episodes as asymptomatic, short and with low resting HR no changes.  Asked to make symptom episodes when he feels poorly.  More recently he saw Dr. Gwenlyn [name] 03/28/20 referred for evaluation of PVD.  Did not feel any intervention was needed for his doppler findings. No reports of claudication.   He was admitted to [facility] 05/31/20 with acute onset of worsening generalized weakness since yesterday with associated mild dyspnea. In the ER, VSS WBC 23.1, lactic acid 3.9, mild bump in Creat 1.59 (baselin 1.18) Started on IFV and antibiotics Suspect pneumonia Discharged on PO antibiotics and started on midodrine as well Discharged 06/02/20.  TODAY He comes accompanied by his wife, he has known dementia, and he defers to her for nearly all questions. His wife does not his dementia is progressing. He is sleeping/nappingmost of the day and up/about all night Since home he has hot had any weak spells, or look pale to her He denies any CP SOB, she concurs, he does not complain at home of any symptoms.  Prior to going to the hospital he has times where he would get weak, look pale, she mentions one time she went to check on him, he was sitting down in the shower.  Denied falling with no signs of injury, but told her he just  needed to sit down. So far no more of these type of events    Device information MDT loop  Implanted 09/30/2018 for syncope  Past Medical History:  Diagnosis Date  . Basal cell carcinoma of right nasal sidewall 12/15/2014  . Bilateral hearing loss 08/03/2018   S/p audiology evaluation, has new hearing aides.  . Cataract extraction status 03/06/2017  . Cellulitis    due to nail impaction thru boot L  . 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 CKD (chronic kidney disease) stage 3, GFR 30-59 ml/min 01/13/2019  . Dementia ([place])   . Dysphagia 02/19/2020  . ERECTILE DYSFUNCTION, MILD 02/03/2007   Qualifier: Diagnosis of  By: Council Mechanic MD, Hilaria Ota   . Eversion of right lacrimal punctum 05/17/2015  . 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 HYDROCELE, LEFT 02/03/2007  . Hyperlipidemia 12/1999  . 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 Medicare annual wellness visit, subsequent 08/11/2019  . Mohs defect of eyelid 12/14/2014  . Nuclear sclerosis, left 04/01/2017  . Obstruction of right lacrimal duct 10/16/2015  . PAD (peripheral artery disease) ([place]) 03/21/2020   Abnormal ABI, abnormal arterial duplex 03/2020:  R - moderate RLE disease, abnormal TBI L - WNL, abnormal TBI Referred for vascular consult - rec medical management  . Pruritus 02/19/2020  . Recurrent syncope 06/22/2018  . 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 Right leg swelling 02/19/2020  . Sepsis ([place])  05/30/2020  . Thrombocytopenia ([place]) 02/21/2011   periph smear stable 02/2020  . Vitamin B12 deficiency 08/03/2018   Completed b12 shots 09/2018, maintaining levels on oral replacement (dissolvable tablets) 01/2019  . Vitamin D deficiency 05/19/2018    Past Surgical History:  Procedure Laterality Date  . BASAL CELL CARCINOMA EXCISION Right 01/2015   with reconstructive surgery around eye  . CATARACT EXTRACTION Left 03/2017  . ETT  09/2018   negative for ischemia, extremely poor  exercise tolerance ([place])  . 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 KNEE ARTHROSCOPY  1993   Right  . KNEE ARTHROSCOPY  01/2001   Left  . LOOP RECORDER INSERTION N/A 09/30/2018   Procedure: LOOP RECORDER INSERTION;  Surgeon: Constance Haw, MD;  Location: [place] [facility];  Service: Cardiovascular;  Laterality: N/A;    Current Outpatient Medications  Medication Sig Dispense Refill  . Cholecalciferol (VITAMIN D3) 1000 units CAPS Take 1 capsule (1,000 Units total) by mouth daily. 30 capsule   . memantine (NAMENDA XR) 14 MG CP24 24 hr capsule Take 1 capsule (14 mg total) by mouth daily. 90 capsule 3  . midodrine (PROAMATINE) 2.5 MG tablet Take 1 tablet (2.5 mg total) by mouth 3 (three) times daily with meals. 90 tablet 3  . vitamin B-12 (V-R VITAMIN B-12) 500 MCG tablet Take 1 tablet (500 mcg total) by mouth daily.     No current facility-administered medications for this visit.    Allergies:   Doxepin and Lincomycin hcl   Social History:  The patient  reports that he quit smoking about 59 years ago. His smoking use included cigarettes. He has never used smokeless tobacco. He reports that he does not drink alcohol and does not use drugs.   Family History:  The patient's family history includes Cataracts in his sister; Congestive Heart Failure in his father and mother; Leukemia in his brother; Stroke (age of onset: 56) in his father.  ROS:  Please see the history of present illness. All other systems are reviewed and otherwise negative.   PHYSICAL EXAM:  VS:  BP 106/66   Pulse 69   Ht 5\' 9"  (1.753 m)   Wt 190 lb (86.2 kg)   SpO2 97%   BMI 28.06 kg/m  BMI: Body mass index is 28.06 kg/m. Well nourished, well developed, in no acute distress  HEENT: normocephalic, atraumatic  Neck: no JVD, carotid bruits or masses Cardiac:  RRR; no significant murmurs, no rubs, or gallops Lungs:  CTA b/l, no wheezing, rhonchi or rales  Abd: soft, nontender MS: no  deformity or atrophy Ext: trace-1+ ankle edema  Skin: warm and dry, no rash Neuro:  No gross deficits appreciated Psych: euthymic mood, full affect  ILR site is stable, no tethering or discomfort   EKG:  Not done today  ILR interrogation done today and reviewed by myself: Battery is good, SR 62bpm, R waves 0.8mV Since his last interrogation he has had One pisode labled AF that is SR with PACs, PVCs (3hrs68min 4 tachy episodes, longest 31 seconds (known SVT) All of these occurred during his hospital stay, noting the day of his admission or leading to it No brady or pause episodes  09/04/2018: ETT  Blood pressure demonstrated a normal response to exercise.  Clinically and electrically negative for ischemia  Extremelly poor exercise tolerance Dr. Curt Bears commented:  Poor exercise tolerance. No evidence of chronotropic incompetence. Will need Linq monitoring for episode of syncope.  14 day monitor Aug 2019  Indication, syncope  Baseline rhythm, sinus minimum maximum and average rate 44 136 and 64 bpm  Supraventricular ectopy, less than 1% there were frequent 297 runs of APCs longest lasting 11.2 seconds with a rate of 122 bpm the fastest 5 complexes a rate of 169 bpm.  There are no episodes of atrial fibrillation or flutter.  Ventricular ectopy, rare isolated APCs 5 couplets and one triplet less than 1% burden. There were no episodes of sinus node or AV nodal block There are no symptomatic events Conclusion brief runs of atrial premature contractions   Recent Labs: 02/22/2020: ALT 20; TSH 1.56 06/06/2020: BUN 10; Creatinine, Ser 1.34; Hemoglobin 16.0; Platelets 136.0; Potassium 4.6; Sodium 138  08/04/2019: Cholesterol 175; HDL 34.60; LDL Cholesterol 113; Total CHOL/HDL Ratio 5; Triglycerides 138.0; VLDL 27.6   CrCl cannot be calculated (Patient's most recent lab result is older than the maximum 21 days allowed.).   Wt Readings from Last 3 Encounters:  06/27/20 190  lb (86.2 kg)  06/06/20 191 lb 9.6 oz (86.9 kg)  05/30/20 190 lb (86.2 kg)     Other studies reviewed: Additional studies/records reviewed today include: summarized above  ASSESSMENT AND PLAN:  1. Syncope     Baseline conduction system disease, RBBB, LAD     No brady or pause episodes  Recent admission with acute weakness, SOB associated with sepsis and pneumonia Brief SVT's only  BP's today (done by myself) Sitting 110/60 Standing 96/62 1 minute standing 108/64 3 minutes 90/62  Hypotension apparently observed in the hospital and started on midodrine He has mild dip in BP today without symptoms He has poor oral water intake, his wife states he eats well.  Discussed importance of adequate hydration with evidence of dehydration (also felt to be infected) with high creat in the hospital  They are given a symptom activator and instructed on use.  Though while he has conduction disease, so far I think his weakness may be hypotension   Disposition: F/u with [place] in 2-26mo, sooner if needed  Current medicines are reviewed at length with the patient today.  The patient did not have any concerns regarding medicines.  Venetia Night, PA-C 06/27/2020 1:03 PM     [place] [address] [phone] (office)  [phone] (fax)

## 2020-06-27 ENCOUNTER — Other Ambulatory Visit: Payer: Self-pay

## 2020-06-27 ENCOUNTER — Ambulatory Visit (INDEPENDENT_AMBULATORY_CARE_PROVIDER_SITE_OTHER): Payer: Medicare Other | Admitting: Physician Assistant

## 2020-06-27 VITALS — BP 106/66 | HR 69 | Ht 69.0 in | Wt 190.0 lb

## 2020-06-27 DIAGNOSIS — R55 Syncope and collapse: Secondary | ICD-10-CM

## 2020-06-27 DIAGNOSIS — I4510 Unspecified right bundle-branch block: Secondary | ICD-10-CM | POA: Diagnosis not present

## 2020-06-27 DIAGNOSIS — I471 Supraventricular tachycardia: Secondary | ICD-10-CM | POA: Diagnosis not present

## 2020-06-27 NOTE — Patient Instructions (Signed)
Medication Instructions:    Your physician recommends that you continue on your current medications as directed. Please refer to the Current Medication list given to you today.  *If you need a refill on your cardiac medications before your next appointment, please call your pharmacy*   Lab Work: [facility]   If you have labs (blood work) drawn today and your tests are completely normal, you will receive your results only by: Marland Kitchen MyChart Message (if you have MyChart) OR . A paper copy in the mail If you have any lab test that is abnormal or we need to change your treatment, we will call you to review the results.   Testing/Procedures: NONE ORDERED  TODAY     Follow-Up: At [facility], you and your health needs are our priority.  As part of our continuing mission to provide you with exceptional heart care, we have created designated Provider Care Teams.  These Care Teams include your primary Cardiologist (physician) and Advanced Practice Providers (APPs -  Physician Assistants and Nurse Practitioners) who all work together to provide you with the care you need, when you need it.  We recommend signing up for the patient portal called "MyChart".  Sign up information is provided on this After Visit Summary.  MyChart is used to connect with patients for Virtual Visits (Telemedicine).  Patients are able to view lab/test results, encounter notes, upcoming appointments, etc.  Non-urgent messages can be sent to your provider as well.   To learn more about what you can do with MyChart, go to [email or web address].    Your next appointment:   2-3  months   The format for your next appointment:   In Person  Provider:   You may see Will [name], MD or one of the following Advanced Practice Providers on your designated Care Team:    Chanetta Marshall, NP  [name], PA-C  Legrand Como "Oda Kilts, [place]    Other Instructions

## 2020-07-02 LAB — CUP PACEART REMOTE DEVICE CHECK
Date Time Interrogation Session: 20210819001427
Implantable Pulse Generator Implant Date: 20191120

## 2020-07-03 ENCOUNTER — Ambulatory Visit (INDEPENDENT_AMBULATORY_CARE_PROVIDER_SITE_OTHER): Payer: Medicare Other | Admitting: *Deleted

## 2020-07-03 DIAGNOSIS — R55 Syncope and collapse: Secondary | ICD-10-CM | POA: Diagnosis not present

## 2020-07-06 NOTE — Progress Notes (Signed)
Carelink Summary Report / Loop Recorder 

## 2020-08-08 ENCOUNTER — Other Ambulatory Visit: Payer: Self-pay | Admitting: Family Medicine

## 2020-08-08 DIAGNOSIS — E538 Deficiency of other specified B group vitamins: Secondary | ICD-10-CM

## 2020-08-08 DIAGNOSIS — E559 Vitamin D deficiency, unspecified: Secondary | ICD-10-CM

## 2020-08-08 DIAGNOSIS — D696 Thrombocytopenia, unspecified: Secondary | ICD-10-CM

## 2020-08-08 DIAGNOSIS — N1831 Chronic kidney disease, stage 3a: Secondary | ICD-10-CM

## 2020-08-08 DIAGNOSIS — E785 Hyperlipidemia, unspecified: Secondary | ICD-10-CM

## 2020-08-09 ENCOUNTER — Other Ambulatory Visit (INDEPENDENT_AMBULATORY_CARE_PROVIDER_SITE_OTHER): Payer: Medicare Other

## 2020-08-09 ENCOUNTER — Other Ambulatory Visit: Payer: Self-pay

## 2020-08-09 DIAGNOSIS — E559 Vitamin D deficiency, unspecified: Secondary | ICD-10-CM | POA: Diagnosis not present

## 2020-08-09 DIAGNOSIS — E538 Deficiency of other specified B group vitamins: Secondary | ICD-10-CM | POA: Diagnosis not present

## 2020-08-09 DIAGNOSIS — D696 Thrombocytopenia, unspecified: Secondary | ICD-10-CM

## 2020-08-09 DIAGNOSIS — E785 Hyperlipidemia, unspecified: Secondary | ICD-10-CM

## 2020-08-09 DIAGNOSIS — N1831 Chronic kidney disease, stage 3a: Secondary | ICD-10-CM | POA: Diagnosis not present

## 2020-08-09 LAB — LIPID PANEL
Cholesterol: 210 mg/dL — ABNORMAL HIGH (ref 0–200)
HDL: 34.8 mg/dL — ABNORMAL LOW (ref >39.00)
NonHDL: 175.24
Total CHOL/HDL Ratio: 6
Triglycerides: 236 mg/dL — ABNORMAL HIGH (ref 0.0–149.0)
VLDL: 47.2 mg/dL — ABNORMAL HIGH (ref 0.0–40.0)

## 2020-08-09 LAB — VITAMIN D 25 HYDROXY (VIT D DEFICIENCY, FRACTURES): VITD: 45.75 ng/mL (ref 30.00–100.00)

## 2020-08-09 LAB — COMPREHENSIVE METABOLIC PANEL
ALT: 21 U/L (ref 0–53)
AST: 23 U/L (ref 0–37)
Albumin: 4.3 g/dL (ref 3.5–5.2)
Alkaline Phosphatase: 73 U/L (ref 39–117)
BUN: 15 mg/dL (ref 6–23)
CO2: 31 mEq/L (ref 19–32)
Calcium: 9.5 mg/dL (ref 8.4–10.5)
Chloride: 101 mEq/L (ref 96–112)
Creatinine, Ser: 1.38 mg/dL (ref 0.40–1.50)
GFR: 49.03 mL/min — ABNORMAL LOW (ref >60.00)
Glucose, Bld: 79 mg/dL (ref 70–99)
Potassium: 4.5 mEq/L (ref 3.5–5.1)
Sodium: 139 mEq/L (ref 135–145)
Total Bilirubin: 0.6 mg/dL (ref 0.2–1.2)
Total Protein: 6.7 g/dL (ref 6.0–8.3)

## 2020-08-09 LAB — CBC WITH DIFFERENTIAL/PLATELET
Basophils Absolute: 0.1 10*3/uL (ref 0.0–0.1)
Basophils Relative: 1.1 % (ref 0.0–3.0)
Eosinophils Absolute: 0 10*3/uL (ref 0.0–0.7)
Eosinophils Relative: 0.8 % (ref 0.0–5.0)
HCT: 45.2 % (ref 39.0–52.0)
Hemoglobin: 15.5 g/dL (ref 13.0–17.0)
Lymphocytes Relative: 25.3 % (ref 12.0–46.0)
Lymphs Abs: 1.4 10*3/uL (ref 0.7–4.0)
MCHC: 34.3 g/dL (ref 30.0–36.0)
MCV: 95.4 fl (ref 78.0–100.0)
Monocytes Absolute: 0.7 10*3/uL (ref 0.1–1.0)
Monocytes Relative: 12.8 % — ABNORMAL HIGH (ref 3.0–12.0)
Neutro Abs: 3.4 10*3/uL (ref 1.4–7.7)
Neutrophils Relative %: 60 % (ref 43.0–77.0)
Platelets: 111 10*3/uL — ABNORMAL LOW (ref 150.0–400.0)
RBC: 4.73 Mil/uL (ref 4.22–5.81)
RDW: 13.6 % (ref 11.5–15.5)
WBC: 5.6 10*3/uL (ref 4.0–10.5)

## 2020-08-09 LAB — TSH: TSH: 1.45 u[IU]/mL (ref 0.35–4.50)

## 2020-08-09 LAB — VITAMIN B12: Vitamin B-12: 664 pg/mL (ref 211–911)

## 2020-08-09 LAB — LDL CHOLESTEROL, DIRECT: Direct LDL: 138 mg/dL

## 2020-08-10 LAB — PARATHYROID HORMONE, INTACT (NO CA): PTH: 49 pg/mL (ref 14–64)

## 2020-08-14 ENCOUNTER — Telehealth: Payer: Self-pay | Admitting: Emergency Medicine

## 2020-08-14 NOTE — Telephone Encounter (Signed)
Wife to send remote transmission when they return home. Patient had multiple episodes noted with no EGMs.

## 2020-08-15 ENCOUNTER — Telehealth: Payer: Self-pay

## 2020-08-15 NOTE — Telephone Encounter (Signed)
Patient wife called to let us know that they are having issues with the monitor and that is why they are unable to send a remote transmission and they are going to call tech support and call us back to let us know

## 2020-08-15 NOTE — Telephone Encounter (Signed)
Patient wife called back and let us know that they are going to send out a new handheld and should receive it in a few days. Patient will call us back when they get it

## 2020-08-16 ENCOUNTER — Other Ambulatory Visit: Payer: Self-pay

## 2020-08-16 ENCOUNTER — Encounter: Payer: Self-pay | Admitting: Family Medicine

## 2020-08-16 ENCOUNTER — Ambulatory Visit (INDEPENDENT_AMBULATORY_CARE_PROVIDER_SITE_OTHER): Payer: Medicare Other | Admitting: Family Medicine

## 2020-08-16 VITALS — BP 110/66 | HR 70 | Temp 97.9°F | Ht 70.5 in | Wt 190.1 lb

## 2020-08-16 DIAGNOSIS — Z0000 Encounter for general adult medical examination without abnormal findings: Secondary | ICD-10-CM

## 2020-08-16 DIAGNOSIS — F0390 Unspecified dementia without behavioral disturbance: Secondary | ICD-10-CM

## 2020-08-16 DIAGNOSIS — R55 Syncope and collapse: Secondary | ICD-10-CM

## 2020-08-16 DIAGNOSIS — Z23 Encounter for immunization: Secondary | ICD-10-CM

## 2020-08-16 DIAGNOSIS — H9193 Unspecified hearing loss, bilateral: Secondary | ICD-10-CM

## 2020-08-16 DIAGNOSIS — E538 Deficiency of other specified B group vitamins: Secondary | ICD-10-CM

## 2020-08-16 DIAGNOSIS — N1831 Chronic kidney disease, stage 3a: Secondary | ICD-10-CM

## 2020-08-16 DIAGNOSIS — E559 Vitamin D deficiency, unspecified: Secondary | ICD-10-CM

## 2020-08-16 DIAGNOSIS — E785 Hyperlipidemia, unspecified: Secondary | ICD-10-CM

## 2020-08-16 DIAGNOSIS — D696 Thrombocytopenia, unspecified: Secondary | ICD-10-CM

## 2020-08-16 DIAGNOSIS — Z7189 Other specified counseling: Secondary | ICD-10-CM

## 2020-08-16 MED ORDER — MEMANTINE HCL ER 14 MG PO CP24: 14.0000 mg | ORAL_CAPSULE | Freq: Every day | ORAL | 3 refills | Status: DC

## 2020-08-16 MED ORDER — MIDODRINE HCL 2.5 MG PO TABS: 2.5000 mg | ORAL_TABLET | Freq: Three times a day (TID) | ORAL | 11 refills | Status: DC

## 2020-08-16 NOTE — Progress Notes (Signed)
This visit was conducted in person.  BP 110/66 (BP Location: Left Arm, Patient Position: Sitting, Cuff Size: Normal)   Pulse 70   Temp 97.9 F (36.6 C) (Temporal)   Ht 5' 10.5" (1.791 m)   Wt 190 lb 1 oz (86.2 kg)   SpO2 97%   BMI 26.89 kg/m    CC: AMW Subjective:    Patient ID: Dylan [name], male    DOB: September 29, 1934, 86 y.o.   MRN: [id number]  HPI: Dylan [name] is a 86 y.o. male presenting on 08/16/2020 for Medicare Wellness   Did not see health advisor this year.    Hearing Screening   125Hz  250Hz  500Hz  1000Hz  2000Hz  3000Hz  4000Hz  6000Hz  8000Hz   Right ear:   0 0 0  0    Left ear:           Comments: Wears bilateral hearing aids. Not wearing in right ear today due to having lost it.  Heard practice tone in right ear at 40 dBHL.  Vision Screening Comments: Last eye exam, 08/2019.    Office Visit from 08/16/2020 in [place] at [facility] Total Score 0    Lost R hearing aide   Fall Risk  08/16/2020 08/11/2019 08/02/2018  Falls in the past year? 1 1 Yes  Comment - - lost balance  Number falls in past yr: 0 0 2 or more  Injury with Fall? 0 1 No    Upcoming derm appt later this month to evaluate growth on forehead.   Progressive dementia - wife notes days where he is very lost. Has been on namenda XR 14mg  daily. Stays quiet. 2-3d/wk staying in bed all day. Has not seen neurology. Slowed walking noted. Dragging feet. Not using cane. No falls or near falls.   Recurrent syncope - s/p Linq monitor without recurrent syncope. No noted h/o seizures. Recent hospitalization 05/2020 - started on midodrine TID.   Continues b12 tablets- has not found dissolvable tablets.  Drinks 10 oz coffee/day. Drinks propel water   Wife is primary caregiver - stressful periods. She continues working - brings husband to work with her.   Preventative: Colon cancer screening -aged out. Denies changes in BM or blood. Prostate cancer screening -aged out. Lung cancer  screening -not eligible Flu shot - yearly Td 2011 [place] 11/2019, 12/2019  Pneumovax - agrees to today  Shingrix -declined  Advanced directive - scanned into chart 12/2019. Wife [name] then Jeneen Rinks are [place]. Does not want life prolonging measures if terminal condition. Based on previous discussions, thinks he would be ok with feeding tube and CPR/compressions.  Seat belt use discussed  Sunscreen use discussed. No changing moles on skin. Sees dermatologist.  Ex smoker  Alcohol - none Dentist -has dentures Eye exam -yearly  Bowel - no constipation  Bladder - no incontinence, notes increasing frequency, increased need for depends.   Lives with wife [facility]) One stepson Occ: retired, Furniture conservator/restorer, 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, [employer]: HS Activity: no regular exercise Diet: some water, fruits/vegetables daily     Relevant past medical, surgical, family and social history reviewed and updated as indicated. Interim medical history since our last visit reviewed. Allergies and medications reviewed and updated. Outpatient Medications Prior to Visit  Medication Sig Dispense Refill  . Cholecalciferol (VITAMIN D3) 1000 units CAPS Take 1 capsule (1,000 Units total) by mouth daily. 30 capsule   . vitamin B-12 (V-R VITAMIN B-12) 500 MCG tablet Take 1  tablet (500 mcg total) by mouth daily.    . memantine (NAMENDA XR) 14 MG CP24 24 hr capsule Take 1 capsule (14 mg total) by mouth daily. 90 capsule 3  . midodrine (PROAMATINE) 2.5 MG tablet Take 1 tablet (2.5 mg total) by mouth 3 (three) times daily with meals. 90 tablet 3   No facility-administered medications prior to visit.     Per HPI unless specifically indicated in ROS section below Review of Systems  Constitutional: Negative for activity change, appetite change, chills, fatigue, fever and unexpected weight change.  HENT: Negative for hearing loss.   Eyes: Negative for visual disturbance.  Respiratory: Negative  for cough, chest tightness, shortness of breath and wheezing.   Cardiovascular: Negative for chest pain, palpitations and leg swelling.  Gastrointestinal: Negative for abdominal distention, abdominal pain, blood in stool, constipation, diarrhea, nausea and vomiting.  Genitourinary: Negative for difficulty urinating and hematuria.  Musculoskeletal: Negative for arthralgias, myalgias and neck pain.  Skin: Negative for rash.  Neurological: Negative for dizziness, seizures, syncope and headaches.  Hematological: Negative for adenopathy. Does not bruise/bleed easily.  Psychiatric/Behavioral: Negative for dysphoric mood. The patient is not nervous/anxious.    Objective:  BP 110/66 (BP Location: Left Arm, Patient Position: Sitting, Cuff Size: Normal)   Pulse 70   Temp 97.9 F (36.6 C) (Temporal)   Ht 5' 10.5" (1.791 m)   Wt 190 lb 1 oz (86.2 kg)   SpO2 97%   BMI 26.89 kg/m   Wt Readings from Last 3 Encounters:  08/16/20 190 lb 1 oz (86.2 kg)  06/27/20 190 lb (86.2 kg)  06/06/20 191 lb 9.6 oz (86.9 kg)      Physical Exam Vitals and nursing note reviewed.  Constitutional:      General: He is not in acute distress.    Appearance: Normal appearance. He is well-developed. He is not ill-appearing.  HENT:     Head: Normocephalic and atraumatic.     Right Ear: Hearing, tympanic membrane, ear canal and external ear normal.     Left Ear: Hearing, tympanic membrane, ear canal and external ear normal.  Eyes:     General: No scleral icterus.    Conjunctiva/sclera: Conjunctivae normal.     Pupils: Pupils are equal, round, and reactive to light.  Neck:     Thyroid: No thyroid mass or thyromegaly.     Vascular: No carotid bruit.  Cardiovascular:     Rate and Rhythm: Normal rate and regular rhythm.     Pulses: Normal pulses.          Radial pulses are 2+ on the right side and 2+ on the left side.     Heart sounds: Normal heart sounds. No murmur heard.   Pulmonary:     Effort: Pulmonary  effort is normal. No respiratory distress.     Breath sounds: Normal breath sounds. No wheezing, rhonchi or rales.  Abdominal:     General: Abdomen is flat. Bowel sounds are normal. There is no distension.     Palpations: Abdomen is soft. There is no mass.     Tenderness: There is no abdominal tenderness. There is no guarding or rebound.     Hernia: No hernia is present.  Musculoskeletal:        General: Normal range of motion.     Cervical back: Normal range of motion and neck supple.     Right lower leg: No edema.     Left lower leg: No edema.  Lymphadenopathy:  Cervical: No cervical adenopathy.  Skin:    General: Skin is warm and dry.     Findings: No rash.  Neurological:     General: No focal deficit present.     Mental Status: He is alert and oriented to person, place, and time.     Comments:  CN grossly intact, station and gait intact No shuffling gait No cogwheel rigidity No masked facies  Known dementia  Psychiatric:        Mood and Affect: Mood normal.        Behavior: Behavior normal.        Thought Content: Thought content normal.        Judgment: Judgment normal.       Results for orders placed or performed in visit on 08/09/20  Parathyroid hormone, intact (no Ca)  Result Value Ref Range   PTH 49 14 - 64 pg/mL  CBC with Differential/Platelet  Result Value Ref Range   WBC 5.6 4.0 - 10.5 K/uL   RBC 4.73 4.22 - 5.81 Mil/uL   Hemoglobin 15.5 13.0 - 17.0 g/dL   HCT 45.2 39 - 52 %   MCV 95.4 78.0 - 100.0 fl   MCHC 34.3 30.0 - 36.0 g/dL   RDW 13.6 11.5 - 15.5 %   Platelets 111.0 (L) 150 - 400 K/uL   Neutrophils Relative % 60.0 43 - 77 %   Lymphocytes Relative 25.3 12 - 46 %   Monocytes Relative 12.8 (H) 3 - 12 %   Eosinophils Relative 0.8 0 - 5 %   Basophils Relative 1.1 0 - 3 %   Neutro Abs 3.4 1.4 - 7.7 K/uL   Lymphs Abs 1.4 0.7 - 4.0 K/uL   Monocytes Absolute 0.7 0.1 - 1.0 K/uL   Eosinophils Absolute 0.0 0 - 0 K/uL   Basophils Absolute 0.1 0 - 0  K/uL  Comprehensive metabolic panel  Result Value Ref Range   Sodium 139 135 - 145 mEq/L   Potassium 4.5 3.5 - 5.1 mEq/L   Chloride 101 96 - 112 mEq/L   CO2 31 19 - 32 mEq/L   Glucose, Bld 79 70 - 99 mg/dL   BUN 15 6 - 23 mg/dL   Creatinine, Ser 1.38 0.40 - 1.50 mg/dL   Total Bilirubin 0.6 0.2 - 1.2 mg/dL   Alkaline Phosphatase 73 39 - 117 U/L   AST 23 0 - 37 U/L   ALT 21 0 - 53 U/L   Total Protein 6.7 6.0 - 8.3 g/dL   Albumin 4.3 3.5 - 5.2 g/dL   GFR 49.03 (L) >60.00 mL/min   Calcium 9.5 8.4 - 10.5 mg/dL  TSH  Result Value Ref Range   TSH 1.45 0.35 - 4.50 uIU/mL  Lipid panel  Result Value Ref Range   Cholesterol 210 (H) 0 - 200 mg/dL   Triglycerides 236.0 (H) 0 - 149 mg/dL   HDL 34.80 (L) >39.00 mg/dL   VLDL 47.2 (H) 0.0 - 40.0 mg/dL   Total CHOL/HDL Ratio 6    NonHDL 175.24   VITAMIN D 25 Hydroxy (Vit-D Deficiency, Fractures)  Result Value Ref Range   VITD 45.75 30.00 - 100.00 ng/mL  Vitamin B12  Result Value Ref Range   Vitamin B-12 664 211 - 911 pg/mL  LDL cholesterol, direct  Result Value Ref Range   Direct LDL 138.0 mg/dL   Assessment & Plan:  This visit occurred during the SARS-CoV-2 public health emergency.  Safety protocols were in place, including screening  questions prior to the visit, additional usage of staff PPE, and extensive cleaning of exam room while observing appropriate contact time as indicated for disinfecting solutions.   Problem List Items Addressed This Visit    Vitamin D deficiency    Chronic, stable on vit D replacement.       Vitamin B12 deficiency    Continue 2530mcg oral replacement - good levels on recent check.  Consider checking IF Ab next labs.       Thrombocytopenia (HCC)    Chronic, stable. No increased bleeding/bruising noted.       Recurrent syncope    Linq monitor in place followed by EP.       Medicare annual wellness visit, subsequent - Primary    I have personally reviewed the Medicare Annual Wellness questionnaire  and have noted 1. The patient's medical and social history 2. Their use of alcohol, tobacco or illicit drugs 3. Their current medications and supplements 4. The patient's functional ability including ADL's, fall risks, home safety risks and hearing or visual impairment. Cognitive function has been assessed and addressed as indicated.  5. Diet and physical activity 6. Evidence for depression or mood disorders The patients weight, height, BMI have been recorded in the chart. I have made referrals, counseling and provided education to the patient based on review of the above and I have provided the pt with a written personalized care plan for preventive services. Provider list updated.. See scanned questionairre as needed for further documentation. Reviewed preventative protocols and updated unless pt declined.       Health maintenance examination    Preventative protocols reviewed and updated unless pt declined. Discussed healthy diet and lifestyle.       Dyslipidemia    Overall stable period off statin - will continue managing with efforts towards low cholesterol diet. The ASCVD Risk score Mikey Bussing DC Jr., et al., 2013) failed to calculate for the following reasons:   The 2013 ASCVD risk score is only valid for ages 47 to 37       Dementia ([place])    Progressive dementia over the past 2 years, continues namenda XR 14 mg daily.  Anticipate Alz, less likely Parkinson's disease. Offered neurology referral for further evaluation. They would like to defer for now. Provided with local senior resources through [employer].  Wife struggling through caregiver stressors.       Relevant Medications   memantine (NAMENDA XR) 14 MG CP24 24 hr capsule   CKD (chronic kidney disease) stage 3, GFR 30-59 ml/min (HCC)    Stable period. Cr ranges 1.2-1.4. PTH normal.       Bilateral hearing loss    Wears hearing aides, lost R side - replacement pending       Advanced care planning/counseling  discussion    Advanced directive - scanned into chart 12/2019. Wife [name] then Jeneen Rinks are [place]. Does not want life prolonging measures if terminal condition. Based on previous discussions, thinks he would be ok with feeding tube and CPR/compressions.        Other Visit Diagnoses    Need for influenza vaccination       Relevant Orders   Flu Vaccine QUAD High Dose(Fluad) (Completed)   Need for 23-polyvalent pneumococcal polysaccharide vaccine       Relevant Orders   Pneumococcal polysaccharide vaccine 23-valent greater than or equal to 2yo subcutaneous/IM (Completed)       Meds ordered this encounter  Medications  . memantine (NAMENDA XR) 14 MG CP24  24 hr capsule    Sig: Take 1 capsule (14 mg total) by mouth daily.    Dispense:  90 capsule    Refill:  3  . midodrine (PROAMATINE) 2.5 MG tablet    Sig: Take 1 tablet (2.5 mg total) by mouth 3 (three) times daily with meals.    Dispense:  90 tablet    Refill:  11   Orders Placed This Encounter  Procedures  . Flu Vaccine QUAD High Dose(Fluad)  . Pneumococcal polysaccharide vaccine 23-valent greater than or equal to 2yo subcutaneous/IM    Patient instructions: Flu shot today  Pneumovax today.  Local caregiver resources - [email or web address]  Return as needed or in 6 months for follow up visit.   Follow up plan: No follow-ups on file.  [name], MD

## 2020-08-16 NOTE — Patient Instructions (Addendum)
Flu shot today  Pneumovax today.  Local caregiver resources - [email or web address]  Return as needed or in 6 months for follow up visit.   Health Maintenance After Age 84 After age 41, you are at a higher risk for certain long-term diseases and infections as well as injuries from falls. Falls are a major cause of broken bones and head injuries in people who are older than age 33. Getting regular preventive care can help to keep you healthy and well. Preventive care includes getting regular testing and making lifestyle changes as recommended by your health care provider. Talk with your health care provider about:  Which screenings and tests you should have. A screening is a test that checks for a disease when you have no symptoms.  A diet and exercise plan that is right for you. What should I know about screenings and tests to prevent falls? Screening and testing are the best ways to find a health problem early. Early diagnosis and treatment give you the best chance of managing medical conditions that are common after age 15. Certain conditions and lifestyle choices may make you more likely to have a fall. Your health care provider may recommend:  Regular vision checks. Poor vision and conditions such as cataracts can make you more likely to have a fall. If you wear glasses, make sure to get your prescription updated if your vision changes.  Medicine review. Work with your health care provider to regularly review all of the medicines you are taking, including over-the-counter medicines. Ask your health care provider about any side effects that may make you more likely to have a fall. Tell your health care provider if any medicines that you take make you feel dizzy or sleepy.  Osteoporosis screening. Osteoporosis is a condition that causes the bones to get weaker. This can make the bones weak and cause them to break more easily.  Blood pressure screening. Blood pressure changes and medicines to  control blood pressure can make you feel dizzy.  Strength and balance checks. Your health care provider may recommend certain tests to check your strength and balance while standing, walking, or changing positions.  Foot health exam. Foot pain and numbness, as well as not wearing proper footwear, can make you more likely to have a fall.  Depression screening. You may be more likely to have a fall if you have a fear of falling, feel emotionally low, or feel unable to do activities that you used to do.  Alcohol use screening. Using too much alcohol can affect your balance and may make you more likely to have a fall. What actions can I take to lower my risk of falls? General instructions  Talk with your health care provider about your risks for falling. Tell your health care provider if: ? You fall. Be sure to tell your health care provider about all falls, even ones that seem minor. ? You feel dizzy, sleepy, or off-balance.  Take over-the-counter and prescription medicines only as told by your health care provider. These include any supplements.  Eat a healthy diet and maintain a healthy weight. A healthy diet includes low-fat dairy products, low-fat (lean) meats, and fiber from whole grains, beans, and lots of fruits and vegetables. Home safety  Remove any tripping hazards, such as rugs, cords, and clutter.  Install safety equipment such as grab bars in bathrooms and safety rails on stairs.  Keep rooms and walkways well-lit. Activity   Follow a regular exercise program to stay fit. This  will help you maintain your balance. Ask your health care provider what types of exercise are appropriate for you.  If you need a cane or walker, use it as recommended by your health care provider.  Wear supportive shoes that have nonskid soles. Lifestyle  Do not drink alcohol if your health care provider tells you not to drink.  If you drink alcohol, limit how much you have: ? 0-1 drink a day  for women. ? 0-2 drinks a day for men.  Be aware of how much alcohol is in your drink. In the [place] one drink equals one typical bottle of beer (12 oz), one-half glass of wine (5 oz), or one shot of hard liquor (1 oz).  Do not use any products that contain nicotine or tobacco, such as cigarettes and e-cigarettes. If you need help quitting, ask your health care provider. Summary  Having a healthy lifestyle and getting preventive care can help to protect your health and wellness after age 16.  Screening and testing are the best way to find a health problem early and help you avoid having a fall. Early diagnosis and treatment give you the best chance for managing medical conditions that are more common for people who are older than age 67.  Falls are a major cause of broken bones and head injuries in people who are older than age 69. Take precautions to prevent a fall at home.  Work with your health care provider to learn what changes you can make to improve your health and wellness and to prevent falls. This information is not intended to replace advice given to you by your health care provider. Make sure you discuss any questions you have with your health care provider. Document Revised: 02/18/2019 Document Reviewed: 09/10/2017 Elsevier Patient Education  2020 [employer].

## 2020-08-16 NOTE — Assessment & Plan Note (Signed)
Advanced directive - scanned into chart 12/2019. Wife [name] then Jeneen Rinks are [place]. Does not want life prolonging measures if terminal condition. Based on previous discussions, thinks he would be ok with feeding tube and CPR/compressions.

## 2020-08-16 NOTE — Assessment & Plan Note (Signed)
Preventative protocols reviewed and updated unless pt declined. Discussed healthy diet and lifestyle.  

## 2020-08-16 NOTE — Assessment & Plan Note (Signed)

## 2020-08-17 NOTE — Assessment & Plan Note (Addendum)
Overall stable period off statin - will continue managing with efforts towards low cholesterol diet. The ASCVD Risk score Dylan Bussing DC Jr., et al., 2013) failed to calculate for the following reasons:   The 2013 ASCVD risk score is only valid for ages 59 to 43

## 2020-08-17 NOTE — Assessment & Plan Note (Signed)
Stable period. Cr ranges 1.2-1.4. PTH normal.

## 2020-08-17 NOTE — Assessment & Plan Note (Addendum)
Progressive dementia over the past 2 years, continues namenda XR 14 mg daily.  Anticipate Alz, less likely Parkinson's disease. Offered neurology referral for further evaluation. They would like to defer for now. Provided with local senior resources through [employer].  Wife struggling through caregiver stressors.

## 2020-08-17 NOTE — Assessment & Plan Note (Addendum)
Continue 255mcg oral replacement - good levels on recent check.  Consider checking IF Ab next labs.

## 2020-08-17 NOTE — Assessment & Plan Note (Signed)
Linq monitor in place followed by EP.

## 2020-08-17 NOTE — Assessment & Plan Note (Signed)
Chronic, stable on vit D replacement.

## 2020-08-17 NOTE — Assessment & Plan Note (Signed)
Wears hearing aides, lost R side - replacement pending

## 2020-08-17 NOTE — Assessment & Plan Note (Signed)
Chronic, stable. No increased bleeding/bruising noted.

## 2020-08-22 NOTE — Telephone Encounter (Signed)
Patient has received the new handheld and will send transmission

## 2020-08-24 ENCOUNTER — Telehealth: Payer: Self-pay | Admitting: Student

## 2020-08-24 NOTE — Telephone Encounter (Signed)
Carelink alert for tachy event and missing episodes. Pt needs to send manual transmission.    LMOM to CB.

## 2020-08-24 NOTE — Telephone Encounter (Signed)
Discussed with patient's wife. Pt denies symptoms.   Full report reviewed.   Reports consistent with previous findings of SR with ectopy and paroxysmal atrial tachycardia.   Dylan [name] [address] [place], PA-C  08/24/2020 1:43 PM

## 2020-08-29 ENCOUNTER — Encounter: Payer: Self-pay | Admitting: Cardiology

## 2020-08-29 ENCOUNTER — Ambulatory Visit: Payer: Medicare Other | Admitting: Cardiology

## 2020-08-29 ENCOUNTER — Other Ambulatory Visit: Payer: Self-pay

## 2020-08-29 VITALS — BP 106/60 | HR 61 | Ht 73.0 in | Wt 191.0 lb

## 2020-08-29 DIAGNOSIS — R55 Syncope and collapse: Secondary | ICD-10-CM

## 2020-08-29 NOTE — Progress Notes (Signed)
Electrophysiology Office Note   Date:  08/29/2020   ID:  Dylan [name], DOB 01/14/1934, MRN [id number]  PCP:  [name], MD  Cardiologist:  Bettina Gavia Primary Electrophysiologist:  Valkyrie Guardiola [name], MD    No chief complaint on file.    History of Present Illness: Dylan [name] is a 86 y.o. male who is being seen today for the evaluation of syncope at the request of Shirlee More. Presenting today for electrophysiology evaluation.    He has a history of hyperlipidemia.  He had an episode of lightheadedness but no syncope.  He did not have palpitations or chest pain.  He wore a 14-day monitor that showed PACs but no symptomatic bradycardia.  He has had three events of syncope in the last 2 years.  Each event occurred fairly rapidly.  He has fallen one time, the other two he was able to sit down in a chair.  He does not have dizziness, weakness, fatigue prior to these episodes.  He is now status post Linq monitor implant.  Today, denies symptoms of palpitations, chest pain, shortness of breath, orthopnea, PND, lower extremity edema, claudication, dizziness, presyncope, syncope, bleeding, or neurologic sequela. The patient is tolerating medications without difficulties.  Since last being seen he has done well.  He has had no further episodes of syncope.  He is able to do all of his daily activities.  His limitation is his dementia.  Past Medical History:  Diagnosis Date  . Basal cell carcinoma of right nasal sidewall 12/15/2014  . Bilateral hearing loss 08/03/2018   S/p audiology evaluation, has new hearing aides.  . Cataract extraction status 03/06/2017  . Cellulitis    due to nail impaction thru boot L  . 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 CKD (chronic kidney disease) stage 3, GFR 30-59 ml/min ([place]) 01/13/2019  . Dementia ([place])   . Dysphagia 02/19/2020  . ERECTILE DYSFUNCTION, MILD 02/03/2007   Qualifier: Diagnosis of  By: Council Mechanic MD, Hilaria Ota   . Eversion of right lacrimal  punctum 05/17/2015  . 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 HYDROCELE, LEFT 02/03/2007  . Hyperlipidemia 12/1999  . 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 Medicare annual wellness visit, subsequent 08/11/2019  . Mohs defect of eyelid 12/14/2014  . Nuclear sclerosis, left 04/01/2017  . Obstruction of right lacrimal duct 10/16/2015  . PAD (peripheral artery disease) ([place]) 03/21/2020   Abnormal ABI, abnormal arterial duplex 03/2020:  R - moderate RLE disease, abnormal TBI L - WNL, abnormal TBI Referred for vascular consult - rec medical management  . Pruritus 02/19/2020  . Recurrent syncope 06/22/2018  . 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 Right leg swelling 02/19/2020  . Sepsis ([place]) 05/30/2020  . Thrombocytopenia ([place]) 02/21/2011   periph smear stable 02/2020  . Vitamin B12 deficiency 08/03/2018   Completed b12 shots 09/2018, maintaining levels on oral replacement (dissolvable tablets) 01/2019  . Vitamin D deficiency 05/19/2018   Past Surgical History:  Procedure Laterality Date  . BASAL CELL CARCINOMA EXCISION Right 01/2015   with reconstructive surgery around eye  . CATARACT EXTRACTION Left 03/2017  . ETT  09/2018   negative for ischemia, extremely poor exercise tolerance ([place])  . 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 KNEE ARTHROSCOPY  1993   Right  . KNEE ARTHROSCOPY  01/2001   Left  . LOOP RECORDER INSERTION N/A 09/30/2018   Procedure: LOOP RECORDER  INSERTION;  Surgeon: Constance Haw, MD;  Location: [facility];  Service: Cardiovascular;  Laterality: N/A;     Current Outpatient Medications  Medication Sig Dispense Refill  . Cholecalciferol (VITAMIN D3) 1000 units CAPS Take 1 capsule (1,000 Units total) by mouth daily. 30 capsule   . memantine (NAMENDA XR) 14 MG CP24 24 hr capsule Take 1 capsule (14 mg total) by mouth daily. 90 capsule 3  . midodrine (PROAMATINE) 2.5 MG  tablet Take 1 tablet (2.5 mg total) by mouth 3 (three) times daily with meals. 90 tablet 11  . vitamin B-12 (V-R VITAMIN B-12) 500 MCG tablet Take 1 tablet (500 mcg total) by mouth daily.     No current facility-administered medications for this visit.    Allergies:   Doxepin and Lincomycin hcl   Social History:  The patient  reports that he quit smoking about 59 years ago. His smoking use included cigarettes. He has never used smokeless tobacco. He reports that he does not drink alcohol and does not use drugs.   Family History:  The patient's family history includes Cataracts in his sister; Congestive Heart Failure in his father and mother; Leukemia in his brother; Stroke (age of onset: 56) in his father.   ROS:  Please see the history of present illness.   Otherwise, review of systems is positive for none.   All other systems are reviewed and negative.   PHYSICAL EXAM: VS:  BP 106/60   Pulse 61   Ht 6\' 1"  (1.854 m)   Wt 191 lb (86.6 kg)   SpO2 97%   BMI 25.20 kg/m  , BMI Body mass index is 25.2 kg/m. GEN: Well nourished, well developed, in no acute distress  HEENT: normal  Neck: no JVD, carotid bruits, or masses Cardiac: RRR; no murmurs, rubs, or gallops,no edema  Respiratory:  clear to auscultation bilaterally, normal work of breathing GI: soft, nontender, nondistended, + BS MS: no deformity or atrophy  Skin: warm and dry, device site well healed Neuro:  Strength and sensation are intact Psych: euthymic mood, full affect  EKG:  EKG is ordered today. Personal review of the ekg ordered shows sinus rhythm, right bundle branch block, PACs  Personal review of the device interrogation today. Results in [facility]: 08/09/2020: ALT 21; BUN 15; Creatinine, Ser 1.38; Hemoglobin 15.5; Platelets 111.0; Potassium 4.5; Sodium 139; TSH 1.45    Lipid Panel     Component Value Date/Time   CHOL 210 (H) 08/09/2020 1153   TRIG 236.0 (H) 08/09/2020 1153   HDL 34.80 (L)  08/09/2020 1153   CHOLHDL 6 08/09/2020 1153   VLDL 47.2 (H) 08/09/2020 1153   LDLCALC 113 (H) 08/04/2019 1006   LDLDIRECT 138.0 08/09/2020 1153     Wt Readings from Last 3 Encounters:  08/29/20 191 lb (86.6 kg)  08/16/20 190 lb 1 oz (86.2 kg)  06/27/20 190 lb (86.2 kg)      Other studies Reviewed: Additional studies/ records that were reviewed today include: ZIO monitor 07/20/18 - personally reviewed Review of the above records today demonstrates:  297 runs of APCs longest lasting 11.2 seconds with a rate of 122 bpm the fastest 5 complexes a rate of 169 bpm.  There are no episodes of atrial fibrillation or flutter. There were no episodes of sinus node or AV nodal block There are no symptomatic events    ASSESSMENT AND PLAN:  1.  Syncope: Status post Linq monitor.  He has a  right bundle branch block, but no obvious bradycardia that would cause syncope.  He has had some SVT, but it is all been short-lived.  His wife says that he gets weak all of a sudden at times, but I cannot correlate this to his SVT episodes.  We Stephanee Barcomb have her use the symptom activator.   Current medicines are reviewed at length with the patient today.   The patient does not have concerns regarding his medicines.  The following changes were made today: None  Labs/ tests ordered today include:  Orders Placed This Encounter  Procedures  . EKG 12-Lead    Disposition:   FU 12 months  Signed, Avelardo Reesman [name], MD  08/29/2020 2:46 PM     [address] [place] [address] [phone] (office) [phone] (fax)

## 2020-08-29 NOTE — Patient Instructions (Signed)
Medication Instructions:  Your physician recommends that you continue on your current medications as directed. Please refer to the Current Medication list given to you today.  *If you need a refill on your cardiac medications before your next appointment, please call your pharmacy*   Lab Work: None ordered If you have labs (blood work) drawn today and your tests are completely normal, you will receive your results only by: Marland Kitchen MyChart Message (if you have MyChart) OR . A paper copy in the mail If you have any lab test that is abnormal or we need to change your treatment, we will call you to review the results.   Testing/Procedures: None ordered   Follow-Up: At [facility], you and your health needs are our priority.  As part of our continuing mission to provide you with exceptional heart care, we have created designated Provider Care Teams.  These Care Teams include your primary Cardiologist (physician) and Advanced Practice Providers (APPs -  Physician Assistants and Nurse Practitioners) who all work together to provide you with the care you need, when you need it.  We recommend signing up for the patient portal called "MyChart".  Sign up information is provided on this After Visit Summary.  MyChart is used to connect with patients for Virtual Visits (Telemedicine).  Patients are able to view lab/test results, encounter notes, upcoming appointments, etc.  Non-urgent messages can be sent to your provider as well.   To learn more about what you can do with MyChart, go to [email or web address].    Your next appointment:   1 year(s)  The format for your next appointment:   In Person  Provider:   [name], PA-C    Thank you for choosing [facility]!!   Trinidad Curet, RN [phone]

## 2020-08-30 ENCOUNTER — Ambulatory Visit (INDEPENDENT_AMBULATORY_CARE_PROVIDER_SITE_OTHER): Payer: Medicare Other

## 2020-08-30 DIAGNOSIS — R55 Syncope and collapse: Secondary | ICD-10-CM | POA: Diagnosis not present

## 2020-08-31 ENCOUNTER — Ambulatory Visit: Payer: Medicare Other | Admitting: Dermatology

## 2020-08-31 ENCOUNTER — Other Ambulatory Visit: Payer: Self-pay

## 2020-08-31 DIAGNOSIS — L821 Other seborrheic keratosis: Secondary | ICD-10-CM | POA: Diagnosis not present

## 2020-08-31 DIAGNOSIS — Z1283 Encounter for screening for malignant neoplasm of skin: Secondary | ICD-10-CM | POA: Diagnosis not present

## 2020-08-31 DIAGNOSIS — C4491 Basal cell carcinoma of skin, unspecified: Secondary | ICD-10-CM

## 2020-08-31 DIAGNOSIS — C44319 Basal cell carcinoma of skin of other parts of face: Secondary | ICD-10-CM

## 2020-08-31 DIAGNOSIS — D485 Neoplasm of uncertain behavior of skin: Secondary | ICD-10-CM

## 2020-08-31 DIAGNOSIS — L918 Other hypertrophic disorders of the skin: Secondary | ICD-10-CM

## 2020-08-31 DIAGNOSIS — L570 Actinic keratosis: Secondary | ICD-10-CM

## 2020-08-31 DIAGNOSIS — L578 Other skin changes due to chronic exposure to nonionizing radiation: Secondary | ICD-10-CM

## 2020-08-31 DIAGNOSIS — D1800 Hemangioma unspecified site: Secondary | ICD-10-CM

## 2020-08-31 DIAGNOSIS — L814 Other melanin hyperpigmentation: Secondary | ICD-10-CM | POA: Diagnosis not present

## 2020-08-31 DIAGNOSIS — D229 Melanocytic nevi, unspecified: Secondary | ICD-10-CM | POA: Diagnosis not present

## 2020-08-31 DIAGNOSIS — L820 Inflamed seborrheic keratosis: Secondary | ICD-10-CM

## 2020-08-31 DIAGNOSIS — C44310 Basal cell carcinoma of skin of unspecified parts of face: Secondary | ICD-10-CM

## 2020-08-31 DIAGNOSIS — D692 Other nonthrombocytopenic purpura: Secondary | ICD-10-CM

## 2020-08-31 HISTORY — DX: Basal cell carcinoma of skin, unspecified: C44.91

## 2020-08-31 LAB — CUP PACEART REMOTE DEVICE CHECK
Date Time Interrogation Session: 20211020022325
Implantable Pulse Generator Implant Date: 20191120

## 2020-08-31 NOTE — Patient Instructions (Signed)

## 2020-08-31 NOTE — Progress Notes (Signed)
Follow-Up Visit   Subjective  Dylan [name] is a 86 y.o. male who presents for the following: Annual Exam (History of BCC - TBSE) and Other (Spot on forehead x ~1 year - has been treated with LN2).  The patient presents for Total-Body Skin Exam (TBSE) for skin cancer screening and mole check.  The following portions of the chart were reviewed this encounter and updated as appropriate:  Tobacco  Allergies  Meds  Problems  Med Hx  Surg Hx  Fam Hx     Review of Systems:  No other skin or systemic complaints except as noted in HPI or Assessment and Plan.  Objective  Well appearing patient in no apparent distress; mood and affect are within normal limits.  A full examination was performed including scalp, head, eyes, ears, nose, lips, neck, chest, axillae, abdomen, back, buttocks, bilateral upper extremities, bilateral lower extremities, hands, feet, fingers, toes, fingernails, and toenails. All findings within normal limits unless otherwise noted below.  Objective  Mid Forehead: 1.1 cm crusted cutaneous horn  Objective  Face x 2, right ear x 1 (3): Erythematous thin papules/macules with gritty scale.   Objective  Left forehead: Erythematous keratotic or waxy stuck-on papule or plaque.    Assessment & Plan    Lentigines - Scattered tan macules - Discussed due to sun exposure - Benign, observe - Call for any changes  Seborrheic Keratoses - Stuck-on, waxy, tan-brown papules and plaques  - Discussed benign etiology and prognosis. - Observe - Call for any changes  Melanocytic Nevi - Tan-brown and/or pink-flesh-colored symmetric macules and papules - Benign appearing on exam today - Observation - Call clinic for new or changing moles - Recommend daily use of broad spectrum spf 30+ sunscreen to sun-exposed areas.   Hemangiomas - Red papules - Discussed benign nature - Observe - Call for any changes  Actinic Damage - diffuse scaly erythematous macules with  underlying dyspigmentation - Recommend daily broad spectrum sunscreen SPF 30+ to sun-exposed areas, reapply every 2 hours as needed.  - Call for new or changing lesions.  Skin cancer screening performed today.  Purpura - Violaceous macules and patches - Benign - Related to age, sun damage and/or use of blood thinners - Observe - Can use OTC arnica containing moisturizer such as Dermend Bruise Formula if desired - Call for worsening or other concerns  Acrochordons (Skin Tags) - Fleshy, skin-colored pedunculated papules - Benign appearing.  - Observe. - If desired, they can be removed with an in office procedure that is not covered by insurance. - Please call the clinic if you notice any new or changing lesions.   Neoplasm of uncertain behavior of skin Mid Forehead  Epidermal / dermal shaving  Lesion diameter (cm):  1.1 Informed consent: discussed and consent obtained   Timeout: patient name, date of birth, surgical site, and procedure verified   Procedure prep:  Patient was prepped and draped in usual sterile fashion Prep type:  Isopropyl alcohol Anesthesia: the lesion was anesthetized in a standard fashion   Anesthetic:  1% lidocaine w/ epinephrine 1-100,000 buffered w/ 8.4% NaHCO3 Instrument used: flexible razor blade   Hemostasis achieved with: pressure, aluminum chloride and electrodesiccation   Outcome: patient tolerated procedure well   Post-procedure details: sterile dressing applied and wound care instructions given   Dressing type: bandage and petrolatum    Destruction of lesion Complexity: extensive   Destruction method: electrodesiccation and curettage   Informed consent: discussed and consent obtained   Timeout:  patient name, date of birth, surgical site, and procedure verified Procedure prep:  Patient was prepped and draped in usual sterile fashion Prep type:  Isopropyl alcohol Anesthesia: the lesion was anesthetized in a standard fashion   Anesthetic:  1%  lidocaine w/ epinephrine 1-100,000 buffered w/ 8.4% NaHCO3 Curettage performed in three different directions: Yes   Electrodesiccation performed over the curetted area: Yes   Hemostasis achieved with:  pressure and aluminum chloride Outcome: patient tolerated procedure well with no complications   Post-procedure details: sterile dressing applied and wound care instructions given   Dressing type: bandage and petrolatum    Specimen 1 - Surgical pathology Differential Diagnosis: SCC vs other  Check Margins: No 0.6 cm crusted cutaneous horn EDC today  AK (actinic keratosis) (3) Face x 2, right ear x 1  Destruction of lesion - Face x 2, right ear x 1 Complexity: simple   Destruction method: cryotherapy   Informed consent: discussed and consent obtained   Timeout:  patient name, date of birth, surgical site, and procedure verified Lesion destroyed using liquid nitrogen: Yes   Region frozen until ice ball extended beyond lesion: Yes   Outcome: patient tolerated procedure well with no complications   Post-procedure details: wound care instructions given    Inflamed seborrheic keratosis Left forehead  Destruction of lesion - Left forehead Complexity: simple   Destruction method: cryotherapy   Informed consent: discussed and consent obtained   Timeout:  patient name, date of birth, surgical site, and procedure verified Lesion destroyed using liquid nitrogen: Yes   Region frozen until ice ball extended beyond lesion: Yes   Outcome: patient tolerated procedure well with no complications   Post-procedure details: wound care instructions given    Skin cancer screening  Return in about 1 year (around 08/31/2021) for TBSE.  I, Ashok Cordia, CMA, am acting as scribe for Sarina Ser, MD .  Documentation: I have reviewed the above documentation for accuracy and completeness, and I agree with the above.  Sarina Ser, MD

## 2020-09-05 ENCOUNTER — Encounter: Payer: Self-pay | Admitting: Dermatology

## 2020-09-05 NOTE — Progress Notes (Signed)
Carelink Summary Report / Loop Recorder 

## 2020-09-07 ENCOUNTER — Telehealth: Payer: Self-pay

## 2020-09-07 NOTE — Telephone Encounter (Signed)
Patient's wife informed of pathology results.  

## 2020-09-07 NOTE — Telephone Encounter (Signed)
-----   Message from Ralene Bathe, MD sent at 09/07/2020  9:26 AM EDT ----- Skin , mid forehead SUPERFICIAL BASAL CELL CARCINOMA SUPERIMPOSED ON A SEBORRHEIC KERATOSIS  Cancer - Needles Already treated Recheck next visit

## 2020-09-21 ENCOUNTER — Telehealth: Payer: Self-pay

## 2020-09-21 NOTE — Telephone Encounter (Signed)
ILR (implanted for syncope) alert received 09/21/20 for one new AF episode?, duration 1 hour and 2 minutes. AF burden 21.6%. Patient has hx of PAC's. Routing to Dr. Curt Bears to review if he agrees this is not AF? No OAC on file.

## 2020-09-22 NOTE — Telephone Encounter (Signed)
Appears pacs. Continue to monitor.

## 2020-10-01 LAB — CUP PACEART REMOTE DEVICE CHECK
Date Time Interrogation Session: 20211120013354
Implantable Pulse Generator Implant Date: 20191120

## 2020-10-02 ENCOUNTER — Ambulatory Visit (INDEPENDENT_AMBULATORY_CARE_PROVIDER_SITE_OTHER): Payer: Medicare Other

## 2020-10-02 DIAGNOSIS — R55 Syncope and collapse: Secondary | ICD-10-CM | POA: Diagnosis not present

## 2020-10-03 NOTE — Progress Notes (Signed)
Carelink Summary Report / Loop Recorder 

## 2020-10-17 DIAGNOSIS — Z9842 Cataract extraction status, left eye: Secondary | ICD-10-CM | POA: Diagnosis not present

## 2020-10-17 DIAGNOSIS — H04123 Dry eye syndrome of bilateral lacrimal glands: Secondary | ICD-10-CM | POA: Diagnosis not present

## 2020-10-17 DIAGNOSIS — Z9841 Cataract extraction status, right eye: Secondary | ICD-10-CM | POA: Diagnosis not present

## 2020-10-17 DIAGNOSIS — H5212 Myopia, left eye: Secondary | ICD-10-CM | POA: Diagnosis not present

## 2020-10-17 DIAGNOSIS — H0288B Meibomian gland dysfunction left eye, upper and lower eyelids: Secondary | ICD-10-CM | POA: Diagnosis not present

## 2020-10-17 DIAGNOSIS — H0288A Meibomian gland dysfunction right eye, upper and lower eyelids: Secondary | ICD-10-CM | POA: Diagnosis not present

## 2020-10-31 ENCOUNTER — Ambulatory Visit (INDEPENDENT_AMBULATORY_CARE_PROVIDER_SITE_OTHER): Payer: Medicare Other

## 2020-10-31 DIAGNOSIS — R55 Syncope and collapse: Secondary | ICD-10-CM

## 2020-10-31 LAB — CUP PACEART REMOTE DEVICE CHECK
Date Time Interrogation Session: 20211221093614
Implantable Pulse Generator Implant Date: 20191120

## 2020-11-14 NOTE — Progress Notes (Signed)
Carelink Summary Report / Loop Recorder 

## 2020-11-21 ENCOUNTER — Encounter: Payer: Self-pay | Admitting: Family Medicine

## 2020-11-21 ENCOUNTER — Other Ambulatory Visit: Payer: Self-pay

## 2020-11-21 ENCOUNTER — Ambulatory Visit (INDEPENDENT_AMBULATORY_CARE_PROVIDER_SITE_OTHER): Payer: Medicare Other | Admitting: Family Medicine

## 2020-11-21 DIAGNOSIS — E538 Deficiency of other specified B group vitamins: Secondary | ICD-10-CM | POA: Diagnosis not present

## 2020-11-21 DIAGNOSIS — F66 Other sexual disorders: Secondary | ICD-10-CM | POA: Diagnosis not present

## 2020-11-21 DIAGNOSIS — W19XXXA Unspecified fall, initial encounter: Secondary | ICD-10-CM

## 2020-11-21 DIAGNOSIS — F0391 Unspecified dementia with behavioral disturbance: Secondary | ICD-10-CM | POA: Diagnosis not present

## 2020-11-21 MED ORDER — SERTRALINE HCL 50 MG PO TABS: 50.0000 mg | ORAL_TABLET | Freq: Every day | ORAL | 3 refills | Status: DC

## 2020-11-21 NOTE — Patient Instructions (Addendum)
Orthostatic vial signs today For disinhibition - starting sertraline 50mg  daily.  This is a mood medicine that can help.  Update me with effect.

## 2020-11-21 NOTE — Progress Notes (Signed)
Patient ID: Dylan [name], male    DOB: 05/26/1934, 87 y.o.   MRN: [id number]  This visit was conducted in person.  BP 120/76 (BP Location: Left Arm, Patient Position: Sitting, Cuff Size: Normal)   Pulse 66   Temp 97.6 F (36.4 C) (Temporal)   Ht 6\' 1"  (1.854 m)   Wt 194 lb 7 oz (88.2 kg)   SpO2 100%   BMI 25.65 kg/m   No data found. Orthostatic vs: lying 118/68, standing 120/70.   CC: wife concerned about worsening dementia  Subjective:   HPI: CROSS Dylan [name] is a 87 y.o. male presenting on 11/21/2020 for Dementia (Here for f/u.  Pt accompanied by wife, [name]- temp 97.9.)   Increasing falls at home. Wife finds Dylan [name] in the mornings on the floor. She has noticed increasing sexual disinhibition (self pleasing) over the past 3 months in inappropriate places - she thinks falls happen because he slides off the bed. Wife also notices he has started picking up things that don't belong to him and using them. Increasing urinary incontinence/accidents in the bed. No significant bowel incontinence. Wife also notes repetitive behaviors ie hitting right hand against objects.   Progressive ongoing dementia - wife is caregiver. Has been on namenda XR 14mg  daily. Stays quiet. 2-3d/wk staying in bed all day. Has not seen neurology. Not using cane.   MMSE today 16/30   Syncope s/p linq monitor - RBBB, some SVT but all short lived.      Relevant past medical, surgical, family and social history reviewed and updated as indicated. Interim medical history since our last visit reviewed. Allergies and medications reviewed and updated. Outpatient Medications Prior to Visit  Medication Sig Dispense Refill  . Cholecalciferol (VITAMIN D3) 1000 units CAPS Take 1 capsule (1,000 Units total) by mouth daily. 30 capsule   . memantine (NAMENDA XR) 14 MG CP24 24 hr capsule Take 1 capsule (14 mg total) by mouth daily. 90 capsule 3  . midodrine (PROAMATINE) 2.5 MG tablet Take 1 tablet (2.5 mg total) by mouth 3  (three) times daily with meals. 90 tablet 11  . vitamin B-12 (V-R VITAMIN B-12) 500 MCG tablet Take 1 tablet (500 mcg total) by mouth daily.     No facility-administered medications prior to visit.     Per HPI unless specifically indicated in ROS section below Review of Systems Objective:  BP 120/76 (BP Location: Left Arm, Patient Position: Sitting, Cuff Size: Normal)   Pulse 66   Temp 97.6 F (36.4 C) (Temporal)   Ht 6\' 1"  (1.854 m)   Wt 194 lb 7 oz (88.2 kg)   SpO2 100%   BMI 25.65 kg/m   Wt Readings from Last 3 Encounters:  11/21/20 194 lb 7 oz (88.2 kg)  08/29/20 191 lb (86.6 kg)  08/16/20 190 lb 1 oz (86.2 kg)      Physical Exam Vitals and nursing note reviewed.  Constitutional:      Appearance: Normal appearance. He is not ill-appearing.  Eyes:     Extraocular Movements: Extraocular movements intact.     Pupils: Pupils are equal, round, and reactive to light.  Cardiovascular:     Rate and Rhythm: Normal rate and regular rhythm.     Pulses: Normal pulses.     Heart sounds: Normal heart sounds. No murmur heard.   Pulmonary:     Effort: Pulmonary effort is normal. No respiratory distress.     Breath sounds: Normal breath sounds. No wheezing, rhonchi  or rales.  Musculoskeletal:     Right lower leg: No edema.     Left lower leg: No edema.  Neurological:     Mental Status: He is alert.       Results for orders placed or performed in visit on 10/31/20  CUP PACEART REMOTE DEVICE CHECK  Result Value Ref Range   Pulse Generator Manufacturer MERM    Date Time Interrogation Session [id number]    Pulse Gen Model JAS50 Reveal LINQ    Pulse Gen Serial Number [id number]    Clinic Name [facility] Pulse Generator Type ICM/ILR    Implantable Pulse Generator Implant Date [id number]    Assessment & Plan:  This visit occurred during the SARS-CoV-2 public health emergency.  Safety protocols were in place, including screening questions prior to the  visit, additional usage of staff PPE, and extensive cleaning of exam room while observing appropriate contact time as indicated for disinfecting solutions.   Problem List Items Addressed This Visit    Vitamin B12 deficiency    Continues 569mcg b12 daily.  Consider checking IF Ab next labs if not done previously.       Sexual disinhibition    Start sertraline.       Fall    Largely related to slipping out of bed at night.       Dementia with behavioral disturbance ([place])    Progressive dementia with new sexual disinhibition and stealing behavior. Head imaging 06/2020 with diffuse atrophy, most prominent frontal lobes. ?FTD vs progressive Alz disease. Start sertraline 50mg  daily for disinhibition.  Continue namenda XR 14mg  daily.  Update with effect.  If truly FTD, consider stopping namenda.       Relevant Medications   sertraline (ZOLOFT) 50 MG tablet       Meds ordered this encounter  Medications  . sertraline (ZOLOFT) 50 MG tablet    Sig: Take 1 tablet (50 mg total) by mouth daily.    Dispense:  30 tablet    Refill:  3   No orders of the defined types were placed in this encounter.   Patient Instructions  Orthostatic vial signs today For disinhibition - starting sertraline 50mg  daily.  This is a mood medicine that can help.  Update me with effect.    Follow up plan: No follow-ups on file.  [name], MD

## 2020-11-23 DIAGNOSIS — F66 Other sexual disorders: Secondary | ICD-10-CM | POA: Insufficient documentation

## 2020-11-23 NOTE — Assessment & Plan Note (Signed)
- 

## 2020-11-23 NOTE — Assessment & Plan Note (Signed)
Largely related to slipping out of bed at night.

## 2020-11-23 NOTE — Assessment & Plan Note (Addendum)
Progressive dementia with new sexual disinhibition and stealing behavior. Head imaging 06/2020 with diffuse atrophy, most prominent frontal lobes. ?FTD vs progressive Alz disease. Start sertraline 50mg  daily for disinhibition.  Continue namenda XR 14mg  daily.  Update with effect.  If truly FTD, consider stopping namenda.

## 2020-11-23 NOTE — Assessment & Plan Note (Signed)
Continues 554mcg b12 daily.  Consider checking IF Ab next labs if not done previously.

## 2020-12-01 ENCOUNTER — Ambulatory Visit (INDEPENDENT_AMBULATORY_CARE_PROVIDER_SITE_OTHER): Payer: Medicare Other

## 2020-12-01 DIAGNOSIS — R55 Syncope and collapse: Secondary | ICD-10-CM | POA: Diagnosis not present

## 2020-12-01 LAB — CUP PACEART REMOTE DEVICE CHECK
Date Time Interrogation Session: 20220121014306
Implantable Pulse Generator Implant Date: 20191120

## 2020-12-13 ENCOUNTER — Encounter: Payer: Self-pay | Admitting: Family Medicine

## 2020-12-13 NOTE — Progress Notes (Signed)
Carelink Summary Report / Loop Recorder 

## 2021-01-01 ENCOUNTER — Ambulatory Visit (INDEPENDENT_AMBULATORY_CARE_PROVIDER_SITE_OTHER): Payer: Medicare Other

## 2021-01-01 DIAGNOSIS — R55 Syncope and collapse: Secondary | ICD-10-CM

## 2021-01-01 LAB — CUP PACEART REMOTE DEVICE CHECK
Date Time Interrogation Session: 20220221025717
Implantable Pulse Generator Implant Date: 20191120

## 2021-01-08 NOTE — Progress Notes (Signed)
Carelink Summary Report / Loop Recorder 

## 2021-02-01 ENCOUNTER — Ambulatory Visit (INDEPENDENT_AMBULATORY_CARE_PROVIDER_SITE_OTHER): Payer: Medicare Other

## 2021-02-01 DIAGNOSIS — R55 Syncope and collapse: Secondary | ICD-10-CM | POA: Diagnosis not present

## 2021-02-01 LAB — CUP PACEART REMOTE DEVICE CHECK
Date Time Interrogation Session: 20220324035733
Implantable Pulse Generator Implant Date: 20191120

## 2021-02-13 NOTE — Progress Notes (Signed)
Carelink Summary Report / Loop Recorder 

## 2021-02-14 ENCOUNTER — Encounter: Payer: Self-pay | Admitting: Family Medicine

## 2021-02-14 ENCOUNTER — Other Ambulatory Visit: Payer: Self-pay

## 2021-02-14 ENCOUNTER — Ambulatory Visit (INDEPENDENT_AMBULATORY_CARE_PROVIDER_SITE_OTHER): Payer: Medicare Other | Admitting: Family Medicine

## 2021-02-14 VITALS — BP 120/70 | HR 61 | Temp 97.8°F | Ht 73.0 in | Wt 184.2 lb

## 2021-02-14 DIAGNOSIS — R55 Syncope and collapse: Secondary | ICD-10-CM | POA: Diagnosis not present

## 2021-02-14 DIAGNOSIS — F66 Other sexual disorders: Secondary | ICD-10-CM | POA: Diagnosis not present

## 2021-02-14 DIAGNOSIS — F0391 Unspecified dementia with behavioral disturbance: Secondary | ICD-10-CM | POA: Diagnosis not present

## 2021-02-14 MED ORDER — SERTRALINE HCL 50 MG PO TABS: 50.0000 mg | ORAL_TABLET | Freq: Every day | ORAL | 1 refills | Status: DC

## 2021-02-14 NOTE — Progress Notes (Signed)
Patient ID: Dylan [name], male    DOB: 06-25-1934, 87 y.o.   MRN: [id number]  This visit was conducted in person.  BP 120/70   Pulse 61   Temp 97.8 F (36.6 C) (Temporal)   Ht 6\' 1"  (1.854 m)   Wt 184 lb 4 oz (83.6 kg)   SpO2 96%   BMI 24.31 kg/m    CC: 6 mo f/u visit  Subjective:   HPI: Dylan [name] is a 87 y.o. male presenting on 02/14/2021 for Follow-up (Here for 6 mo f/u.  Pt accompanied by wife, [name]- temp 97.7.)   See prior note for details.  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   Progressive dementia thought FTD vs alzheimer's - previous head imaging 06/2020 showing diffuse atrophy, most prominent in frontal lobes. On namenda for a long time.   Tremor has worsened.  Wetting bed nightly. Uses pad + depends at night. Uses regular depends during the day - better able to manage bladder during the day. No bowel incontinence.   Wife is sole caregiver. His son and sometimes church deacon comes to stay with him when wife needs to be out of the house. He still recognizes these people. Notes trouble recognizing other people.  During day goes with wife to her job at [employer] ([employer]).  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 He prefers to stay at home.      Relevant past medical, surgical, family and social history reviewed and updated as indicated. Interim medical history since our last visit reviewed. Allergies and medications reviewed and updated. Outpatient Medications Prior to Visit  Medication Sig Dispense Refill  . Cholecalciferol (VITAMIN D3) 1000 units CAPS Take 1 capsule (1,000 Units total) by mouth daily. 30 capsule   . memantine (NAMENDA XR) 14 MG CP24 24 hr capsule Take 1 capsule (14 mg total) by mouth daily. 90 capsule 3  . midodrine (PROAMATINE) 2.5 MG tablet Take 1 tablet (2.5 mg total) by mouth 3 (three) times daily  with meals. 90 tablet 11  . vitamin B-12 (V-R VITAMIN B-12) 500 MCG tablet Take 1 tablet (500 mcg total) by mouth daily.    . sertraline (ZOLOFT) 50 MG tablet Take 1 tablet (50 mg total) by mouth daily. 30 tablet 3   No facility-administered medications prior to visit.     Per HPI unless specifically indicated in ROS section below Review of Systems Objective:  BP 120/70   Pulse 61   Temp 97.8 F (36.6 C) (Temporal)   Ht 6\' 1"  (1.854 m)   Wt 184 lb 4 oz (83.6 kg)   SpO2 96%   BMI 24.31 kg/m   Wt Readings from Last 3 Encounters:  02/14/21 184 lb 4 oz (83.6 kg)  11/21/20 194 lb 7 oz (88.2 kg)  08/29/20 191 lb (86.6 kg)      Physical Exam Vitals and nursing note reviewed.  Constitutional:      Appearance: Normal appearance. He is not ill-appearing.  Cardiovascular:     Rate and Rhythm: Normal rate and regular rhythm.     Pulses: Normal pulses.     Heart sounds: Normal heart sounds. No murmur heard.   Pulmonary:     Effort: Pulmonary effort is normal. No respiratory distress.     Breath sounds: Normal breath sounds. No wheezing, rhonchi or rales.  Neurological:     Mental Status: He is alert.  Psychiatric:        Mood and Affect: Mood normal.        Behavior: Behavior normal.     Comments: Cooperative with exam       Results for orders placed or performed in visit on 02/01/21  CUP PACEART REMOTE DEVICE CHECK  Result Value Ref Range   Date Time Interrogation Session [id number]    Pulse Generator Manufacturer MERM    Pulse Gen Model [id number] Reveal LINQ    Pulse Gen Serial Number [id number]    Clinic Name [facility]    Implantable Pulse Generator Type ICM/ILR    Implantable Pulse Generator Implant Date [id number]    Eval Rhythm SR at 78 bpm/frequent PVCs    Assessment & Plan:  This visit occurred during the SARS-CoV-2 public health emergency.  Safety protocols were in place, including screening questions prior to the visit, additional usage of staff PPE, and  extensive cleaning of exam room while observing appropriate contact time as indicated for disinfecting solutions.   Problem List Items Addressed This Visit    Dementia with behavioral disturbance ([place]) - Primary    Alz vs FTD. Has benefited from addition of sertraline for disinhibitions. Discussed if truly FTD may not be benefiting from namenda and could trial off - given overall stable period, will not make med change at this time. Continue current regimen.       Relevant Medications   sertraline (ZOLOFT) 50 MG tablet   Recurrent syncope    No further syncope since midodrine was started. Taking 2-3 times daily      Sexual disinhibition    Improved with sertraline.           Meds ordered this encounter  Medications  . sertraline (ZOLOFT) 50 MG tablet    Sig: Take 1 tablet (50 mg total) by mouth daily.    Dispense:  90 tablet    Refill:  1   No orders of the defined types were placed in this encounter.   Patient Instructions  Continue current medicines. Return in 6 months for wellness visit/physical    Follow up plan: Return in about 6 months (around 08/16/2021) for annual exam, prior fasting for blood work, medicare wellness visit.  [name], MD

## 2021-02-14 NOTE — Assessment & Plan Note (Signed)
No further syncope since midodrine was started. Taking 2-3 times daily

## 2021-02-14 NOTE — Assessment & Plan Note (Signed)
Alz vs FTD. Has benefited from addition of sertraline for disinhibitions. Discussed if truly FTD may not be benefiting from namenda and could trial off - given overall stable period, will not make med change at this time. Continue current regimen.

## 2021-02-14 NOTE — Patient Instructions (Signed)
Continue current medicines. Return in 6 months for wellness visit/physical

## 2021-02-14 NOTE — Assessment & Plan Note (Signed)
Improved with sertraline.

## 2021-03-05 ENCOUNTER — Ambulatory Visit (INDEPENDENT_AMBULATORY_CARE_PROVIDER_SITE_OTHER): Payer: Medicare Other

## 2021-03-05 DIAGNOSIS — R55 Syncope and collapse: Secondary | ICD-10-CM | POA: Diagnosis not present

## 2021-03-05 LAB — CUP PACEART REMOTE DEVICE CHECK
Date Time Interrogation Session: 20220424041134
Implantable Pulse Generator Implant Date: 20191120

## 2021-03-17 ENCOUNTER — Other Ambulatory Visit: Payer: Self-pay | Admitting: Family Medicine

## 2021-03-22 NOTE — Progress Notes (Signed)
Carelink Summary Report / Loop Recorder 

## 2021-04-04 LAB — CUP PACEART REMOTE DEVICE CHECK
Date Time Interrogation Session: 20220524125429
Implantable Pulse Generator Implant Date: 20191120

## 2021-04-05 ENCOUNTER — Ambulatory Visit (INDEPENDENT_AMBULATORY_CARE_PROVIDER_SITE_OTHER): Payer: Medicare Other

## 2021-04-05 ENCOUNTER — Telehealth: Payer: Self-pay | Admitting: Emergency Medicine

## 2021-04-05 DIAGNOSIS — R55 Syncope and collapse: Secondary | ICD-10-CM

## 2021-04-05 LAB — CUP PACEART REMOTE DEVICE CHECK
Date Time Interrogation Session: 20220526000500
Implantable Pulse Generator Implant Date: 20191120

## 2021-04-05 NOTE — Telephone Encounter (Signed)
Attempted to contact patient in reference to multiple [facility]  Alerts received for brady episodes. Patient has loop recorder placed for syncope. Spoke with patient's DPR [name] who states that patient has advance dementia and is unable to make decisions or answer questions. (spouse) DPR [name] states that patient has not been sick but has been slower than usual. The time frame corollates with some of the brady episodes recorded. I advised DPR that I would send transmission to Dr. Curt Bears for review.

## 2021-04-06 NOTE — Telephone Encounter (Signed)
Wife reports that pt is NOT symptomatic that she is aware of.  It can be hard to assess/judge with his advanced dementia. States he gets dehydrated quickly and she has to be on top of that. She does not know of any dizziness/light headedness, fatigue/weakeness or other bradycardic symptoms. She will monitor and let office know if symptoms begin/occur.

## 2021-04-30 NOTE — Progress Notes (Signed)
Carelink Summary Report / Loop Recorder 

## 2021-05-04 ENCOUNTER — Emergency Department: Payer: Medicare Other

## 2021-05-04 ENCOUNTER — Inpatient Hospital Stay
Admission: EM | Admit: 2021-05-04 | Discharge: 2021-05-06 | DRG: 641 | Disposition: A | Discharge status: Home / Self Care | Payer: Medicare Other | Attending: Internal Medicine | Admitting: Internal Medicine

## 2021-05-04 ENCOUNTER — Encounter: Payer: Self-pay | Admitting: Intensive Care

## 2021-05-04 ENCOUNTER — Other Ambulatory Visit: Payer: Self-pay

## 2021-05-04 DIAGNOSIS — E785 Hyperlipidemia, unspecified: Secondary | ICD-10-CM | POA: Diagnosis not present

## 2021-05-04 DIAGNOSIS — R55 Syncope and collapse: Secondary | ICD-10-CM | POA: Diagnosis present

## 2021-05-04 DIAGNOSIS — I959 Hypotension, unspecified: Secondary | ICD-10-CM | POA: Diagnosis not present

## 2021-05-04 DIAGNOSIS — K5641 Fecal impaction: Secondary | ICD-10-CM | POA: Diagnosis not present

## 2021-05-04 DIAGNOSIS — F03911 Unspecified dementia, unspecified severity, with agitation: Secondary | ICD-10-CM | POA: Diagnosis present

## 2021-05-04 DIAGNOSIS — N179 Acute kidney failure, unspecified: Secondary | ICD-10-CM | POA: Diagnosis not present

## 2021-05-04 DIAGNOSIS — N1831 Chronic kidney disease, stage 3a: Secondary | ICD-10-CM | POA: Diagnosis present

## 2021-05-04 DIAGNOSIS — I739 Peripheral vascular disease, unspecified: Secondary | ICD-10-CM | POA: Diagnosis not present

## 2021-05-04 DIAGNOSIS — Z79899 Other long term (current) drug therapy: Secondary | ICD-10-CM

## 2021-05-04 DIAGNOSIS — Z87891 Personal history of nicotine dependence: Secondary | ICD-10-CM

## 2021-05-04 DIAGNOSIS — R404 Transient alteration of awareness: Secondary | ICD-10-CM | POA: Diagnosis not present

## 2021-05-04 DIAGNOSIS — Z806 Family history of leukemia: Secondary | ICD-10-CM | POA: Diagnosis not present

## 2021-05-04 DIAGNOSIS — D6959 Other secondary thrombocytopenia: Secondary | ICD-10-CM | POA: Diagnosis not present

## 2021-05-04 DIAGNOSIS — R809 Proteinuria, unspecified: Secondary | ICD-10-CM | POA: Diagnosis present

## 2021-05-04 DIAGNOSIS — G309 Alzheimer's disease, unspecified: Secondary | ICD-10-CM | POA: Diagnosis present

## 2021-05-04 DIAGNOSIS — I951 Orthostatic hypotension: Secondary | ICD-10-CM | POA: Diagnosis present

## 2021-05-04 DIAGNOSIS — I452 Bifascicular block: Secondary | ICD-10-CM | POA: Diagnosis not present

## 2021-05-04 DIAGNOSIS — K5649 Other impaction of intestine: Secondary | ICD-10-CM | POA: Diagnosis present

## 2021-05-04 DIAGNOSIS — E538 Deficiency of other specified B group vitamins: Secondary | ICD-10-CM | POA: Diagnosis not present

## 2021-05-04 DIAGNOSIS — E860 Dehydration: Principal | ICD-10-CM | POA: Diagnosis present

## 2021-05-04 DIAGNOSIS — Z8249 Family history of ischemic heart disease and other diseases of the circulatory system: Secondary | ICD-10-CM

## 2021-05-04 DIAGNOSIS — F0391 Unspecified dementia with behavioral disturbance: Secondary | ICD-10-CM | POA: Diagnosis present

## 2021-05-04 DIAGNOSIS — Z85828 Personal history of other malignant neoplasm of skin: Secondary | ICD-10-CM | POA: Diagnosis not present

## 2021-05-04 DIAGNOSIS — I9589 Other hypotension: Secondary | ICD-10-CM | POA: Diagnosis present

## 2021-05-04 DIAGNOSIS — R61 Generalized hyperhidrosis: Secondary | ICD-10-CM | POA: Diagnosis not present

## 2021-05-04 DIAGNOSIS — Z20822 Contact with and (suspected) exposure to covid-19: Secondary | ICD-10-CM | POA: Diagnosis present

## 2021-05-04 DIAGNOSIS — D696 Thrombocytopenia, unspecified: Secondary | ICD-10-CM | POA: Diagnosis not present

## 2021-05-04 DIAGNOSIS — F0390 Unspecified dementia without behavioral disturbance: Secondary | ICD-10-CM | POA: Diagnosis not present

## 2021-05-04 DIAGNOSIS — K5900 Constipation, unspecified: Secondary | ICD-10-CM | POA: Diagnosis not present

## 2021-05-04 DIAGNOSIS — I4510 Unspecified right bundle-branch block: Secondary | ICD-10-CM | POA: Diagnosis not present

## 2021-05-04 DIAGNOSIS — R231 Pallor: Secondary | ICD-10-CM | POA: Diagnosis not present

## 2021-05-04 DIAGNOSIS — E559 Vitamin D deficiency, unspecified: Secondary | ICD-10-CM | POA: Diagnosis present

## 2021-05-04 DIAGNOSIS — Z743 Need for continuous supervision: Secondary | ICD-10-CM | POA: Diagnosis not present

## 2021-05-04 DIAGNOSIS — R509 Fever, unspecified: Secondary | ICD-10-CM | POA: Diagnosis not present

## 2021-05-04 DIAGNOSIS — F03918 Unspecified dementia, unspecified severity, with other behavioral disturbance: Secondary | ICD-10-CM | POA: Diagnosis present

## 2021-05-04 DIAGNOSIS — G9389 Other specified disorders of brain: Secondary | ICD-10-CM | POA: Diagnosis not present

## 2021-05-04 DIAGNOSIS — F0281 Dementia in other diseases classified elsewhere with behavioral disturbance: Secondary | ICD-10-CM | POA: Diagnosis present

## 2021-05-04 DIAGNOSIS — I472 Ventricular tachycardia: Secondary | ICD-10-CM | POA: Diagnosis not present

## 2021-05-04 LAB — CBC WITH DIFFERENTIAL/PLATELET
Abs Immature Granulocytes: 0.05 10*3/uL (ref 0.00–0.07)
Basophils Absolute: 0.1 10*3/uL (ref 0.0–0.1)
Basophils Relative: 1 %
Eosinophils Absolute: 0 10*3/uL (ref 0.0–0.5)
Eosinophils Relative: 0 %
HCT: 45.7 % (ref 39.0–52.0)
Hemoglobin: 16.3 g/dL (ref 13.0–17.0)
Immature Granulocytes: 1 %
Lymphocytes Relative: 12 %
Lymphs Abs: 1.2 10*3/uL (ref 0.7–4.0)
MCH: 32.8 pg (ref 26.0–34.0)
MCHC: 35.7 g/dL (ref 30.0–36.0)
MCV: 92 fL (ref 80.0–100.0)
Monocytes Absolute: 0.8 10*3/uL (ref 0.1–1.0)
Monocytes Relative: 8 %
Neutro Abs: 7.7 10*3/uL (ref 1.7–7.7)
Neutrophils Relative %: 78 %
Platelets: 116 10*3/uL — ABNORMAL LOW (ref 150–400)
RBC: 4.97 MIL/uL (ref 4.22–5.81)
RDW: 12.8 % (ref 11.5–15.5)
WBC: 9.8 10*3/uL (ref 4.0–10.5)
nRBC: 0 % (ref 0.0–0.2)

## 2021-05-04 LAB — COMPREHENSIVE METABOLIC PANEL
ALT: 19 U/L (ref 0–44)
AST: 27 U/L (ref 15–41)
Albumin: 4.2 g/dL (ref 3.5–5.0)
Alkaline Phosphatase: 70 U/L (ref 38–126)
Anion gap: 10 (ref 5–15)
BUN: 19 mg/dL (ref 8–23)
CO2: 27 mmol/L (ref 22–32)
Calcium: 9.4 mg/dL (ref 8.9–10.3)
Chloride: 103 mmol/L (ref 98–111)
Creatinine, Ser: 1.49 mg/dL — ABNORMAL HIGH (ref 0.61–1.24)
GFR, Estimated: 46 mL/min — ABNORMAL LOW (ref >60)
Glucose, Bld: 119 mg/dL — ABNORMAL HIGH (ref 70–99)
Potassium: 4.2 mmol/L (ref 3.5–5.1)
Sodium: 140 mmol/L (ref 135–145)
Total Bilirubin: 1.2 mg/dL (ref 0.3–1.2)
Total Protein: 7.1 g/dL (ref 6.5–8.1)

## 2021-05-04 LAB — BASIC METABOLIC PANEL
Anion gap: 10 (ref 5–15)
BUN: 19 mg/dL (ref 8–23)
CO2: 24 mmol/L (ref 22–32)
Calcium: 8.8 mg/dL — ABNORMAL LOW (ref 8.9–10.3)
Chloride: 105 mmol/L (ref 98–111)
Creatinine, Ser: 1.39 mg/dL — ABNORMAL HIGH (ref 0.61–1.24)
GFR, Estimated: 50 mL/min — ABNORMAL LOW (ref >60)
Glucose, Bld: 150 mg/dL — ABNORMAL HIGH (ref 70–99)
Potassium: 3.7 mmol/L (ref 3.5–5.1)
Sodium: 139 mmol/L (ref 135–145)

## 2021-05-04 LAB — RESP PANEL BY RT-PCR (FLU A&B, COVID) ARPGX2
Influenza A by PCR: NEGATIVE
Influenza B by PCR: NEGATIVE
SARS Coronavirus 2 by RT PCR: NEGATIVE

## 2021-05-04 LAB — TROPONIN I (HIGH SENSITIVITY)
Troponin I (High Sensitivity): 7 ng/L (ref <18)
Troponin I (High Sensitivity): 6 ng/L (ref <18)

## 2021-05-04 LAB — PROTIME-INR
INR: 1.1 (ref 0.8–1.2)
Prothrombin Time: 14 seconds (ref 11.4–15.2)

## 2021-05-04 MED ORDER — FLEET ENEMA 7-19 GM/118ML RE ENEM
1.0000 | ENEMA | Freq: Once | RECTAL | Status: AC
  Administered 2021-05-04: 1 via RECTAL

## 2021-05-04 MED ORDER — DOCUSATE SODIUM 100 MG PO CAPS
100.0000 mg | ORAL_CAPSULE | Freq: Two times a day (BID) | ORAL | Status: DC
  Administered 2021-05-04 – 2021-05-06 (×3): 100 mg via ORAL
  Filled 2021-05-04 (×5): qty 1

## 2021-05-04 MED ORDER — CYANOCOBALAMIN 500 MCG PO TABS
500.0000 ug | ORAL_TABLET | Freq: Every day | ORAL | Status: DC
  Administered 2021-05-04 – 2021-05-06 (×3): 500 ug via ORAL
  Filled 2021-05-04 (×3): qty 1

## 2021-05-04 MED ORDER — BISACODYL 10 MG RE SUPP
10.0000 mg | Freq: Once | RECTAL | Status: AC
  Administered 2021-05-04: 10 mg via RECTAL
  Filled 2021-05-04: qty 1

## 2021-05-04 MED ORDER — ENOXAPARIN SODIUM 40 MG/0.4ML IJ SOSY
40.0000 mg | PREFILLED_SYRINGE | INTRAMUSCULAR | Status: DC
  Administered 2021-05-05 – 2021-05-06 (×2): 40 mg via SUBCUTANEOUS
  Filled 2021-05-04 (×2): qty 0.4

## 2021-05-04 MED ORDER — LACTULOSE 10 GM/15ML PO SOLN
20.0000 g | Freq: Once | ORAL | Status: AC
  Administered 2021-05-04: 20 g via ORAL
  Filled 2021-05-04: qty 30

## 2021-05-04 MED ORDER — HYDROCODONE-ACETAMINOPHEN 5-325 MG PO TABS: 1.0000 | ORAL_TABLET | ORAL | Status: DC | PRN

## 2021-05-04 MED ORDER — ACETAMINOPHEN 325 MG PO TABS: 650.0000 mg | ORAL_TABLET | Freq: Four times a day (QID) | ORAL | Status: DC | PRN

## 2021-05-04 MED ORDER — SENNA 8.6 MG PO TABS
1.0000 | ORAL_TABLET | Freq: Two times a day (BID) | ORAL | Status: DC
  Administered 2021-05-04 – 2021-05-06 (×3): 8.6 mg via ORAL
  Filled 2021-05-04 (×5): qty 1

## 2021-05-04 MED ORDER — SERTRALINE HCL 50 MG PO TABS
50.0000 mg | ORAL_TABLET | Freq: Every day | ORAL | Status: DC
  Administered 2021-05-04 – 2021-05-06 (×3): 50 mg via ORAL
  Filled 2021-05-04 (×3): qty 1

## 2021-05-04 MED ORDER — SODIUM CHLORIDE 0.9 % IV BOLUS
500.0000 mL | Freq: Once | INTRAVENOUS | Status: AC
  Administered 2021-05-04: 500 mL via INTRAVENOUS

## 2021-05-04 MED ORDER — ACETAMINOPHEN 650 MG RE SUPP: 650.0000 mg | Freq: Four times a day (QID) | RECTAL | Status: DC | PRN

## 2021-05-04 MED ORDER — MORPHINE SULFATE (PF) 2 MG/ML IV SOLN: 2.0000 mg | INTRAVENOUS | Status: DC | PRN

## 2021-05-04 MED ORDER — MIDODRINE HCL 5 MG PO TABS
2.5000 mg | ORAL_TABLET | Freq: Three times a day (TID) | ORAL | Status: DC
  Administered 2021-05-05: 09:00:00 2.5 mg via ORAL
  Filled 2021-05-04: qty 1

## 2021-05-04 MED ORDER — ONDANSETRON HCL 4 MG PO TABS: 4.0000 mg | ORAL_TABLET | Freq: Four times a day (QID) | ORAL | Status: DC | PRN

## 2021-05-04 MED ORDER — LACTATED RINGERS IV SOLN: INTRAVENOUS | Status: DC

## 2021-05-04 MED ORDER — MAGNESIUM CITRATE PO SOLN
1.0000 | Freq: Once | ORAL | Status: DC | PRN
  Filled 2021-05-04: qty 296

## 2021-05-04 MED ORDER — MIDODRINE HCL 5 MG PO TABS
2.5000 mg | ORAL_TABLET | Freq: Once | ORAL | Status: AC
  Administered 2021-05-04: 2.5 mg via ORAL
  Filled 2021-05-04: qty 1

## 2021-05-04 MED ORDER — VITAMIN D 25 MCG (1000 UNIT) PO TABS
1000.0000 [IU] | ORAL_TABLET | Freq: Every day | ORAL | Status: DC
  Administered 2021-05-04 – 2021-05-06 (×3): 1000 [IU] via ORAL
  Filled 2021-05-04 (×3): qty 1

## 2021-05-04 MED ORDER — ONDANSETRON HCL 4 MG/2ML IJ SOLN: 4.0000 mg | Freq: Four times a day (QID) | INTRAMUSCULAR | Status: DC | PRN

## 2021-05-04 MED ORDER — MEMANTINE HCL ER 14 MG PO CP24
14.0000 mg | ORAL_CAPSULE | Freq: Every day | ORAL | Status: DC
  Administered 2021-05-05 – 2021-05-06 (×2): 14 mg via ORAL
  Filled 2021-05-04 (×3): qty 1

## 2021-05-04 NOTE — ED Triage Notes (Signed)
Patient arrived by EMS from home where he lives with spouse. Wife called EMS due to episode of shaking, diaphoresis, and cool to touch. Denies LOC. Patient denies pain. HX dementia. Wife reports patient was at baseline once EMS arrived. Denies change in medications. Denies speech or response concerns

## 2021-05-04 NOTE — ED Notes (Signed)
This NT assisted pt to bedside commode.  Family member present.

## 2021-05-04 NOTE — ED Notes (Signed)
Caregiver back to bedside. Pt shifted back into his room. Will complete enema soon.

## 2021-05-04 NOTE — ED Notes (Signed)
Once pt back in bed this RN will complete [employer] interrogation as requested by provider. Pt remains on bedside commode per pt's choice.

## 2021-05-04 NOTE — ED Notes (Signed)
This NT placed pt on bed pan.

## 2021-05-04 NOTE — ED Notes (Signed)
[employer] contacted to fax loop linq device activity on patient today.

## 2021-05-04 NOTE — ED Notes (Signed)
Pt continues to sit on bedside commode. Caregiver/visitor remains with pt.

## 2021-05-04 NOTE — ED Provider Notes (Signed)
[facility] Emergency Department Provider Note  ____________________________________________   Event Date/Time   First MD Initiated Contact with Patient 05/04/21 1007     (approximate)  I have reviewed the triage vital signs and the nursing notes.   HISTORY  Chief Complaint Near Syncope    HPI Dylan [name] is a 87 y.o. male with Alzheimer's, recurrent syncope on midodrine who comes in for possible syncope.  Patient is coming from home where he lives with his wife.  Wife is at bedside who stated that he was sitting on the toilet trying to have a bowel movement and was not able to for 30 minutes.  She then took him to the shower he was able to shower and was acting his normal self.  She then had him sit back down on the toilet and during the episode he started getting really pale, diaphoretic and was less responsive for about 5 minutes.  Never truly lost consciousness or blacked out or fell down off the toilet.  He does have some baseline shaking of his right arm but she did not feel like the shaking was any worse than normal.  Does not think it was a seizure.  She reports that this lasted about 5 minutes and then he came back close to his baseline and he was able to walk to the bed.  She stated that she walked away and he had walked himself back to the bathroom where when she came back he had another episode of paleness,  shakiness, diaphoresis.  This lasted until EMS got there.  He did not fall off the toilet did not hit his head.  Per EMS he did have normal brown stool in the toilet.  He does have a history of syncope which she takes midodrine for he is got a device in place from [employer].  She has not given him his midodrine yet  Altered mental status episode  have occurred x2, unclear what brought it on possible bowel movements, better on its own.    On review of records patient had a 14-day monitor that showed PACs but no symptomatic bradycardia.  He is now  status post L IN Q monitor implant.  He has known right bundle branch block.  It appears he is also had some episodes of SVT on his monitors but they are not associated with his symptoms typically.          Past Medical History:  Diagnosis Date   Basal cell carcinoma 08/31/2020   Mid forehead - ED&C    Basal cell carcinoma of right nasal sidewall 12/15/2014   Bilateral hearing loss 08/03/2018   S/p audiology evaluation, has new hearing aides.   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/2014   Medicare annual wellness visit, subsequent 08/11/2019   Mohs defect of eyelid 12/14/2014   Nuclear sclerosis, left 04/01/2017   Obstruction of right lacrimal duct 10/16/2015   PAD (peripheral artery disease) ([place]) 03/21/2020  Abnormal ABI, abnormal arterial duplex 03/2020:  R - moderate RLE disease, abnormal TBI L - WNL, abnormal TBI Referred for vascular consult - rec medical management   Pruritus 02/19/2020   Recurrent syncope 06/22/2018   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   Right leg swelling 02/19/2020   Sepsis ([place]) 05/30/2020   Thrombocytopenia ([place]) 02/21/2011   periph smear stable 02/2020   Vitamin B12 deficiency 08/03/2018   Completed b12 shots 09/2018, maintaining levels on oral replacement (dissolvable tablets) 01/2019   Vitamin D deficiency 05/19/2018    Patient Active Problem List   Diagnosis Date Noted   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   PAD (peripheral artery  disease) ([place]) 03/21/2020   Right leg swelling 02/19/2020   Dysphagia 02/19/2020   Pruritus 02/19/2020   Medicare annual wellness visit, subsequent 08/11/2019   CKD (chronic kidney disease) stage 3, GFR 30-59 ml/min ([place]) 01/13/2019   Health maintenance examination 08/03/2018   Advanced care planning/counseling discussion 08/03/2018   Vitamin B12 deficiency 08/03/2018   Bilateral hearing loss 08/03/2018   Recurrent syncope 06/22/2018   Vitamin D deficiency 05/19/2018   Fall 05/19/2018   Dementia with behavioral disturbance ([place]) 05/19/2018   Cataract extraction status 03/06/2017   Obstruction of right lacrimal duct 10/16/2015   Exposure keratopathy, 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   Eversion of right lacrimal punctum 05/17/2015   Epiphora due to insufficient drainage of right side 05/17/2015   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   Basal cell carcinoma of right nasal sidewall 12/15/2014   Mohs defect of tear duct of right eye 12/15/2014   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/2014   Mohs defect of eyelid 12/14/2014   Thrombocytopenia ([place]) 02/21/2011   Dyslipidemia 02/04/2008   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   ERECTILE DYSFUNCTION, MILD 02/03/2007   HYDROCELE, LEFT 02/03/2007    Past Surgical History:  Procedure Laterality Date   BASAL CELL CARCINOMA EXCISION Right 01/2015   with reconstructive surgery around eye   CATARACT EXTRACTION Left 03/2017   ETT  09/2018   negative for ischemia, extremely poor exercise tolerance ([place])   FOOT SURGERY Left 1998   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 Authorizing Provider  Cholecalciferol (VITAMIN D3) 1000 units CAPS Take 1 capsule (1,000 Units total) by mouth daily. 08/03/18   [name], MD  memantine (NAMENDA XR) 14 MG CP24 24 hr capsule Take 1 capsule (14 mg total) by mouth daily. 08/16/20   [name], MD  midodrine (PROAMATINE) 2.5 MG tablet Take 1 tablet (2.5 mg total) by mouth 3 (three) times daily with meals. 08/16/20   [name], MD  sertraline (ZOLOFT) 50 MG tablet Take 1 tablet (50 mg total) by mouth daily. 02/14/21   [name], MD  vitamin B-12 (V-R VITAMIN B-12) 500 MCG tablet Take 1 tablet (500 mcg total) by mouth daily. 08/11/19   [name], MD    Allergies Doxepin and Lincomycin hcl  Family History  Problem Relation Age of Onset   Congestive Heart Failure Mother    Stroke Father 24   Congestive Heart Failure Father  Cataracts Sister    Leukemia Brother     Social History Social History   Tobacco Use   Smoking status: Former    Pack years: 0.00    Types: Cigarettes    Quit date: 11/11/1960    Years since quitting: 60.5   Smokeless tobacco: Never  Vaping Use   Vaping Use: Never used  Substance Use Topics   Alcohol use: No   Drug use: No      Review of Systems Constitutional: No fever/chills, diaphoresis, paleness Eyes: No visual changes. ENT: No sore throat. Cardiovascular: Denies chest pain.  Less responsiveness episode Respiratory: Denies shortness of breath. Gastrointestinal: No abdominal pain.  No nausea, no vomiting.  No diarrhea.  No constipation. Genitourinary: Negative for dysuria. Musculoskeletal: Negative for back pain. Skin: Negative for rash. Neurological: Negative for headaches, focal weakness or numbness. All other ROS negative ____________________________________________   PHYSICAL EXAM:  VITAL SIGNS: Blood pressure 111/70, pulse 77, temperature (!) 97.4 F (36.3 C), temperature source Axillary, resp. rate (!) 21, height 6\' 1"  (1.854 m),  weight 81.4 kg, SpO2 100 %.   Constitutional: Alert and oriented x2. Well appearing and in no acute distress. Eyes: Conjunctivae are normal. EOMI. Head: Atraumatic. Nose: No congestion/rhinnorhea. Mouth/Throat: Mucous membranes are moist.   Neck: No stridor. Trachea Midline. FROM Cardiovascular: Normal rate, regular rhythm. Grossly normal heart sounds.  Good peripheral circulation. Respiratory: Normal respiratory effort.  No retractions. Lungs CTAB. Gastrointestinal: Soft and nontender. No distention. No abdominal bruits.  Musculoskeletal: No lower extremity tenderness nor edema.  No joint effusions. Neurologic:  Normal speech and language. No gross focal neurologic deficits are appreciated.  Mild intention tremor noted. Skin:  Skin is warm, dry and intact. No rash noted. Psychiatric: Mood and affect are normal. Speech and behavior are normal. GU: Deferred   ____________________________________________   LABS (all labs ordered are listed, but only abnormal results are displayed)  Labs Reviewed  CBC WITH DIFFERENTIAL/PLATELET  COMPREHENSIVE METABOLIC PANEL  URINALYSIS, COMPLETE (UACMP) WITH MICROSCOPIC  TROPONIN I (HIGH SENSITIVITY)   ____________________________________________   ED ECG REPORT I, Vanessa [place], the attending physician, personally viewed and interpreted this ECG.  EKG is sinus bradycardia rate of 55, no ST elevation, some T wave flattening in lead III, occasional PVC, right bundle branch block ____________________________________________  RADIOLOGY I, Vanessa Menan, personally viewed and evaluated these images (plain radiographs) as part of my medical decision making, as well as reviewing the written report by the radiologist.  ED MD interpretation: No pneumonia  Official radiology report(s): DG Chest 2 View  Result Date: 05/04/2021 CLINICAL DATA:  Syncope per ordering notes. Wife called EMS due to episode of shaking, diaphoresis, and cool to touch. Denies  LOC. Patient denies pain. Hx dementia. Wife reports patient was at baseline once EMS arrived. Denies change in medications. EXAM: CHEST - 2 VIEW COMPARISON:  05/30/2020 FINDINGS: Cardiac silhouette is normal in size and configuration. Normal mediastinal and hilar contours. Clear lungs.  No pleural effusion or pneumothorax. Stable pectus excavatum.  Skeletal structures are intact. IMPRESSION: No active cardiopulmonary disease. Electronically Signed   By: Lajean Manes M.D.   On: 05/04/2021 11:45   CT Head Wo Contrast  Result Date: 05/04/2021 CLINICAL DATA:  Patient arrived by EMS from home where he lives with spouse. Wife called EMS due to episode of shaking, diaphoresis, and cool to touch. Denies LOC. Patient denies pain. HX dementia EXAM: CT HEAD WITHOUT CONTRAST TECHNIQUE: Contiguous axial images were obtained from the base  of the skull through the vertex without intravenous contrast. COMPARISON:  06/15/2020 FINDINGS: Brain: No evidence of acute infarction, hemorrhage, hydrocephalus, extra-axial collection or mass lesion/mass effect. Mild diffuse atrophy. Periventricular white matter hypoattenuation consistent with mild chronic microvascular ischemic change. Vascular: No hyperdense vessel or unexpected calcification. Skull: Normal. Negative for fracture or focal lesion. Sinuses/Orbits: Visualized globes and orbits are unremarkable. Visualized sinuses are clear. Other: None. IMPRESSION: 1. No acute intracranial abnormalities. 2. Mild atrophy and chronic microvascular ischemic change. Stable appearance from the prior study. Electronically Signed   By: Lajean Manes M.D.   On: 05/04/2021 11:33    ____________________________________________   PROCEDURES  Procedure(s) performed (including Critical Care):  Procedures  ------------------------------------------------------------------------------------------------------------------- Fecal Disimpaction Procedure Note:  Performed by me:  Patient placed  in the lateral recumbent position with knees drawn towards chest. Nurse present for patient support. Large amount of hard brown stool removed. No complications during procedure.   ------------------------------------------------------------------------------------------------------------------   ____________________________________________   INITIAL IMPRESSION / ASSESSMENT AND PLAN / ED COURSE  Langston [place] Grenfell was evaluated in Emergency Department on 05/04/2021 for the symptoms described in the history of present illness. He was evaluated in the context of the global COVID-19 pandemic, which necessitated consideration that the patient might be at risk for infection with the SARS-CoV-2 virus that causes COVID-19. Institutional protocols and algorithms that pertain to the evaluation of patients at risk for COVID-19 are in a state of rapid change based on information released by regulatory bodies including the CDC and federal and state organizations. These policies and algorithms were followed during the patient's care in the ED.    Patient is an 87 year old who comes in with possible syncope versus seizure episode.  Sounds more like vasovagal syncope and patient has a history of syncope in the past he takes midodrine for.  However given he does have a monitor will make sure to get this interrogated, get cardiac markers to evaluate for ACS get labs to evaluate for Electra illness, AKI.  He denies any abdominal pain his abdomen is soft and nontender he has had no vomiting.  I did do a rectal exam but did show a ton of stool in the rectal vault.  I suspect that he has a fecal impaction and that he could be straining which is causing him to have syncopal episodes.  I manually disimpacted and we will trial a suppository and some lactulose to see if we can help him produce a bowel movement here.  We will continue to monitor on the cardiac monitor   Labs are reassuring.  Cardiac marker is negative.  His kidney  function slightly elevated so we will give him a little bit of fluids.   12:59 PM reevaluated patient updated on results.  So far work-up is reassuring.  Patient is wife states that he had another episode where he got white and really diaphoretic also in the toilet.  She is adamant that he was not pushing or trying to have a bowel movement.  Patient did have a small amount of stool output  Patient's device interrogation was normal.  Nothing this is from a arrhythmia.  However I did review his never had an echo.  Discuss further with the patient's wife who states that he is never had episodes like this where he goes unresponsive and diaphoretic.  Does not really sound like a seizure but consider that versus potential syncopal versus vasovagal but at this time family would prefer admission for further work-up and evaluation  ____________________________________________   FINAL CLINICAL IMPRESSION(S) / ED DIAGNOSES   Final diagnoses:  Syncope, unspecified syncope type  Constipation, unspecified constipation type      MEDICATIONS GIVEN DURING THIS VISIT:  Medications  sodium phosphate (FLEET) 7-19 GM/118ML enema 1 enema (has no administration in time range)  bisacodyl (DULCOLAX) suppository 10 mg (10 mg Rectal Given 05/04/21 1230)  lactulose (CHRONULAC) 10 GM/15ML solution 20 g (20 g Oral Given 05/04/21 1158)  midodrine (PROAMATINE) tablet 2.5 mg (2.5 mg Oral Given 05/04/21 1157)  sodium chloride 0.9 % bolus 500 mL (500 mLs Intravenous New Bag/Given 05/04/21 1225)     ED Discharge Orders     None        Note:  This document was prepared using Dragon voice recognition software and may include unintentional dictation errors.    Vanessa Hudson, MD 05/04/21 1425

## 2021-05-04 NOTE — H&P (Signed)
History and Physical    Dylan [name] ZLD:[id number] DOB: 12-Aug-1934 DOA: 05/04/2021  PCP: [name], MD  Patient coming from: Home  I have personally briefly reviewed patient's old medical records in [facility].  Chief Complaint: diaphoresis, episode of almost passing out, and feeling cold  HPI: Dylan [name] is a 87 y.o. male with medical history significant for thrombocytopenia, chronic kidney disease stage 3, dementia, who presents to the emergency department on 05/04/2021 with diaphoresis, episode of almost passing out, and feeling cold.  Patient has fairly severe dementia, so history is obtained primarily from the patient's wife at bedside; patient does provide some information and answers to yes/no questions.  Onset of patient's symptoms was on the morning of admission and duration is intermittent. He was his usual self when he woke up. His wife took him to the bathroom and sat him down on the toilet but he was unable to void or have a bowel move. He took a shower, went to the other room to get his clothes on, but then he said he had to go to the bathroom and that he had to have a bowel movement. He sat on the toilet again, but this time he was gripping the handles next to the toilet, he was white/pale, clammy, soaking wet with sweat. His head was slumped over but his eyes were open. At first he did not respond to his wife at all and that was just for a few seconds. He had a blank stare. Then he said he could not get up. Eventually he was able to get up and went back to the bedroom. He has a heart monitor and his wife put it on and then sent off the data but had trouble with the device. He then went back to the bathroom and went back to the toilet. He had all the same symptoms during the second episode but the symptoms were not as severe.He seemed like he almost passed out but he did not pass out. For the first episode his wife got some frozen vegetables and put the pack on his neck  and he said that made him feel better. Associated symptoms: He never complains of pain. During the episodes and before/after he did not complain of chest pain, abdominal pain. He has had bloating. No stool output so likely constipation. No shortness of breath. No focal weakness or numbness, headache, facial droop, or slurred speech. At baseline he is not very talkative. Wife states that he has never had any similar episodes previously, although he has had syncopal episodes in the past.  Symptoms are alleviated by nothing and exacerbated by nothing. Treatments: he takes midodrine 2 to 3 times per day with meals: if he eatonly 2 meals from getting up late then he has just 2 tabs per day.The day before admission he had 2 meals so 2 midodrine. The day before admission he did not eat or drink much.    ED Course: In the emergency department, eventually he did urinate and also had a bowel movement and it was not bloody.  He had another episode like the ones he had at home.  Urinalysis had no leukocytes but did have 100 protein.  Head CT showed no acute abnormality.  Review of Systems: As per HPI otherwise all other systems reviewed and are negative.  MUSCULOSKELETAL: no new joint pain or joint swelling.     Past Medical History:  Diagnosis Date   Basal cell carcinoma 08/31/2020  Mid forehead - ED&C    Basal cell carcinoma of right nasal sidewall 12/15/2014   Bilateral hearing loss 08/03/2018   S/p audiology evaluation, has new hearing aides.   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   HYDROCELE, LEFT 02/03/2007   Hyperlipidemia  12/1999   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/2014   Medicare annual wellness visit, subsequent 08/11/2019   Mohs defect of eyelid 12/14/2014   Nuclear sclerosis, left 04/01/2017   Obstruction of right lacrimal duct 10/16/2015   PAD (peripheral artery disease) ([place]) 03/21/2020   Abnormal ABI, abnormal arterial duplex 03/2020:  R - moderate RLE disease, abnormal TBI L - WNL, abnormal TBI Referred for vascular consult - rec medical management   Pruritus 02/19/2020   Recurrent syncope 06/22/2018   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   Right leg swelling 02/19/2020   Sepsis ([place]) 05/30/2020   Thrombocytopenia ([place]) 02/21/2011   periph smear stable 02/2020   Vitamin B12 deficiency 08/03/2018   Completed b12 shots 09/2018, maintaining levels on oral replacement (dissolvable tablets) 01/2019   Vitamin D deficiency 05/19/2018    Past Surgical History:  Procedure Laterality Date   BASAL CELL CARCINOMA EXCISION Right 01/2015   with reconstructive surgery around eye   CATARACT EXTRACTION Left 03/2017   ETT  09/2018   negative for ischemia, extremely poor exercise tolerance ([place])   FOOT SURGERY Left 1998   metatarsal amputation after injury   KNEE ARTHROSCOPY  1993   Right   KNEE ARTHROSCOPY  01/2001   Left   LOOP RECORDER INSERTION N/A 09/30/2018   Procedure: LOOP RECORDER INSERTION;  Surgeon: Constance Haw, MD;  Location: [facility];  Service: Cardiovascular;  Laterality: N/A;    Social History  reports that he quit smoking about 60 years ago. His smoking use included cigarettes. He has never used smokeless tobacco. He reports that he does not drink alcohol and does not use drugs.  Allergies  Allergen Reactions   Doxepin Other (See Comments)    Lethargy, overly sedating at low dose   Lincomycin Hcl Rash    Family History  Problem Relation Age of Onset   Congestive Heart Failure Mother    Stroke Father 8    Congestive Heart Failure Father    Cataracts Sister    Leukemia Brother      Home Medications  Prior to Admission medications   Medication Sig Start Date End Date Taking? Authorizing Provider  Cholecalciferol (VITAMIN D3) 1000 units CAPS Take 1 capsule (1,000 Units total) by mouth daily. 08/03/18   [name], MD  memantine (NAMENDA XR) 14 MG CP24 24 hr capsule Take 1 capsule (14 mg total) by mouth daily. 08/16/20   [name], MD  midodrine (PROAMATINE) 2.5 MG tablet Take 1 tablet (2.5 mg total) by mouth 3 (three) times daily with meals. 08/16/20   [name], MD  sertraline (ZOLOFT) 50 MG tablet Take 1 tablet (50 mg total) by mouth daily. 02/14/21   [name], MD  vitamin B-12 (V-R VITAMIN B-12) 500  MCG tablet Take 1 tablet (500 mcg total) by mouth daily. 08/11/19   [name], MD    Physical Exam: Vitals:   05/04/21 1315 05/04/21 1345 05/04/21 1353 05/04/21 1354  BP:      Pulse:  (!) 57  64  Resp: 20 14 17    Temp:      TempSrc:      SpO2:  97% 100%   Weight:      Height:        Constitutional: NAD, calm, comfortable, ill-appearing. Vitals:   05/04/21 1315 05/04/21 1345 05/04/21 1353 05/04/21 1354  BP:      Pulse:  (!) 57  64  Resp: 20 14 17    Temp:      TempSrc:      SpO2:  97% 100%   Weight:      Height:       Eyes: PERRL, lids and conjunctivae without icterus or erythema. ENMT: Mucous membranes are dry. Posterior pharynx clear of any exudate or lesions. Nares patent without discharge or bleeding.  Normocephalic, atraumatic.  Normal dentition.  Neck: normal, supple, no masses, trachea midline.  Thyroid nontender, no masses appreciated, no thyromegaly. Respiratory: clear to auscultation bilaterally. Chest wall movements are symmetric. No wheezing, no crackles.  No rhonchi.  Normal respiratory effort. No accessory muscle use.  Cardiovascular: Regular rate and rhythm, no rubs / gallops. Murmur 2/6 systolic. Pulses: DP pulses 2+ bilaterally.  No carotid bruits.  Capillary refill less than 3 seconds. Edema: None bilaterally. GI: soft, non-distended, normal active bowel sounds. No hepatosplenomegaly. No rigidity, rebound, or guarding. Non-tender. No masses palpated. Small amount of formed brown stool on the bedsheet.  Musculoskeletal: no clubbing / cyanosis. No joint deformity upper and lower extremities. Good ROM, no contractures. Normal muscle tone.  No tenderness or deformity in the back bilaterally. Integument: no rashes, lesions, ulcers. No induration. No diaphoresis. Clean, dry, intact. Skin is warm to palpation. Neurologic: CN 2-12 grossly intact. Sensation grossly intact to light touch. DTR 2+ bilaterally.  Babinski: Toes downgoing bilaterally.  Strength 5/5 in all 4.  Intact rapid alternating movements bilaterally.  No pronator drift. Tremor noted in right upper extremity (chronic per wife). Reticent. Psychiatric: Poor judgment and insight. Alert and oriented to person only. Normal mood.  Normal and appropriate affect. Lymphatic: No cervical lymphadenopathy. No supraclavicular lymphadenopathy.   Labs on Admission: I have personally reviewed the following labs and imaging studies.  CBC: Recent Labs  Lab 05/04/21 1028  WBC 9.8  NEUTROABS 7.7  HGB 16.3  HCT 45.7  MCV 92.0  PLT 116*    Basic Metabolic Panel: Recent Labs  Lab 05/04/21 1028  NA 140  K 4.2  CL 103  CO2 27  GLUCOSE 119*  BUN 19  CREATININE 1.49*  CALCIUM 9.4    GFR: Estimated Creatinine Clearance: 41 mL/min (A) (by C-G formula based on SCr of 1.49 mg/dL (H)).  Liver Function Tests: Recent Labs  Lab 05/04/21 1028  AST 27  ALT 19  ALKPHOS 70  BILITOT 1.2  PROT 7.1  ALBUMIN 4.2    Urine analysis:    Component Value Date/Time   COLORURINE AMBER (A) 05/30/2020 2305   APPEARANCEUR HAZY (A) 05/30/2020 2305   LABSPEC 1.030 05/30/2020 2305   PHURINE 5.0 05/30/2020 2305   GLUCOSEU NEGATIVE 05/30/2020 2305   HGBUR NEGATIVE 05/30/2020 2305    BILIRUBINUR NEGATIVE 05/30/2020 2305   KETONESUR 5 (A) 05/30/2020 2305   PROTEINUR 100 (A) 05/30/2020 2305   NITRITE  NEGATIVE 05/30/2020 2305   LEUKOCYTESUR NEGATIVE 05/30/2020 2305    Radiological Exams on Admission: DG Chest 2 View  Result Date: 05/04/2021 CLINICAL DATA:  Syncope per ordering notes. Wife called EMS due to episode of shaking, diaphoresis, and cool to touch. Denies LOC. Patient denies pain. Hx dementia. Wife reports patient was at baseline once EMS arrived. Denies change in medications. EXAM: CHEST - 2 VIEW COMPARISON:  05/30/2020 FINDINGS: Cardiac silhouette is normal in size and configuration. Normal mediastinal and hilar contours. Clear lungs.  No pleural effusion or pneumothorax. Stable pectus excavatum.  Skeletal structures are intact. IMPRESSION: No active cardiopulmonary disease. Electronically Signed   By: Lajean Manes M.D.   On: 05/04/2021 11:45   CT Head Wo Contrast  Result Date: 05/04/2021 CLINICAL DATA:  Patient arrived by EMS from home where he lives with spouse. Wife called EMS due to episode of shaking, diaphoresis, and cool to touch. Denies LOC. Patient denies pain. HX dementia EXAM: CT HEAD WITHOUT CONTRAST TECHNIQUE: Contiguous axial images were obtained from the base of the skull through the vertex without intravenous contrast. COMPARISON:  06/15/2020 FINDINGS: Brain: No evidence of acute infarction, hemorrhage, hydrocephalus, extra-axial collection or mass lesion/mass effect. Mild diffuse atrophy. Periventricular white matter hypoattenuation consistent with mild chronic microvascular ischemic change. Vascular: No hyperdense vessel or unexpected calcification. Skull: Normal. Negative for fracture or focal lesion. Sinuses/Orbits: Visualized globes and orbits are unremarkable. Visualized sinuses are clear. Other: None. IMPRESSION: 1. No acute intracranial abnormalities. 2. Mild atrophy and chronic microvascular ischemic change. Stable appearance from the prior  study. Electronically Signed   By: Lajean Manes M.D.   On: 05/04/2021 11:33    EKG: Independently reviewed. 55 bpm.  Sinus bradycardia.  RBBB and LAFB (old).   Assessment/Plan All of the diagnoses below have been present on admission: Principal Problem:   Pre-syncope Active Problems:   Acute renal failure superimposed on stage 3 chronic kidney disease (HCC)   Right bundle branch block with left anterior fascicular block   Proteinuria   Thrombocytopenia (HCC)   Dementia with behavioral disturbance (HCC)   Constipation    Principal Problem: Pre-syncope Suspect vasovagal event.  Wife witnessed events and they were concerning. Patient does state that he feels very cold and was covering himself up with blankets; it is possible that he is at the onset of an infectious process, but no source of infection has been identified upon admission. Plan: Telemetry.  Investigate any abnormal rhythms.  Give IV fluids.  Monitor for any signs of infection.  Active Problems: Acute renal failure superimposed on stage 3 chronic kidney disease ([place]) Plan: Check labs. Trial of IVF.  Right bundle branch block with left anterior fascicular block Has been present in the past. Plan: Follow up with cardiologist.  Proteinuria Significant proteinuria. Plan: Outpatient follow up with primary care.  Hypotension Patient has chronic hypotension but was not hypotensive in the emergency department. Plan: Continue home midodrine with meals  Constipation Plan: Trial of as needed medications.  Thrombocytopenia (HCC) Chronic.  Plan: Monitor CBC.  Dementia without behavioral disturbance ([place]) Plan: Continue home medications and outpatient follow-up.    DVT prophylaxis: Lovenox.  Code Status:   Full Code Family CommunicationGreggory, [place] [phone]   [phone]    Disposition Plan:   Patient is from:  Home  Anticipated DC to:  Home  Anticipated DC date:  05/05/2021  Anticipated DC  barriers: None  Consults called:  None  Admission status:  Observation   Severity  of Illness: The appropriate patient status for this patient is OBSERVATION. Observation status is judged to be reasonable and necessary in order to provide the required intensity of service to ensure the patient's safety. The patient's presenting symptoms, physical exam findings, and initial radiographic and laboratory data in the context of their medical condition is felt to place them at decreased risk for further clinical deterioration. Furthermore, it is anticipated that the patient will be medically stable for discharge from the hospital within 2 midnights of admission. The following factors support the patient status of observation.   " The patient's presenting symptoms include near syncope with diaphoresis. " The physical exam findings include patient visibly feeling cold. " The initial radiographic and laboratory data are not revealing of cause of patient's symptoms.    Tacey Ruiz MD [facility]  How to contact the [facility] Attending or Consulting provider [place] or covering provider during after hours [place], for this patient?   Check the care team in [facility] and look for a) attending/consulting [facility] provider listed and b) the [facility] team listed Log into [email or web address] and use [place]'s universal password to access. If you do not have the password, please contact the hospital operator. Locate the [facility] provider you are looking for under [facility] and page to a number that you can be directly reached. If you still have difficulty reaching the provider, please page the [facility] (Director on Call) for the Hospitalists listed on amion for assistance.  05/04/2021, 2:32 PM

## 2021-05-04 NOTE — ED Notes (Signed)
This NT assisted pt from bedside commode and with pericare.  Pt had small amount of hard, pebble sized BM.

## 2021-05-04 NOTE — ED Notes (Signed)
Pt remains in hallway as caregiver not yet back to bedside. Once in room again will complete enema.

## 2021-05-04 NOTE — ED Notes (Signed)
NT at bedside.  

## 2021-05-04 NOTE — ED Notes (Signed)
Pt pulled out into hallway near nurses station per verbal from [place] while caregiver making an errand.

## 2021-05-05 ENCOUNTER — Encounter: Payer: Self-pay | Admitting: Internal Medicine

## 2021-05-05 DIAGNOSIS — E538 Deficiency of other specified B group vitamins: Secondary | ICD-10-CM | POA: Diagnosis present

## 2021-05-05 DIAGNOSIS — Z79899 Other long term (current) drug therapy: Secondary | ICD-10-CM | POA: Diagnosis not present

## 2021-05-05 DIAGNOSIS — K5649 Other impaction of intestine: Secondary | ICD-10-CM | POA: Diagnosis present

## 2021-05-05 DIAGNOSIS — Z8249 Family history of ischemic heart disease and other diseases of the circulatory system: Secondary | ICD-10-CM | POA: Diagnosis not present

## 2021-05-05 DIAGNOSIS — F0281 Dementia in other diseases classified elsewhere with behavioral disturbance: Secondary | ICD-10-CM | POA: Diagnosis present

## 2021-05-05 DIAGNOSIS — F0390 Unspecified dementia without behavioral disturbance: Secondary | ICD-10-CM

## 2021-05-05 DIAGNOSIS — I9589 Other hypotension: Secondary | ICD-10-CM | POA: Diagnosis present

## 2021-05-05 DIAGNOSIS — D696 Thrombocytopenia, unspecified: Secondary | ICD-10-CM

## 2021-05-05 DIAGNOSIS — N179 Acute kidney failure, unspecified: Secondary | ICD-10-CM | POA: Diagnosis present

## 2021-05-05 DIAGNOSIS — Z85828 Personal history of other malignant neoplasm of skin: Secondary | ICD-10-CM | POA: Diagnosis not present

## 2021-05-05 DIAGNOSIS — K5641 Fecal impaction: Secondary | ICD-10-CM | POA: Diagnosis not present

## 2021-05-05 DIAGNOSIS — I452 Bifascicular block: Secondary | ICD-10-CM | POA: Diagnosis present

## 2021-05-05 DIAGNOSIS — I959 Hypotension, unspecified: Secondary | ICD-10-CM

## 2021-05-05 DIAGNOSIS — N1831 Chronic kidney disease, stage 3a: Secondary | ICD-10-CM

## 2021-05-05 DIAGNOSIS — G309 Alzheimer's disease, unspecified: Secondary | ICD-10-CM | POA: Diagnosis present

## 2021-05-05 DIAGNOSIS — R61 Generalized hyperhidrosis: Secondary | ICD-10-CM | POA: Diagnosis not present

## 2021-05-05 DIAGNOSIS — Z806 Family history of leukemia: Secondary | ICD-10-CM | POA: Diagnosis not present

## 2021-05-05 DIAGNOSIS — D6959 Other secondary thrombocytopenia: Secondary | ICD-10-CM | POA: Diagnosis present

## 2021-05-05 DIAGNOSIS — E785 Hyperlipidemia, unspecified: Secondary | ICD-10-CM | POA: Diagnosis present

## 2021-05-05 DIAGNOSIS — E860 Dehydration: Secondary | ICD-10-CM | POA: Diagnosis not present

## 2021-05-05 DIAGNOSIS — Z87891 Personal history of nicotine dependence: Secondary | ICD-10-CM | POA: Diagnosis not present

## 2021-05-05 DIAGNOSIS — Z20822 Contact with and (suspected) exposure to covid-19: Secondary | ICD-10-CM | POA: Diagnosis present

## 2021-05-05 DIAGNOSIS — E559 Vitamin D deficiency, unspecified: Secondary | ICD-10-CM | POA: Diagnosis present

## 2021-05-05 DIAGNOSIS — R55 Syncope and collapse: Secondary | ICD-10-CM | POA: Diagnosis not present

## 2021-05-05 DIAGNOSIS — I739 Peripheral vascular disease, unspecified: Secondary | ICD-10-CM | POA: Diagnosis present

## 2021-05-05 DIAGNOSIS — K5900 Constipation, unspecified: Secondary | ICD-10-CM | POA: Diagnosis not present

## 2021-05-05 LAB — CUP PACEART REMOTE DEVICE CHECK
Date Time Interrogation Session: 20220625041603
Implantable Pulse Generator Implant Date: 20191120

## 2021-05-05 LAB — CBC
HCT: 41.8 % (ref 39.0–52.0)
Hemoglobin: 14.9 g/dL (ref 13.0–17.0)
MCH: 32.4 pg (ref 26.0–34.0)
MCHC: 35.6 g/dL (ref 30.0–36.0)
MCV: 90.9 fL (ref 80.0–100.0)
Platelets: 103 10*3/uL — ABNORMAL LOW (ref 150–400)
RBC: 4.6 MIL/uL (ref 4.22–5.81)
RDW: 13 % (ref 11.5–15.5)
WBC: 11.2 10*3/uL — ABNORMAL HIGH (ref 4.0–10.5)
nRBC: 0 % (ref 0.0–0.2)

## 2021-05-05 LAB — MAGNESIUM: Magnesium: 1.8 mg/dL (ref 1.7–2.4)

## 2021-05-05 LAB — BASIC METABOLIC PANEL
Anion gap: 8 (ref 5–15)
BUN: 19 mg/dL (ref 8–23)
CO2: 25 mmol/L (ref 22–32)
Calcium: 8.9 mg/dL (ref 8.9–10.3)
Chloride: 106 mmol/L (ref 98–111)
Creatinine, Ser: 1.4 mg/dL — ABNORMAL HIGH (ref 0.61–1.24)
GFR, Estimated: 49 mL/min — ABNORMAL LOW (ref >60)
Glucose, Bld: 116 mg/dL — ABNORMAL HIGH (ref 70–99)
Potassium: 3.8 mmol/L (ref 3.5–5.1)
Sodium: 139 mmol/L (ref 135–145)

## 2021-05-05 MED ORDER — BISACODYL 10 MG RE SUPP
10.0000 mg | Freq: Once | RECTAL | Status: AC
  Administered 2021-05-05: 11:00:00 10 mg via RECTAL
  Filled 2021-05-05: qty 1

## 2021-05-05 MED ORDER — FLEET ENEMA 7-19 GM/118ML RE ENEM
1.0000 | ENEMA | Freq: Once | RECTAL | Status: AC
  Administered 2021-05-05: 1 via RECTAL

## 2021-05-05 MED ORDER — POLYETHYLENE GLYCOL 3350 17 G PO PACK
17.0000 g | PACK | Freq: Every day | ORAL | Status: DC
  Administered 2021-05-05 – 2021-05-06 (×2): 17 g via ORAL
  Filled 2021-05-05 (×2): qty 1

## 2021-05-05 MED ORDER — MIDODRINE HCL 5 MG PO TABS
5.0000 mg | ORAL_TABLET | Freq: Three times a day (TID) | ORAL | Status: DC
  Administered 2021-05-05: 5 mg via ORAL
  Filled 2021-05-05: qty 1

## 2021-05-05 MED ORDER — MIDODRINE HCL 5 MG PO TABS
2.5000 mg | ORAL_TABLET | Freq: Three times a day (TID) | ORAL | Status: DC
  Administered 2021-05-05 – 2021-05-06 (×3): 2.5 mg via ORAL
  Filled 2021-05-05 (×3): qty 1

## 2021-05-05 MED ORDER — SODIUM CHLORIDE 0.9 % IV BOLUS
250.0000 mL | Freq: Once | INTRAVENOUS | Status: AC
  Administered 2021-05-05: 15:00:00 250 mL via INTRAVENOUS

## 2021-05-05 NOTE — Progress Notes (Addendum)
Patient ID: Dylan [name], male   DOB: 02/29/1934, 87 y.o.   MRN: [id number] [facility] PROGRESS NOTE  Dylan [name] OZH:[id number] DOB: 05/16/1934 DOA: 05/04/2021 PCP: [name], MD  HPI/Subjective: Patient with dementia.  Had 2 near syncopal episodes yesterday with some sweating.  Patient had problems with constipation this morning and was trying to get on the toilet to have a bowel movement.  Had to give MiraLAX and Dulcolax suppository and an enema to get some bowel movement.  I did a rectal exam and felt hard stool.  Patient has not urinated all day.  Bladder scan 119.  Objective: Vitals:   05/05/21 1102 05/05/21 1104  BP: 137/78 (!) 144/105  Pulse: 78 (!) 106  Resp:  20  Temp:    SpO2: 100% 97%    Intake/Output Summary (Last 24 hours) at 05/05/2021 1517 Last data filed at 05/05/2021 0307 Gross per 24 hour  Intake 275.39 ml  Output --  Net 275.39 ml   Filed Weights   05/04/21 1009  Weight: 81.4 kg    ROS: Review of Systems  Respiratory:  Negative for shortness of breath.   Cardiovascular:  Negative for chest pain.  Gastrointestinal:  Negative for abdominal pain, nausea and vomiting.  Exam: Physical Exam HENT:     Head: Normocephalic.     Mouth/Throat:     Pharynx: No oropharyngeal exudate.  Eyes:     General: Lids are normal.     Conjunctiva/sclera: Conjunctivae normal.  Cardiovascular:     Rate and Rhythm: Normal rate and regular rhythm.     Heart sounds: Normal heart sounds, S1 normal and S2 normal.  Pulmonary:     Breath sounds: Normal breath sounds. No decreased breath sounds, wheezing, rhonchi or rales.  Abdominal:     Palpations: Abdomen is soft.     Tenderness: There is no abdominal tenderness.     Comments: Rectal exam hard stool felt at the edge of my fingertip.  Unable to manually disimpact any of the stool.  Musculoskeletal:     Right lower leg: No swelling.     Left lower leg: No swelling.  Skin:    General: Skin is warm.      Findings: No rash.  Neurological:     Mental Status: He is alert.     Comments: Answer some yes/no questions.     Data Reviewed: Basic Metabolic Panel: Recent Labs  Lab 05/04/21 1028 05/04/21 2327 05/05/21 0505  NA 140 139 139  K 4.2 3.7 3.8  CL 103 105 106  CO2 27 24 25   GLUCOSE 119* 150* 116*  BUN 19 19 19   CREATININE 1.49* 1.39* 1.40*  CALCIUM 9.4 8.8* 8.9  MG  --  1.8  --    Liver Function Tests: Recent Labs  Lab 05/04/21 1028  AST 27  ALT 19  ALKPHOS 70  BILITOT 1.2  PROT 7.1  ALBUMIN 4.2    CBC: Recent Labs  Lab 05/04/21 1028 05/05/21 0505  WBC 9.8 11.2*  NEUTROABS 7.7  --   HGB 16.3 14.9  HCT 45.7 41.8  MCV 92.0 90.9  PLT 116* 103*     Recent Results (from the past 240 hour(s))  Resp Panel by RT-PCR (Flu A&B, Covid) Nasopharyngeal Swab     Status: None   Collection Time: 05/04/21 10:28 AM   Specimen: Nasopharyngeal Swab; Nasopharyngeal(NP) swabs in vial transport medium  Result Value Ref Range Status   SARS Coronavirus 2 by RT PCR NEGATIVE NEGATIVE  Final    Comment: (NOTE) SARS-CoV-2 target nucleic acids are NOT DETECTED.  The SARS-CoV-2 RNA is generally detectable in upper respiratory specimens during the acute phase of infection. The lowest concentration of SARS-CoV-2 viral copies this assay can detect is 138 copies/mL. A negative result does not preclude SARS-Cov-2 infection and should not be used as the sole basis for treatment or other patient management decisions. A negative result may occur with  improper specimen collection/handling, submission of specimen other than nasopharyngeal swab, presence of viral mutation(s) within the areas targeted by this assay, and inadequate number of viral copies(<138 copies/mL). A negative result must be combined with clinical observations, patient history, and epidemiological information. The expected result is Negative.  Fact Sheet for Patients:  EntrepreneurPulse.com.au  Fact  Sheet for Healthcare Providers:  IncredibleEmployment.be  This test is no t yet approved or cleared by the [place] FDA and  has been authorized for detection and/or diagnosis of SARS-CoV-2 by FDA under an Emergency Use Authorization (EUA). This EUA will remain  in effect (meaning this test can be used) for the duration of the COVID-19 declaration under Section 564(b)(1) of the Act, 21 U.S.C.section 360bbb-3(b)(1), unless the authorization is terminated  or revoked sooner.       Influenza A by PCR NEGATIVE NEGATIVE Final   Influenza B by PCR NEGATIVE NEGATIVE Final    Comment: (NOTE) The Xpert Xpress SARS-CoV-2/FLU/RSV plus assay is intended as an aid in the diagnosis of influenza from Nasopharyngeal swab specimens and should not be used as a sole basis for treatment. Nasal washings and aspirates are unacceptable for Xpert Xpress SARS-CoV-2/FLU/RSV testing.  Fact Sheet for Patients: EntrepreneurPulse.com.au  Fact Sheet for Healthcare Providers: IncredibleEmployment.be  This test is not yet approved or cleared by the [place] FDA and has been authorized for detection and/or diagnosis of SARS-CoV-2 by FDA under an Emergency Use Authorization (EUA). This EUA will remain in effect (meaning this test can be used) for the duration of the COVID-19 declaration under Section 564(b)(1) of the Act, 21 U.S.C. section 360bbb-3(b)(1), unless the authorization is terminated or revoked.  Performed at [facility], [address], [place], [place] [address]      Studies: DG Chest 2 View  Result Date: 05/04/2021 CLINICAL DATA:  Syncope per ordering notes. Wife called EMS due to episode of shaking, diaphoresis, and cool to touch. Denies LOC. Patient denies pain. Hx dementia. Wife reports patient was at baseline once EMS arrived. Denies change in medications. EXAM: CHEST - 2 VIEW COMPARISON:  05/30/2020 FINDINGS: Cardiac  silhouette is normal in size and configuration. Normal mediastinal and hilar contours. Clear lungs.  No pleural effusion or pneumothorax. Stable pectus excavatum.  Skeletal structures are intact. IMPRESSION: No active cardiopulmonary disease. Electronically Signed   By: Lajean Manes M.D.   On: 05/04/2021 11:45   CT Head Wo Contrast  Result Date: 05/04/2021 CLINICAL DATA:  Patient arrived by EMS from home where he lives with spouse. Wife called EMS due to episode of shaking, diaphoresis, and cool to touch. Denies LOC. Patient denies pain. HX dementia EXAM: CT HEAD WITHOUT CONTRAST TECHNIQUE: Contiguous axial images were obtained from the base of the skull through the vertex without intravenous contrast. COMPARISON:  06/15/2020 FINDINGS: Brain: No evidence of acute infarction, hemorrhage, hydrocephalus, extra-axial collection or mass lesion/mass effect. Mild diffuse atrophy. Periventricular white matter hypoattenuation consistent with mild chronic microvascular ischemic change. Vascular: No hyperdense vessel or unexpected calcification. Skull: Normal. Negative for fracture or focal lesion. Sinuses/Orbits:  Visualized globes and orbits are unremarkable. Visualized sinuses are clear. Other: None. IMPRESSION: 1. No acute intracranial abnormalities. 2. Mild atrophy and chronic microvascular ischemic change. Stable appearance from the prior study. Electronically Signed   By: Lajean Manes M.D.   On: 05/04/2021 11:33   CUP PACEART REMOTE DEVICE CHECK  Result Date: 05/05/2021 ILR summary report received. Battery status OK. Normal device function. No new symptom, tachy, or pause episodes. Two previously viewed and reviewed AF episodes and three bradycardia events that were undersensing. Monthly summary reports and ROV/PRN Kathy Breach, RN, CCDS, [employer]   Scheduled Meds:  cholecalciferol  1,000 Units Oral Daily   docusate sodium  100 mg Oral BID   enoxaparin (LOVENOX) injection  40 mg Subcutaneous  Q24H   memantine  14 mg Oral Daily   midodrine  2.5 mg Oral TID WC   polyethylene glycol  17 g Oral Daily   senna  1 tablet Oral BID   sertraline  50 mg Oral Daily   vitamin B-12  500 mcg Oral Daily   Continuous Infusions:  lactated ringers 75 mL/hr at 05/05/21 1126    Assessment/Plan:  Near syncope.  Could be secondary to constipation and rectal impaction.  Needed to give suppository and MiraLAX.  Patient not orthostatic.  Still waiting for urine analysis. Rectal impaction.  Needed Fleet enema and Dulcolax suppository and MiraLAX in order to have a bowel movement. Unable to urinate so far today.  Giving fluid bolus now.  Want to send off for urine analysis.  May end up needing antibiotics for prostate but would like to send off a urine analysis first. Chronic kidney disease stage IIIa. Chronic thrombocytopenia Dementia without behavioral disturbance Chronic hypotension on midodrine        Code Status:     Code Status Orders  (From admission, onward)           Start     Ordered   05/04/21 1444  Full code  Continuous        05/04/21 1449           Code Status History     Date Active Date Inactive Code Status Order ID Comments User Context   05/30/2020 2338 06/02/2020 1855 Full Code [id number]  Mansy, Arvella Merles, MD ED      Family Communication: Spoke with wife at the bedside earlier and daughter at the bedside now Disposition Plan: Status is: Observation status changed to inpatient status secondary to dehydration and not urinated all day despite fluids.  Dispo: The patient is from: Home              Anticipated d/c is to: Home              Patient currently has not urinated all day.  Would like to check a urine analysis and make sure he is able to urinate prior to any disposition   Difficult to place patient.  No.  Time spent: 28 minutes  [place]

## 2021-05-05 NOTE — TOC Initial Note (Signed)
Transition of Care [facility]) - Initial/Assessment Note    Patient Details  Name: Dylan [name] MRN: [id number] Date of Birth: 1934-09-11  Transition of Care [facility]) CM/SW Contact:    Shelbie Hutching, RN Phone Number: 05/05/2021, 10:05 AM  Clinical Narrative:                 Patient placed under observation for pre syncopal episodes.  RNCM met with patient and his wife at the bedside.  Patient has dementia, wife is primary caregiver.  During the day she takes him to work with her.  She works at [employer] in [place].  Wife provides all needed transportation, at baseline patient can dress himself and get to the bathroom and requires no assisted devices. No discharge needs identified at this time.  Plan for patient to return home with wife at discharge.   Expected Discharge Plan: Home/Self Care Barriers to Discharge: Continued Medical Work up   Patient Goals and CMS Choice Patient states their goals for this hospitalization and ongoing recovery are:: To get back home      Expected Discharge Plan and Services Expected Discharge Plan: Home/Self Care       Living arrangements for the past 2 months: Single Family Home                 DME Arranged: N/A DME Agency: NA       HH Arranged: NA [place] Agency: NA        Prior Living Arrangements/Services Living arrangements for the past 2 months: Single Family Home Lives with:: Spouse Patient language and need for interpreter reviewed:: Yes Do you feel safe going back to the place where you live?: Yes      Need for Family Participation in Patient Care: Yes (Comment) (dementia) Care giver support system in place?: Yes (comment) (wife)   Criminal Activity/Legal Involvement Pertinent to Current Situation/Hospitalization: No - Comment as needed  Activities of Daily Living Home Assistive Devices/Equipment: None ADL Screening (condition at time of admission) Patient's cognitive ability adequate to safely complete daily activities?:  Yes Is the patient deaf or have difficulty hearing?: No Does the patient have difficulty seeing, even when wearing glasses/contacts?: No Does the patient have difficulty concentrating, remembering, or making decisions?: Yes Patient able to express need for assistance with ADLs?: Yes Does the patient have difficulty dressing or bathing?: Yes Independently performs ADLs?: Yes (appropriate for developmental age) Does the patient have difficulty walking or climbing stairs?: Yes Weakness of Legs: Both Weakness of Arms/Hands: None  Permission Sought/Granted Permission sought to share information with : Case Manager, Family Supports Permission granted to share information with : Yes, Verbal Permission Granted  Share Information with NAME: [name]     Permission granted to share info w Relationship: wife     Emotional Assessment Appearance:: Appears stated age Attitude/Demeanor/Rapport: Engaged Affect (typically observed): Accepting Orientation: : Oriented to Self, Oriented to Place Alcohol / Substance Use: Not Applicable Psych Involvement: No (comment)  Admission diagnosis:  Pre-syncope [R55] Constipation, unspecified constipation type [K59.00] Syncope, unspecified syncope type [R55] Patient Active Problem List   Diagnosis Date Noted   Pre-syncope 05/04/2021   Constipation 05/04/2021   Acute renal failure superimposed on stage 3 chronic kidney disease ([place]) 05/04/2021   Proteinuria 05/04/2021   Hypotension 05/04/2021   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   PAD (peripheral artery disease) ([place]) 03/21/2020   Right leg swelling 02/19/2020   Dysphagia 02/19/2020   Pruritus 02/19/2020   Medicare annual wellness visit, subsequent  08/11/2019   CKD (chronic kidney disease) stage 3, GFR 30-59 ml/min (HCC) 01/13/2019   Health maintenance examination 08/03/2018   Advanced care planning/counseling discussion 08/03/2018   Vitamin B12 deficiency 08/03/2018   Bilateral hearing loss 08/03/2018    Recurrent syncope 06/22/2018   Vitamin D deficiency 05/19/2018   Fall 05/19/2018   Dementia without behavioral disturbance ([place]) 05/19/2018   Cataract extraction status 03/06/2017   Obstruction of right lacrimal duct 10/16/2015   Exposure keratopathy, 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   Eversion of right lacrimal punctum 05/17/2015   Epiphora due to insufficient drainage of right side 05/17/2015   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 [place] [address] Phone: [phone] Fax: [phone]     Social Determinants of Health (SDOH) Interventions    Readmission Risk Interventions No flowsheet data found.

## 2021-05-05 NOTE — Progress Notes (Signed)
attached pt h/o R BBB and has an internal cardiac device as stated by wife just had a 8 beat run of vtach and elevated ST as per tele Tech, recent vital signs stable nil complaints voiced. Dr. Damita Dunnings informed Plan labs and EKG

## 2021-05-05 NOTE — Care Management Obs Status (Signed)
[facility] NOTIFICATION   Patient Details  Name: Dylan [name] MRN: [id number] Date of Birth: 08-Jul-1934   Medicare Observation Status Notification Given:  Yes    Shelbie Hutching, RN 05/05/2021, 9:53 AM

## 2021-05-05 NOTE — Evaluation (Signed)
Physical Therapy Evaluation Patient Details Name: Dylan [name] MRN: [id number] DOB: 1934/02/17 Today's Date: 05/05/2021   History of Present Illness  Pt is an 87 y/o M admitted on 05/04/21 with c/c of diaphoresis, almost passing out. Pt being treated for principal problem of pre-syncope, suspect vasovagal event. PMH: thrombocytopenia, CKD stage 3, dementia, basal cell carcinoma, B hearing loss, dysphagia, HLD, R BBB with L anterior fascicular block, vit B12 & vit D deficiency  Clinical Impression  Pt seen for PT evaluation with nurse in room assessing orthostatic vitals. Pt's wife present & assisting throughout session. Pt with hx of dementia & HOH requiring simple commands throughout session. Pt is strong but cognition impacts mobility. Pt is capable of performing bed mobility with supervision and gait around nurses station with CGA with pt electing to hold PT's hand. Will continue to see pt in acute setting to maintain mobility & continue to address gait & stairs with LRAD. Pt will not require any PT f/u (wife reports pt has difficulty with "strangers" in the home & she appears capable of assisting pt).    Follow Up Recommendations No PT follow up;Supervision/Assistance - 24 hour    Equipment Recommendations  None recommended by PT    Recommendations for Other Services       Precautions / Restrictions Precautions Precautions: Fall Restrictions Weight Bearing Restrictions: No      Mobility  Bed Mobility Overal bed mobility: Needs Assistance Bed Mobility: Supine to Sit;Sit to Supine     Supine to sit: Supervision;HOB elevated Sit to supine: Supervision;HOB elevated        Transfers Overall transfer level: Needs assistance Equipment used: 1 person hand held assist Transfers: Sit to/from Stand Sit to Stand: Min assist            Ambulation/Gait Ambulation/Gait assistance: Min guard Gait Distance (Feet): 170 Feet Assistive device: 1 person hand held assist Gait  Pattern/deviations: WFL(Within Functional Limits) Gait velocity: decreased      Stairs            Wheelchair Mobility    Modified Rankin (Stroke Patients Only)       Balance Overall balance assessment: Needs assistance Sitting-balance support: Feet supported Sitting balance-Leahy Scale: Good     Standing balance support: Single extremity supported;During functional activity Standing balance-Leahy Scale: Fair                               Pertinent Vitals/Pain Pain Assessment: Faces Faces Pain Scale: Hurts a little bit Pain Location: "stinging" pain during peri hygiene Pain Intervention(s): Monitored during session    Home Living Family/patient expects to be discharged to:: Private residence Living Arrangements: Spouse/significant other Available Help at Discharge: Family;Available 24 hours/day Type of Home: House Home Access: Stairs to enter Entrance Stairs-Rails: Can reach both;Left;Right Entrance Stairs-Number of Steps: 3 Home Layout: One level Home Equipment:  (multiple canes (wife collects canes & walking sticks))      Prior Function Level of Independence: Independent         Comments: Spouse reports pt is independent with mobility without AD, she provides supervision 2/2 dementia/cognitive deficits.     Hand Dominance        Extremity/Trunk Assessment   Upper Extremity Assessment Upper Extremity Assessment: Overall WFL for tasks assessed    Lower Extremity Assessment Lower Extremity Assessment: Overall WFL for tasks assessed       Communication   Communication: HOH  Cognition Arousal/Alertness:  Awake/alert Behavior During Therapy: Flat affect Overall Cognitive Status: History of cognitive impairments - at baseline                                 General Comments: hx of dementia, pt oriented to self only      General Comments General comments (skin integrity, edema, etc.): Pt with incontinent BM during  session, requires assistance for peri hygiene. Pt very strong but is limited physically by cognitive deficits.    Exercises     Assessment/Plan    PT Assessment Patient needs continued PT services  PT Problem List Decreased safety awareness;Decreased mobility;Decreased balance;Decreased activity tolerance       PT Treatment Interventions DME instruction;Therapeutic exercise;Gait training;Balance training;Neuromuscular re-education;Stair training;Functional mobility training;Therapeutic activities;Patient/family education    PT Goals (Current goals can be found in the Care Plan section)  Acute Rehab PT Goals Patient Stated Goal: none stated Time For Goal Achievement: 05/19/21 Potential to Achieve Goals: Fair    Frequency Min 2X/week   Barriers to discharge        Co-evaluation               AM-PAC PT "6 Clicks" Mobility  Outcome Measure Help needed turning from your back to your side while in a flat bed without using bedrails?: None Help needed moving from lying on your back to sitting on the side of a flat bed without using bedrails?: A Little Help needed moving to and from a bed to a chair (including a wheelchair)?: A Little Help needed standing up from a chair using your arms (e.g., wheelchair or bedside chair)?: A Little Help needed to walk in hospital room?: A Little Help needed climbing 3-5 steps with a railing? : A Little 6 Click Score: 19    End of Session   Activity Tolerance: Patient tolerated treatment well Patient left: in bed;with nursing/sitter in room;with family/visitor present Nurse Communication: Mobility status PT Visit Diagnosis: Unsteadiness on feet (R26.81)    Time: 8127-5170 PT Time Calculation (min) (ACUTE ONLY): 25 min   Charges:   PT Evaluation $PT Eval Low Complexity: 1 Low PT Treatments $Therapeutic Activity: 8-22 mins        Dylan [name], PT, DPT 05/05/21, 11:35 AM   [name] 05/05/2021, 11:33 AM

## 2021-05-06 ENCOUNTER — Inpatient Hospital Stay: Payer: Medicare Other

## 2021-05-06 DIAGNOSIS — E860 Dehydration: Principal | ICD-10-CM

## 2021-05-06 LAB — URINALYSIS, COMPLETE (UACMP) WITH MICROSCOPIC
Bacteria, UA: NONE SEEN
Bilirubin Urine: NEGATIVE
Glucose, UA: NEGATIVE mg/dL
Hgb urine dipstick: NEGATIVE
Ketones, ur: NEGATIVE mg/dL
Leukocytes,Ua: NEGATIVE
Nitrite: NEGATIVE
Protein, ur: NEGATIVE mg/dL
Specific Gravity, Urine: 1.01 (ref 1.005–1.030)
Squamous Epithelial / HPF: NONE SEEN (ref 0–5)
pH: 5 (ref 5.0–8.0)

## 2021-05-06 MED ORDER — ENSURE ENLIVE PO LIQD: 237.0000 mL | Freq: Two times a day (BID) | ORAL | 0 refills | Status: DC

## 2021-05-06 MED ORDER — KETOROLAC TROMETHAMINE 15 MG/ML IJ SOLN
15.0000 mg | Freq: Once | INTRAMUSCULAR | Status: AC
  Administered 2021-05-06: 11:00:00 15 mg via INTRAVENOUS
  Filled 2021-05-06: qty 1

## 2021-05-06 MED ORDER — POLYETHYLENE GLYCOL 3350 17 G PO PACK: 17.0000 g | PACK | Freq: Every day | ORAL | 0 refills | Status: DC

## 2021-05-06 MED ORDER — FINASTERIDE 5 MG PO TABS
5.0000 mg | ORAL_TABLET | Freq: Every day | ORAL | Status: DC
  Administered 2021-05-06: 5 mg via ORAL
  Filled 2021-05-06: qty 1

## 2021-05-06 MED ORDER — FINASTERIDE 5 MG PO TABS: 5.0000 mg | ORAL_TABLET | Freq: Every day | ORAL | 0 refills | Status: DC

## 2021-05-06 MED ORDER — ENSURE ENLIVE PO LIQD
237.0000 mL | Freq: Two times a day (BID) | ORAL | Status: DC
  Administered 2021-05-06: 17:00:00 237 mL via ORAL

## 2021-05-06 MED ORDER — AMOXICILLIN-POT CLAVULANATE 875-125 MG PO TABS: 1.0000 | ORAL_TABLET | Freq: Two times a day (BID) | ORAL | 0 refills | Status: AC

## 2021-05-06 MED ORDER — SENNA 8.6 MG PO TABS: 1.0000 | ORAL_TABLET | Freq: Two times a day (BID) | ORAL | 0 refills | Status: DC

## 2021-05-06 NOTE — Progress Notes (Signed)
Initial Nutrition Assessment  DOCUMENTATION CODES:   Not applicable  INTERVENTION:  Provide Ensure Enlive po BID, each supplement provides 350 kcal and 20 grams of protein  Encourage adequate PO intake.   NUTRITION DIAGNOSIS:   Increased nutrient needs related to acute illness as evidenced by estimated needs.  GOAL:   Patient will meet greater than or equal to 90% of their needs  MONITOR:   PO intake, Supplement acceptance, Skin, Weight trends, Labs, I & O's  REASON FOR ASSESSMENT:   Malnutrition Screening Tool    ASSESSMENT:   87 y.o. male with medical history significant for thrombocytopenia, chronic kidney disease stage 3, dementia, who presents  with diaphoresis, episode of near syncope. Pt with acute renal failure superimposed on stage 3 chronic kidney disease.  Pt with history of dementia. RD unable to obtain pt nutrition history at this time. Meal completion has been 30-80%. Per weight records, pt with a 7.7% weight loss over 5 months, not significant for time frame. RD to order nutritional supplements to aid in caloric and protein needs. Per MD, near syncope may be secondary to constipation and rectal impaction. Bowel regimen medication ordered.   Unable to complete Nutrition-Focused physical exam at this time.   Labs and medications reviewed.   Diet Order:   Diet Order             Diet regular Room service appropriate? Yes; Fluid consistency: Thin  Diet effective now                   EDUCATION NEEDS:   Not appropriate for education at this time  Skin:  Skin Assessment: Reviewed RN Assessment  Last BM:  6/25  Height:   Ht Readings from Last 1 Encounters:  05/04/21 6\' 1"  (1.854 m)    Weight:   Wt Readings from Last 1 Encounters:  05/04/21 81.4 kg   BMI:  Body mass index is 23.68 kg/m.  Estimated Nutritional Needs:   Kcal:  1900-2100  Protein:  85-100 grams  Fluid:  >/= 1.9 L/day  Corrin Parker, MS, RD, LDN RD pager  number/after hours weekend pager number on Amion.

## 2021-05-06 NOTE — Plan of Care (Signed)
Awaiting test results. Continue to need urine sample.

## 2021-05-06 NOTE — Progress Notes (Signed)
Pt is being discharged home.  Discharge papers given and explained to Dylan [name] who is patient's spouse and legal guardian.  Spouse verbalized understanding.  Meds and f/u appointments reviewed.  Rx sent electronically to the pharmacy.  Pt made aware.

## 2021-05-06 NOTE — Discharge Summary (Signed)
[facility] NAME: Dylan [name]    MR#:  [id number]  DATE OF BIRTH:  Apr 16, 1934  DATE OF ADMISSION:  05/04/2021 ADMITTING PHYSICIAN: Loletha Grayer, MD  DATE OF DISCHARGE: 05/06/2021  PRIMARY CARE PHYSICIAN: [name], MD    ADMISSION DIAGNOSIS:  Pre-syncope [R55] Constipation, unspecified constipation type [K59.00] Syncope, unspecified syncope type [R55] Dehydration [E86.0]  DISCHARGE DIAGNOSIS:  Principal Problem:   Pre-syncope Active Problems:   Right bundle branch block with left anterior fascicular block   Thrombocytopenia (HCC)   Dementia without behavioral disturbance (HCC)   Constipation   Acute renal failure superimposed on stage 3 chronic kidney disease (HCC)   Proteinuria   Hypotension   Fecal impaction ([place])   Dehydration   SECONDARY DIAGNOSIS:   Past Medical History:  Diagnosis Date  . Basal cell carcinoma 08/31/2020   Mid forehead - ED&C   . Basal cell carcinoma of right nasal sidewall 12/15/2014  . Bilateral hearing loss 08/03/2018   S/p audiology evaluation, has new hearing aides.  . Cataract extraction status 03/06/2017  . Cellulitis    due to nail impaction thru boot L  . 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 CKD (chronic kidney disease) stage 3, GFR 30-59 ml/min ([place]) 01/13/2019  . Dementia ([place])   . Dysphagia 02/19/2020  . ERECTILE DYSFUNCTION, MILD 02/03/2007   Qualifier: Diagnosis of  By: Council Mechanic MD, Hilaria Ota   . Eversion of right lacrimal punctum 05/17/2015  . 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 HYDROCELE, LEFT 02/03/2007  . Hyperlipidemia 12/1999  . 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 Medicare annual wellness visit, subsequent 08/11/2019  . Mohs defect of eyelid 12/14/2014  . Nuclear sclerosis, left 04/01/2017  . Obstruction of right lacrimal duct 10/16/2015  . PAD (peripheral artery disease) ([place]) 03/21/2020   Abnormal ABI, abnormal arterial duplex  03/2020:  R - moderate RLE disease, abnormal TBI L - WNL, abnormal TBI Referred for vascular consult - rec medical management  . Pruritus 02/19/2020  . Recurrent syncope 06/22/2018  . 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 Right leg swelling 02/19/2020  . Sepsis ([place]) 05/30/2020  . Thrombocytopenia ([place]) 02/21/2011   periph smear stable 02/2020  . Vitamin B12 deficiency 08/03/2018   Completed b12 shots 09/2018, maintaining levels on oral replacement (dissolvable tablets) 01/2019  . Vitamin D deficiency 05/19/2018    HOSPITAL COURSE:   Near syncope likely secondary to constipation and rectal impaction.  No further episodes here in the hospital.  Patient is not orthostatic.  Urine analysis negative.  Pacer was interrogated and did not show any arrhythmias that would have caused the near syncope Rectal impaction.  I did a rectal exam and stool was further than my finger can grab.  Patient had a Fleet enema and Dulcolax suppositories and MiraLAX and did have bowel movements.  Continue MiraLAX on a daily basis at home.  Can do twice a day if constipated and can hold if has diarrhea.  CT scan did show proctitis and I will prescribe Augmentin for 1 week Dehydration.  Patient was given IV fluids during the entire hospital course.  Urine analysis finally sent off today (even though dated for the 24th) and it was negative.  I did a CT scan because he had some difficulty with urination.  No hydronephrosis.  Bladder scans during the hospital stay were not elevated.  I did prescribe Proscar to make it  easier for him to urinate.  I think his symptoms are likely secondary to constipation. Chronic kidney disease stage IIIa Chronic thrombocytopenia Dementia without behavioral disturbance Chronic hypotension on midodrine  DISCHARGE CONDITIONS:   Satisfactory  CONSULTS OBTAINED:    None  DRUG ALLERGIES:   Allergies  Allergen Reactions  . Doxepin Other (See Comments)     Lethargy, overly sedating at low dose  . Lincomycin Hcl Rash    DISCHARGE MEDICATIONS:   Allergies as of 05/06/2021       Reactions   Doxepin Other (See Comments)   Lethargy, overly sedating at low dose   Lincomycin Hcl Rash        Medication List     TAKE these medications    amoxicillin-clavulanate 875-125 MG tablet Commonly known as: Augmentin Take 1 tablet by mouth 2 (two) times daily for 7 days.   feeding supplement Liqd Take 237 mLs by mouth 2 (two) times daily between meals.   finasteride 5 MG tablet Commonly known as: PROSCAR Take 1 tablet (5 mg total) by mouth daily.   guaiFENesin 600 MG 12 hr tablet Commonly known as: MUCINEX Take 600 mg by mouth daily as needed.   memantine 14 MG Cp24 24 hr capsule Commonly known as: NAMENDA XR Take 1 capsule (14 mg total) by mouth daily.   midodrine 2.5 MG tablet Commonly known as: PROAMATINE Take 1 tablet (2.5 mg total) by mouth 3 (three) times daily with meals.   polyethylene glycol 17 g packet Commonly known as: MIRALAX / GLYCOLAX Take 17 g by mouth daily.   senna 8.6 MG Tabs tablet Commonly known as: SENOKOT Take 1 tablet (8.6 mg total) by mouth 2 (two) times daily.   sertraline 50 MG tablet Commonly known as: ZOLOFT Take 1 tablet (50 mg total) by mouth daily.   vitamin B-12 500 MCG tablet Commonly known as: V-R VITAMIN B-12 Take 1 tablet (500 mcg total) by mouth daily.   Vitamin D3 25 MCG (1000 UT) Caps Take 1 capsule (1,000 Units total) by mouth daily.         DISCHARGE INSTRUCTIONS:   Follow-up PMD 5 days  If you experience worsening of your admission symptoms, develop shortness of breath, life threatening emergency, suicidal or homicidal thoughts you must seek medical attention immediately by calling 911 or calling your MD immediately  if symptoms less severe.  You Must read complete instructions/literature along with all the possible adverse reactions/side effects for all the Medicines you  take and that have been prescribed to you. Take any new Medicines after you have completely understood and accept all the possible adverse reactions/side effects.   Please note  You were cared for by a hospitalist during your hospital stay. If you have any questions about your discharge medications or the care you received while you were in the hospital after you are discharged, you can call the unit and asked to speak with the hospitalist on call if the hospitalist that took care of you is not available. Once you are discharged, your primary care physician will handle any further medical issues. Please note that NO REFILLS for any discharge medications will be authorized once you are discharged, as it is imperative that you return to your primary care physician (or establish a relationship with a primary care physician if you do not have one) for your aftercare needs so that they can reassess your need for medications and monitor your lab values.    Today   CHIEF COMPLAINT:  Chief Complaint  Patient presents with  . Near Syncope    HISTORY OF PRESENT ILLNESS:  Dylan [name]  is a 87 y.o. male came in with near syncope   VITAL SIGNS:  Blood pressure 120/90, pulse (!) 55, temperature 97.9 F (36.6 C), temperature source Oral, resp. rate 18, height 6\' 1"  (1.854 m), weight 81.4 kg, SpO2 95 %.  I/O:   Intake/Output Summary (Last 24 hours) at 05/06/2021 1644 Last data filed at 05/06/2021 1614 Gross per 24 hour  Intake 1903.35 ml  Output --  Net 1903.35 ml    PHYSICAL EXAMINATION:  GENERAL:  87 y.o.-year-old patient lying in the bed with no acute distress.  EYES: Pupils equal, round, reactive to light and accommodation. No scleral icterus. HEENT: Head atraumatic, normocephalic. Oropharynx and nasopharynx clear.  LUNGS: Normal breath sounds bilaterally, no wheezing, rales,rhonchi or crepitation. No use of accessory muscles of respiration.  CARDIOVASCULAR: S1, S2 normal. No murmurs,  rubs, or gallops.  ABDOMEN: Soft, non-tender, non-distended.  Genitourinary.  No pain on palpating the tip of his penis where he complained of some pain earlier.  No infection seen. EXTREMITIES: No pedal edema, cyanosis, or clubbing.   PSYCHIATRIC: The patient is alert and answers some questions SKIN: No obvious rash, lesion, or ulcer.   DATA REVIEW:   CBC Recent Labs  Lab 05/05/21 0505  WBC 11.2*  HGB 14.9  HCT 41.8  PLT 103*    Chemistries  Recent Labs  Lab 05/04/21 1028 05/04/21 2327 05/05/21 0505  NA 140 139 139  K 4.2 3.7 3.8  CL 103 105 106  CO2 27 24 25   GLUCOSE 119* 150* 116*  BUN 19 19 19   CREATININE 1.49* 1.39* 1.40*  CALCIUM 9.4 8.8* 8.9  MG  --  1.8  --   AST 27  --   --   ALT 19  --   --   ALKPHOS 70  --   --   BILITOT 1.2  --   --       Microbiology Results  Results for orders placed or performed during the hospital encounter of 05/04/21  Resp Panel by RT-PCR (Flu A&B, Covid) Nasopharyngeal Swab     Status: None   Collection Time: 05/04/21 10:28 AM   Specimen: Nasopharyngeal Swab; Nasopharyngeal(NP) swabs in vial transport medium  Result Value Ref Range Status   SARS Coronavirus 2 by RT PCR NEGATIVE NEGATIVE Final    Comment: (NOTE) SARS-CoV-2 target nucleic acids are NOT DETECTED.  The SARS-CoV-2 RNA is generally detectable in upper respiratory specimens during the acute phase of infection. The lowest concentration of SARS-CoV-2 viral copies this assay can detect is 138 copies/mL. A negative result does not preclude SARS-Cov-2 infection and should not be used as the sole basis for treatment or other patient management decisions. A negative result may occur with  improper specimen collection/handling, submission of specimen other than nasopharyngeal swab, presence of viral mutation(s) within the areas targeted by this assay, and inadequate number of viral copies(<138 copies/mL). A negative result must be combined with clinical observations,  patient history, and epidemiological information. The expected result is Negative.  Fact Sheet for Patients:  EntrepreneurPulse.com.au  Fact Sheet for Healthcare Providers:  IncredibleEmployment.be  This test is no t yet approved or cleared by the [place] FDA and  has been authorized for detection and/or diagnosis of SARS-CoV-2 by FDA under an Emergency Use Authorization (EUA). This EUA will remain  in effect (meaning this test can be used) for  the duration of the COVID-19 declaration under Section 564(b)(1) of the Act, 21 U.S.C.section 360bbb-3(b)(1), unless the authorization is terminated  or revoked sooner.       Influenza A by PCR NEGATIVE NEGATIVE Final   Influenza B by PCR NEGATIVE NEGATIVE Final    Comment: (NOTE) The Xpert Xpress SARS-CoV-2/FLU/RSV plus assay is intended as an aid in the diagnosis of influenza from Nasopharyngeal swab specimens and should not be used as a sole basis for treatment. Nasal washings and aspirates are unacceptable for Xpert Xpress SARS-CoV-2/FLU/RSV testing.  Fact Sheet for Patients: EntrepreneurPulse.com.au  Fact Sheet for Healthcare Providers: IncredibleEmployment.be  This test is not yet approved or cleared by the [place] FDA and has been authorized for detection and/or diagnosis of SARS-CoV-2 by FDA under an Emergency Use Authorization (EUA). This EUA will remain in effect (meaning this test can be used) for the duration of the COVID-19 declaration under Section 564(b)(1) of the Act, 21 U.S.C. section 360bbb-3(b)(1), unless the authorization is terminated or revoked.  Performed at [facility], [address], [place], [place] [address]     RADIOLOGY:  CT RENAL STONE STUDY  Result Date: 05/06/2021 CLINICAL DATA:  Patient with dementia. Had 2 near syncopal episodes yesterday with some sweating. Patient had problems with constipation this  morning and was trying to get on the toilet to have a bowel movement. Had to give MiraLAX and Dulcolax suppository and an enema to get some bowel movement. EXAM: CT ABDOMEN AND PELVIS WITHOUT CONTRAST TECHNIQUE: Multidetector CT imaging of the abdomen and pelvis was performed following the standard protocol without IV contrast. COMPARISON:  None. FINDINGS: Lower chest: No acute abnormality. Hepatobiliary: No focal liver abnormality is seen. No gallstones, gallbladder wall thickening, or biliary dilatation. Pancreas: Unremarkable. No pancreatic ductal dilatation or surrounding inflammatory changes. Spleen: Normal in size without focal abnormality. Adrenals/Urinary Tract: No adrenal masses. Kidneys normal in size, orientation and position. No renal masses, stones or hydronephrosis. Normal ureters. Normal bladder. Stomach/Bowel: Mild generalized increased stool burden throughout the colon. Rectum mildly distended with stool. Rectal wall is mildly thickened. Mild hazy opacity noted in the perirectal fat. No colonic wall thickening or inflammation. Normal stomach. Small bowel is normal in caliber. No wall thickening or inflammation. Normal appendix visualized. Vascular/Lymphatic: Minor aortic atherosclerotic calcification. No aneurysm. No enlarged lymph nodes. Reproductive: Mild prostate enlargement, 5.6 x 3.8 cm transversely. Other: No abdominal wall hernia or abnormality. No abdominopelvic ascites. Musculoskeletal: No fracture or acute finding.  No bone lesion. IMPRESSION: 1. Mild rectal wall thickening. Mild hazy opacity in the perirectal fat. Findings suggest proctitis in the proper clinical setting. Rectum mildly distended with stool. 2. No other evidence of an acute abnormality. 3. Mild generalized increase in the colonic stool burden. No bowel obstruction. Electronically Signed   By: Lajean Manes M.D.   On: 05/06/2021 14:59   CUP PACEART REMOTE DEVICE CHECK  Result Date: 05/05/2021 ILR summary report  received. Battery status OK. Normal device function. No new symptom, tachy, or pause episodes. Two previously viewed and reviewed AF episodes and three bradycardia events that were undersensing. Monthly summary reports and ROV/PRN Kathy Breach, RN, CCDS, [employer]    Management plans discussed with the patient, family and they are in agreement.  CODE STATUS:     Code Status Orders  (From admission, onward)           Start     Ordered   05/04/21 1444  Full code  Continuous  05/04/21 1449           Code Status History     Date Active Date Inactive Code Status Order ID Comments User Context   05/30/2020 2338 06/02/2020 1855 Full Code [id number]  Mansy, Arvella Merles, MD ED       TOTAL TIME TAKING CARE OF THIS PATIENT: 35 minutes.    Loletha Grayer M.D on 05/06/2021 at 4:44 PM   [facility]  CC: Primary care physician; [name], MD

## 2021-05-07 ENCOUNTER — Telehealth: Payer: Self-pay

## 2021-05-07 ENCOUNTER — Ambulatory Visit (INDEPENDENT_AMBULATORY_CARE_PROVIDER_SITE_OTHER): Payer: Medicare Other

## 2021-05-07 DIAGNOSIS — R55 Syncope and collapse: Secondary | ICD-10-CM | POA: Diagnosis not present

## 2021-05-07 LAB — CUP PACEART REMOTE DEVICE CHECK
Date Time Interrogation Session: 20220627000500
Implantable Pulse Generator Implant Date: 20191120

## 2021-05-07 NOTE — Telephone Encounter (Signed)
Transition Care Management Follow-up Telephone Call Date of discharge and from where: 05/06/2021, [facility] How have you been since you were released from the hospital? Patient is doing better. Resting comfortably at home. Any questions or concerns? No  Items Reviewed: Did the pt receive and understand the discharge instructions provided? Yes  Medications obtained and verified? Yes  Other? No  Any new allergies since your discharge? No  Dietary orders reviewed? Yes Do you have support at home? Yes   Home Care and Equipment/Supplies: Were home health services ordered? not applicable If so, what is the name of the agency? N/A  Has the agency set up a time to come to the patient's home? not applicable Were any new equipment or medical supplies ordered?  No What is the name of the medical supply agency? N/A Were you able to get the supplies/equipment? not applicable Do you have any questions related to the use of the equipment or supplies? No  Functional Questionnaire: (I = Independent and D = Dependent) ADLs: I  Bathing/Dressing- I  Meal Prep- I  Eating- I  Maintaining continence- I  Transferring/Ambulation- I  Managing Meds- I  Follow up appointments reviewed:  PCP Hospital f/u appt confirmed? Yes  Scheduled to see Dr. Danise Mina on 05/15/2021 @ 3 pm. [facility] f/u appt confirmed?  N/A   Are transportation arrangements needed? No  If their condition worsens, is the pt aware to call PCP or go to the Emergency Dept.? Yes Was the patient provided with contact information for the PCP's office or ED? Yes Was to pt encouraged to call back with questions or concerns? Yes

## 2021-05-10 ENCOUNTER — Encounter: Payer: Self-pay | Admitting: Family Medicine

## 2021-05-11 DIAGNOSIS — A0472 Enterocolitis due to Clostridium difficile, not specified as recurrent: Secondary | ICD-10-CM

## 2021-05-11 HISTORY — DX: Enterocolitis due to Clostridium difficile, not specified as recurrent: A04.72

## 2021-05-15 ENCOUNTER — Encounter: Payer: Self-pay | Admitting: Family Medicine

## 2021-05-15 ENCOUNTER — Ambulatory Visit (INDEPENDENT_AMBULATORY_CARE_PROVIDER_SITE_OTHER): Payer: Medicare Other | Admitting: Family Medicine

## 2021-05-15 ENCOUNTER — Other Ambulatory Visit: Payer: Self-pay

## 2021-05-15 VITALS — BP 120/76 | HR 49 | Temp 97.7°F | Ht 73.0 in | Wt 183.1 lb

## 2021-05-15 DIAGNOSIS — R55 Syncope and collapse: Secondary | ICD-10-CM

## 2021-05-15 DIAGNOSIS — F0390 Unspecified dementia without behavioral disturbance: Secondary | ICD-10-CM | POA: Diagnosis not present

## 2021-05-15 DIAGNOSIS — N1831 Chronic kidney disease, stage 3a: Secondary | ICD-10-CM

## 2021-05-15 DIAGNOSIS — K5900 Constipation, unspecified: Secondary | ICD-10-CM | POA: Diagnosis not present

## 2021-05-15 MED ORDER — FINASTERIDE 5 MG PO TABS: 5.0000 mg | ORAL_TABLET | Freq: Every day | ORAL | 1 refills | Status: DC

## 2021-05-15 NOTE — Progress Notes (Signed)
Patient ID: Dylan [name], male    DOB: Dec 03, 1933, 87 y.o.   MRN: [id number]  This visit was conducted in person.  BP 120/76   Pulse (!) 49   Temp 97.7 F (36.5 C) (Temporal)   Ht 6\' 1"  (1.854 m)   Wt 183 lb 2 oz (83.1 kg)   SpO2 99%   BMI 24.16 kg/m    CC: hosp f/u visit  Subjective:   HPI: Dylan [name] is a 87 y.o. male presenting on 05/15/2021 for Hospitalization Follow-up (Admitted on 05/04/21 to [facility], dx syncope; constipation.  Pt accompanied by wife, [name]- temp 98.0.)   History largely obtained from wife due to pt's dementia.   Recent hospitalization at [facility] for near syncope while having a bowel movement presumed due to straining from constipation with rectal impaction. Treated with fleet enema, dulcolax suppositories, miralax. Discharged on daily senokot and miralax. CT scan showed mild prostate enlargement and possible proctitis - treated with augmentin. Dehydration was treated with IVF. Proscar started.   Loop recorder in place - no arrhythmia when interrogated.   Since home, very loose bowels at least twice daily on current regimen which is senokot 1 tab BID and miralax 17gm daily.  He is drinking some ensure but it's a struggle to make him drink fluids in general.   Doing well on midodrine 2.5mg  TID with meals.   Date of admission: 05/04/2021 Date of discharge: 05/06/2021 TCM hosp f/u phone call completed on 05/07/2021  Discharge diagnosis: Principal Problem:   Pre-syncope Active Problems:   Right bundle branch block with left anterior fascicular block   Thrombocytopenia (HCC)   Dementia without behavioral disturbance (HCC)   Constipation   Acute renal failure superimposed on stage 3 chronic kidney disease (HCC)   Proteinuria   Hypotension   Fecal impaction (HCC)   Dehydration     Relevant past medical, surgical, family and social history reviewed and updated as indicated. Interim medical history since our last visit reviewed. Allergies and  medications reviewed and updated. Outpatient Medications Prior to Visit  Medication Sig Dispense Refill   Cholecalciferol (VITAMIN D3) 1000 units CAPS Take 1 capsule (1,000 Units total) by mouth daily. 30 capsule    feeding supplement (ENSURE ENLIVE / ENSURE PLUS) LIQD Take 237 mLs by mouth 2 (two) times daily between meals. 14220 mL 0   guaiFENesin (MUCINEX) 600 MG 12 hr tablet Take 600 mg by mouth daily as needed.     memantine (NAMENDA XR) 14 MG CP24 24 hr capsule Take 1 capsule (14 mg total) by mouth daily. 90 capsule 3   midodrine (PROAMATINE) 2.5 MG tablet Take 1 tablet (2.5 mg total) by mouth 3 (three) times daily with meals. 90 tablet 11   polyethylene glycol (MIRALAX / GLYCOLAX) 17 g packet Take 17 g by mouth daily. 30 each 0   senna (SENOKOT) 8.6 MG TABS tablet Take 1 tablet (8.6 mg total) by mouth 2 (two) times daily. 60 tablet 0   sertraline (ZOLOFT) 50 MG tablet Take 1 tablet (50 mg total) by mouth daily. 90 tablet 1   vitamin B-12 (V-R VITAMIN B-12) 500 MCG tablet Take 1 tablet (500 mcg total) by mouth daily.     finasteride (PROSCAR) 5 MG tablet Take 1 tablet (5 mg total) by mouth daily. 30 tablet 0   No facility-administered medications prior to visit.     Per HPI unless specifically indicated in ROS section below Review of Systems  Objective:  BP 120/76  Pulse (!) 49   Temp 97.7 F (36.5 C) (Temporal)   Ht 6\' 1"  (1.854 m)   Wt 183 lb 2 oz (83.1 kg)   SpO2 99%   BMI 24.16 kg/m   Wt Readings from Last 3 Encounters:  05/15/21 183 lb 2 oz (83.1 kg)  05/04/21 179 lb 7.3 oz (81.4 kg)  02/14/21 184 lb 4 oz (83.6 kg)      Physical Exam Vitals and nursing note reviewed.  Constitutional:      Appearance: Normal appearance. He is not ill-appearing.  Eyes:     Extraocular Movements: Extraocular movements intact.     Pupils: Pupils are equal, round, and reactive to light.  Neck:     Thyroid: No thyroid mass or thyromegaly.  Cardiovascular:     Rate and Rhythm:  Normal rate and regular rhythm.     Pulses: Normal pulses.     Heart sounds: Normal heart sounds. No murmur heard. Pulmonary:     Effort: Pulmonary effort is normal. No respiratory distress.     Breath sounds: Normal breath sounds. No wheezing, rhonchi or rales.  Musculoskeletal:     Cervical back: Normal range of motion and neck supple. No rigidity.     Right lower leg: No edema.     Left lower leg: No edema.  Lymphadenopathy:     Cervical: No cervical adenopathy.  Skin:    General: Skin is warm and dry.     Findings: No rash.  Neurological:     Mental Status: He is alert.  Psychiatric:        Mood and Affect: Mood normal.        Behavior: Behavior normal.      Lab Results  Component Value Date   CREATININE 1.40 (H) 05/05/2021   BUN 19 05/05/2021   NA 139 05/05/2021   K 3.8 05/05/2021   CL 106 05/05/2021   CO2 25 05/05/2021    Assessment & Plan:  This visit occurred during the SARS-CoV-2 public health emergency.  Safety protocols were in place, including screening questions prior to the visit, additional usage of staff PPE, and extensive cleaning of exam room while observing appropriate contact time as indicated for disinfecting solutions.   Problem List Items Addressed This Visit     Dementia without behavioral disturbance (HCC)    Alz vs FTD - continue namenda for now along with sertraline for h/o sexual disinhibition        CKD (chronic kidney disease) stage 3, GFR 30-59 ml/min (HCC)    Discharge GFR 49.        Pre-syncope - Primary    Thought due to fecal impaction/constipation.  labwork and telemetry reassuring.  Now on bowel regimen. Continue.        Constipation    Now with too loose stools on current bowel regimen  Recommend drop miralax to 8.5gm/day and senokot to 1 tab daily.  Update with effect.           Meds ordered this encounter  Medications   finasteride (PROSCAR) 5 MG tablet    Sig: Take 1 tablet (5 mg total) by mouth daily.     Dispense:  90 tablet    Refill:  1    No orders of the defined types were placed in this encounter.   Patient Instructions  Change bowel regimen to 1/2 capful miralax daily and senokot 1 tablet nightly.  Update me if any trouble with regularizing stools Continue encouraging good water intake.  Good to see you today. Keep October appointment.  Follow up plan: Return if symptoms worsen or fail to improve.  [name], MD

## 2021-05-15 NOTE — Patient Instructions (Addendum)
Change bowel regimen to 1/2 capful miralax daily and senokot 1 tablet nightly.  Update me if any trouble with regularizing stools Continue encouraging good water intake.  Good to see you today. Keep October appointment.

## 2021-05-16 ENCOUNTER — Encounter: Payer: Self-pay | Admitting: Family Medicine

## 2021-05-16 NOTE — Assessment & Plan Note (Signed)
Alz vs FTD - continue namenda for now along with sertraline for h/o sexual disinhibition

## 2021-05-16 NOTE — Assessment & Plan Note (Addendum)
Thought due to fecal impaction/constipation.  labwork and telemetry reassuring.  Now on bowel regimen. Continue.

## 2021-05-16 NOTE — Assessment & Plan Note (Signed)
Now with too loose stools on current bowel regimen  Recommend drop miralax to 8.5gm/day and senokot to 1 tab daily.  Update with effect.

## 2021-05-16 NOTE — Assessment & Plan Note (Signed)
Discharge GFR 49.

## 2021-05-24 NOTE — Progress Notes (Signed)
Carelink Summary Report / Loop Recorder 

## 2021-06-04 ENCOUNTER — Ambulatory Visit
Admission: RE | Admit: 2021-06-04 | Discharge: 2021-06-04 | Disposition: A | Discharge status: Home / Self Care | Payer: Medicare Other | Source: Ambulatory Visit | Attending: Family Medicine | Admitting: Family Medicine

## 2021-06-04 ENCOUNTER — Other Ambulatory Visit: Payer: Self-pay | Admitting: Family Medicine

## 2021-06-04 ENCOUNTER — Encounter: Payer: Self-pay | Admitting: Family Medicine

## 2021-06-04 ENCOUNTER — Other Ambulatory Visit: Payer: Self-pay

## 2021-06-04 ENCOUNTER — Telehealth: Payer: Self-pay

## 2021-06-04 ENCOUNTER — Ambulatory Visit (INDEPENDENT_AMBULATORY_CARE_PROVIDER_SITE_OTHER): Payer: Medicare Other | Admitting: Family Medicine

## 2021-06-04 VITALS — BP 120/60 | HR 38 | Temp 97.3°F | Ht 73.0 in | Wt 182.1 lb

## 2021-06-04 DIAGNOSIS — F0391 Unspecified dementia with behavioral disturbance: Secondary | ICD-10-CM

## 2021-06-04 DIAGNOSIS — L89309 Pressure ulcer of unspecified buttock, unspecified stage: Secondary | ICD-10-CM | POA: Insufficient documentation

## 2021-06-04 DIAGNOSIS — N433 Hydrocele, unspecified: Secondary | ICD-10-CM

## 2021-06-04 DIAGNOSIS — R197 Diarrhea, unspecified: Secondary | ICD-10-CM

## 2021-06-04 DIAGNOSIS — L89151 Pressure ulcer of sacral region, stage 1: Secondary | ICD-10-CM

## 2021-06-04 DIAGNOSIS — R21 Rash and other nonspecific skin eruption: Secondary | ICD-10-CM | POA: Insufficient documentation

## 2021-06-04 DIAGNOSIS — K5900 Constipation, unspecified: Secondary | ICD-10-CM

## 2021-06-04 DIAGNOSIS — R001 Bradycardia, unspecified: Secondary | ICD-10-CM | POA: Diagnosis not present

## 2021-06-04 DIAGNOSIS — N50812 Left testicular pain: Secondary | ICD-10-CM | POA: Diagnosis not present

## 2021-06-04 DIAGNOSIS — A0472 Enterocolitis due to Clostridium difficile, not specified as recurrent: Secondary | ICD-10-CM | POA: Insufficient documentation

## 2021-06-04 DIAGNOSIS — L89159 Pressure ulcer of sacral region, unspecified stage: Secondary | ICD-10-CM | POA: Insufficient documentation

## 2021-06-04 NOTE — Assessment & Plan Note (Signed)
This complicates issues.  Alz vs FTD on namenda and sertraline

## 2021-06-04 NOTE — Assessment & Plan Note (Signed)
Worse over the weekend - persists despite stopping miralax and senakot. Recent augmentin course. Check GI pathogen panel r/o infective colitis.

## 2021-06-04 NOTE — Assessment & Plan Note (Addendum)
Marked bradycardia noted today however no recent syncope. Next EP f/u scheduled for 08/2021. Has not seen primary cardiology team [facility]) since 06/2020. Wife says has had difficulty scheduling appt. Will re refer to try and get back in either sooner with EP or back with primary cardiology team.

## 2021-06-04 NOTE — Assessment & Plan Note (Signed)
H/o this, now with pain - see below.

## 2021-06-04 NOTE — Assessment & Plan Note (Signed)
Discussed continued skin care measures.

## 2021-06-04 NOTE — Assessment & Plan Note (Signed)
Exam with swollen tender left testicle. ?epididymo-orchitis, r/o testicular torsion - I will order stat US/doppler.  Unable to leave urine sample - sterile cup provided for wife to return once collected.

## 2021-06-04 NOTE — Telephone Encounter (Signed)
Received call from ultrasound to give call report. I have given Lattie Haw information on patient to let DR. G know.

## 2021-06-04 NOTE — Progress Notes (Signed)
Patient ID: Dylan [name], male    DOB: 19-Jan-1934, 87 y.o.   MRN: [id number]  This visit was conducted in person.  BP 120/60   Pulse (!) 38   Temp (!) 97.3 F (36.3 C) (Temporal)   Ht '6\' 1"'$  (1.854 m)   Wt 182 lb 1 oz (82.6 kg)   SpO2 98%   BMI 24.02 kg/m    CC: diarrhea, genital pain Subjective:   HPI: Dylan [name] is a 87 y.o. male presenting on 06/04/2021 for Diarrhea (C/o diarrhea with bloody mucous.  Sxs started 06/01/21.  Also, complains of genital pain, per pt wife, when sitting/standing.  Pt accompanied by wife, Dylan [name]- temp 97.9.)   History obtained from wife due to patient's dementia.  Recent hospitalization last month for near syncope related to straining due to constipation/fecal impaction. Treated at the time with fleet enema, dulcolax suppositories, miralax, home bowel regimen was senokot and miralax. CT scan showed mild prostate enlargement and possible proctitis - treated with augmentin course. Proscar started.   Continued senokot 1 tab daily and miralax 8.5gm daily. This was stopped because bowel movements continued loose to watery. Wife started giving him crackers, baked potatoes, vegetables, apple sauce with mild improvement. Notes watery bloody stool with mucous this past weekend. Also started having pain to private area over the weekend.   He's been drinking some high protein boost. Ongoing difficulty with fluid intake.   Known RBBB and LAFB, loop recorder in place, continues midodrine 2.'5mg'$  tid.  Progressive bradycardia noted however no recurrent syncope.      Relevant past medical, surgical, family and social history reviewed and updated as indicated. Interim medical history since our last visit reviewed. Allergies and medications reviewed and updated. Outpatient Medications Prior to Visit  Medication Sig Dispense Refill   Cholecalciferol (VITAMIN D3) 1000 units CAPS Take 1 capsule (1,000 Units total) by mouth daily. 30 capsule    feeding supplement  (ENSURE ENLIVE / ENSURE PLUS) LIQD Take 237 mLs by mouth 2 (two) times daily between meals. 14220 mL 0   finasteride (PROSCAR) 5 MG tablet Take 1 tablet (5 mg total) by mouth daily. 90 tablet 1   guaiFENesin (MUCINEX) 600 MG 12 hr tablet Take 600 mg by mouth daily as needed.     memantine (NAMENDA XR) 14 MG CP24 24 hr capsule Take 1 capsule (14 mg total) by mouth daily. 90 capsule 3   midodrine (PROAMATINE) 2.5 MG tablet Take 1 tablet (2.5 mg total) by mouth 3 (three) times daily with meals. 90 tablet 11   sertraline (ZOLOFT) 50 MG tablet Take 1 tablet (50 mg total) by mouth daily. 90 tablet 1   vitamin B-12 (V-R VITAMIN B-12) 500 MCG tablet Take 1 tablet (500 mcg total) by mouth daily.     polyethylene glycol (MIRALAX / GLYCOLAX) 17 g packet Take 17 g by mouth daily. (Patient not taking: Reported on 06/04/2021) 30 each 0   senna (SENOKOT) 8.6 MG TABS tablet Take 1 tablet (8.6 mg total) by mouth 2 (two) times daily. (Patient not taking: Reported on 06/04/2021) 60 tablet 0   No facility-administered medications prior to visit.     Per HPI unless specifically indicated in ROS section below Review of Systems  Objective:  BP 120/60   Pulse (!) 38   Temp (!) 97.3 F (36.3 C) (Temporal)   Ht '6\' 1"'$  (1.854 m)   Wt 182 lb 1 oz (82.6 kg)   SpO2 98%   BMI 24.02  kg/m   Wt Readings from Last 3 Encounters:  06/04/21 182 lb 1 oz (82.6 kg)  05/15/21 183 lb 2 oz (83.1 kg)  05/04/21 179 lb 7.3 oz (81.4 kg)      Physical Exam Vitals and nursing note reviewed.  Constitutional:      Appearance: Normal appearance. He is not ill-appearing.  Cardiovascular:     Rate and Rhythm: Regular rhythm. Bradycardia present.     Pulses: Normal pulses.     Heart sounds: Normal heart sounds. No murmur heard. Pulmonary:     Effort: Pulmonary effort is normal. No respiratory distress.     Breath sounds: Normal breath sounds. No wheezing, rhonchi or rales.  Abdominal:     Hernia: There is no hernia in the left  inguinal area or right inguinal area.  Genitourinary:    Pubic Area: No rash.      Penis: Normal and uncircumcised. No phimosis, discharge or swelling.      Testes:        Right: Mass, tenderness or swelling not present. Right testis is descended. Cremasteric reflex is present.         Left: Tenderness and swelling present. Left testis is descended.     Epididymis:     Right: Normal.     Rectum: No external hemorrhoid.     Comments:  Marked tenderness and swelling to L testicle  Erythema of skin with small sore to L>R upper buttock Musculoskeletal:     Right lower leg: No edema.     Left lower leg: No edema.  Lymphadenopathy:     Lower Body: No right inguinal adenopathy. No left inguinal adenopathy.  Skin:    General: Skin is warm and dry.     Findings: No rash.  Neurological:     Mental Status: He is alert.  Psychiatric:        Mood and Affect: Mood normal.        Behavior: Behavior normal.       Assessment & Plan:  This visit occurred during the SARS-CoV-2 public health emergency.  Safety protocols were in place, including screening questions prior to the visit, additional usage of staff PPE, and extensive cleaning of exam room while observing appropriate contact time as indicated for disinfecting solutions.   Problem List Items Addressed This Visit     HYDROCELE, LEFT    H/o this, now with pain - see below.        Dementia with behavioral disturbance ([place])    This complicates issues.  Alz vs FTD on namenda and sertraline        Constipation    Now predominant diarrhea - now off senokot and miralax       Watery diarrhea - Primary    Worse over the weekend - persists despite stopping miralax and senakot. Recent augmentin course. Check GI pathogen panel r/o infective colitis.        Relevant Orders   Gastrointestinal Pathogen Panel PCR   Left testicular pain    Exam with swollen tender left testicle. ?epididymo-orchitis, r/o testicular torsion - I will order  stat US/doppler.  Unable to leave urine sample - sterile cup provided for wife to return once collected.        Relevant Orders   US SCROTUM W/DOPPLER   Sacral pressure sore    Discussed continued skin care measures.        Bradycardia    Marked bradycardia noted today however no recent syncope. Next EP  f/u scheduled for 08/2021. Has not seen primary cardiology team [facility]) since 06/2020. Wife says has had difficulty scheduling appt. Will re refer to try and get back in either sooner with EP or back with primary cardiology team.        Relevant Orders   Ambulatory referral to Cardiology     No orders of the defined types were placed in this encounter.  Orders Placed This Encounter  Procedures   US SCROTUM W/DOPPLER    Standing Status:   Future    Standing Expiration Date:   06/04/2022    Order Specific Question:   Reason for Exam (SYMPTOM  OR DIAGNOSIS REQUIRED)    Answer:   left testicular pain/swelling    Order Specific Question:   Preferred imaging location?    Answer:   [facility]   Gastrointestinal Pathogen Panel PCR   Ambulatory referral to Cardiology    Referral Priority:   Routine    Referral Type:   Consultation    Referral Reason:   Specialty Services Required    Requested Specialty:   Cardiology    Number of Visits Requested:   1    Patient Instructions  Urine test today - if unable to complete then take cup home and drop [place] off a sample  We will send you home with stool test to rule out infection  I'd like to check scrotal ultrasound given left testicle swollen and tender.  Stay off bowel regimen for now.   Follow up plan: No follow-ups on file.  [name], MD

## 2021-06-04 NOTE — Patient Instructions (Signed)
Urine test today - if unable to complete then take cup home and drop [place] off a sample  We will send you home with stool test to rule out infection  I'd like to check scrotal ultrasound given left testicle swollen and tender.  Stay off bowel regimen for now.

## 2021-06-04 NOTE — Assessment & Plan Note (Signed)
Now predominant diarrhea - now off senokot and miralax

## 2021-06-05 ENCOUNTER — Other Ambulatory Visit: Payer: Medicare Other

## 2021-06-05 DIAGNOSIS — R197 Diarrhea, unspecified: Secondary | ICD-10-CM | POA: Diagnosis not present

## 2021-06-05 LAB — POC URINALSYSI DIPSTICK (AUTOMATED)
Blood, UA: NEGATIVE
Glucose, UA: NEGATIVE
Leukocytes, UA: NEGATIVE
Nitrite, UA: NEGATIVE
Protein, UA: POSITIVE — AB
Spec Grav, UA: >=1.03 — AB (ref 1.010–1.025)
Urobilinogen, UA: 0.2 E.U./dL
pH, UA: 5.5 (ref 5.0–8.0)

## 2021-06-05 NOTE — Addendum Note (Signed)
Addended by: Brenton Grills on: [id number] PM   Modules accepted: Orders

## 2021-06-06 ENCOUNTER — Encounter: Payer: Self-pay | Admitting: Family Medicine

## 2021-06-07 ENCOUNTER — Encounter: Payer: Self-pay | Admitting: Family Medicine

## 2021-06-07 ENCOUNTER — Other Ambulatory Visit: Payer: Self-pay | Admitting: Family Medicine

## 2021-06-07 ENCOUNTER — Ambulatory Visit (INDEPENDENT_AMBULATORY_CARE_PROVIDER_SITE_OTHER): Payer: Medicare Other

## 2021-06-07 DIAGNOSIS — R55 Syncope and collapse: Secondary | ICD-10-CM | POA: Diagnosis not present

## 2021-06-07 MED ORDER — VANCOMYCIN HCL 125 MG PO CAPS: 125.0000 mg | ORAL_CAPSULE | Freq: Four times a day (QID) | ORAL | 0 refills | Status: AC

## 2021-06-07 NOTE — Telephone Encounter (Signed)
Replied via lab result

## 2021-06-08 LAB — CUP PACEART REMOTE DEVICE CHECK
Date Time Interrogation Session: 20220726042534
Implantable Pulse Generator Implant Date: 20191120

## 2021-06-08 LAB — GASTROINTESTINAL PATHOGEN PANEL PCR
C. difficile Tox A/B, PCR: DETECTED — AB
Campylobacter, PCR: NOT DETECTED
Cryptosporidium, PCR: NOT DETECTED
E coli (ETEC) LT/ST PCR: NOT DETECTED
E coli (STEC) stx1/stx2, PCR: NOT DETECTED
E coli 0157, PCR: NOT DETECTED
Giardia lamblia, PCR: NOT DETECTED
Norovirus, PCR: NOT DETECTED
Rotavirus A, PCR: NOT DETECTED
Salmonella, PCR: NOT DETECTED
Shigella, PCR: NOT DETECTED

## 2021-06-11 ENCOUNTER — Encounter: Payer: Self-pay | Admitting: Family Medicine

## 2021-06-23 ENCOUNTER — Inpatient Hospital Stay
Admission: EM | Admit: 2021-06-23 | Discharge: 2021-06-28 | DRG: 872 | Disposition: A | Discharge status: Home / Self Care | Payer: Medicare Other | Attending: Internal Medicine | Admitting: Internal Medicine

## 2021-06-23 ENCOUNTER — Emergency Department: Payer: Medicare Other

## 2021-06-23 ENCOUNTER — Other Ambulatory Visit: Payer: Self-pay

## 2021-06-23 DIAGNOSIS — Z8249 Family history of ischemic heart disease and other diseases of the circulatory system: Secondary | ICD-10-CM | POA: Diagnosis not present

## 2021-06-23 DIAGNOSIS — F32A Depression, unspecified: Secondary | ICD-10-CM | POA: Diagnosis not present

## 2021-06-23 DIAGNOSIS — A0472 Enterocolitis due to Clostridium difficile, not specified as recurrent: Secondary | ICD-10-CM | POA: Diagnosis present

## 2021-06-23 DIAGNOSIS — Z87891 Personal history of nicotine dependence: Secondary | ICD-10-CM | POA: Diagnosis not present

## 2021-06-23 DIAGNOSIS — D72829 Elevated white blood cell count, unspecified: Secondary | ICD-10-CM | POA: Diagnosis not present

## 2021-06-23 DIAGNOSIS — R197 Diarrhea, unspecified: Secondary | ICD-10-CM | POA: Diagnosis not present

## 2021-06-23 DIAGNOSIS — I739 Peripheral vascular disease, unspecified: Secondary | ICD-10-CM | POA: Diagnosis present

## 2021-06-23 DIAGNOSIS — Z79899 Other long term (current) drug therapy: Secondary | ICD-10-CM | POA: Diagnosis not present

## 2021-06-23 DIAGNOSIS — Z20822 Contact with and (suspected) exposure to covid-19: Secondary | ICD-10-CM | POA: Diagnosis not present

## 2021-06-23 DIAGNOSIS — R0902 Hypoxemia: Secondary | ICD-10-CM | POA: Diagnosis not present

## 2021-06-23 DIAGNOSIS — N4341 Spermatocele of epididymis, single: Secondary | ICD-10-CM | POA: Diagnosis present

## 2021-06-23 DIAGNOSIS — Z9842 Cataract extraction status, left eye: Secondary | ICD-10-CM

## 2021-06-23 DIAGNOSIS — G301 Alzheimer's disease with late onset: Secondary | ICD-10-CM | POA: Diagnosis not present

## 2021-06-23 DIAGNOSIS — F0281 Dementia in other diseases classified elsewhere with behavioral disturbance: Secondary | ICD-10-CM

## 2021-06-23 DIAGNOSIS — Z888 Allergy status to other drugs, medicaments and biological substances status: Secondary | ICD-10-CM

## 2021-06-23 DIAGNOSIS — E538 Deficiency of other specified B group vitamins: Secondary | ICD-10-CM | POA: Diagnosis present

## 2021-06-23 DIAGNOSIS — R6520 Severe sepsis without septic shock: Secondary | ICD-10-CM | POA: Diagnosis present

## 2021-06-23 DIAGNOSIS — Z806 Family history of leukemia: Secondary | ICD-10-CM | POA: Diagnosis not present

## 2021-06-23 DIAGNOSIS — E559 Vitamin D deficiency, unspecified: Secondary | ICD-10-CM | POA: Diagnosis present

## 2021-06-23 DIAGNOSIS — E785 Hyperlipidemia, unspecified: Secondary | ICD-10-CM | POA: Diagnosis not present

## 2021-06-23 DIAGNOSIS — Z89432 Acquired absence of left foot: Secondary | ICD-10-CM | POA: Diagnosis not present

## 2021-06-23 DIAGNOSIS — N183 Chronic kidney disease, stage 3 unspecified: Secondary | ICD-10-CM | POA: Diagnosis not present

## 2021-06-23 DIAGNOSIS — R55 Syncope and collapse: Secondary | ICD-10-CM

## 2021-06-23 DIAGNOSIS — A419 Sepsis, unspecified organism: Principal | ICD-10-CM | POA: Diagnosis present

## 2021-06-23 DIAGNOSIS — N1831 Chronic kidney disease, stage 3a: Secondary | ICD-10-CM | POA: Diagnosis not present

## 2021-06-23 DIAGNOSIS — Z823 Family history of stroke: Secondary | ICD-10-CM

## 2021-06-23 DIAGNOSIS — Z743 Need for continuous supervision: Secondary | ICD-10-CM | POA: Diagnosis not present

## 2021-06-23 DIAGNOSIS — I951 Orthostatic hypotension: Secondary | ICD-10-CM | POA: Diagnosis not present

## 2021-06-23 DIAGNOSIS — K529 Noninfective gastroenteritis and colitis, unspecified: Secondary | ICD-10-CM

## 2021-06-23 DIAGNOSIS — R404 Transient alteration of awareness: Secondary | ICD-10-CM | POA: Diagnosis not present

## 2021-06-23 DIAGNOSIS — H9193 Unspecified hearing loss, bilateral: Secondary | ICD-10-CM | POA: Diagnosis present

## 2021-06-23 DIAGNOSIS — F03918 Unspecified dementia, unspecified severity, with other behavioral disturbance: Secondary | ICD-10-CM | POA: Diagnosis present

## 2021-06-23 DIAGNOSIS — K6389 Other specified diseases of intestine: Secondary | ICD-10-CM | POA: Diagnosis not present

## 2021-06-23 DIAGNOSIS — W19XXXA Unspecified fall, initial encounter: Secondary | ICD-10-CM | POA: Diagnosis not present

## 2021-06-23 DIAGNOSIS — F0391 Unspecified dementia with behavioral disturbance: Secondary | ICD-10-CM | POA: Diagnosis present

## 2021-06-23 DIAGNOSIS — F03911 Unspecified dementia, unspecified severity, with agitation: Secondary | ICD-10-CM | POA: Diagnosis present

## 2021-06-23 DIAGNOSIS — F0390 Unspecified dementia without behavioral disturbance: Secondary | ICD-10-CM | POA: Diagnosis present

## 2021-06-23 DIAGNOSIS — N3289 Other specified disorders of bladder: Secondary | ICD-10-CM | POA: Diagnosis not present

## 2021-06-23 DIAGNOSIS — Z85828 Personal history of other malignant neoplasm of skin: Secondary | ICD-10-CM

## 2021-06-23 LAB — URINALYSIS, COMPLETE (UACMP) WITH MICROSCOPIC
Bacteria, UA: NONE SEEN
Bilirubin Urine: NEGATIVE
Glucose, UA: NEGATIVE mg/dL
Hgb urine dipstick: NEGATIVE
Ketones, ur: NEGATIVE mg/dL
Leukocytes,Ua: NEGATIVE
Nitrite: NEGATIVE
Protein, ur: NEGATIVE mg/dL
Specific Gravity, Urine: 1.023 (ref 1.005–1.030)
Squamous Epithelial / HPF: NONE SEEN (ref 0–5)
pH: 6 (ref 5.0–8.0)

## 2021-06-23 LAB — BASIC METABOLIC PANEL
Anion gap: 13 (ref 5–15)
BUN: 16 mg/dL (ref 8–23)
CO2: 26 mmol/L (ref 22–32)
Calcium: 9.3 mg/dL (ref 8.9–10.3)
Chloride: 101 mmol/L (ref 98–111)
Creatinine, Ser: 1.54 mg/dL — ABNORMAL HIGH (ref 0.61–1.24)
GFR, Estimated: 44 mL/min — ABNORMAL LOW (ref >60)
Glucose, Bld: 165 mg/dL — ABNORMAL HIGH (ref 70–99)
Potassium: 4.6 mmol/L (ref 3.5–5.1)
Sodium: 140 mmol/L (ref 135–145)

## 2021-06-23 LAB — CBC
HCT: 46.9 % (ref 39.0–52.0)
Hemoglobin: 16.1 g/dL (ref 13.0–17.0)
MCH: 32.7 pg (ref 26.0–34.0)
MCHC: 34.3 g/dL (ref 30.0–36.0)
MCV: 95.3 fL (ref 80.0–100.0)
Platelets: 123 10*3/uL — ABNORMAL LOW (ref 150–400)
RBC: 4.92 MIL/uL (ref 4.22–5.81)
RDW: 13 % (ref 11.5–15.5)
WBC: 17.1 10*3/uL — ABNORMAL HIGH (ref 4.0–10.5)
nRBC: 0 % (ref 0.0–0.2)

## 2021-06-23 LAB — TROPONIN I (HIGH SENSITIVITY)
Troponin I (High Sensitivity): 9 ng/L (ref <18)
Troponin I (High Sensitivity): 9 ng/L (ref <18)

## 2021-06-23 LAB — LACTIC ACID, PLASMA
Lactic Acid, Venous: 2 mmol/L (ref 0.5–1.9)
Lactic Acid, Venous: 1.5 mmol/L (ref 0.5–1.9)

## 2021-06-23 LAB — RESP PANEL BY RT-PCR (FLU A&B, COVID) ARPGX2
Influenza A by PCR: NEGATIVE
Influenza B by PCR: NEGATIVE
SARS Coronavirus 2 by RT PCR: NEGATIVE

## 2021-06-23 MED ORDER — ACETAMINOPHEN 325 MG PO TABS
650.0000 mg | ORAL_TABLET | Freq: Four times a day (QID) | ORAL | Status: DC | PRN
  Administered 2021-06-23 – 2021-06-24 (×2): 650 mg via ORAL
  Filled 2021-06-23 (×2): qty 2

## 2021-06-23 MED ORDER — METRONIDAZOLE 500 MG/100ML IV SOLN
500.0000 mg | Freq: Three times a day (TID) | INTRAVENOUS | Status: DC
  Administered 2021-06-23 – 2021-06-26 (×9): 500 mg via INTRAVENOUS
  Filled 2021-06-23 (×11): qty 100

## 2021-06-23 MED ORDER — METRONIDAZOLE 500 MG/100ML IV SOLN
500.0000 mg | Freq: Once | INTRAVENOUS | Status: AC
  Administered 2021-06-23: 500 mg via INTRAVENOUS
  Filled 2021-06-23: qty 100

## 2021-06-23 MED ORDER — MIDODRINE HCL 5 MG PO TABS
2.5000 mg | ORAL_TABLET | Freq: Three times a day (TID) | ORAL | Status: DC
  Administered 2021-06-23 – 2021-06-28 (×15): 2.5 mg via ORAL
  Filled 2021-06-23 (×14): qty 1

## 2021-06-23 MED ORDER — ACETAMINOPHEN 650 MG RE SUPP: 650.0000 mg | Freq: Four times a day (QID) | RECTAL | Status: DC | PRN

## 2021-06-23 MED ORDER — SODIUM CHLORIDE 0.9 % IV BOLUS
500.0000 mL | Freq: Once | INTRAVENOUS | Status: AC
  Administered 2021-06-23: 500 mL via INTRAVENOUS

## 2021-06-23 MED ORDER — LACTATED RINGERS IV BOLUS
1000.0000 mL | Freq: Once | INTRAVENOUS | Status: AC
  Administered 2021-06-23: 1000 mL via INTRAVENOUS

## 2021-06-23 MED ORDER — ONDANSETRON HCL 4 MG PO TABS: 4.0000 mg | ORAL_TABLET | Freq: Four times a day (QID) | ORAL | Status: DC | PRN

## 2021-06-23 MED ORDER — ONDANSETRON HCL 4 MG/2ML IJ SOLN: 4.0000 mg | Freq: Four times a day (QID) | INTRAMUSCULAR | Status: DC | PRN

## 2021-06-23 MED ORDER — LACTATED RINGERS IV SOLN: INTRAVENOUS | Status: AC

## 2021-06-23 MED ORDER — METRONIDAZOLE 500 MG/100ML IV SOLN: 500.0000 mg | Freq: Four times a day (QID) | INTRAVENOUS | Status: DC

## 2021-06-23 NOTE — ED Triage Notes (Signed)
First nurse note: Pt comes ems from home for syncope witnessed by wife. Had some diarrhea this morning. When getting him cleaned, he syncopized. LOC for "a few mins". Hx of dementia and wife reports baseline after episode. Has pacemaker/defibrillator. BP 99/64. Hr 70. CBG 147.

## 2021-06-23 NOTE — ED Notes (Signed)
Blood culture number one drawn via 21 gauge butterfly from R wrist, and blood culture number two drawn via 21 gauge butterfly from L ac. Samples sent to lab

## 2021-06-23 NOTE — ED Notes (Signed)
Pt in bed, pt denies pain, wife at bedside, report to [facility], pt to room number 35

## 2021-06-23 NOTE — Assessment & Plan Note (Signed)
Likely due to colitis. Repeat CBC in AM.

## 2021-06-23 NOTE — ED Notes (Signed)
Date and time results received: 06/23/21 1243   Test: Lactate Critical Value: 2  Name of Provider Notified: Tamala Julian

## 2021-06-23 NOTE — ED Provider Notes (Signed)
[facility] Emergency Department Provider Note ____________________________________________   Event Date/Time   First MD Initiated Contact with Patient 06/23/21 1042     (approximate)  I have reviewed the triage vital signs and the nursing notes.  HISTORY  Chief Complaint Loss of Consciousness   HPI Dylan [name] is a 87 y.o. malewho presents to the ED for evaluation of syncope and diarrhea.  Chart review indicates history of dementia, residing at home with his wife who cares for him. Medical admission 2 months ago due to syncope or near syncope in the setting of straining with constipation and fecal impaction. Recent diagnosis of C. difficile on 7/26 via patient's PCP where he was prescribed oral vancomycin.  His wife reports patient just finished his oral vancomycin 1 week ago today.  Was a 10-day course of vancomycin 4 times daily.  Wife reports that patient's stools have formed up and his diarrhea has resolved with the oral vancomycin, and the patient has been doing well over the past week, until early this morning.  Wife reports noting significant watery diarrhea throughout all of the bedroom, patient's bed, the floors and the bathroom this morning.  She reports a foul stench and reminiscent of his C. difficile last month.  She reports getting into the shower and cleaning him off when he had a possible syncopal episode again this morning.  He reports his eyes glazing over and he slumped down on the shower chair.  No seizure activity.  He regained consciousness after 1-2 minutes and was at his behavioral baseline.  Here in the ED, patient reports feeling well has no complaints.  He is disoriented and history is limited due to his mental status.  Past Medical History:  Diagnosis Date   Basal cell carcinoma 08/31/2020   Mid forehead - ED&C    Basal cell carcinoma of right nasal sidewall 12/15/2014   Bilateral hearing loss 08/03/2018   S/p audiology  evaluation, has new hearing aides.   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   Mohs defect of eyelid 12/14/2014   Nuclear sclerosis, left 04/01/2017   Obstruction of right lacrimal duct 10/16/2015   PAD (peripheral artery disease) ([place]) 03/21/2020   Abnormal ABI, abnormal arterial duplex 03/2020:  R - moderate RLE disease, abnormal TBI L - WNL, abnormal TBI Referred for vascular consult - rec medical management   Pruritus 02/19/2020   Recurrent syncope 06/22/2018   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   Right leg swelling 02/19/2020   Sepsis ([place]) 05/30/2020   Thrombocytopenia ([place]) 02/21/2011   periph smear stable 02/2020   Vitamin B12 deficiency 08/03/2018   Completed b12 shots 09/2018, maintaining levels on oral replacement (dissolvable tablets) 01/2019   Vitamin D deficiency 05/19/2018    Patient Active Problem List   Diagnosis Date Noted   Clostridium difficile colitis 06/04/2021   Left testicular pain 06/04/2021   Sacral pressure  sore 06/04/2021   Bradycardia 06/04/2021   Fecal impaction (HCC)    Pre-syncope 05/04/2021   Constipation 05/04/2021   Proteinuria 05/04/2021   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   PAD (peripheral artery disease) ([place]) 03/21/2020   Right leg swelling 02/19/2020   Dysphagia 02/19/2020   Pruritus 02/19/2020   Medicare annual wellness visit, subsequent 08/11/2019   CKD  (chronic kidney disease) stage 3, GFR 30-59 ml/min ([place]) 01/13/2019   Health maintenance examination 08/03/2018   Advanced care planning/counseling discussion 08/03/2018   Vitamin B12 deficiency 08/03/2018   Bilateral hearing loss 08/03/2018   Recurrent syncope 06/22/2018   Vitamin D deficiency 05/19/2018   Fall 05/19/2018   Dementia with behavioral disturbance ([place]) 05/19/2018   Cataract extraction status 03/06/2017   Obstruction of right lacrimal duct 10/16/2015   Exposure keratopathy, 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   Eversion of right lacrimal punctum 05/17/2015   Epiphora due to insufficient drainage of right side 05/17/2015   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   Basal cell carcinoma of right nasal sidewall 12/15/2014   Mohs defect of tear duct of right eye 12/15/2014   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/2014   Mohs defect of eyelid 12/14/2014   Thrombocytopenia ([place]) 02/21/2011   Dyslipidemia 02/04/2008   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   ERECTILE DYSFUNCTION, MILD 02/03/2007   Spermatocele of epididymis, single 02/03/2007    Past Surgical History:  Procedure Laterality Date   BASAL CELL CARCINOMA EXCISION Right 01/2015   with reconstructive surgery around eye   CATARACT EXTRACTION Left 03/2017   ETT  09/2018   negative for ischemia, extremely poor exercise tolerance ([place])   FOOT SURGERY Left 1998   metatarsal amputation after injury   KNEE ARTHROSCOPY  1993   Right   KNEE ARTHROSCOPY  01/2001   Left   LOOP RECORDER INSERTION N/A 09/30/2018   Procedure: LOOP RECORDER INSERTION;  Surgeon: Constance Haw, MD;  Location: [place] [facility];  Service: Cardiovascular;  Laterality: N/A;    Prior to Admission medications   Medication Sig Start Date End Date Taking? Authorizing Provider  Cholecalciferol (VITAMIN D3) 1000 units CAPS Take 1 capsule (1,000 Units total) by mouth daily. 08/03/18   [name],  MD  feeding supplement (ENSURE ENLIVE / ENSURE PLUS) LIQD Take 237 mLs by mouth 2 (two) times daily between meals. 05/06/21   Loletha Grayer, MD  finasteride (PROSCAR) 5 MG tablet Take 1 tablet (5 mg total) by mouth daily. 05/15/21   [name], MD  guaiFENesin (MUCINEX) 600 MG 12 hr tablet Take 600 mg by mouth daily as needed.    [provider]  memantine (NAMENDA XR) 14 MG CP24 24 hr capsule Take 1 capsule (14 mg total) by mouth daily. 08/16/20   [name], MD  midodrine (PROAMATINE) 2.5 MG tablet Take 1 tablet (2.5 mg total) by mouth 3 (three) times daily with meals. 08/16/20   [name], MD  polyethylene glycol (MIRALAX / GLYCOLAX) 17 g packet Take 17 g by mouth daily. Patient not taking: Reported on 06/04/2021 05/06/21   Loletha Grayer, MD  senna (SENOKOT) 8.6 MG TABS tablet Take 1 tablet (8.6 mg total) by mouth 2 (two) times daily. Patient not taking: Reported on 06/04/2021 05/06/21   Loletha Grayer, MD  sertraline (ZOLOFT) 50 MG tablet Take 1 tablet (50 mg total) by mouth daily. 02/14/21   [name], MD  vitamin B-12 (V-R VITAMIN B-12) 500 MCG tablet Take 1 tablet (500 mcg  total) by mouth daily. 08/11/19   [name], MD    Allergies Doxepin and Lincomycin hcl  Family History  Problem Relation Age of Onset   Congestive Heart Failure Mother    Stroke Father 75   Congestive Heart Failure Father    Cataracts Sister    Leukemia Brother     Social History Social History   Tobacco Use   Smoking status: Former    Types: Cigarettes    Quit date: 11/11/1960    Years since quitting: 60.6   Smokeless tobacco: Never  Vaping Use   Vaping Use: Never used  Substance Use Topics   Alcohol use: No   Drug use: No    Review of Systems  Unable to be accurately assessed due to patient's disorientation and dementia ____________________________________________   PHYSICAL EXAM:  VITAL SIGNS: Vitals:   06/23/21 1330 06/23/21 1339  BP: (!)  109/58 (!) 106/57  Pulse: 69 66  Resp:  18  Temp:  98.3 F (36.8 C)  SpO2: 99% 98%    Constitutional: Alert and pleasantly disoriented. In no acute distress.  Offers no complaints. Eyes: Conjunctivae are normal. PERRL. EOMI. Head: Atraumatic. Nose: No congestion/rhinnorhea. Mouth/Throat: Mucous membranes are dry.  Oropharynx non-erythematous. Neck: No stridor. No cervical spine tenderness to palpation. Cardiovascular: Normal rate, regular rhythm. Grossly normal heart sounds.  Good peripheral circulation. Respiratory: Normal respiratory effort.  No retractions. Lungs CTAB. Gastrointestinal: Soft , nondistended. No CVA tenderness. Diffuse tenderness without peritoneal or localizing features. Musculoskeletal: No lower extremity tenderness nor edema.  No joint effusions. No signs of acute trauma. Neurologic:  Normal speech and language. No gross focal neurologic deficits are appreciated. No gait instability noted. Skin:  Skin is warm, dry and intact. No rash noted. Psychiatric: Mood and affect are normal. Speech and behavior are normal. ____________________________________________   LABS (all labs ordered are listed, but only abnormal results are displayed)  Labs Reviewed  BASIC METABOLIC PANEL - Abnormal; Notable for the following components:      Result Value   Glucose, Bld 165 (*)    Creatinine, Ser 1.54 (*)    GFR, Estimated 44 (*)    All other components within normal limits  CBC - Abnormal; Notable for the following components:   WBC 17.1 (*)    Platelets 123 (*)    All other components within normal limits  LACTIC ACID, PLASMA - Abnormal; Notable for the following components:   Lactic Acid, Venous 2.0 (*)    All other components within normal limits  RESP PANEL BY RT-PCR (FLU A&B, COVID) ARPGX2  C DIFFICILE QUICK SCREEN W PCR REFLEX    GASTROINTESTINAL PANEL BY PCR, STOOL (REPLACES STOOL CULTURE)  CULTURE, BLOOD (ROUTINE X 2)  CULTURE, BLOOD (ROUTINE X 2)  URINALYSIS,  COMPLETE (UACMP) WITH MICROSCOPIC  LACTIC ACID, PLASMA  TROPONIN I (HIGH SENSITIVITY)  TROPONIN I (HIGH SENSITIVITY)   ____________________________________________  12 Lead EKG  Sinus rhythm with a rate of 77 bpm.  Leftward axis.  Right bundle.  No STEMI. ____________________________________________  RADIOLOGY  ED MD interpretation:    Official radiology report(s): CT ABDOMEN PELVIS WO CONTRAST  Result Date: 06/23/2021 CLINICAL DATA:  Syncope, hypotension, diarrhea. EXAM: CT ABDOMEN AND PELVIS WITHOUT CONTRAST TECHNIQUE: Multidetector CT imaging of the abdomen and pelvis was performed following the standard protocol without IV contrast. COMPARISON:  CT abdomen pelvis dated 05/06/2021. FINDINGS: Lower chest: No acute abnormality. Hepatobiliary: No focal liver abnormality is seen. No gallstones, gallbladder wall thickening, or biliary dilatation.  Pancreas: Unremarkable. No pancreatic ductal dilatation or surrounding inflammatory changes. Spleen: Normal in size without focal abnormality. Adrenals/Urinary Tract: Adrenal glands are unremarkable. Kidneys are normal, without renal calculi, focal lesion, or hydronephrosis. Bladder is incompletely distended, however the urinary bladder wall appears thickened, measuring up to 6 mm. Stomach/Bowel: Stomach is within normal limits. There is circumferential bowel wall thickening of the rectum and sigmoid colon without significant surrounding fat stranding. The appendix appears normal. No evidence of bowel obstruction. Vascular/Lymphatic: Aortic atherosclerosis. No enlarged abdominal or pelvic lymph nodes. Reproductive: Prostate is unremarkable. Other: No abdominal wall hernia or abnormality. No abdominopelvic ascites. Musculoskeletal: Degenerative changes are seen in the spine. IMPRESSION: 1. Circumferential bowel wall thickening of the rectum and sigmoid is nonspecific and may represent an infectious, inflammatory, or ischemic process. 2. The urinary bladder  is incompletely distended, however the bladder wall appears thickened and may reflect cystitis. Electronically Signed   By: Zerita Boers M.D.   On: 06/23/2021 12:17   CT HEAD WO CONTRAST  Result Date: 06/23/2021 CLINICAL DATA:  Syncope, dementia. EXAM: CT HEAD WITHOUT CONTRAST TECHNIQUE: Contiguous axial images were obtained from the base of the skull through the vertex without intravenous contrast. COMPARISON:  CT head dated 05/04/2021. FINDINGS: Brain: No evidence of acute infarction, hemorrhage, hydrocephalus, extra-axial collection or mass lesion/mass effect. There is mild cerebral volume loss with associated ex vacuo dilatation. Periventricular white matter hypoattenuation likely represents chronic small vessel ischemic disease. Vascular: There are vascular calcifications in the carotid siphons. Skull: Normal. Negative for fracture or focal lesion. Sinuses/Orbits: A lacrimal bypass tube (Jones tube) is noted on the right. Other: None. IMPRESSION: No acute intracranial process. Electronically Signed   By: Zerita Boers M.D.   On: 06/23/2021 12:05    ____________________________________________   PROCEDURES and INTERVENTIONS  Procedure(s) performed (including Critical Care):  .1-3 Lead EKG Interpretation  Date/Time: 06/23/2021 2:15 PM Performed by: Vladimir Crofts, MD Authorized by: Vladimir Crofts, MD     Interpretation: normal     ECG rate:  70   ECG rate assessment: normal     Rhythm: sinus rhythm     Ectopy: none     Conduction: normal    Medications  midodrine (PROAMATINE) tablet 2.5 mg (2.5 mg Oral Given 06/23/21 1208)  metroNIDAZOLE (FLAGYL) IVPB 500 mg (has no administration in time range)  sodium chloride 0.9 % bolus 500 mL (0 mLs Intravenous Stopped 06/23/21 1140)  lactated ringers bolus 1,000 mL (0 mLs Intravenous Stopped 06/23/21 1343)    ____________________________________________   MDM / ED COURSE   87 year old male who just recently finished a course of oral vancomycin  for C. difficile, presents to the ED after syncopal episode associated with recurrence of diarrhea, requiring medical admission.  Presents with soft pressures prior to his daily midodrine, after this he remains stable with normal vital signs throughout his stay in the ED.  Exam is somewhat difficult due to his disorientation and dementia, at baseline.  His wife at the bedside provides all history and indicates that he was doing well this past week until overnight this past night when he had sudden recurrence of profuse and watery diarrhea with a foul smell reminiscent of C. difficile in the past.  He has a leukocytosis and mild lactic acidosis.  CT imaging without evidence of obstruction, and does have stigmata of colitis circumferentially to his distal colon.  No evidence of intracranial pathology on CT imaging.  Awaiting stool studies at this point, but considering his syncope and lab  values, I believe he would benefit from medical admission regardless.  Provided IV Flagyl to help cover intra-abdominal pathology and C. difficile due to the continued degree of uncertainty of the etiology of his colitis and not wanting to worsen his possible C. difficile.  Clinical Course as of 06/23/21 1415  Sat Jun 23, 2021  1401 Reassessed.  No stool sample yet.  We discussed CT findings and admission.  Wife is agreeable.  Patient continues to have no complaints. [DS]    Clinical Course User Index [DS] Vladimir Crofts, MD    ____________________________________________   FINAL CLINICAL IMPRESSION(S) / ED DIAGNOSES  Final diagnoses:  Colitis  Syncope, unspecified syncope type     ED Discharge Orders     None        Kalvyn Desa   Note:  This document was prepared using Dragon voice recognition software and may include unintentional dictation errors.    Vladimir Crofts, MD 06/23/21 [phone]

## 2021-06-23 NOTE — Assessment & Plan Note (Signed)
Stable. Continue IVF

## 2021-06-23 NOTE — H&P (Signed)
History and Physical    Dylan [name] [id number] DOB: June 12, 1934 DOA: 06/23/2021  PCP: [name], MD   Patient coming from: Home  I have personally briefly reviewed patient's old medical records in [place]  CC: profuse diarrhea, pre-syncope HPI: 87 year old white male with a history of dementia, CKD stage III, depression presents to the ER today with recurrent diarrhea.  Patient was in the hospital 2 weeks ago.  He was diagnosed with C. difficile colitis.  Patient completed 10 days of oral vancomycin at home.  Wife states that his last dose was last Saturday.  Patient has been doing better over the last 7 days.  Today, the wife found the patient in the bedroom with the bed covered in liquid stool.  Patient also had defecated on the floor.  She got the patient up to the bathroom where he had another diarrheal bowel movement in the toilet.  She got him into the shower.  While she was cleaning them off, the patient had a presyncopal episode.  She states the patient became very pale white.  He did not lose consciousness however.  After several minutes, the patient came to.  She brought him to the ER.  In the ER, CT scan demonstrated colitis.  White count was elevated 17.  Patient not had any more diarrhea since then.  The wife states that the diarrhea this morning smells exactly like the diarrhea he had when he had C. difficile colitis.  His last bout of C. difficile colitis was his first time he had C. difficile.  Due to concern about possible recurrent C. difficile colitis, and presyncope, [facility] hospitalist contacted for admission.  Patient with a history of dementia and bilateral hearing loss.  Review of systems and history cannot be obtained from the patient.  History obtained from the wife.   ED Course: pt had CT abd that showed colitis. WBC elevated at 17. Lactic acid of 2.0. pt given IVF and midodrine.  Review of Systems:  Review of Systems  Unable to perform ROS:  Dementia   Past Medical History:  Diagnosis Date   Basal cell carcinoma 08/31/2020   Mid forehead - ED&C    Basal cell carcinoma of right nasal sidewall 12/15/2014   Bilateral hearing loss 08/03/2018   S/p audiology evaluation, has new hearing aides.   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/2014   Mohs defect of eyelid 12/14/2014   Nuclear sclerosis, left 04/01/2017   Obstruction of right lacrimal duct 10/16/2015   PAD (peripheral artery disease) ([place]) 03/21/2020   Abnormal ABI, abnormal arterial duplex 03/2020:  R - moderate RLE disease, abnormal TBI L - WNL, abnormal TBI Referred for vascular consult - rec medical management   Pruritus 02/19/2020   Recurrent syncope 06/22/2018   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   Right leg swelling 02/19/2020   Sepsis ([place]) 05/30/2020   Thrombocytopenia ([place]) 02/21/2011  periph smear stable 02/2020   Vitamin B12 deficiency 08/03/2018   Completed b12 shots 09/2018, maintaining levels on oral replacement (dissolvable tablets) 01/2019   Vitamin D deficiency 05/19/2018    Past Surgical History:  Procedure Laterality Date   BASAL CELL CARCINOMA EXCISION Right 01/2015   with reconstructive surgery around eye   CATARACT EXTRACTION Left 03/2017   ETT  09/2018   negative for ischemia, extremely poor exercise tolerance ([place])   FOOT SURGERY Left 1998   metatarsal  amputation after injury   KNEE ARTHROSCOPY  1993   Right   KNEE ARTHROSCOPY  01/2001   Left   LOOP RECORDER INSERTION N/A 09/30/2018   Procedure: LOOP RECORDER INSERTION;  Surgeon: Constance Haw, MD;  Location: [facility];  Service: Cardiovascular;  Laterality: N/A;     reports that he quit smoking about 60 years ago. His smoking use included cigarettes. He has never used smokeless tobacco. He reports that he does not drink alcohol and does not use drugs.  Allergies  Allergen Reactions   Doxepin Other (See Comments)    Lethargy, overly sedating at low dose   Lincomycin Hcl Rash    Family History  Problem Relation Age of Onset   Congestive Heart Failure Mother    Stroke Father 20   Congestive Heart Failure Father    Cataracts Sister    Leukemia Brother     Prior to Admission medications   Medication Sig Start Date End Date Taking? Authorizing Provider  Cholecalciferol (VITAMIN D3) 1000 units CAPS Take 1 capsule (1,000 Units total) by mouth daily. 08/03/18  Yes [name], MD  finasteride (PROSCAR) 5 MG tablet Take 1 tablet (5 mg total) by mouth daily. 05/15/21  Yes [name], MD  memantine (NAMENDA XR) 14 MG CP24 24 hr capsule Take 1 capsule (14 mg total) by mouth daily. 08/16/20  Yes [name], MD  midodrine (PROAMATINE) 2.5 MG tablet Take 1 tablet (2.5 mg total) by mouth 3 (three) times daily with meals. 08/16/20  Yes [name], MD  sertraline (ZOLOFT) 50 MG tablet Take 1 tablet (50 mg total) by mouth daily. 02/14/21  Yes [name], MD  vitamin B-12 (V-R VITAMIN B-12) 500 MCG tablet Take 1 tablet (500 mcg total) by mouth daily. 08/11/19  Yes [name], MD  feeding supplement (ENSURE ENLIVE / ENSURE PLUS) LIQD Take 237 mLs by mouth 2 (two) times daily between meals. 05/06/21   Loletha Grayer, MD  polyethylene glycol (MIRALAX / GLYCOLAX) 17 g packet Take 17 g by mouth daily. Patient not taking: No sig reported 05/06/21    Loletha Grayer, MD  senna (SENOKOT) 8.6 MG TABS tablet Take 1 tablet (8.6 mg total) by mouth 2 (two) times daily. Patient not taking: No sig reported 05/06/21   Loletha Grayer, MD    Physical Exam: Vitals:   06/23/21 1330 06/23/21 1339 06/23/21 1400 06/23/21 1430  BP: (!) 109/58 (!) 106/57 107/72 (!) 109/58  Pulse: 69 66 67 64  Resp:  '18 20 19  '$ Temp:  98.3 F (36.8 C)    TempSrc:  Oral    SpO2: 99% 98% 99% 96%  Weight:      Height:        Physical Exam Vitals and nursing note reviewed.  Constitutional:      General: He is not in acute distress.    Appearance: Normal appearance. He is not ill-appearing, toxic-appearing or diaphoretic.  HENT:     Head: Normocephalic and atraumatic.  Nose: Nose normal.  Cardiovascular:     Rate and Rhythm: Normal rate and regular rhythm.     Pulses: Normal pulses.  Pulmonary:     Effort: Pulmonary effort is normal. No respiratory distress.     Breath sounds: Normal breath sounds. No wheezing or rales.  Abdominal:     General: Abdomen is flat. Bowel sounds are normal. There is no distension.     Palpations: Abdomen is soft.     Tenderness: There is no abdominal tenderness. There is no guarding.  Musculoskeletal:     Right lower leg: No edema.     Left lower leg: No edema.  Skin:    General: Skin is warm and dry.     Capillary Refill: Capillary refill takes less than 2 seconds.  Neurological:     Mental Status: He is alert.     Comments: Pt hard of hearing. Does not have hearing aids.     Labs on Admission: I have personally reviewed following labs and imaging studies  CBC: Recent Labs  Lab 06/23/21 1023  WBC 17.1*  HGB 16.1  HCT 46.9  MCV 95.3  PLT [id number]   Basic Metabolic Panel: Recent Labs  Lab 06/23/21 1023  NA 140  K 4.6  CL 101  CO2 26  GLUCOSE 165*  BUN 16  CREATININE 1.54*  CALCIUM 9.3   GFR: Estimated Creatinine Clearance: 39.6 mL/min (A) (by C-G formula based on SCr of 1.54 mg/dL (H)). Liver Function  Tests: No results for input(s): AST, ALT, ALKPHOS, BILITOT, PROT, ALBUMIN in the last 168 hours. No results for input(s): LIPASE, AMYLASE in the last 168 hours. No results for input(s): AMMONIA in the last 168 hours. Coagulation Profile: No results for input(s): INR, PROTIME in the last 168 hours. Cardiac Enzymes: No results for input(s): CKTOTAL, CKMB, CKMBINDEX, TROPONINI in the last 168 hours. BNP (last 3 results) No results for input(s): PROBNP in the last 8760 hours. HbA1C: No results for input(s): HGBA1C in the last 72 hours. CBG: No results for input(s): GLUCAP in the last 168 hours. Lipid Profile: No results for input(s): CHOL, HDL, LDLCALC, TRIG, CHOLHDL, LDLDIRECT in the last 72 hours. Thyroid Function Tests: No results for input(s): TSH, T4TOTAL, FREET4, T3FREE, THYROIDAB in the last 72 hours. Anemia Panel: No results for input(s): VITAMINB12, FOLATE, FERRITIN, TIBC, IRON, RETICCTPCT in the last 72 hours. Urine analysis:    Component Value Date/Time   COLORURINE YELLOW (A) 05/04/2021 1331   APPEARANCEUR CLEAR (A) 05/04/2021 1331   LABSPEC 1.010 05/04/2021 1331   PHURINE 5.0 05/04/2021 1331   GLUCOSEU NEGATIVE 05/04/2021 1331   HGBUR NEGATIVE 05/04/2021 1331   BILIRUBINUR 1+ 06/05/2021 1635   KETONESUR NEGATIVE 05/04/2021 1331   PROTEINUR Positive (A) 06/05/2021 1635   PROTEINUR NEGATIVE 05/04/2021 1331   UROBILINOGEN 0.2 06/05/2021 1635   NITRITE negative 06/05/2021 1635   NITRITE NEGATIVE 05/04/2021 1331   LEUKOCYTESUR Negative 06/05/2021 1635   LEUKOCYTESUR NEGATIVE 05/04/2021 1331    Radiological Exams on Admission: I have personally reviewed images CT ABDOMEN PELVIS WO CONTRAST  Result Date: 06/23/2021 CLINICAL DATA:  Syncope, hypotension, diarrhea. EXAM: CT ABDOMEN AND PELVIS WITHOUT CONTRAST TECHNIQUE: Multidetector CT imaging of the abdomen and pelvis was performed following the standard protocol without IV contrast. COMPARISON:  CT abdomen pelvis dated  05/06/2021. FINDINGS: Lower chest: No acute abnormality. Hepatobiliary: No focal liver abnormality is seen. No gallstones, gallbladder wall thickening, or biliary dilatation. Pancreas: Unremarkable. No pancreatic ductal dilatation or surrounding inflammatory changes.  Spleen: Normal in size without focal abnormality. Adrenals/Urinary Tract: Adrenal glands are unremarkable. Kidneys are normal, without renal calculi, focal lesion, or hydronephrosis. Bladder is incompletely distended, however the urinary bladder wall appears thickened, measuring up to 6 mm. Stomach/Bowel: Stomach is within normal limits. There is circumferential bowel wall thickening of the rectum and sigmoid colon without significant surrounding fat stranding. The appendix appears normal. No evidence of bowel obstruction. Vascular/Lymphatic: Aortic atherosclerosis. No enlarged abdominal or pelvic lymph nodes. Reproductive: Prostate is unremarkable. Other: No abdominal wall hernia or abnormality. No abdominopelvic ascites. Musculoskeletal: Degenerative changes are seen in the spine. IMPRESSION: 1. Circumferential bowel wall thickening of the rectum and sigmoid is nonspecific and may represent an infectious, inflammatory, or ischemic process. 2. The urinary bladder is incompletely distended, however the bladder wall appears thickened and may reflect cystitis. Electronically Signed   By: Zerita Boers M.D.   On: 06/23/2021 12:17   CT HEAD WO CONTRAST  Result Date: 06/23/2021 CLINICAL DATA:  Syncope, dementia. EXAM: CT HEAD WITHOUT CONTRAST TECHNIQUE: Contiguous axial images were obtained from the base of the skull through the vertex without intravenous contrast. COMPARISON:  CT head dated 05/04/2021. FINDINGS: Brain: No evidence of acute infarction, hemorrhage, hydrocephalus, extra-axial collection or mass lesion/mass effect. There is mild cerebral volume loss with associated ex vacuo dilatation. Periventricular white matter hypoattenuation likely  represents chronic small vessel ischemic disease. Vascular: There are vascular calcifications in the carotid siphons. Skull: Normal. Negative for fracture or focal lesion. Sinuses/Orbits: A lacrimal bypass tube (Jones tube) is noted on the right. Other: None. IMPRESSION: No acute intracranial process. Electronically Signed   By: Zerita Boers M.D.   On: 06/23/2021 12:05    EKG: I have personally reviewed EKG: NSR   Assessment/Plan Principal Problem:   Acute colitis - suspected c. diff colitis present on admission Active Problems:   Sepsis with acute organ dysfunction (HCC)   Leukocytosis   CKD (chronic kidney disease) stage 3, GFR 30-59 ml/min (HCC)   Orthostatic hypotension   Spermatocele of epididymis, single   Dementia with behavioral disturbance (HCC)   Bilateral hearing loss    Acute colitis - suspected c. diff colitis present on admission Admit to medical bed. Inpatient. Pt will require more than 2 midnights for evaluation of his colits(probably c. Diff colitis). Will continue IV flagyl for now. C. Diff pending. Pt may need Dificid vs 4 week vancomycin taper. Pt recently completed 10 days of po vanco therapy only for his diarrhea to return 7 days later. Repeat CBC in AM. Continue IVF for next 24 hours.  Leukocytosis Likely due to colitis. Repeat CBC in AM.  CKD (chronic kidney disease) stage 3, GFR 30-59 ml/min (HCC) Stable. Continue IVF  Sepsis with acute organ dysfunction [facility]) Present on admission. Evidenced by elevated lactic acid of 2.0 and hypotension/low BP of 85/61. Will repeat lactic acid.  Orthostatic hypotension Hx of orthostatic hypotension. Continue midodrine.  Dementia with behavioral disturbance (HCC) Chronic.  Bilateral hearing loss Chronic. Wife states pt has hearing aids but they don't work well in the hospital.  Spermatocele of epididymis, single Wife states pt has some left testicular pain last week that she was giving him tylenol. It stopped this  week, only for pain to return today. Continue prn tylenol.  DVT prophylaxis: SCDs Code Status: Full Code Family Communication: discussed with pt's wife meleady at bedside  Disposition Plan: return to home  Consults called: none  Admission status: Inpatient, Med-Surg   Kristopher Oppenheim, DO [facility]  06/23/2021, 2:50 PM

## 2021-06-23 NOTE — Assessment & Plan Note (Signed)
Present on admission. Evidenced by elevated lactic acid of 2.0 and hypotension/low BP of 85/61. Will repeat lactic acid.

## 2021-06-23 NOTE — Assessment & Plan Note (Signed)
Hx of orthostatic hypotension. Continue midodrine.

## 2021-06-23 NOTE — Assessment & Plan Note (Signed)
Wife states pt has some left testicular pain last week that she was giving him tylenol. It stopped this week, only for pain to return today. Continue prn tylenol.

## 2021-06-23 NOTE — Subjective & Objective (Signed)
CC: profuse diarrhea, pre-syncope HPI: 87 year old white male with a history of dementia, CKD stage III, depression presents to the ER today with recurrent diarrhea.  Patient was in the hospital 2 weeks ago.  He was diagnosed with C. difficile colitis.  Patient completed 10 days of oral vancomycin at home.  Wife states that his last dose was last Saturday.  Patient has been doing better over the last 7 days.  Today, the wife found the patient in the bedroom with the bed covered in liquid stool.  Patient also had defecated on the floor.  She got the patient up to the bathroom where he had another diarrheal bowel movement in the toilet.  She got him into the shower.  While she was cleaning them off, the patient had a presyncopal episode.  She states the patient became very pale white.  He did not lose consciousness however.  After several minutes, the patient came to.  She brought him to the ER.  In the ER, CT scan demonstrated colitis.  White count was elevated 17.  Patient not had any more diarrhea since then.  The wife states that the diarrhea this morning smells exactly like the diarrhea he had when he had C. difficile colitis.  His last bout of C. difficile colitis was his first time he had C. difficile.  Due to concern about possible recurrent C. difficile colitis, and presyncope, [facility] hospitalist contacted for admission.  Patient with a history of dementia and bilateral hearing loss.  Review of systems and history cannot be obtained from the patient.  History obtained from the wife.

## 2021-06-23 NOTE — Assessment & Plan Note (Signed)
Admit to medical bed. Inpatient. Pt will require more than 2 midnights for evaluation of his colits(probably c. Diff colitis). Will continue IV flagyl for now. C. Diff pending. Pt may need Dificid vs 4 week vancomycin taper. Pt recently completed 10 days of po vanco therapy only for his diarrhea to return 7 days later. Repeat CBC in AM. Continue IVF for next 24 hours.

## 2021-06-23 NOTE — Assessment & Plan Note (Signed)
Chronic. 

## 2021-06-23 NOTE — Assessment & Plan Note (Signed)
Chronic. Wife states pt has hearing aids but they don't work well in the hospital.

## 2021-06-24 DIAGNOSIS — K529 Noninfective gastroenteritis and colitis, unspecified: Secondary | ICD-10-CM | POA: Diagnosis not present

## 2021-06-24 LAB — CBC WITH DIFFERENTIAL/PLATELET
Abs Immature Granulocytes: 0.03 10*3/uL (ref 0.00–0.07)
Basophils Absolute: 0.1 10*3/uL (ref 0.0–0.1)
Basophils Relative: 1 %
Eosinophils Absolute: 0.3 10*3/uL (ref 0.0–0.5)
Eosinophils Relative: 3 %
HCT: 36.4 % — ABNORMAL LOW (ref 39.0–52.0)
Hemoglobin: 13 g/dL (ref 13.0–17.0)
Immature Granulocytes: 0 %
Lymphocytes Relative: 14 %
Lymphs Abs: 1.1 10*3/uL (ref 0.7–4.0)
MCH: 32.9 pg (ref 26.0–34.0)
MCHC: 35.7 g/dL (ref 30.0–36.0)
MCV: 92.2 fL (ref 80.0–100.0)
Monocytes Absolute: 1.1 10*3/uL — ABNORMAL HIGH (ref 0.1–1.0)
Monocytes Relative: 13 %
Neutro Abs: 5.6 10*3/uL (ref 1.7–7.7)
Neutrophils Relative %: 69 %
Platelets: 93 10*3/uL — ABNORMAL LOW (ref 150–400)
RBC: 3.95 MIL/uL — ABNORMAL LOW (ref 4.22–5.81)
RDW: 12.9 % (ref 11.5–15.5)
WBC: 8.1 10*3/uL (ref 4.0–10.5)
nRBC: 0 % (ref 0.0–0.2)

## 2021-06-24 LAB — COMPREHENSIVE METABOLIC PANEL
ALT: 18 U/L (ref 0–44)
AST: 17 U/L (ref 15–41)
Albumin: 3 g/dL — ABNORMAL LOW (ref 3.5–5.0)
Alkaline Phosphatase: 51 U/L (ref 38–126)
Anion gap: 3 — ABNORMAL LOW (ref 5–15)
BUN: 13 mg/dL (ref 8–23)
CO2: 26 mmol/L (ref 22–32)
Calcium: 8 mg/dL — ABNORMAL LOW (ref 8.9–10.3)
Chloride: 108 mmol/L (ref 98–111)
Creatinine, Ser: 1.22 mg/dL (ref 0.61–1.24)
GFR, Estimated: 58 mL/min — ABNORMAL LOW (ref >60)
Glucose, Bld: 93 mg/dL (ref 70–99)
Potassium: 3.8 mmol/L (ref 3.5–5.1)
Sodium: 137 mmol/L (ref 135–145)
Total Bilirubin: 1.2 mg/dL (ref 0.3–1.2)
Total Protein: 5.4 g/dL — ABNORMAL LOW (ref 6.5–8.1)

## 2021-06-24 LAB — GASTROINTESTINAL PANEL BY PCR, STOOL (REPLACES STOOL CULTURE)

## 2021-06-24 LAB — C DIFFICILE QUICK SCREEN W PCR REFLEX
C Diff antigen: POSITIVE — AB
C Diff interpretation: DETECTED
C Diff toxin: POSITIVE — AB

## 2021-06-24 LAB — PROCALCITONIN: Procalcitonin: <0.1 ng/mL

## 2021-06-24 MED ORDER — FIDAXOMICIN 200 MG PO TABS
200.0000 mg | ORAL_TABLET | Freq: Two times a day (BID) | ORAL | Status: DC
Start: 2021-06-24 — End: 2021-06-28
  Administered 2021-06-24 – 2021-06-28 (×9): 200 mg via ORAL
  Filled 2021-06-24 (×10): qty 1

## 2021-06-24 NOTE — Progress Notes (Signed)
Lab confirmed stool C Diff positive.

## 2021-06-24 NOTE — Progress Notes (Signed)
PROGRESS NOTE    Dylan [name]  [id number] DOB: Mar 06, 1934 DOA: 06/23/2021 PCP: [name], MD    Brief Narrative:  87 year old white male with a history of dementia, CKD stage III, depression presents to the ER today with recurrent diarrhea.  Patient was in the hospital 2 weeks ago.  He was diagnosed with C. difficile colitis.  Patient completed 10 days of oral vancomycin at home.  Wife states that his last dose was last Saturday.  Patient has been doing better over the last 7 days.  Today, the wife found the patient in the bedroom with the bed covered in liquid stool.  Patient also had defecated on the floor.  She got the patient up to the bathroom where he had another diarrheal bowel movement in the toilet.  She got him into the shower.  While she was cleaning them off, the patient had a presyncopal episode.  She states the patient became very pale white.  He did not lose consciousness however.  After several minutes, the patient came to.  She brought him to the ER.  CT abdomen pelvis significant for colitis.  White count on admission elevated to 17.  No diarrhea and/or since admission.  Per the wife the diarrhea smelled exactly like previous episode of C. difficile.  C. difficile positive here.   Assessment & Plan:   Principal Problem:   Acute colitis - suspected c. diff colitis present on admission Active Problems:   Spermatocele of epididymis, single   Dementia with behavioral disturbance (HCC)   Bilateral hearing loss   CKD (chronic kidney disease) stage 3, GFR 30-59 ml/min (HCC)   Sepsis with acute organ dysfunction (HCC)   Orthostatic hypotension   Leukocytosis  Acute C. difficile colitis, first recurrence Sepsis secondary to above Plan: Dificid 200 mg p.o. twice daily IV Flagyl 500 3 times daily Monitor for BM Continue IVF for now Monitor labs Monitor vitals and fever curve  Chronic kidney disease stage IIIa Stable, continue IVF for now  History of  orthostatic hypotension PTA midodrine  Dementia with behavioral disturbance Stable, monitor  Bilateral hearing loss Stable, monitor  Spermatocele of epididymis As needed Tylenol   DVT prophylaxis: SCD Code Status: Full Family Communication: Wife [name] [phone] on 8/14 Disposition Plan: Status is: Inpatient  Remains inpatient appropriate because:Inpatient level of care appropriate due to severity of illness  Dispo: The patient is from: Home              Anticipated d/c is to: Home              Patient currently is not medically stable to d/c.   Difficult to place patient No       Level of care: Med-Surg  Consultants:  None  Procedures:  None  Antimicrobials:  Dificid Metronidazole   Subjective: Patient seen and examined.  Endorses not feeling well but denies pain.  Objective: Vitals:   06/23/21 1631 06/23/21 1953 06/24/21 0326 06/24/21 0755  BP: (!) 112/42 (!) 101/50 102/62 (!) 107/57  Pulse: 68 65 64 (!) 57  Resp: '18 19 20 15  '$ Temp: 98.6 F (37 C) 99 F (37.2 C) 98.6 F (37 C) (!) 97.4 F (36.3 C)  TempSrc:      SpO2: 97% 97% 96% 95%  Weight:      Height:        Intake/Output Summary (Last 24 hours) at 06/24/2021 1029 Last data filed at 06/23/2021 2111 Gross per 24 hour  Intake 1595.42 ml  Output 200 ml  Net 1395.42 ml   Filed Weights   06/23/21 1020  Weight: 82 kg    Examination:  General exam: No acute distress.  Appears frail Respiratory system: Clear to auscultation. Respiratory effort normal. Cardiovascular system: S1 & S2 heard, RRR. No JVD, murmurs, rubs, gallops or clicks. No pedal edema. Gastrointestinal system: Thin, nondistended, mild TTP, hyperactive bowel sounds Central nervous system: Alert, oriented x2, no focal deficits Extremities: Symmetric 5 x 5 power. Skin: No rashes, lesions or ulcers Psychiatry: Judgement and insight appear normal. Mood & affect appropriate.     Data Reviewed: I have personally reviewed  following labs and imaging studies  CBC: Recent Labs  Lab 06/23/21 1023 06/24/21 0439  WBC 17.1* 8.1  NEUTROABS  --  5.6  HGB 16.1 13.0  HCT 46.9 36.4*  MCV 95.3 92.2  PLT 123* 93*   Basic Metabolic Panel: Recent Labs  Lab 06/23/21 1023 06/24/21 0439  NA 140 137  K 4.6 3.8  CL 101 108  CO2 26 26  GLUCOSE 165* 93  BUN 16 13  CREATININE 1.54* 1.22  CALCIUM 9.3 8.0*   GFR: Estimated Creatinine Clearance: 50 mL/min (by C-G formula based on SCr of 1.22 mg/dL). Liver Function Tests: Recent Labs  Lab 06/24/21 0439  AST 17  ALT 18  ALKPHOS 51  BILITOT 1.2  PROT 5.4*  ALBUMIN 3.0*   No results for input(s): LIPASE, AMYLASE in the last 168 hours. No results for input(s): AMMONIA in the last 168 hours. Coagulation Profile: No results for input(s): INR, PROTIME in the last 168 hours. Cardiac Enzymes: No results for input(s): CKTOTAL, CKMB, CKMBINDEX, TROPONINI in the last 168 hours. BNP (last 3 results) No results for input(s): PROBNP in the last 8760 hours. HbA1C: No results for input(s): HGBA1C in the last 72 hours. CBG: No results for input(s): GLUCAP in the last 168 hours. Lipid Profile: No results for input(s): CHOL, HDL, LDLCALC, TRIG, CHOLHDL, LDLDIRECT in the last 72 hours. Thyroid Function Tests: No results for input(s): TSH, T4TOTAL, FREET4, T3FREE, THYROIDAB in the last 72 hours. Anemia Panel: No results for input(s): VITAMINB12, FOLATE, FERRITIN, TIBC, IRON, RETICCTPCT in the last 72 hours. Sepsis Labs: Recent Labs  Lab 06/23/21 1150 06/23/21 1344 06/24/21 0439  PROCALCITON  --   --  <0.10  LATICACIDVEN 2.0* 1.5  --     Recent Results (from the past 240 hour(s))  Blood culture (routine x 2)     Status: None (Preliminary result)   Collection Time: 06/23/21 11:50 AM   Specimen: BLOOD  Result Value Ref Range Status   Specimen Description BLOOD BLOOD RIGHT WRIST  Final   Special Requests   Final    BOTTLES DRAWN AEROBIC AND ANAEROBIC Blood  Culture results may not be optimal due to an inadequate volume of blood received in culture bottles   Culture   Final    NO GROWTH < 24 HOURS Performed at [facility], [address] [place], [place] [address]    Report Status PENDING  Incomplete  Blood culture (routine x 2)     Status: None (Preliminary result)   Collection Time: 06/23/21 12:00 PM   Specimen: BLOOD  Result Value Ref Range Status   Specimen Description BLOOD LEFT ANTECUBITAL  Final   Special Requests   Final    BOTTLES DRAWN AEROBIC AND ANAEROBIC Blood Culture adequate volume   Culture   Final    NO GROWTH < 24 HOURS Performed at [facility], [place], [place] [address]    Report Status PENDING  Incomplete  Resp Panel by RT-PCR (Flu A&B, Covid) Nasopharyngeal Swab     Status: None   Collection Time: 06/23/21 12:00 PM   Specimen: Nasopharyngeal Swab; Nasopharyngeal(NP) swabs in vial transport medium  Result Value Ref Range Status   SARS Coronavirus 2 by RT PCR NEGATIVE NEGATIVE Final    Comment: (NOTE) SARS-CoV-2 target nucleic acids are NOT DETECTED.  The SARS-CoV-2 RNA is generally detectable in upper respiratory specimens during the acute phase of infection. The lowest concentration of SARS-CoV-2 viral copies this assay can detect is 138 copies/mL. A negative result does not preclude SARS-Cov-2 infection and should not be used as the sole basis for treatment or other patient management decisions. A negative result may occur with  improper specimen collection/handling, submission of specimen other than nasopharyngeal swab, presence of viral mutation(s) within the areas targeted by this assay, and inadequate number of viral copies(<138 copies/mL). A negative result must be combined with clinical observations, patient history, and epidemiological information. The expected result is Negative.  Fact Sheet for Patients:  EntrepreneurPulse.com.au  Fact Sheet  for Healthcare Providers:  IncredibleEmployment.be  This test is no t yet approved or cleared by the [place] FDA and  has been authorized for detection and/or diagnosis of SARS-CoV-2 by FDA under an Emergency Use Authorization (EUA). This EUA will remain  in effect (meaning this test can be used) for the duration of the COVID-19 declaration under Section 564(b)(1) of the Act, 21 U.S.C.section 360bbb-3(b)(1), unless the authorization is terminated  or revoked sooner.       Influenza A by PCR NEGATIVE NEGATIVE Final   Influenza B by PCR NEGATIVE NEGATIVE Final    Comment: (NOTE) The Xpert Xpress SARS-CoV-2/FLU/RSV plus assay is intended as an aid in the diagnosis of influenza from Nasopharyngeal swab specimens and should not be used as a sole basis for treatment. Nasal washings and aspirates are unacceptable for Xpert Xpress SARS-CoV-2/FLU/RSV testing.  Fact Sheet for Patients: EntrepreneurPulse.com.au  Fact Sheet for Healthcare Providers: IncredibleEmployment.be  This test is not yet approved or cleared by the [place] FDA and has been authorized for detection and/or diagnosis of SARS-CoV-2 by FDA under an Emergency Use Authorization (EUA). This EUA will remain in effect (meaning this test can be used) for the duration of the COVID-19 declaration under Section 564(b)(1) of the Act, 21 U.S.C. section 360bbb-3(b)(1), unless the authorization is terminated or revoked.  Performed at [facility], [place], [place] [address]   C Difficile Quick Screen w PCR reflex     Status: Abnormal   Collection Time: 06/23/21 11:30 PM   Specimen: STOOL  Result Value Ref Range Status   C Diff antigen POSITIVE (A) NEGATIVE Final   C Diff toxin POSITIVE (A) NEGATIVE Final   C Diff interpretation Toxin producing C. difficile detected.  Final    Comment: CRITICAL RESULT CALLED TO, READ BACK BY AND VERIFIED  WITH: DENNIS [facility] AT 0211 ON 06/24/21 BY SS Performed at [facility], [address] [place], [place] [address]   Gastrointestinal Panel by PCR , Stool     Status: None   Collection Time: 06/23/21 11:30 PM   Specimen: Stool  Result Value Ref Range Status   Campylobacter species NOT DETECTED NOT DETECTED Final   Plesimonas shigelloides NOT DETECTED NOT DETECTED Final   Salmonella species NOT DETECTED NOT DETECTED Final   Yersinia enterocolitica NOT DETECTED  NOT DETECTED Final   Vibrio species NOT DETECTED NOT DETECTED Final   Vibrio cholerae NOT DETECTED NOT DETECTED Final   Enteroaggregative E coli (EAEC) NOT DETECTED NOT DETECTED Final   Enteropathogenic E coli (EPEC) NOT DETECTED NOT DETECTED Final   Enterotoxigenic E coli (ETEC) NOT DETECTED NOT DETECTED Final   Shiga like toxin producing E coli (STEC) NOT DETECTED NOT DETECTED Final   Shigella/Enteroinvasive E coli (EIEC) NOT DETECTED NOT DETECTED Final   Cryptosporidium NOT DETECTED NOT DETECTED Final   Cyclospora cayetanensis NOT DETECTED NOT DETECTED Final   Entamoeba histolytica NOT DETECTED NOT DETECTED Final   Giardia lamblia NOT DETECTED NOT DETECTED Final   Adenovirus F40/41 NOT DETECTED NOT DETECTED Final   Astrovirus NOT DETECTED NOT DETECTED Final   Norovirus GI/GII NOT DETECTED NOT DETECTED Final   Rotavirus A NOT DETECTED NOT DETECTED Final   Sapovirus (I, II, IV, and V) NOT DETECTED NOT DETECTED Final    Comment: Performed at [facility], [address] [place], [place] [address]         Radiology Studies: CT ABDOMEN PELVIS WO CONTRAST  Result Date: 06/23/2021 CLINICAL DATA:  Syncope, hypotension, diarrhea. EXAM: CT ABDOMEN AND PELVIS WITHOUT CONTRAST TECHNIQUE: Multidetector CT imaging of the abdomen and pelvis was performed following the standard protocol without IV contrast. COMPARISON:  CT abdomen pelvis dated 05/06/2021. FINDINGS: Lower chest: No acute abnormality. Hepatobiliary: No  focal liver abnormality is seen. No gallstones, gallbladder wall thickening, or biliary dilatation. Pancreas: Unremarkable. No pancreatic ductal dilatation or surrounding inflammatory changes. Spleen: Normal in size without focal abnormality. Adrenals/Urinary Tract: Adrenal glands are unremarkable. Kidneys are normal, without renal calculi, focal lesion, or hydronephrosis. Bladder is incompletely distended, however the urinary bladder wall appears thickened, measuring up to 6 mm. Stomach/Bowel: Stomach is within normal limits. There is circumferential bowel wall thickening of the rectum and sigmoid colon without significant surrounding fat stranding. The appendix appears normal. No evidence of bowel obstruction. Vascular/Lymphatic: Aortic atherosclerosis. No enlarged abdominal or pelvic lymph nodes. Reproductive: Prostate is unremarkable. Other: No abdominal wall hernia or abnormality. No abdominopelvic ascites. Musculoskeletal: Degenerative changes are seen in the spine. IMPRESSION: 1. Circumferential bowel wall thickening of the rectum and sigmoid is nonspecific and may represent an infectious, inflammatory, or ischemic process. 2. The urinary bladder is incompletely distended, however the bladder wall appears thickened and may reflect cystitis. Electronically Signed   By: Zerita Boers M.D.   On: 06/23/2021 12:17   CT HEAD WO CONTRAST  Result Date: 06/23/2021 CLINICAL DATA:  Syncope, dementia. EXAM: CT HEAD WITHOUT CONTRAST TECHNIQUE: Contiguous axial images were obtained from the base of the skull through the vertex without intravenous contrast. COMPARISON:  CT head dated 05/04/2021. FINDINGS: Brain: No evidence of acute infarction, hemorrhage, hydrocephalus, extra-axial collection or mass lesion/mass effect. There is mild cerebral volume loss with associated ex vacuo dilatation. Periventricular white matter hypoattenuation likely represents chronic small vessel ischemic disease. Vascular: There are vascular  calcifications in the carotid siphons. Skull: Normal. Negative for fracture or focal lesion. Sinuses/Orbits: A lacrimal bypass tube (Jones tube) is noted on the right. Other: None. IMPRESSION: No acute intracranial process. Electronically Signed   By: Zerita Boers M.D.   On: 06/23/2021 12:05        Scheduled Meds:  fidaxomicin  200 mg Oral BID   midodrine  2.5 mg Oral TID WC   Continuous Infusions:  lactated ringers 100 mL/hr at 06/23/21 1635   metronidazole 500 mg (06/24/21 0934)  LOS: 1 day    Time spent: 25 minutes    Sidney Ace, MD [facility] Pager [phone]  If 7PM-7AM, please contact night-coverage 06/24/2021, 10:29 AM

## 2021-06-24 NOTE — Progress Notes (Signed)
OT Cancellation Note  Patient Details Name: Dylan [name] MRN: [id number] DOB: June 08, 1934   Cancelled Treatment:    Reason Eval/Treat Not Completed: OT screened, no needs identified, will sign off. OT order received and chart reviewed. Per PT, pt ambulating and performing self care tasks without assistance within the room. Pt has supervision 24/7 from wife at home secondary to pt having dementia. Pt with no acute OT needs at this time. OT to SIGN OFF.   Darleen Crocker, MS, OTR/L , CBIS ascom [phone]  06/24/21, 2:56 PM

## 2021-06-24 NOTE — Evaluation (Signed)
Physical Therapy Evaluation Patient Details Name: Dylan [name] MRN: [id number] DOB: 1934/04/23 Today's Date: 06/24/2021   History of Present Illness  87 year old white male with a history of dementia, CKD stage III, depression presents to the ER today with recurrent diarrhea.  Patient was in the hospital 2 weeks ago.  He was diagnosed with C. difficile colitis.  Patient completed 10 days of oral vancomycin at home.  Wife states that his last dose was last Saturday. He was diagnosed with C-diff and admitted for inpatient treatment. Patient started on IV antibiotics and given fluid.  Clinical Impression  Patient doing well. He presents to hospital with new onset diarrhea. He was diagnosed with C-diff a few weeks ago and completed his oral antibiotics. However yesterday he started having more diarrhea. He became pre-syncope in the shower while his wife was helping him clean up. He was brought to the emergency room. Patient is hard of hearing and has dementia at baseline. As a result, most history intake was obtained from his wife, Dylan [name]. Patient is independent with gait at baseline but does require direction and supervision from wife due to cognitive decline. He was independent in bed mobility. Patient exhibits good strength in BLE grossly 5/5. He was modified independent in sit to stand transfers requiring 2 hands to push up from chair. He was able to walk in room with HHA to close supervision. Wife reports he is walking slightly slower than baseline likely because he was not wearing shoes but rather non-skid socks. Patient would benefit from skilled PT intervention to improve mobility and safety. Will see patient a minimum of 2x a week in order to facilitate out of bed mobility to reduce risk of weakness/fatigue. Once discharged, no additional skilled PT intervention necessary. Patient and wife agreeable.    Follow Up Recommendations No PT follow up;Supervision/Assistance - 24 hour    Equipment  Recommendations  None recommended by PT    Recommendations for Other Services       Precautions / Restrictions Precautions Precautions: None Restrictions Weight Bearing Restrictions: No      Mobility  Bed Mobility Overal bed mobility: Independent                  Transfers Overall transfer level: Modified independent Equipment used: None             General transfer comment: uses both hands to push up from surface, requires slightly increased time but able to exhibit good mobility/safety;  Ambulation/Gait Ambulation/Gait assistance: Min guard Gait Distance (Feet): 25 Feet Assistive device: None   Gait velocity: decreased   General Gait Details: Pt ambulated in room, initially with HHA but then able to continue walking with close supervision. He ambulates with slightly slower gait speed, reciprocal pattern, normal base of support, erect posture;  Stairs            Wheelchair Mobility    Modified Rankin (Stroke Patients Only)       Balance Overall balance assessment: Modified Independent (able to stand unsupported without loss of balance)                                           Pertinent Vitals/Pain Pain Assessment: 0-10 Pain Score: 8  Pain Location: scrotum Pain Intervention(s): Patient requesting pain meds-RN notified    Home Living Family/patient expects to be discharged to:: Private residence Living Arrangements: Spouse/significant  other Available Help at Discharge: Family;Available 24 hours/day Type of Home: House Home Access: Stairs to enter Entrance Stairs-Rails: Can reach both;Left;Right Entrance Stairs-Number of Steps: 3 Home Layout: One level Home Equipment: Cane - single point      Prior Function Level of Independence: Independent         Comments: Spouse reports pt is independent with mobility without AD, she provides supervision 2/2 dementia/cognitive deficits.     Hand Dominance   Dominant  Hand: Right    Extremity/Trunk Assessment   Upper Extremity Assessment Upper Extremity Assessment: Overall WFL for tasks assessed    Lower Extremity Assessment Lower Extremity Assessment: Overall WFL for tasks assessed    Cervical / Trunk Assessment Cervical / Trunk Assessment: Normal  Communication   Communication: HOH  Cognition Arousal/Alertness: Awake/alert Behavior During Therapy: WFL for tasks assessed/performed Overall Cognitive Status: History of cognitive impairments - at baseline                                 General Comments: has dementia; requires simple commands      General Comments      Exercises     Assessment/Plan    PT Assessment Patient needs continued PT services  PT Problem List Decreased mobility;Decreased activity tolerance       PT Treatment Interventions Therapeutic exercise;Gait training;Balance training;Stair training;Functional mobility training;Therapeutic activities;Patient/family education    PT Goals (Current goals can be found in the Care Plan section)  Acute Rehab PT Goals Patient Stated Goal: to go home PT Goal Formulation: With patient Time For Goal Achievement: 07/08/21 Potential to Achieve Goals: Good    Frequency Min 2X/week   Barriers to discharge   none    Co-evaluation               AM-PAC PT "6 Clicks" Mobility  Outcome Measure Help needed turning from your back to your side while in a flat bed without using bedrails?: None Help needed moving from lying on your back to sitting on the side of a flat bed without using bedrails?: None Help needed moving to and from a bed to a chair (including a wheelchair)?: A Little Help needed standing up from a chair using your arms (e.g., wheelchair or bedside chair)?: None Help needed to walk in hospital room?: A Little Help needed climbing 3-5 steps with a railing? : A Little 6 Click Score: 21    End of Session Equipment Utilized During Treatment: Gait  belt Activity Tolerance: Patient tolerated treatment well Patient left: in chair;with call bell/phone within reach;with chair alarm set;with family/visitor present Nurse Communication: Patient requests pain meds PT Visit Diagnosis: Difficulty in walking, not elsewhere classified (R26.2)    Time: 1415-1440 PT Time Calculation (min) (ACUTE ONLY): 25 min   Charges:   PT Evaluation $PT Eval Low Complexity: 1 Low            Kahdijah Errickson PT, DPT 06/24/2021, 2:53 PM

## 2021-06-25 ENCOUNTER — Other Ambulatory Visit: Payer: Self-pay

## 2021-06-25 ENCOUNTER — Other Ambulatory Visit (HOSPITAL_COMMUNITY): Payer: Self-pay

## 2021-06-25 DIAGNOSIS — K529 Noninfective gastroenteritis and colitis, unspecified: Secondary | ICD-10-CM | POA: Diagnosis not present

## 2021-06-25 MED ORDER — DIFICID 200 MG PO TABS
200.0000 mg | ORAL_TABLET | Freq: Two times a day (BID) | ORAL | 0 refills | Status: AC
  Filled 2021-06-25 (×2): qty 16, 8d supply, fill #0

## 2021-06-25 NOTE — Plan of Care (Signed)
No acute events during the night. IV abx administered. Contact precautions maintained. No BM during this shift. Safety maintained. VSS.  Problem: Education: Goal: Knowledge of General Education information will improve Description: Including pain rating scale, medication(s)/side effects and non-pharmacologic comfort measures Outcome: Progressing   Problem: Health Behavior/Discharge Planning: Goal: Ability to manage health-related needs will improve Outcome: Progressing   Problem: Clinical Measurements: Goal: Ability to maintain clinical measurements within normal limits will improve Outcome: Progressing Goal: Will remain free from infection Outcome: Progressing Goal: Diagnostic test results will improve Outcome: Progressing Goal: Respiratory complications will improve Outcome: Progressing Goal: Cardiovascular complication will be avoided Outcome: Progressing   Problem: Activity: Goal: Risk for activity intolerance will decrease Outcome: Progressing   Problem: Nutrition: Goal: Adequate nutrition will be maintained Outcome: Progressing   Problem: Coping: Goal: Level of anxiety will decrease Outcome: Progressing   Problem: Elimination: Goal: Will not experience complications related to bowel motility Outcome: Progressing Goal: Will not experience complications related to urinary retention Outcome: Progressing   Problem: Pain Managment: Goal: General experience of comfort will improve Outcome: Progressing   Problem: Safety: Goal: Ability to remain free from injury will improve Outcome: Progressing   Problem: Skin Integrity: Goal: Risk for impaired skin integrity will decrease Outcome: Progressing

## 2021-06-25 NOTE — Progress Notes (Signed)
PROGRESS NOTE    Dylan [name]  [id number] DOB: [birth year] DOA: 06/23/2021 PCP: [name], MD    Brief Narrative:  87 year old white male with a history of dementia, CKD stage III, depression presents to the ER today with recurrent diarrhea.  Patient was in the hospital 2 weeks ago.  He was diagnosed with C. difficile colitis.  Patient completed 10 days of oral vancomycin at home.  Wife states that his last dose was last Saturday.  Patient has been doing better over the last 7 days.  Today, the wife found the patient in the bedroom with the bed covered in liquid stool.  Patient also had defecated on the floor.  She got the patient up to the bathroom where he had another diarrheal bowel movement in the toilet.  She got him into the shower.  While she was cleaning them off, the patient had a presyncopal episode.  She states the patient became very pale white.  He did not lose consciousness however.  After several minutes, the patient came to.  She brought him to the ER.  CT abdomen pelvis significant for colitis.  White count on admission elevated to 17.  No diarrhea and/or since admission.  Per the wife the diarrhea smelled exactly like previous episode of C. difficile.  C. difficile positive here.  Per patient's wife he had 1 episode of loose stool on 8/14.   Assessment & Plan:   Principal Problem:   Acute colitis - suspected c. diff colitis present on admission Active Problems:   Spermatocele of epididymis, single   Dementia with behavioral disturbance (HCC)   Bilateral hearing loss   CKD (chronic kidney disease) stage 3, GFR 30-59 ml/min (HCC)   Sepsis with acute organ dysfunction (HCC)   Orthostatic hypotension   Leukocytosis  Acute C. difficile colitis, first recurrence Sepsis secondary to above Sepsis physiology improving Per patient's wife he had 1 episode of diarrhea on 8/14 Plan: Dificid 200 mg p.o. twice daily, approved for co-pay assistance IV Flagyl 500 3  times daily Monitor for BM DC IVF Monitor labs Monitor vitals and fever curve  Chronic kidney disease stage IIIa Stable, IVF discontinued  History of orthostatic hypotension PTA midodrine  Dementia with behavioral disturbance Stable, monitor  Bilateral hearing loss Stable, monitor  Spermatocele of epididymis As needed Tylenol   DVT prophylaxis: SCD Code Status: Full Family Communication: Wife [name] [phone] on 8/14, 8/15 Disposition Plan: Status is: Inpatient  Remains inpatient appropriate because:Inpatient level of care appropriate due to severity of illness  Dispo: The patient is from: Home              Anticipated d/c is to: Home              Patient currently is not medically stable to d/c.   Difficult to place patient No  Acute colitis secondary to C. difficile     Level of care: Med-Surg  Consultants:  None  Procedures:  None  Antimicrobials:  Dificid Metronidazole   Subjective: Patient seen and examined.  Endorses not feeling well but denies pain.  Objective: Vitals:   06/24/21 1736 06/24/21 2037 06/25/21 0403 06/25/21 0731  BP: 111/61 108/65 120/67 (!) 107/56  Pulse: 62 (!) 56 (!) 57 61  Resp: '16 16 17 16  '$ Temp: 97.9 F (36.6 C) 97.7 F (36.5 C) 97.7 F (36.5 C) 98.4 F (36.9 C)  TempSrc: Oral   Oral  SpO2: 94% 95% 93% 99%  Weight:  Height:        Intake/Output Summary (Last 24 hours) at 06/25/2021 1126 Last data filed at 06/25/2021 0419 Gross per 24 hour  Intake --  Output 1050 ml  Net -1050 ml   Filed Weights   06/23/21 1020  Weight: 82 kg    Examination:  General exam: Sitting comfortably in bed.  Appears frail.  No acute distress Respiratory system: Clear to auscultation. Respiratory effort normal. Cardiovascular system: S1 & S2 heard, RRR. No JVD, murmurs, rubs, gallops or clicks. No pedal edema. Gastrointestinal system: Soft, nondistended, mild TTP, hypoactive bowel sounds Central nervous system: Alert,  oriented x2, no focal deficits Extremities: Symmetric 5 x 5 power. Skin: No rashes, lesions or ulcers Psychiatry: Judgement and insight appear normal. Mood & affect appropriate.     Data Reviewed: I have personally reviewed following labs and imaging studies  CBC: Recent Labs  Lab 06/23/21 1023 06/24/21 0439  WBC 17.1* 8.1  NEUTROABS  --  5.6  HGB 16.1 13.0  HCT 46.9 36.4*  MCV 95.3 92.2  PLT 123* 93*   Basic Metabolic Panel: Recent Labs  Lab 06/23/21 1023 06/24/21 0439  NA 140 137  K 4.6 3.8  CL 101 108  CO2 26 26  GLUCOSE 165* 93  BUN 16 13  CREATININE 1.54* 1.22  CALCIUM 9.3 8.0*   GFR: Estimated Creatinine Clearance: 50 mL/min (by C-G formula based on SCr of 1.22 mg/dL). Liver Function Tests: Recent Labs  Lab 06/24/21 0439  AST 17  ALT 18  ALKPHOS 51  BILITOT 1.2  PROT 5.4*  ALBUMIN 3.0*   No results for input(s): LIPASE, AMYLASE in the last 168 hours. No results for input(s): AMMONIA in the last 168 hours. Coagulation Profile: No results for input(s): INR, PROTIME in the last 168 hours. Cardiac Enzymes: No results for input(s): CKTOTAL, CKMB, CKMBINDEX, TROPONINI in the last 168 hours. BNP (last 3 results) No results for input(s): PROBNP in the last 8760 hours. HbA1C: No results for input(s): HGBA1C in the last 72 hours. CBG: No results for input(s): GLUCAP in the last 168 hours. Lipid Profile: No results for input(s): CHOL, HDL, LDLCALC, TRIG, CHOLHDL, LDLDIRECT in the last 72 hours. Thyroid Function Tests: No results for input(s): TSH, T4TOTAL, FREET4, T3FREE, THYROIDAB in the last 72 hours. Anemia Panel: No results for input(s): VITAMINB12, FOLATE, FERRITIN, TIBC, IRON, RETICCTPCT in the last 72 hours. Sepsis Labs: Recent Labs  Lab 06/23/21 1150 06/23/21 1344 06/24/21 0439  PROCALCITON  --   --  <0.10  LATICACIDVEN 2.0* 1.5  --     Recent Results (from the past 240 hour(s))  Blood culture (routine x 2)     Status: None (Preliminary  result)   Collection Time: 06/23/21 11:50 AM   Specimen: BLOOD  Result Value Ref Range Status   Specimen Description BLOOD BLOOD RIGHT WRIST  Final   Special Requests   Final    BOTTLES DRAWN AEROBIC AND ANAEROBIC Blood Culture results may not be optimal due to an inadequate volume of blood received in culture bottles   Culture   Final    NO GROWTH < 24 HOURS Performed at [facility], [address] [place], [place] [address]    Report Status PENDING  Incomplete  Blood culture (routine x 2)     Status: None (Preliminary result)   Collection Time: 06/23/21 12:00 PM   Specimen: BLOOD  Result Value Ref Range Status   Specimen Description BLOOD LEFT ANTECUBITAL  Final  Special Requests   Final    BOTTLES DRAWN AEROBIC AND ANAEROBIC Blood Culture adequate volume   Culture   Final    NO GROWTH < 24 HOURS Performed at [facility], [address] [place], [place] [address]    Report Status PENDING  Incomplete  Resp Panel by RT-PCR (Flu A&B, Covid) Nasopharyngeal Swab     Status: None   Collection Time: 06/23/21 12:00 PM   Specimen: Nasopharyngeal Swab; Nasopharyngeal(NP) swabs in vial transport medium  Result Value Ref Range Status   SARS Coronavirus 2 by RT PCR NEGATIVE NEGATIVE Final    Comment: (NOTE) SARS-CoV-2 target nucleic acids are NOT DETECTED.  The SARS-CoV-2 RNA is generally detectable in upper respiratory specimens during the acute phase of infection. The lowest concentration of SARS-CoV-2 viral copies this assay can detect is 138 copies/mL. A negative result does not preclude SARS-Cov-2 infection and should not be used as the sole basis for treatment or other patient management decisions. A negative result may occur with  improper specimen collection/handling, submission of specimen other than nasopharyngeal swab, presence of viral mutation(s) within the areas targeted by this assay, and inadequate number of viral copies(<138 copies/mL). A  negative result must be combined with clinical observations, patient history, and epidemiological information. The expected result is Negative.  Fact Sheet for Patients:  EntrepreneurPulse.com.au  Fact Sheet for Healthcare Providers:  IncredibleEmployment.be  This test is no t yet approved or cleared by the [place] FDA and  has been authorized for detection and/or diagnosis of SARS-CoV-2 by FDA under an Emergency Use Authorization (EUA). This EUA will remain  in effect (meaning this test can be used) for the duration of the COVID-19 declaration under Section 564(b)(1) of the Act, 21 U.S.C.section 360bbb-3(b)(1), unless the authorization is terminated  or revoked sooner.       Influenza A by PCR NEGATIVE NEGATIVE Final   Influenza B by PCR NEGATIVE NEGATIVE Final    Comment: (NOTE) The Xpert Xpress SARS-CoV-2/FLU/RSV plus assay is intended as an aid in the diagnosis of influenza from Nasopharyngeal swab specimens and should not be used as a sole basis for treatment. Nasal washings and aspirates are unacceptable for Xpert Xpress SARS-CoV-2/FLU/RSV testing.  Fact Sheet for Patients: EntrepreneurPulse.com.au  Fact Sheet for Healthcare Providers: IncredibleEmployment.be  This test is not yet approved or cleared by the [place] FDA and has been authorized for detection and/or diagnosis of SARS-CoV-2 by FDA under an Emergency Use Authorization (EUA). This EUA will remain in effect (meaning this test can be used) for the duration of the COVID-19 declaration under Section 564(b)(1) of the Act, 21 U.S.C. section 360bbb-3(b)(1), unless the authorization is terminated or revoked.  Performed at [facility], [address], [place] [address]   C Difficile Quick Screen w PCR reflex     Status: Abnormal   Collection Time: 06/23/21 11:30 PM   Specimen: STOOL  Result Value Ref Range  Status   C Diff antigen POSITIVE (A) NEGATIVE Final   C Diff toxin POSITIVE (A) NEGATIVE Final   C Diff interpretation Toxin producing C. difficile detected.  Final    Comment: CRITICAL RESULT CALLED TO, READ BACK BY AND VERIFIED WITH: DENNIS [facility] AT 0211 ON 06/24/21 BY SS Performed at [facility], [address] [place], [place] [address]   Gastrointestinal Panel by PCR , Stool     Status: None   Collection Time: 06/23/21 11:30 PM   Specimen: Stool  Result Value Ref  Range Status   Campylobacter species NOT DETECTED NOT DETECTED Final   Plesimonas shigelloides NOT DETECTED NOT DETECTED Final   Salmonella species NOT DETECTED NOT DETECTED Final   Yersinia enterocolitica NOT DETECTED NOT DETECTED Final   Vibrio species NOT DETECTED NOT DETECTED Final   Vibrio cholerae NOT DETECTED NOT DETECTED Final   Enteroaggregative E coli (EAEC) NOT DETECTED NOT DETECTED Final   Enteropathogenic E coli (EPEC) NOT DETECTED NOT DETECTED Final   Enterotoxigenic E coli (ETEC) NOT DETECTED NOT DETECTED Final   Shiga like toxin producing E coli (STEC) NOT DETECTED NOT DETECTED Final   Shigella/Enteroinvasive E coli (EIEC) NOT DETECTED NOT DETECTED Final   Cryptosporidium NOT DETECTED NOT DETECTED Final   Cyclospora cayetanensis NOT DETECTED NOT DETECTED Final   Entamoeba histolytica NOT DETECTED NOT DETECTED Final   Giardia lamblia NOT DETECTED NOT DETECTED Final   Adenovirus F40/41 NOT DETECTED NOT DETECTED Final   Astrovirus NOT DETECTED NOT DETECTED Final   Norovirus GI/GII NOT DETECTED NOT DETECTED Final   Rotavirus A NOT DETECTED NOT DETECTED Final   Sapovirus (I, II, IV, and V) NOT DETECTED NOT DETECTED Final    Comment: Performed at [facility], [address] [place], [place] [address]         Radiology Studies: No results found.      Scheduled Meds:  fidaxomicin  200 mg Oral BID   midodrine  2.5 mg Oral TID WC   Continuous Infusions:  metronidazole 500  mg (06/25/21 0954)     LOS: 2 days    Time spent: 25 minutes    Sidney Ace, MD [facility] Pager [phone]  If 7PM-7AM, please contact night-coverage 06/25/2021, 11:26 AM

## 2021-06-25 NOTE — Progress Notes (Signed)
Physical Therapy Treatment Patient Details Name: Dylan [name] MRN: [id number] DOB: [birth year] Today's Date: 06/25/2021    History of Present Illness 87 year old white male with a history of dementia, CKD stage III, depression presents to the ER today with recurrent diarrhea.  Patient was in the hospital 2 weeks ago.  He was diagnosed with C. difficile colitis.  Patient completed 10 days of oral vancomycin at home.  Wife states that his last dose was last Saturday. He was diagnosed with C-diff and admitted for inpatient treatment. Patient started on IV antibiotics and given fluid.    PT Comments    Pt received supine in bed, agreeable to therapy. His wife remained in room during session. He demo incontinence of bladder prior to ambulation soaking through the disposable brief, pt reports he was unaware. Pt increased ambulation distance to 242f; pt was easily distracted by objects and people in the hallway. VC to return to task. He sat on the EOB while PT gathered new linens. He stood with SUP as PT changed the linens. He ended session sitting EOB with NA preparing to bathe him. He continues to demo occasional LOB during ambulation and HHA during STS to pull himself to standing. Would benefit from skilled PT to address above deficits and promote optimal return to PLOF.   Follow Up Recommendations  No PT follow up;Supervision/Assistance - 24 hour     Equipment Recommendations  None recommended by PT    Recommendations for Other Services       Precautions / Restrictions Precautions Precautions: Fall Restrictions Weight Bearing Restrictions: No    Mobility  Bed Mobility Overal bed mobility: Modified Independent                  Transfers Overall transfer level: Needs assistance Equipment used: None;1 person hand held assist Transfers: Sit to/from Stand Sit to Stand: Supervision;Min assist         General transfer comment: Pt utilizes HHA to assist with pulling himself to  standing.  Ambulation/Gait Ambulation/Gait assistance: Min guard Gait Distance (Feet): 200 Feet Assistive device: None Gait Pattern/deviations: WFL(Within Functional Limits);Step-through pattern Gait velocity: decreased   General Gait Details: CGA provided for occasioanl steadying. Reciprocal pattern, normal base of support, erect posture; increased distraction while in hallway,  he reaches for objects and handrails however does not require them for assist.   Stairs             Wheelchair Mobility    Modified Rankin (Stroke Patients Only)       Balance Overall balance assessment: Needs assistance (able to stand unsupported without loss of balance)   Sitting balance-Leahy Scale: Normal     Standing balance support: No upper extremity supported Standing balance-Leahy Scale: Good Standing balance comment: SUP-CGA required during all standing activities.                            Cognition Arousal/Alertness: Awake/alert Behavior During Therapy: WFL for tasks assessed/performed Overall Cognitive Status: History of cognitive impairments - at baseline                                 General Comments: has dementia; requires simple commands      Exercises Other Exercises Other Exercises: Pt was incontinent of bladder prior to session. Pt sat EOB as PT collected supplies to change bed linens. He stood with wife supervising as  PT changed linens. He remained sitting EOB at end of session as NA was entering to bathe pt.    General Comments        Pertinent Vitals/Pain Pain Assessment: Faces Faces Pain Scale: Hurts whole lot Pain Location: scrotum during eccentric STS/initial contact with surface Pain Intervention(s): Patient requesting pain meds-RN notified;Limited activity within patient's tolerance    Home Living                      Prior Function            PT Goals (current goals can now be found in the care plan section)  Acute Rehab PT Goals Patient Stated Goal: to go home    Frequency    Min 2X/week      PT Plan      Co-evaluation              AM-PAC PT "6 Clicks" Mobility   Outcome Measure  Help needed turning from your back to your side while in a flat bed without using bedrails?: None Help needed moving from lying on your back to sitting on the side of a flat bed without using bedrails?: None Help needed moving to and from a bed to a chair (including a wheelchair)?: A Little Help needed standing up from a chair using your arms (e.g., wheelchair or bedside chair)?: A Little Help needed to walk in hospital room?: A Little Help needed climbing 3-5 steps with a railing? : A Little 6 Click Score: 20    End of Session Equipment Utilized During Treatment: Gait belt Activity Tolerance: Patient tolerated treatment well Patient left: with call bell/phone within reach;with family/visitor present;in bed;with nursing/sitter in room Nurse Communication: Mobility status;Patient requests pain meds PT Visit Diagnosis: Difficulty in walking, not elsewhere classified (R26.2)     Time: 1015-1040 PT Time Calculation (min) (ACUTE ONLY): 25 min  Charges:  $Therapeutic Activity: 23-37 mins                      Patrina Levering PT, DPT 06/25/21 1:12 PM [id number]

## 2021-06-25 NOTE — TOC Benefit Eligibility Note (Signed)
Patient Advocate Encounter  Patient is approved through the Clostridium Difficile Associated Diarrhea Copay Assistance for Dificid through [employer].      Lyndel Safe, [place] Patient Advocate Specialist [place] [facility] Stewardship Team Direct Number: [phone]  Fax: [phone]

## 2021-06-26 ENCOUNTER — Other Ambulatory Visit (HOSPITAL_COMMUNITY): Payer: Self-pay

## 2021-06-26 DIAGNOSIS — K529 Noninfective gastroenteritis and colitis, unspecified: Secondary | ICD-10-CM | POA: Diagnosis not present

## 2021-06-26 MED ORDER — METRONIDAZOLE 500 MG PO TABS
500.0000 mg | ORAL_TABLET | Freq: Three times a day (TID) | ORAL | Status: DC
  Administered 2021-06-26 – 2021-06-28 (×7): 500 mg via ORAL
  Filled 2021-06-26 (×7): qty 1

## 2021-06-26 MED ORDER — SERTRALINE HCL 50 MG PO TABS
50.0000 mg | ORAL_TABLET | Freq: Every day | ORAL | Status: DC
  Administered 2021-06-26 – 2021-06-28 (×3): 50 mg via ORAL
  Filled 2021-06-26 (×3): qty 1

## 2021-06-26 MED ORDER — FINASTERIDE 5 MG PO TABS
5.0000 mg | ORAL_TABLET | Freq: Every day | ORAL | Status: DC
  Administered 2021-06-26 – 2021-06-28 (×3): 5 mg via ORAL
  Filled 2021-06-26 (×3): qty 1

## 2021-06-26 NOTE — Progress Notes (Signed)
Physical Therapy Treatment Patient Details Name: Dylan [name] MRN: [id number] DOB: 1934-06-06 Today's Date: 06/26/2021    History of Present Illness 87 year old white male with a history of dementia, CKD stage III, depression presents to the ER today with recurrent diarrhea.  Patient was in the hospital 2 weeks ago.  He was diagnosed with C. difficile colitis.  Patient completed 10 days of oral vancomycin at home.  Wife states that his last dose was last Saturday. He was diagnosed with C-diff and admitted for inpatient treatment. Patient started on IV antibiotics and given fluid.    PT Comments    Pt received supine in bed, agreeable to therapy, wife remained in room throughout session. Pt continues to perform bed mobility Mod I although he does require VC for safety. Pt doubled ambulation distance from 223f to 4065fwith CGA for minimal steadying as pt becomes easily distracted by other people and objects in the hallway leading to multiple head turns and sustained head turns. Pt was incontinent of bladder during ambulation. Pt performed additional bed mobility to assist with hygiene and changing disposable brief. Would benefit from skilled PT to address above deficits and promote optimal return to PLOF.    Follow Up Recommendations  No PT follow up;Supervision/Assistance - 24 hour     Equipment Recommendations  None recommended by PT    Recommendations for Other Services       Precautions / Restrictions Precautions Precautions: Fall Restrictions Weight Bearing Restrictions: No    Mobility  Bed Mobility Overal bed mobility: Modified Independent             General bed mobility comments: for bilateral rolling, repositioning in bed, supine<>sit    Transfers Overall transfer level: Needs assistance Equipment used: None;1 person hand held assist Transfers: Sit to/from Stand Sit to Stand: Supervision;Min assist         General transfer comment: Pt utilizes bilateral HHA  to assist with pulling himself to standing and steadying  Ambulation/Gait Ambulation/Gait assistance: Min guard Gait Distance (Feet): 400 Feet Assistive device: None Gait Pattern/deviations: WFL(Within Functional Limits);Step-through pattern Gait velocity: decreased   General Gait Details: CGA provided for minimal steadying. Reciprocal pattern, normal base of support, erect posture; increased distraction while in hallway.   Stairs             Wheelchair Mobility    Modified Rankin (Stroke Patients Only)       Balance Overall balance assessment: Needs assistance (able to stand unsupported without loss of balance)   Sitting balance-Leahy Scale: Normal     Standing balance support: No upper extremity supported Standing balance-Leahy Scale: Good Standing balance comment: SUP-CGA required during all standing activities.                            Cognition Arousal/Alertness: Awake/alert Behavior During Therapy: WFL for tasks assessed/performed Overall Cognitive Status: History of cognitive impairments - at baseline                                 General Comments: has dementia; requires simple commands      Exercises Other Exercises Other Exercises: Pt was incontinent of bladder during session. He performed bilateral rolling and bridging to assist with disposable brief change. Mod I for mobility; VC for sequencing. Pt wife assisted with pericare and changing gown. PT retrieved clean chuck, blankets, gown.    General  Comments        Pertinent Vitals/Pain Pain Assessment: Faces Faces Pain Scale: Hurts whole lot Pain Location: scrotum during hygiene and brief change Pain Intervention(s): Limited activity within patient's tolerance;Monitored during session    Home Living                      Prior Function            PT Goals (current goals can now be found in the care plan section) Acute Rehab PT Goals Patient Stated Goal:  to go home    Frequency    Min 2X/week      PT Plan      Co-evaluation              AM-PAC PT "6 Clicks" Mobility   Outcome Measure  Help needed turning from your back to your side while in a flat bed without using bedrails?: None Help needed moving from lying on your back to sitting on the side of a flat bed without using bedrails?: None Help needed moving to and from a bed to a chair (including a wheelchair)?: A Little Help needed standing up from a chair using your arms (e.g., wheelchair or bedside chair)?: A Little Help needed to walk in hospital room?: A Little Help needed climbing 3-5 steps with a railing? : A Little 6 Click Score: 20    End of Session Equipment Utilized During Treatment: Gait belt Activity Tolerance: Patient tolerated treatment well Patient left: with call bell/phone within reach;with family/visitor present;in bed;with bed alarm set Nurse Communication: Mobility status PT Visit Diagnosis: Difficulty in walking, not elsewhere classified (R26.2)     Time: [id number] PT Time Calculation (min) (ACUTE ONLY): 23 min  Charges:  $Therapeutic Activity: 23-37 mins                     Patrina Levering PT, DPT 06/26/21 12:12 PM [id number]

## 2021-06-26 NOTE — Progress Notes (Signed)
PROGRESS NOTE    Dylan [name]  [id number] DOB: 12-Sep-1934 DOA: 06/23/2021 PCP: [name], MD    Brief Narrative:  87 year old white male with a history of dementia, CKD stage III, depression presents to the ER today with recurrent diarrhea.  Patient was in the hospital 2 weeks ago.  He was diagnosed with C. difficile colitis.  Patient completed 10 days of oral vancomycin at home.  Wife states that his last dose was last Saturday.  Patient has been doing better over the last 7 days.  Today, the wife found the patient in the bedroom with the bed covered in liquid stool.  Patient also had defecated on the floor.  She got the patient up to the bathroom where he had another diarrheal bowel movement in the toilet.  She got him into the shower.  While she was cleaning them off, the patient had a presyncopal episode.  She states the patient became very pale white.  He did not lose consciousness however.  After several minutes, the patient came to.  She brought him to the ER.  CT abdomen pelvis significant for colitis.  White count on admission elevated to 17.  No diarrhea and/or since admission.  Per the wife the diarrhea smelled exactly like previous episode of C. difficile.  C. difficile positive here.  Per patient's wife he had 1 episode of loose stool on 8/14.  Still having hyperactive bowel sounds   Assessment & Plan:   Principal Problem:   Acute colitis - suspected c. diff colitis present on admission Active Problems:   Spermatocele of epididymis, single   Dementia with behavioral disturbance (HCC)   Bilateral hearing loss   CKD (chronic kidney disease) stage 3, GFR 30-59 ml/min (HCC)   Sepsis with acute organ dysfunction (HCC)   Orthostatic hypotension   Leukocytosis  Acute C. difficile colitis, first recurrence Sepsis secondary to above Sepsis physiology improving Per patient's wife he had 1 episode of diarrhea on 8/14 Plan: Dificid 200 mg p.o. twice daily, approved for  co-pay assistance.  Plan for 10-day course Change Flagyl to p.o., continue 500 3 times daily Monitor for BM Ideally would like for BM prior to discharge Anticipated date of discharge 8/17  Chronic kidney disease stage IIIa Stable, IVF discontinued  History of orthostatic hypotension PTA midodrine  Dementia with behavioral disturbance Stable, monitor  Bilateral hearing loss Stable, monitor  Spermatocele of epididymis As needed Tylenol   DVT prophylaxis: SCD Code Status: Full Family Communication: Wife [name] [phone] on 8/14, 8/15, 8/16 Disposition Plan: Status is: Inpatient  Remains inpatient appropriate because:Inpatient level of care appropriate due to severity of illness  Dispo: The patient is from: Home              Anticipated d/c is to: Home              Patient currently is not medically stable to d/c.   Difficult to place patient No  Acute colitis in the setting of C. difficile.  Will monitor overnight.  Anticipated date of discharge 8/17     Level of care: Med-Surg  Consultants:  None  Procedures:  None  Antimicrobials:  Dificid Metronidazole   Subjective: Patient seen and examined.  Endorses not feeling well but denies pain.  Objective: Vitals:   06/25/21 2038 06/26/21 0548 06/26/21 0732 06/26/21 1148  BP: (!) 110/56 (!) 126/56 131/63 112/68  Pulse: 70 94 68 (!) 52  Resp: '20 20 17 15  '$ Temp: 99.1 F (37.3  C) 98 F (36.7 C) 98.6 F (37 C) 98.2 F (36.8 C)  TempSrc: Oral     SpO2: 96% 97% 98% 96%  Weight:      Height:        Intake/Output Summary (Last 24 hours) at 06/26/2021 1258 Last data filed at 06/25/2021 1500 Gross per 24 hour  Intake 500 ml  Output --  Net 500 ml   Filed Weights   06/23/21 1020  Weight: 82 kg    Examination:  General exam: Sitting comfortably in bed.  Appears frail.  No acute distress Respiratory system: Clear to auscultation. Respiratory effort normal. Cardiovascular system: S1 & S2 heard, RRR.  No JVD, murmurs, rubs, gallops or clicks. No pedal edema. Gastrointestinal system: Soft, nondistended, mild TTP, hypoactive bowel sounds Central nervous system: Alert, oriented x2, no focal deficits Extremities: Symmetric 5 x 5 power. Skin: No rashes, lesions or ulcers Psychiatry: Judgement and insight appear normal. Mood & affect appropriate.     Data Reviewed: I have personally reviewed following labs and imaging studies  CBC: Recent Labs  Lab 06/23/21 1023 06/24/21 0439  WBC 17.1* 8.1  NEUTROABS  --  5.6  HGB 16.1 13.0  HCT 46.9 36.4*  MCV 95.3 92.2  PLT 123* 93*   Basic Metabolic Panel: Recent Labs  Lab 06/23/21 1023 06/24/21 0439  NA 140 137  K 4.6 3.8  CL 101 108  CO2 26 26  GLUCOSE 165* 93  BUN 16 13  CREATININE 1.54* 1.22  CALCIUM 9.3 8.0*   GFR: Estimated Creatinine Clearance: 50 mL/min (by C-G formula based on SCr of 1.22 mg/dL). Liver Function Tests: Recent Labs  Lab 06/24/21 0439  AST 17  ALT 18  ALKPHOS 51  BILITOT 1.2  PROT 5.4*  ALBUMIN 3.0*   No results for input(s): LIPASE, AMYLASE in the last 168 hours. No results for input(s): AMMONIA in the last 168 hours. Coagulation Profile: No results for input(s): INR, PROTIME in the last 168 hours. Cardiac Enzymes: No results for input(s): CKTOTAL, CKMB, CKMBINDEX, TROPONINI in the last 168 hours. BNP (last 3 results) No results for input(s): PROBNP in the last 8760 hours. HbA1C: No results for input(s): HGBA1C in the last 72 hours. CBG: No results for input(s): GLUCAP in the last 168 hours. Lipid Profile: No results for input(s): CHOL, HDL, LDLCALC, TRIG, CHOLHDL, LDLDIRECT in the last 72 hours. Thyroid Function Tests: No results for input(s): TSH, T4TOTAL, FREET4, T3FREE, THYROIDAB in the last 72 hours. Anemia Panel: No results for input(s): VITAMINB12, FOLATE, FERRITIN, TIBC, IRON, RETICCTPCT in the last 72 hours. Sepsis Labs: Recent Labs  Lab 06/23/21 1150 06/23/21 1344  06/24/21 0439  PROCALCITON  --   --  <0.10  LATICACIDVEN 2.0* 1.5  --     Recent Results (from the past 240 hour(s))  Blood culture (routine x 2)     Status: None (Preliminary result)   Collection Time: 06/23/21 11:50 AM   Specimen: BLOOD  Result Value Ref Range Status   Specimen Description BLOOD BLOOD RIGHT WRIST  Final   Special Requests   Final    BOTTLES DRAWN AEROBIC AND ANAEROBIC Blood Culture results may not be optimal due to an inadequate volume of blood received in culture bottles   Culture   Final    NO GROWTH 3 DAYS Performed at [facility], [address] [place],  [address]    Report Status PENDING  Incomplete  Blood culture (routine x 2)     Status:  None (Preliminary result)   Collection Time: 06/23/21 12:00 PM   Specimen: BLOOD  Result Value Ref Range Status   Specimen Description BLOOD LEFT ANTECUBITAL  Final   Special Requests   Final    BOTTLES DRAWN AEROBIC AND ANAEROBIC Blood Culture adequate volume   Culture   Final    NO GROWTH 3 DAYS Performed at [facility], [address], [place], [place] [address]    Report Status PENDING  Incomplete  Resp Panel by RT-PCR (Flu A&B, Covid) Nasopharyngeal Swab     Status: None   Collection Time: 06/23/21 12:00 PM   Specimen: Nasopharyngeal Swab; Nasopharyngeal(NP) swabs in vial transport medium  Result Value Ref Range Status   SARS Coronavirus 2 by RT PCR NEGATIVE NEGATIVE Final    Comment: (NOTE) SARS-CoV-2 target nucleic acids are NOT DETECTED.  The SARS-CoV-2 RNA is generally detectable in upper respiratory specimens during the acute phase of infection. The lowest concentration of SARS-CoV-2 viral copies this assay can detect is 138 copies/mL. A negative result does not preclude SARS-Cov-2 infection and should not be used as the sole basis for treatment or other patient management decisions. A negative result may occur with  improper specimen collection/handling, submission of  specimen other than nasopharyngeal swab, presence of viral mutation(s) within the areas targeted by this assay, and inadequate number of viral copies(<138 copies/mL). A negative result must be combined with clinical observations, patient history, and epidemiological information. The expected result is Negative.  Fact Sheet for Patients:  EntrepreneurPulse.com.au  Fact Sheet for Healthcare Providers:  IncredibleEmployment.be  This test is no t yet approved or cleared by the [place] FDA and  has been authorized for detection and/or diagnosis of SARS-CoV-2 by FDA under an Emergency Use Authorization (EUA). This EUA will remain  in effect (meaning this test can be used) for the duration of the COVID-19 declaration under Section 564(b)(1) of the Act, 21 U.S.C.section 360bbb-3(b)(1), unless the authorization is terminated  or revoked sooner.       Influenza A by PCR NEGATIVE NEGATIVE Final   Influenza B by PCR NEGATIVE NEGATIVE Final    Comment: (NOTE) The Xpert Xpress SARS-CoV-2/FLU/RSV plus assay is intended as an aid in the diagnosis of influenza from Nasopharyngeal swab specimens and should not be used as a sole basis for treatment. Nasal washings and aspirates are unacceptable for Xpert Xpress SARS-CoV-2/FLU/RSV testing.  Fact Sheet for Patients: EntrepreneurPulse.com.au  Fact Sheet for Healthcare Providers: IncredibleEmployment.be  This test is not yet approved or cleared by the [place] FDA and has been authorized for detection and/or diagnosis of SARS-CoV-2 by FDA under an Emergency Use Authorization (EUA). This EUA will remain in effect (meaning this test can be used) for the duration of the COVID-19 declaration under Section 564(b)(1) of the Act, 21 U.S.C. section 360bbb-3(b)(1), unless the authorization is terminated or revoked.  Performed at [facility], [place], [place] [address]   C Difficile Quick Screen w PCR reflex     Status: Abnormal   Collection Time: 06/23/21 11:30 PM   Specimen: STOOL  Result Value Ref Range Status   C Diff antigen POSITIVE (A) NEGATIVE Final   C Diff toxin POSITIVE (A) NEGATIVE Final   C Diff interpretation Toxin producing C. difficile detected.  Final    Comment: CRITICAL RESULT CALLED TO, READ BACK BY AND VERIFIED WITH: DENNIS [facility] AT 0211 ON 06/24/21 BY SS Performed at [facility], [address] [place], [place]  [address]   Gastrointestinal Panel by PCR , Stool     Status: None   Collection Time: 06/23/21 11:30 PM   Specimen: Stool  Result Value Ref Range Status   Campylobacter species NOT DETECTED NOT DETECTED Final   Plesimonas shigelloides NOT DETECTED NOT DETECTED Final   Salmonella species NOT DETECTED NOT DETECTED Final   Yersinia enterocolitica NOT DETECTED NOT DETECTED Final   Vibrio species NOT DETECTED NOT DETECTED Final   Vibrio cholerae NOT DETECTED NOT DETECTED Final   Enteroaggregative E coli (EAEC) NOT DETECTED NOT DETECTED Final   Enteropathogenic E coli (EPEC) NOT DETECTED NOT DETECTED Final   Enterotoxigenic E coli (ETEC) NOT DETECTED NOT DETECTED Final   Shiga like toxin producing E coli (STEC) NOT DETECTED NOT DETECTED Final   Shigella/Enteroinvasive E coli (EIEC) NOT DETECTED NOT DETECTED Final   Cryptosporidium NOT DETECTED NOT DETECTED Final   Cyclospora cayetanensis NOT DETECTED NOT DETECTED Final   Entamoeba histolytica NOT DETECTED NOT DETECTED Final   Giardia lamblia NOT DETECTED NOT DETECTED Final   Adenovirus F40/41 NOT DETECTED NOT DETECTED Final   Astrovirus NOT DETECTED NOT DETECTED Final   Norovirus GI/GII NOT DETECTED NOT DETECTED Final   Rotavirus A NOT DETECTED NOT DETECTED Final   Sapovirus (I, II, IV, and V) NOT DETECTED NOT DETECTED Final    Comment: Performed at [facility], [address], [place], [place] [address]          Radiology Studies: No results found.      Scheduled Meds:  fidaxomicin  200 mg Oral BID   finasteride  5 mg Oral Daily   midodrine  2.5 mg Oral TID WC   sertraline  50 mg Oral Daily   Continuous Infusions:  metronidazole 500 mg (06/26/21 1015)     LOS: 3 days    Time spent: 15 minutes    Sidney Ace, MD [facility] Pager [phone]  If 7PM-7AM, please contact night-coverage 06/26/2021, 12:58 PM

## 2021-06-26 NOTE — Progress Notes (Signed)
Pharmacy - Antimicrobial Stewardship  After receiving approval for copay assistance for fidaxomicin, prescription sent to [facility] outpatient pharmacy.  Prescription was picked up at pharmacy and delivered to room and given to spouse today.  Reviewed how to take medication and that risk of side effects low since not absorbed.    Doreene Eland, PharmD, BCPS.   Work Cell: [phone] 06/26/2021 11:21 AM

## 2021-06-26 NOTE — Plan of Care (Signed)
No BM since 06/23/21 Contact Isolation: C-Diff + Must have a form BM before discharging from hospital   Problem: Elimination: Goal: Will not experience complications related to bowel motility 06/26/2021 1613 by ,  Bet, LPN Outcome: Progressing 06/26/2021 1610 by ,  Bet, LPN Outcome: Not Met (add Reason)    

## 2021-06-27 ENCOUNTER — Encounter: Payer: Medicare Other | Admitting: Student

## 2021-06-27 DIAGNOSIS — K529 Noninfective gastroenteritis and colitis, unspecified: Secondary | ICD-10-CM | POA: Diagnosis not present

## 2021-06-27 LAB — BASIC METABOLIC PANEL
Anion gap: 8 (ref 5–15)
BUN: 15 mg/dL (ref 8–23)
CO2: 27 mmol/L (ref 22–32)
Calcium: 8.8 mg/dL — ABNORMAL LOW (ref 8.9–10.3)
Chloride: 104 mmol/L (ref 98–111)
Creatinine, Ser: 1.32 mg/dL — ABNORMAL HIGH (ref 0.61–1.24)
GFR, Estimated: 53 mL/min — ABNORMAL LOW (ref >60)
Glucose, Bld: 99 mg/dL (ref 70–99)
Potassium: 3.9 mmol/L (ref 3.5–5.1)
Sodium: 139 mmol/L (ref 135–145)

## 2021-06-27 MED ORDER — SODIUM CHLORIDE 0.45 % IV SOLN: INTRAVENOUS | Status: DC

## 2021-06-27 NOTE — Progress Notes (Signed)
PROGRESS NOTE    Dylan [name]  [id number] DOB: 02-14-1934 DOA: 06/23/2021 PCP: [name], MD    Brief Narrative:  87 year old white male with a history of dementia, CKD stage III, depression presents to the ER today with recurrent diarrhea.  Patient was in the hospital 2 weeks ago.  He was diagnosed with C. difficile colitis.  Patient completed 10 days of oral vancomycin at home.  Wife states that his last dose was last Saturday.  Patient has been doing better over the last 7 days.  Today, the wife found the patient in the bedroom with the bed covered in liquid stool.  Patient also had defecated on the floor.  She got the patient up to the bathroom where he had another diarrheal bowel movement in the toilet.  She got him into the shower.  While she was cleaning them off, the patient had a presyncopal episode.  She states the patient became very pale white.  He did not lose consciousness however.  After several minutes, the patient came to.  She brought him to the ER.  CT abdomen pelvis significant for colitis.  White count on admission elevated to 17.  No diarrhea and/or since admission.  Per the wife the diarrhea smelled exactly like previous episode of C. difficile.  C. difficile positive here.  Per patient's wife he had 1 episode of loose stool on 8/14.  Still having hyperactive bowel sounds  8/17 Per nursing record patient had 5 stools yesterday however wife stated he had diarrhea until midnight.  Today is more mushy.  No abdominal pain, chest pain, nausea or vomiting   Assessment & Plan:   Principal Problem:   Acute colitis - suspected c. diff colitis present on admission Active Problems:   Spermatocele of epididymis, single   Dementia with behavioral disturbance (HCC)   Bilateral hearing loss   CKD (chronic kidney disease) stage 3, GFR 30-59 ml/min (HCC)   Sepsis with acute organ dysfunction (HCC)   Orthostatic hypotension   Leukocytosis  Acute C. difficile colitis,  first recurrence Sepsis secondary to above Sepsis physiology improving 8/17 still with quite a bit of diarrhea, starting to form but will monitor today to make sure he is slowing down and its more forming Continue Dificid, approved for co-pay assistance Plan for 10-day course Continue Flagyl 500 3 times daily Electrolytes stable   Acute on chronic kidney disease stage IIIa Likely prerenal due to diarrhea Resume half-normal saline at 50 mils per hour  History of orthostatic hypotension Continue midodrine  Dementia with behavioral disturbance Stable monitor  Bilateral hearing loss Stable, monitor  Spermatocele of epididymis As needed Tylenol   DVT prophylaxis: SCD Code Status: Full Family Communication: Wife at bedside  Disposition Plan: Status is: Inpatient  Remains inpatient appropriate because:Inpatient level of care appropriate due to severity of illness  Dispo: The patient is from: Home              Anticipated d/c is to: Home              Patient currently is not medically stable to d/c.   Difficult to place patient No  Acute colitis in the setting of C. difficile.  Still with diarrhea and prerenal azotemia needs IV fluids  Level of care: Med-Surg  Consultants:  None  Procedures:  None  Antimicrobials:  Dificid Metronidazole   Subjective: As above.  No abdominal pain no shortness of breath or nausea Objective: Vitals:   06/26/21 2012 06/27/21 [id number]  06/27/21 0830 06/27/21 1138  BP: 108/66 110/60 (!) 109/57 115/64  Pulse: (!) 55 (!) 50 (!) 54 (!) 54  Resp: '20 18 18 15  '$ Temp: 98 F (36.7 C) 98.5 F (36.9 C) (!) 97.4 F (36.3 C) 98.4 F (36.9 C)  TempSrc:  Oral Oral   SpO2: 94% 94% 92% 95%  Weight:      Height:       No intake or output data in the 24 hours ending 06/27/21 1505  Filed Weights   06/23/21 1020  Weight: 82 kg    Examination:  Calm, pleasant NAD CTA no wheeze rales rhonchi's Regular S1-S2 no gallops Soft benign positive  bowel sounds No edema Awake and alert, grossly intact    Data Reviewed: I have personally reviewed following labs and imaging studies  CBC: Recent Labs  Lab 06/23/21 1023 06/24/21 0439  WBC 17.1* 8.1  NEUTROABS  --  5.6  HGB 16.1 13.0  HCT 46.9 36.4*  MCV 95.3 92.2  PLT 123* 93*   Basic Metabolic Panel: Recent Labs  Lab 06/23/21 1023 06/24/21 0439 06/27/21 0854  NA 140 137 139  K 4.6 3.8 3.9  CL 101 108 104  CO2 '26 26 27  '$ GLUCOSE 165* 93 99  BUN '16 13 15  '$ CREATININE 1.54* 1.22 1.32*  CALCIUM 9.3 8.0* 8.8*   GFR: Estimated Creatinine Clearance: 46.2 mL/min (A) (by C-G formula based on SCr of 1.32 mg/dL (H)). Liver Function Tests: Recent Labs  Lab 06/24/21 0439  AST 17  ALT 18  ALKPHOS 51  BILITOT 1.2  PROT 5.4*  ALBUMIN 3.0*   No results for input(s): LIPASE, AMYLASE in the last 168 hours. No results for input(s): AMMONIA in the last 168 hours. Coagulation Profile: No results for input(s): INR, PROTIME in the last 168 hours. Cardiac Enzymes: No results for input(s): CKTOTAL, CKMB, CKMBINDEX, TROPONINI in the last 168 hours. BNP (last 3 results) No results for input(s): PROBNP in the last 8760 hours. HbA1C: No results for input(s): HGBA1C in the last 72 hours. CBG: No results for input(s): GLUCAP in the last 168 hours. Lipid Profile: No results for input(s): CHOL, HDL, LDLCALC, TRIG, CHOLHDL, LDLDIRECT in the last 72 hours. Thyroid Function Tests: No results for input(s): TSH, T4TOTAL, FREET4, T3FREE, THYROIDAB in the last 72 hours. Anemia Panel: No results for input(s): VITAMINB12, FOLATE, FERRITIN, TIBC, IRON, RETICCTPCT in the last 72 hours. Sepsis Labs: Recent Labs  Lab 06/23/21 1150 06/23/21 1344 06/24/21 0439  PROCALCITON  --   --  <0.10  LATICACIDVEN 2.0* 1.5  --     Recent Results (from the past 240 hour(s))  Blood culture (routine x 2)     Status: None (Preliminary result)   Collection Time: 06/23/21 11:50 AM   Specimen: BLOOD   Result Value Ref Range Status   Specimen Description BLOOD BLOOD RIGHT WRIST  Final   Special Requests   Final    BOTTLES DRAWN AEROBIC AND ANAEROBIC Blood Culture results may not be optimal due to an inadequate volume of blood received in culture bottles   Culture   Final    NO GROWTH 4 DAYS Performed at [facility], [address] [place], [place] [address]    Report Status PENDING  Incomplete  Blood culture (routine x 2)     Status: None (Preliminary result)   Collection Time: 06/23/21 12:00 PM   Specimen: BLOOD  Result Value Ref Range Status   Specimen Description BLOOD LEFT ANTECUBITAL  Final  Special Requests   Final    BOTTLES DRAWN AEROBIC AND ANAEROBIC Blood Culture adequate volume   Culture   Final    NO GROWTH 4 DAYS Performed at [facility], [address] [place], [place] [address]    Report Status PENDING  Incomplete  Resp Panel by RT-PCR (Flu A&B, Covid) Nasopharyngeal Swab     Status: None   Collection Time: 06/23/21 12:00 PM   Specimen: Nasopharyngeal Swab; Nasopharyngeal(NP) swabs in vial transport medium  Result Value Ref Range Status   SARS Coronavirus 2 by RT PCR NEGATIVE NEGATIVE Final    Comment: (NOTE) SARS-CoV-2 target nucleic acids are NOT DETECTED.  The SARS-CoV-2 RNA is generally detectable in upper respiratory specimens during the acute phase of infection. The lowest concentration of SARS-CoV-2 viral copies this assay can detect is 138 copies/mL. A negative result does not preclude SARS-Cov-2 infection and should not be used as the sole basis for treatment or other patient management decisions. A negative result may occur with  improper specimen collection/handling, submission of specimen other than nasopharyngeal swab, presence of viral mutation(s) within the areas targeted by this assay, and inadequate number of viral copies(<138 copies/mL). A negative result must be combined with clinical observations, patient history,  and epidemiological information. The expected result is Negative.  Fact Sheet for Patients:  EntrepreneurPulse.com.au  Fact Sheet for Healthcare Providers:  IncredibleEmployment.be  This test is no t yet approved or cleared by the [place] FDA and  has been authorized for detection and/or diagnosis of SARS-CoV-2 by FDA under an Emergency Use Authorization (EUA). This EUA will remain  in effect (meaning this test can be used) for the duration of the COVID-19 declaration under Section 564(b)(1) of the Act, 21 U.S.C.section 360bbb-3(b)(1), unless the authorization is terminated  or revoked sooner.       Influenza A by PCR NEGATIVE NEGATIVE Final   Influenza B by PCR NEGATIVE NEGATIVE Final    Comment: (NOTE) The Xpert Xpress SARS-CoV-2/FLU/RSV plus assay is intended as an aid in the diagnosis of influenza from Nasopharyngeal swab specimens and should not be used as a sole basis for treatment. Nasal washings and aspirates are unacceptable for Xpert Xpress SARS-CoV-2/FLU/RSV testing.  Fact Sheet for Patients: EntrepreneurPulse.com.au  Fact Sheet for Healthcare Providers: IncredibleEmployment.be  This test is not yet approved or cleared by the [place] FDA and has been authorized for detection and/or diagnosis of SARS-CoV-2 by FDA under an Emergency Use Authorization (EUA). This EUA will remain in effect (meaning this test can be used) for the duration of the COVID-19 declaration under Section 564(b)(1) of the Act, 21 U.S.C. section 360bbb-3(b)(1), unless the authorization is terminated or revoked.  Performed at [facility], [place], [place] [address]   C Difficile Quick Screen w PCR reflex     Status: Abnormal   Collection Time: 06/23/21 11:30 PM   Specimen: STOOL  Result Value Ref Range Status   C Diff antigen POSITIVE (A) NEGATIVE Final   C Diff toxin POSITIVE (A)  NEGATIVE Final   C Diff interpretation Toxin producing C. difficile detected.  Final    Comment: CRITICAL RESULT CALLED TO, READ BACK BY AND VERIFIED WITH: DENNIS [facility] AT 0211 ON 06/24/21 BY SS Performed at [facility], [address] [place],  [address]   Gastrointestinal Panel by PCR , Stool     Status: None   Collection Time: 06/23/21 11:30 PM   Specimen: Stool  Result Value Ref Range  Status   Campylobacter species NOT DETECTED NOT DETECTED Final   Plesimonas shigelloides NOT DETECTED NOT DETECTED Final   Salmonella species NOT DETECTED NOT DETECTED Final   Yersinia enterocolitica NOT DETECTED NOT DETECTED Final   Vibrio species NOT DETECTED NOT DETECTED Final   Vibrio cholerae NOT DETECTED NOT DETECTED Final   Enteroaggregative E coli (EAEC) NOT DETECTED NOT DETECTED Final   Enteropathogenic E coli (EPEC) NOT DETECTED NOT DETECTED Final   Enterotoxigenic E coli (ETEC) NOT DETECTED NOT DETECTED Final   Shiga like toxin producing E coli (STEC) NOT DETECTED NOT DETECTED Final   Shigella/Enteroinvasive E coli (EIEC) NOT DETECTED NOT DETECTED Final   Cryptosporidium NOT DETECTED NOT DETECTED Final   Cyclospora cayetanensis NOT DETECTED NOT DETECTED Final   Entamoeba histolytica NOT DETECTED NOT DETECTED Final   Giardia lamblia NOT DETECTED NOT DETECTED Final   Adenovirus F40/41 NOT DETECTED NOT DETECTED Final   Astrovirus NOT DETECTED NOT DETECTED Final   Norovirus GI/GII NOT DETECTED NOT DETECTED Final   Rotavirus A NOT DETECTED NOT DETECTED Final   Sapovirus (I, II, IV, and V) NOT DETECTED NOT DETECTED Final    Comment: Performed at [facility], [address] [place], [place] [address]         Radiology Studies: No results found.      Scheduled Meds:  fidaxomicin  200 mg Oral BID   finasteride  5 mg Oral Daily   metroNIDAZOLE  500 mg Oral Q8H   midodrine  2.5 mg Oral TID WC   sertraline  50 mg Oral Daily   Continuous Infusions:   sodium chloride       LOS: 4 days    Time spent: 35 minutes with more than 50% on COC    Nolberto Hanlon, MD [facility] Pager [phone]  If 7PM-7AM, please contact night-coverage 06/27/2021, 3:05 PM

## 2021-06-27 NOTE — Care Management Important Message (Signed)
Important Message  Patient Details  Name: MAREON VIVIANO MRN: [id number] Date of Birth: October 26, 1934   Medicare Important Message Given:  Yes  I talked with the patient's MATEUSZ, ROLLS, wife [phone]) and reviewed the Important Message from Medicare with her.  She is in agreement with the upcoming discharge.  I asked if she would like a copy and she replied yes.  I have sent a copy via secure e-mail to: [email or web address] as directed. I thanked her for her time.   Juliann Pulse A Mell Mellott 06/27/2021, 3:04 PM

## 2021-06-28 DIAGNOSIS — K529 Noninfective gastroenteritis and colitis, unspecified: Secondary | ICD-10-CM | POA: Diagnosis not present

## 2021-06-28 LAB — CULTURE, BLOOD (ROUTINE X 2)
Culture: NO GROWTH
Culture: NO GROWTH
Special Requests: ADEQUATE

## 2021-06-28 LAB — CREATININE, SERUM
Creatinine, Ser: 1.24 mg/dL (ref 0.61–1.24)
GFR, Estimated: 57 mL/min — ABNORMAL LOW (ref >60)

## 2021-06-28 MED ORDER — RISAQUAD PO CAPS: 2.0000 | ORAL_CAPSULE | Freq: Three times a day (TID) | ORAL | 0 refills | Status: AC

## 2021-06-28 MED ORDER — MIDODRINE HCL 5 MG PO TABS: 5.0000 mg | ORAL_TABLET | Freq: Three times a day (TID) | ORAL | 0 refills | Status: DC

## 2021-06-28 MED ORDER — RISAQUAD PO CAPS
2.0000 | ORAL_CAPSULE | Freq: Three times a day (TID) | ORAL | Status: DC
  Administered 2021-06-28: 2 via ORAL
  Filled 2021-06-28: qty 2

## 2021-06-28 MED ORDER — MIDODRINE HCL 5 MG PO TABS
5.0000 mg | ORAL_TABLET | Freq: Three times a day (TID) | ORAL | Status: DC
  Administered 2021-06-28: 5 mg via ORAL
  Filled 2021-06-28: qty 1

## 2021-06-28 MED ORDER — METRONIDAZOLE 500 MG PO TABS: 500.0000 mg | ORAL_TABLET | Freq: Three times a day (TID) | ORAL | 0 refills | Status: AC

## 2021-06-28 NOTE — Discharge Summary (Signed)
Dylan [name] [id number] DOB: 1934-09-25 DOA: 06/23/2021  PCP: [name], MD  Admit date: 06/23/2021 Discharge date: 06/28/2021  Admitted From: Home Disposition: Home  Recommendations for Outpatient Follow-up:  Follow up with PCP in 1 week Please obtain BMP/CBC in one week   Home Health: Yes   Discharge Condition:Stable CODE STATUS: Full Diet recommendation: Heart Healthy  Brief/Interim Summary: Per HPI:87 year old white male with a history of dementia, CKD stage III, depression presents to the ER today with recurrent diarrhea.  Patient was in the hospital 2 weeks ago.  He was diagnosed with C. difficile colitis.  Patient completed 10 days of oral vancomycin at home.  Wife states that his last dose was last Saturday.  Patient has been doing better over the last 7 days.  Today, the wife found the patient in the bedroom with the bed covered in liquid stool.  Patient also had defecated on the floor.  She got the patient up to the bathroom where he had another diarrheal bowel movement in the toilet.  She got him into the shower.  While she was cleaning them off, the patient had a presyncopal episode.  She states the patient became very pale white.  He did not lose consciousness however.  After several minutes, the patient came to.  She brought him to the ER.In the ER, CT scan demonstrated colitis, please see full result.  White count was elevated 17.  Patient was admitted to the hospital.   Acute C. difficile colitis, first recurrence Sepsis secondary to above Sepsis physiology improved Diarrhea has decreased only to yesterday afternoon.  Now starting to be more softer informed.   Stable to be discharged home to continue Dificid and metronidazole to complete total 10-day course  Patient and family were told as long as he is having diarrhea he is contagious and will need to have contact precautions at home.   Continue hydration at home     Acute on chronic kidney disease stage  IIIa Likely prerenal due to diarrhea Improved with IV fluids    History of orthostatic hypotension Continue midodrine   Dementia with behavioral disturbance Stable monitor   Bilateral hearing loss Stable, monitor   Spermatocele of epididymis As needed Tylenol     Discharge Diagnoses:  Principal Problem:   Acute colitis - suspected c. diff colitis present on admission Active Problems:   Spermatocele of epididymis, single   Dementia with behavioral disturbance (HCC)   Bilateral hearing loss   CKD (chronic kidney disease) stage 3, GFR 30-59 ml/min (HCC)   Sepsis with acute organ dysfunction (HCC)   Orthostatic hypotension   Leukocytosis    Discharge Instructions  Discharge Instructions     Call MD for:  severe uncontrolled pain   Complete by: As directed    Call MD for:  temperature >100.4   Complete by: As directed    Diet - low sodium heart healthy   Complete by: As directed    Discharge instructions   Complete by: As directed    As long as he is having diarrhea, he is contagious and needs to have contact precautions. F/u with pcp within one week , needs labs Hydrate with water   Increase activity slowly   Complete by: As directed       Allergies as of 06/28/2021       Reactions   Doxepin Other (See Comments)   Lethargy, overly sedating at low dose   Lincomycin Hcl Rash  Medication List     STOP taking these medications    polyethylene glycol 17 g packet Commonly known as: MIRALAX / GLYCOLAX   senna 8.6 MG Tabs tablet Commonly known as: SENOKOT       TAKE these medications    acidophilus Caps capsule Take 2 capsules by mouth 3 (three) times daily for 10 days.   Dificid 200 MG Tabs tablet Generic drug: fidaxomicin Take 1 tablet (200 mg total) by mouth 2 (two) times daily for 8 days.   feeding supplement Liqd Take 237 mLs by mouth 2 (two) times daily between meals.   finasteride 5 MG tablet Commonly known as: PROSCAR Take 1  tablet (5 mg total) by mouth daily.   memantine 14 MG Cp24 24 hr capsule Commonly known as: NAMENDA XR Take 1 capsule (14 mg total) by mouth daily.   metroNIDAZOLE 500 MG tablet Commonly known as: FLAGYL Take 1 tablet (500 mg total) by mouth every 8 (eight) hours for 8 days.   midodrine 5 MG tablet Commonly known as: PROAMATINE Take 1 tablet (5 mg total) by mouth 3 (three) times daily with meals. What changed:  medication strength how much to take   sertraline 50 MG tablet Commonly known as: ZOLOFT Take 1 tablet (50 mg total) by mouth daily.   vitamin B-12 500 MCG tablet Commonly known as: V-R VITAMIN B-12 Take 1 tablet (500 mcg total) by mouth daily.   Vitamin D3 25 MCG (1000 UT) Caps Take 1 capsule (1,000 Units total) by mouth daily.        Follow-up Information     [name], MD Follow up in 1 week(s).   Specialty: Family Medicine Why: f/u labs. Contact information: [place] [address] [phone]                Allergies  Allergen Reactions   Doxepin Other (See Comments)    Lethargy, overly sedating at low dose   Lincomycin Hcl Rash    Consultations:    Procedures/Studies: CT ABDOMEN PELVIS WO CONTRAST  Result Date: 06/23/2021 CLINICAL DATA:  Syncope, hypotension, diarrhea. EXAM: CT ABDOMEN AND PELVIS WITHOUT CONTRAST TECHNIQUE: Multidetector CT imaging of the abdomen and pelvis was performed following the standard protocol without IV contrast. COMPARISON:  CT abdomen pelvis dated 05/06/2021. FINDINGS: Lower chest: No acute abnormality. Hepatobiliary: No focal liver abnormality is seen. No gallstones, gallbladder wall thickening, or biliary dilatation. Pancreas: Unremarkable. No pancreatic ductal dilatation or surrounding inflammatory changes. Spleen: Normal in size without focal abnormality. Adrenals/Urinary Tract: Adrenal glands are unremarkable. Kidneys are normal, without renal calculi, focal lesion, or  hydronephrosis. Bladder is incompletely distended, however the urinary bladder wall appears thickened, measuring up to 6 mm. Stomach/Bowel: Stomach is within normal limits. There is circumferential bowel wall thickening of the rectum and sigmoid colon without significant surrounding fat stranding. The appendix appears normal. No evidence of bowel obstruction. Vascular/Lymphatic: Aortic atherosclerosis. No enlarged abdominal or pelvic lymph nodes. Reproductive: Prostate is unremarkable. Other: No abdominal wall hernia or abnormality. No abdominopelvic ascites. Musculoskeletal: Degenerative changes are seen in the spine. IMPRESSION: 1. Circumferential bowel wall thickening of the rectum and sigmoid is nonspecific and may represent an infectious, inflammatory, or ischemic process. 2. The urinary bladder is incompletely distended, however the bladder wall appears thickened and may reflect cystitis. Electronically Signed   By: Zerita Boers M.D.   On: 06/23/2021 12:17   CT HEAD WO CONTRAST  Result Date: 06/23/2021 CLINICAL DATA:  Syncope, dementia.  EXAM: CT HEAD WITHOUT CONTRAST TECHNIQUE: Contiguous axial images were obtained from the base of the skull through the vertex without intravenous contrast. COMPARISON:  CT head dated 05/04/2021. FINDINGS: Brain: No evidence of acute infarction, hemorrhage, hydrocephalus, extra-axial collection or mass lesion/mass effect. There is mild cerebral volume loss with associated ex vacuo dilatation. Periventricular white matter hypoattenuation likely represents chronic small vessel ischemic disease. Vascular: There are vascular calcifications in the carotid siphons. Skull: Normal. Negative for fracture or focal lesion. Sinuses/Orbits: A lacrimal bypass tube (Jones tube) is noted on the right. Other: None. IMPRESSION: No acute intracranial process. Electronically Signed   By: Zerita Boers M.D.   On: 06/23/2021 12:05   CUP PACEART REMOTE DEVICE CHECK  Result Date:  06/08/2021 ILR summary report received. Battery status OK. Normal device function. No new symptom,, or pause episodes. 3 new AF episodes. Available EGM shows SR with ectopy.  2 16sec brady events, HR 31-45, with ectopy. Presenting rhythm bigeminal PVC's. Monthly summary reports and ROV/PRN LR  US SCROTUM W/DOPPLER  Result Date: 06/04/2021 CLINICAL DATA:  Left testicular pain EXAM: SCROTAL ULTRASOUND DOPPLER ULTRASOUND OF THE TESTICLES TECHNIQUE: Complete ultrasound examination of the testicles, epididymis, and other scrotal structures was performed. Color and spectral Doppler ultrasound were also utilized to evaluate blood flow to the testicles. COMPARISON:  None. FINDINGS: Right testicle Measurements: 4.3 x 2.3 x 3.2 cm. There are a few tiny scattered intraparenchymal cysts measuring 2 mm or smaller. No solid mass or microlithiasis visualized. Left testicle Measurements: 3.7 x 2.8 x 3.1 cm. No mass or microlithiasis visualized. Right epididymis: Multiple simple right epididymal head cysts, the largest measures up to 1.2 cm. Otherwise, the right epididymis is within normal limits. Left epididymis: Large complex cystic structure with low level internal echoes in the region of the left epididymis measuring approximately 6.9 x 6.1 x 5.6 cm. Hydrocele:  Small right hydrocele. Varicocele:  Mild prominence of the venous plexus bilaterally. Pulsed Doppler interrogation of both testes demonstrates normal low resistance arterial and venous waveforms bilaterally. IMPRESSION: 1. Negative for testicular torsion or intratesticular mass. 2. Large complex left extratesticular cyst with low level internal echoes measuring up to 6.9 cm, likely reflecting a spermatocele. 3. Multiple simple appearing right epididymal head cysts. 4. Small right hydrocele. 5. Mild bilateral varicoceles. Electronically Signed   By: Davina Poke D.O.   On: 06/04/2021 15:40      Subjective: Feels better.  No shortness of breath, dizziness or  lightheadedness or abdominal pain  Discharge Exam: Vitals:   06/28/21 0422 06/28/21 0735  BP: 119/67 118/67  Pulse: (!) 53 (!) 52  Resp: 18 15  Temp: 98.5 F (36.9 C) 98.5 F (36.9 C)  SpO2: 96% 94%   Vitals:   06/27/21 1523 06/27/21 2028 06/28/21 0422 06/28/21 0735  BP: 105/61 102/68 119/67 118/67  Pulse: 65 (!) 57 (!) 53 (!) 52  Resp: '15 20 18 15  '$ Temp: 98.2 F (36.8 C) 97.7 F (36.5 C) 98.5 F (36.9 C) 98.5 F (36.9 C)  TempSrc:  Oral Oral   SpO2: 95% 97% 96% 94%  Weight:      Height:        General: Pt is alert, awake, not in acute distress Cardiovascular: RRR, S1/S2 +, no rubs, no gallops Respiratory: CTA bilaterally, no wheezing, no rhonchi Abdominal: Soft, NT, ND, bowel sounds + Extremities: no edema    The results of significant diagnostics from this hospitalization (including imaging, microbiology, ancillary and laboratory) are listed below for reference.  Microbiology: Recent Results (from the past 240 hour(s))  Blood culture (routine x 2)     Status: None   Collection Time: 06/23/21 11:50 AM   Specimen: BLOOD  Result Value Ref Range Status   Specimen Description BLOOD BLOOD RIGHT WRIST  Final   Special Requests   Final    BOTTLES DRAWN AEROBIC AND ANAEROBIC Blood Culture results may not be optimal due to an inadequate volume of blood received in culture bottles   Culture   Final    NO GROWTH 5 DAYS Performed at [facility], [address] [place], [place] [address]    Report Status 06/28/2021 FINAL  Final  Blood culture (routine x 2)     Status: None   Collection Time: 06/23/21 12:00 PM   Specimen: BLOOD  Result Value Ref Range Status   Specimen Description BLOOD LEFT ANTECUBITAL  Final   Special Requests   Final    BOTTLES DRAWN AEROBIC AND ANAEROBIC Blood Culture adequate volume   Culture   Final    NO GROWTH 5 DAYS Performed at [facility], [address] [place], [place] [address]    Report Status 06/28/2021  FINAL  Final  Resp Panel by RT-PCR (Flu A&B, Covid) Nasopharyngeal Swab     Status: None   Collection Time: 06/23/21 12:00 PM   Specimen: Nasopharyngeal Swab; Nasopharyngeal(NP) swabs in vial transport medium  Result Value Ref Range Status   SARS Coronavirus 2 by RT PCR NEGATIVE NEGATIVE Final    Comment: (NOTE) SARS-CoV-2 target nucleic acids are NOT DETECTED.  The SARS-CoV-2 RNA is generally detectable in upper respiratory specimens during the acute phase of infection. The lowest concentration of SARS-CoV-2 viral copies this assay can detect is 138 copies/mL. A negative result does not preclude SARS-Cov-2 infection and should not be used as the sole basis for treatment or other patient management decisions. A negative result may occur with  improper specimen collection/handling, submission of specimen other than nasopharyngeal swab, presence of viral mutation(s) within the areas targeted by this assay, and inadequate number of viral copies(<138 copies/mL). A negative result must be combined with clinical observations, patient history, and epidemiological information. The expected result is Negative.  Fact Sheet for Patients:  EntrepreneurPulse.com.au  Fact Sheet for Healthcare Providers:  IncredibleEmployment.be  This test is no t yet approved or cleared by the [place] FDA and  has been authorized for detection and/or diagnosis of SARS-CoV-2 by FDA under an Emergency Use Authorization (EUA). This EUA will remain  in effect (meaning this test can be used) for the duration of the COVID-19 declaration under Section 564(b)(1) of the Act, 21 U.S.C.section 360bbb-3(b)(1), unless the authorization is terminated  or revoked sooner.       Influenza A by PCR NEGATIVE NEGATIVE Final   Influenza B by PCR NEGATIVE NEGATIVE Final    Comment: (NOTE) The Xpert Xpress SARS-CoV-2/FLU/RSV plus assay is intended as an aid in the diagnosis of influenza  from Nasopharyngeal swab specimens and should not be used as a sole basis for treatment. Nasal washings and aspirates are unacceptable for Xpert Xpress SARS-CoV-2/FLU/RSV testing.  Fact Sheet for Patients: EntrepreneurPulse.com.au  Fact Sheet for Healthcare Providers: IncredibleEmployment.be  This test is not yet approved or cleared by the [place] FDA and has been authorized for detection and/or diagnosis of SARS-CoV-2 by FDA under an Emergency Use Authorization (EUA). This EUA will remain in effect (meaning this test can be used) for the duration of the COVID-19 declaration under  Section 564(b)(1) of the Act, 21 U.S.C. section 360bbb-3(b)(1), unless the authorization is terminated or revoked.  Performed at [facility], [place], [place] [address]   C Difficile Quick Screen w PCR reflex     Status: Abnormal   Collection Time: 06/23/21 11:30 PM   Specimen: STOOL  Result Value Ref Range Status   C Diff antigen POSITIVE (A) NEGATIVE Final   C Diff toxin POSITIVE (A) NEGATIVE Final   C Diff interpretation Toxin producing C. difficile detected.  Final    Comment: CRITICAL RESULT CALLED TO, READ BACK BY AND VERIFIED WITH: DENNIS [facility] AT 0211 ON 06/24/21 BY SS Performed at [facility], [address] [place], [place] [address]   Gastrointestinal Panel by PCR , Stool     Status: None   Collection Time: 06/23/21 11:30 PM   Specimen: Stool  Result Value Ref Range Status   Campylobacter species NOT DETECTED NOT DETECTED Final   Plesimonas shigelloides NOT DETECTED NOT DETECTED Final   Salmonella species NOT DETECTED NOT DETECTED Final   Yersinia enterocolitica NOT DETECTED NOT DETECTED Final   Vibrio species NOT DETECTED NOT DETECTED Final   Vibrio cholerae NOT DETECTED NOT DETECTED Final   Enteroaggregative E coli (EAEC) NOT DETECTED NOT DETECTED Final   Enteropathogenic E coli (EPEC) NOT DETECTED NOT  DETECTED Final   Enterotoxigenic E coli (ETEC) NOT DETECTED NOT DETECTED Final   Shiga like toxin producing E coli (STEC) NOT DETECTED NOT DETECTED Final   Shigella/Enteroinvasive E coli (EIEC) NOT DETECTED NOT DETECTED Final   Cryptosporidium NOT DETECTED NOT DETECTED Final   Cyclospora cayetanensis NOT DETECTED NOT DETECTED Final   Entamoeba histolytica NOT DETECTED NOT DETECTED Final   Giardia lamblia NOT DETECTED NOT DETECTED Final   Adenovirus F40/41 NOT DETECTED NOT DETECTED Final   Astrovirus NOT DETECTED NOT DETECTED Final   Norovirus GI/GII NOT DETECTED NOT DETECTED Final   Rotavirus A NOT DETECTED NOT DETECTED Final   Sapovirus (I, II, IV, and V) NOT DETECTED NOT DETECTED Final    Comment: Performed at [facility], [address] [place], [place] [address]     Labs: BNP (last 3 results) No results for input(s): BNP in the last 8760 hours. Basic Metabolic Panel: Recent Labs  Lab 06/23/21 1023 06/24/21 0439 06/27/21 0854 06/28/21 0937  NA 140 137 139  --   K 4.6 3.8 3.9  --   CL 101 108 104  --   CO2 '26 26 27  '$ --   GLUCOSE 165* 93 99  --   BUN '16 13 15  '$ --   CREATININE 1.54* 1.22 1.32* 1.24  CALCIUM 9.3 8.0* 8.8*  --    Liver Function Tests: Recent Labs  Lab 06/24/21 0439  AST 17  ALT 18  ALKPHOS 51  BILITOT 1.2  PROT 5.4*  ALBUMIN 3.0*   No results for input(s): LIPASE, AMYLASE in the last 168 hours. No results for input(s): AMMONIA in the last 168 hours. CBC: Recent Labs  Lab 06/23/21 1023 06/24/21 0439  WBC 17.1* 8.1  NEUTROABS  --  5.6  HGB 16.1 13.0  HCT 46.9 36.4*  MCV 95.3 92.2  PLT 123* 93*   Cardiac Enzymes: No results for input(s): CKTOTAL, CKMB, CKMBINDEX, TROPONINI in the last 168 hours. BNP: Invalid input(s): POCBNP CBG: No results for input(s): GLUCAP in the last 168 hours. D-Dimer No results for input(s): DDIMER in the last 72 hours. Hgb A1c No results for input(s):  HGBA1C in the last 72 hours. Lipid Profile No  results for input(s): CHOL, HDL, LDLCALC, TRIG, CHOLHDL, LDLDIRECT in the last 72 hours. Thyroid function studies No results for input(s): TSH, T4TOTAL, T3FREE, THYROIDAB in the last 72 hours.  Invalid input(s): FREET3 Anemia work up No results for input(s): VITAMINB12, FOLATE, FERRITIN, TIBC, IRON, RETICCTPCT in the last 72 hours. Urinalysis    Component Value Date/Time   COLORURINE YELLOW (A) 06/23/2021 2100   APPEARANCEUR CLEAR (A) 06/23/2021 2100   LABSPEC 1.023 06/23/2021 2100   PHURINE 6.0 06/23/2021 2100   GLUCOSEU NEGATIVE 06/23/2021 2100   HGBUR NEGATIVE 06/23/2021 2100   BILIRUBINUR NEGATIVE 06/23/2021 2100   BILIRUBINUR 1+ 06/05/2021 1635   KETONESUR NEGATIVE 06/23/2021 2100   PROTEINUR NEGATIVE 06/23/2021 2100   UROBILINOGEN 0.2 06/05/2021 1635   NITRITE NEGATIVE 06/23/2021 2100   LEUKOCYTESUR NEGATIVE 06/23/2021 2100   Sepsis Labs Invalid input(s): PROCALCITONIN,  WBC,  LACTICIDVEN Microbiology Recent Results (from the past 240 hour(s))  Blood culture (routine x 2)     Status: None   Collection Time: 06/23/21 11:50 AM   Specimen: BLOOD  Result Value Ref Range Status   Specimen Description BLOOD BLOOD RIGHT WRIST  Final   Special Requests   Final    BOTTLES DRAWN AEROBIC AND ANAEROBIC Blood Culture results may not be optimal due to an inadequate volume of blood received in culture bottles   Culture   Final    NO GROWTH 5 DAYS Performed at [facility], [address] [place], [place] [address]    Report Status 06/28/2021 FINAL  Final  Blood culture (routine x 2)     Status: None   Collection Time: 06/23/21 12:00 PM   Specimen: BLOOD  Result Value Ref Range Status   Specimen Description BLOOD LEFT ANTECUBITAL  Final   Special Requests   Final    BOTTLES DRAWN AEROBIC AND ANAEROBIC Blood Culture adequate volume   Culture   Final    NO GROWTH 5 DAYS Performed at [facility], [address] [place], [place] [address]    Report Status  06/28/2021 FINAL  Final  Resp Panel by RT-PCR (Flu A&B, Covid) Nasopharyngeal Swab     Status: None   Collection Time: 06/23/21 12:00 PM   Specimen: Nasopharyngeal Swab; Nasopharyngeal(NP) swabs in vial transport medium  Result Value Ref Range Status   SARS Coronavirus 2 by RT PCR NEGATIVE NEGATIVE Final    Comment: (NOTE) SARS-CoV-2 target nucleic acids are NOT DETECTED.  The SARS-CoV-2 RNA is generally detectable in upper respiratory specimens during the acute phase of infection. The lowest concentration of SARS-CoV-2 viral copies this assay can detect is 138 copies/mL. A negative result does not preclude SARS-Cov-2 infection and should not be used as the sole basis for treatment or other patient management decisions. A negative result may occur with  improper specimen collection/handling, submission of specimen other than nasopharyngeal swab, presence of viral mutation(s) within the areas targeted by this assay, and inadequate number of viral copies(<138 copies/mL). A negative result must be combined with clinical observations, patient history, and epidemiological information. The expected result is Negative.  Fact Sheet for Patients:  EntrepreneurPulse.com.au  Fact Sheet for Healthcare Providers:  IncredibleEmployment.be  This test is no t yet approved or cleared by the [place] FDA and  has been authorized for detection and/or diagnosis of SARS-CoV-2 by FDA under an Emergency Use Authorization (EUA). This EUA will remain  in effect (meaning this test can be used)  for the duration of the COVID-19 declaration under Section 564(b)(1) of the Act, 21 U.S.C.section 360bbb-3(b)(1), unless the authorization is terminated  or revoked sooner.       Influenza A by PCR NEGATIVE NEGATIVE Final   Influenza B by PCR NEGATIVE NEGATIVE Final    Comment: (NOTE) The Xpert Xpress SARS-CoV-2/FLU/RSV plus assay is intended as an aid in the diagnosis of  influenza from Nasopharyngeal swab specimens and should not be used as a sole basis for treatment. Nasal washings and aspirates are unacceptable for Xpert Xpress SARS-CoV-2/FLU/RSV testing.  Fact Sheet for Patients: EntrepreneurPulse.com.au  Fact Sheet for Healthcare Providers: IncredibleEmployment.be  This test is not yet approved or cleared by the [place] FDA and has been authorized for detection and/or diagnosis of SARS-CoV-2 by FDA under an Emergency Use Authorization (EUA). This EUA will remain in effect (meaning this test can be used) for the duration of the COVID-19 declaration under Section 564(b)(1) of the Act, 21 U.S.C. section 360bbb-3(b)(1), unless the authorization is terminated or revoked.  Performed at [facility], [place], [place] [address]   C Difficile Quick Screen w PCR reflex     Status: Abnormal   Collection Time: 06/23/21 11:30 PM   Specimen: STOOL  Result Value Ref Range Status   C Diff antigen POSITIVE (A) NEGATIVE Final   C Diff toxin POSITIVE (A) NEGATIVE Final   C Diff interpretation Toxin producing C. difficile detected.  Final    Comment: CRITICAL RESULT CALLED TO, READ BACK BY AND VERIFIED WITH: DENNIS [facility] AT 0211 ON 06/24/21 BY SS Performed at [facility], [address] [place], [place] [address]   Gastrointestinal Panel by PCR , Stool     Status: None   Collection Time: 06/23/21 11:30 PM   Specimen: Stool  Result Value Ref Range Status   Campylobacter species NOT DETECTED NOT DETECTED Final   Plesimonas shigelloides NOT DETECTED NOT DETECTED Final   Salmonella species NOT DETECTED NOT DETECTED Final   Yersinia enterocolitica NOT DETECTED NOT DETECTED Final   Vibrio species NOT DETECTED NOT DETECTED Final   Vibrio cholerae NOT DETECTED NOT DETECTED Final   Enteroaggregative E coli (EAEC) NOT DETECTED NOT DETECTED Final   Enteropathogenic E coli (EPEC) NOT  DETECTED NOT DETECTED Final   Enterotoxigenic E coli (ETEC) NOT DETECTED NOT DETECTED Final   Shiga like toxin producing E coli (STEC) NOT DETECTED NOT DETECTED Final   Shigella/Enteroinvasive E coli (EIEC) NOT DETECTED NOT DETECTED Final   Cryptosporidium NOT DETECTED NOT DETECTED Final   Cyclospora cayetanensis NOT DETECTED NOT DETECTED Final   Entamoeba histolytica NOT DETECTED NOT DETECTED Final   Giardia lamblia NOT DETECTED NOT DETECTED Final   Adenovirus F40/41 NOT DETECTED NOT DETECTED Final   Astrovirus NOT DETECTED NOT DETECTED Final   Norovirus GI/GII NOT DETECTED NOT DETECTED Final   Rotavirus A NOT DETECTED NOT DETECTED Final   Sapovirus (I, II, IV, and V) NOT DETECTED NOT DETECTED Final    Comment: Performed at [facility], [address], [place], [place] [address]     Time coordinating discharge: Over 30 minutes  SIGNED:   Nolberto Hanlon, MD  [facility] 06/28/2021, 10:34 AM Pager   If 7PM-7AM, please contact night-coverage [email or web address] Password [email or web address]

## 2021-06-28 NOTE — Progress Notes (Signed)
Patient is A & O to self only. Wife, [name], at bedside. AVS reviewed with wife who verbalized understanding via teach back re medications, signs and symptoms to notify MD, follow up appointments as well as limitations and restrictions. Dificid has been delivered to patient room and given to wife from pharmacy. Acidophilus, metronidazole and midodrine have been called into [id number], wife is aware. Patient will transport home via private vehicle with wife [name].

## 2021-06-28 NOTE — Plan of Care (Signed)

## 2021-07-03 ENCOUNTER — Other Ambulatory Visit: Payer: Self-pay

## 2021-07-03 ENCOUNTER — Ambulatory Visit (INDEPENDENT_AMBULATORY_CARE_PROVIDER_SITE_OTHER): Payer: Medicare Other | Admitting: Family Medicine

## 2021-07-03 ENCOUNTER — Encounter: Payer: Self-pay | Admitting: Family Medicine

## 2021-07-03 VITALS — BP 132/84 | HR 66 | Temp 97.2°F | Ht 73.0 in | Wt 182.0 lb

## 2021-07-03 DIAGNOSIS — N183 Chronic kidney disease, stage 3 unspecified: Secondary | ICD-10-CM

## 2021-07-03 DIAGNOSIS — F02818 Dementia in other diseases classified elsewhere, unspecified severity, with other behavioral disturbance: Secondary | ICD-10-CM

## 2021-07-03 DIAGNOSIS — G301 Alzheimer's disease with late onset: Secondary | ICD-10-CM | POA: Diagnosis not present

## 2021-07-03 DIAGNOSIS — R1310 Dysphagia, unspecified: Secondary | ICD-10-CM

## 2021-07-03 DIAGNOSIS — A0472 Enterocolitis due to Clostridium difficile, not specified as recurrent: Secondary | ICD-10-CM | POA: Diagnosis not present

## 2021-07-03 DIAGNOSIS — I951 Orthostatic hypotension: Secondary | ICD-10-CM

## 2021-07-03 DIAGNOSIS — F0281 Dementia in other diseases classified elsewhere with behavioral disturbance: Secondary | ICD-10-CM

## 2021-07-03 LAB — CBC WITH DIFFERENTIAL/PLATELET
Basophils Absolute: 0.1 10*3/uL (ref 0.0–0.1)
Basophils Relative: 1 % (ref 0.0–3.0)
Eosinophils Absolute: 0.2 10*3/uL (ref 0.0–0.7)
Eosinophils Relative: 3.1 % (ref 0.0–5.0)
HCT: 44 % (ref 39.0–52.0)
Hemoglobin: 15 g/dL (ref 13.0–17.0)
Lymphocytes Relative: 20.4 % (ref 12.0–46.0)
Lymphs Abs: 1.2 10*3/uL (ref 0.7–4.0)
MCHC: 34.1 g/dL (ref 30.0–36.0)
MCV: 95 fl (ref 78.0–100.0)
Monocytes Absolute: 0.8 10*3/uL (ref 0.1–1.0)
Monocytes Relative: 14.3 % — ABNORMAL HIGH (ref 3.0–12.0)
Neutro Abs: 3.5 10*3/uL (ref 1.4–7.7)
Neutrophils Relative %: 61.2 % (ref 43.0–77.0)
Platelets: 121 10*3/uL — ABNORMAL LOW (ref 150.0–400.0)
RBC: 4.63 Mil/uL (ref 4.22–5.81)
RDW: 13.8 % (ref 11.5–15.5)
WBC: 5.8 10*3/uL (ref 4.0–10.5)

## 2021-07-03 LAB — BASIC METABOLIC PANEL
BUN: 19 mg/dL (ref 6–23)
CO2: 30 mEq/L (ref 19–32)
Calcium: 9.4 mg/dL (ref 8.4–10.5)
Chloride: 101 mEq/L (ref 96–112)
Creatinine, Ser: 1.26 mg/dL (ref 0.40–1.50)
GFR: 52.05 mL/min — ABNORMAL LOW (ref >60.00)
Glucose, Bld: 88 mg/dL (ref 70–99)
Potassium: 4.2 mEq/L (ref 3.5–5.1)
Sodium: 139 mEq/L (ref 135–145)

## 2021-07-03 MED ORDER — NYSTATIN 100000 UNIT/ML MT SUSP: 5.0000 mL | Freq: Three times a day (TID) | OROMUCOSAL | 0 refills | Status: DC

## 2021-07-03 NOTE — Progress Notes (Signed)
Patient ID: Dylan [name], male    DOB: June 01, 1934, 87 y.o.   MRN: [id number]  This visit was conducted in person.  BP 132/84   Pulse 66   Temp (!) 97.2 F (36.2 C) (Temporal)   Ht '6\' 1"'$  (1.854 m)   Wt 182 lb (82.6 kg)   SpO2 98%   BMI 24.01 kg/m    CC: hosp f/u visit  Subjective:   HPI: Dylan [name] is a 87 y.o. male presenting on 07/03/2021 for Hospitalization Follow-up (Syncope/)   Recent hospitalization for recurrent C diff diarrhea and associated sepsis after completing first outpatient course of oral vancomycin complicated by presyncopal event while getting cleaned in the shower. Discharged on flagyl and dificid 10d course. Also on acidophilus.   C diff testing in hospital returned positive - however in setting of recent positive test.   Midway through abx course. Stools are loose still but no longer watery, foul odor has improved, no longer having bowel accidents.  Struggling with staying hydrated.  Noticing some solid and liquid dysphagia. Worse over the last few weeks, even prior to hospitalization.   Dementia complicates care. Wife gives all of history.    Admit date: 06/23/2021 Discharge date: 06/28/2021 TCM hosp f/u phone call not completed - patient seen within 3 business days of discharge.   Recommendations for Outpatient Follow-up:  Follow up with PCP in 1 week Please obtain BMP/CBC in one week  Discharge Diagnoses:  Principal Problem:   Acute colitis - suspected c. diff colitis present on admission Active Problems:   Spermatocele of epididymis, single   Dementia with behavioral disturbance (HCC)   Bilateral hearing loss   CKD (chronic kidney disease) stage 3, GFR 30-59 ml/min (HCC)   Sepsis with acute organ dysfunction (HCC)   Orthostatic hypotension   Leukocytosis  Home Health: Yes - this needs to be scheduled.  Discharge Condition:Stable CODE STATUS: Full Diet recommendation: Heart Healthy      Relevant past medical, surgical, family and  social history reviewed and updated as indicated. Interim medical history since our last visit reviewed. Allergies and medications reviewed and updated. Outpatient Medications Prior to Visit  Medication Sig Dispense Refill   acidophilus (RISAQUAD) CAPS capsule Take 2 capsules by mouth 3 (three) times daily for 10 days. 60 capsule 0   Cholecalciferol (VITAMIN D3) 1000 units CAPS Take 1 capsule (1,000 Units total) by mouth daily. 30 capsule    feeding supplement (ENSURE ENLIVE / ENSURE PLUS) LIQD Take 237 mLs by mouth 2 (two) times daily between meals. 14220 mL 0   fidaxomicin (DIFICID) 200 MG TABS tablet Take 1 tablet (200 mg total) by mouth 2 (two) times daily for 8 days. 16 tablet 0   finasteride (PROSCAR) 5 MG tablet Take 1 tablet (5 mg total) by mouth daily. 90 tablet 1   memantine (NAMENDA XR) 14 MG CP24 24 hr capsule Take 1 capsule (14 mg total) by mouth daily. 90 capsule 3   metroNIDAZOLE (FLAGYL) 500 MG tablet Take 1 tablet (500 mg total) by mouth every 8 (eight) hours for 8 days. 24 tablet 0   midodrine (PROAMATINE) 5 MG tablet Take 1 tablet (5 mg total) by mouth 3 (three) times daily with meals. 90 tablet 0   sertraline (ZOLOFT) 50 MG tablet Take 1 tablet (50 mg total) by mouth daily. 90 tablet 1   vitamin B-12 (V-R VITAMIN B-12) 500 MCG tablet Take 1 tablet (500 mcg total) by mouth daily.  No facility-administered medications prior to visit.     Per HPI unless specifically indicated in ROS section below Review of Systems  Objective:  BP 132/84   Pulse 66   Temp (!) 97.2 F (36.2 C) (Temporal)   Ht '6\' 1"'$  (1.854 m)   Wt 182 lb (82.6 kg)   SpO2 98%   BMI 24.01 kg/m   Wt Readings from Last 3 Encounters:  07/03/21 182 lb (82.6 kg)  06/23/21 180 lb 12.4 oz (82 kg)  06/04/21 182 lb 1 oz (82.6 kg)      Physical Exam Constitutional:      Appearance: Normal appearance. He is not ill-appearing.  HENT:     Mouth/Throat:     Mouth: Mucous membranes are moist.     Pharynx:  No oropharyngeal exudate or posterior oropharyngeal erythema.     Comments:  Pharynx clear White patch on tongue to left of midline Eyes:     Extraocular Movements: Extraocular movements intact.     Pupils: Pupils are equal, round, and reactive to light.  Cardiovascular:     Rate and Rhythm: Normal rate and regular rhythm.     Pulses: Normal pulses.     Heart sounds: Normal heart sounds. No murmur heard. Pulmonary:     Effort: Pulmonary effort is normal. No respiratory distress.     Breath sounds: Normal breath sounds. No wheezing, rhonchi or rales.  Abdominal:     General: Bowel sounds are normal. There is no distension.     Palpations: Abdomen is soft. There is no mass.     Tenderness: There is no abdominal tenderness. There is no guarding or rebound.     Hernia: No hernia is present.  Skin:    General: Skin is warm and dry.     Findings: No rash.  Neurological:     Mental Status: He is alert.  Psychiatric:        Mood and Affect: Mood normal.        Behavior: Behavior normal.      Lab Results  Component Value Date   CREATININE 1.24 06/28/2021   BUN 15 06/27/2021   NA 139 06/27/2021   K 3.9 06/27/2021   CL 104 06/27/2021   CO2 27 06/27/2021    Lab Results  Component Value Date   WBC 8.1 06/24/2021   HGB 13.0 06/24/2021   HCT 36.4 (L) 06/24/2021   MCV 92.2 06/24/2021   PLT 93 (L) 06/24/2021   Assessment & Plan:  This visit occurred during the SARS-CoV-2 public health emergency.  Safety protocols were in place, including screening questions prior to the visit, additional usage of staff PPE, and extensive cleaning of exam room while observing appropriate contact time as indicated for disinfecting solutions.   Problem List Items Addressed This Visit     Dementia with behavioral disturbance ([place])    This complicates care.  History obtained from wife.       CKD (chronic kidney disease) stage 3, GFR 30-59 ml/min (HCC)    Update renal function.      Dysphagia     Possible thrush - treat with nystatin solution.       Orthostatic hypotension    Stable on midodrine.       Clostridium difficile colitis - Primary    CT confirmed colitis, first recurrence after completing 10d oral vancomycin course.  Currently mid oral flagyl + dificid 10d course with improvement in diarrhea. No further bowel accidents since home. Also receiving acidophilus  probiotic course. Aware to finish all meds prescribed. Update if recurrent C diff symptoms.  Update labs today.       Relevant Medications   nystatin (MYCOSTATIN) 100000 UNIT/ML suspension   Other Relevant Orders   CBC with Differential/Platelet   Basic metabolic panel     Meds ordered this encounter  Medications   nystatin (MYCOSTATIN) 100000 UNIT/ML suspension    Sig: Take 5 mLs (500,000 Units total) by mouth 3 (three) times daily.    Dispense:  120 mL    Refill:  0    Orders Placed This Encounter  Procedures   CBC with Differential/Platelet   Basic metabolic panel     Patient Instructions  Finish up antibiotics and probiotic acidophilus, then could start align or phillips colon health probiotics over the counter Try nystatin for possible oral thrush.  Labs today  Keep [place] updated with how you do.   Follow up plan: Return if symptoms worsen or fail to improve.  [name], MD

## 2021-07-03 NOTE — Assessment & Plan Note (Signed)
Possible thrush - treat with nystatin solution.

## 2021-07-03 NOTE — Assessment & Plan Note (Signed)
This complicates care.  History obtained from wife.

## 2021-07-03 NOTE — Assessment & Plan Note (Signed)
Update renal function.  

## 2021-07-03 NOTE — Patient Instructions (Signed)
Finish up antibiotics and probiotic acidophilus, then could start align or phillips colon health probiotics over the counter Try nystatin for possible oral thrush.  Labs today  Keep [place] updated with how you do.

## 2021-07-03 NOTE — Assessment & Plan Note (Signed)
Stable on midodrine.

## 2021-07-03 NOTE — Assessment & Plan Note (Addendum)
CT confirmed colitis, first recurrence after completing 10d oral vancomycin course.  Currently mid oral flagyl + dificid 10d course with improvement in diarrhea. No further bowel accidents since home. Also receiving acidophilus probiotic course. Aware to finish all meds prescribed. Update if recurrent C diff symptoms.  Update labs today.

## 2021-07-04 ENCOUNTER — Encounter: Payer: Self-pay | Admitting: Family Medicine

## 2021-07-04 NOTE — Progress Notes (Signed)
Carelink Summary Report / Loop Recorder 

## 2021-07-09 ENCOUNTER — Ambulatory Visit (INDEPENDENT_AMBULATORY_CARE_PROVIDER_SITE_OTHER): Payer: Medicare Other

## 2021-07-09 DIAGNOSIS — R55 Syncope and collapse: Secondary | ICD-10-CM

## 2021-07-09 LAB — CUP PACEART REMOTE DEVICE CHECK
Date Time Interrogation Session: 20220826043503
Implantable Pulse Generator Implant Date: 20191120

## 2021-07-20 NOTE — Progress Notes (Signed)
Carelink Summary Report / Loop Recorder 

## 2021-08-02 ENCOUNTER — Encounter (HOSPITAL_BASED_OUTPATIENT_CLINIC_OR_DEPARTMENT_OTHER): Payer: Self-pay | Admitting: Family

## 2021-08-02 ENCOUNTER — Ambulatory Visit (HOSPITAL_BASED_OUTPATIENT_CLINIC_OR_DEPARTMENT_OTHER): Payer: Medicare Other | Admitting: Family

## 2021-08-02 ENCOUNTER — Other Ambulatory Visit: Payer: Self-pay

## 2021-08-02 VITALS — BP 112/62 | HR 57 | Ht 73.0 in | Wt 182.2 lb

## 2021-08-02 DIAGNOSIS — R001 Bradycardia, unspecified: Secondary | ICD-10-CM | POA: Diagnosis not present

## 2021-08-02 DIAGNOSIS — I951 Orthostatic hypotension: Secondary | ICD-10-CM

## 2021-08-02 DIAGNOSIS — R29898 Other symptoms and signs involving the musculoskeletal system: Secondary | ICD-10-CM | POA: Diagnosis not present

## 2021-08-02 DIAGNOSIS — R55 Syncope and collapse: Secondary | ICD-10-CM | POA: Diagnosis not present

## 2021-08-02 DIAGNOSIS — Z95818 Presence of other cardiac implants and grafts: Secondary | ICD-10-CM | POA: Diagnosis not present

## 2021-08-02 DIAGNOSIS — I4510 Unspecified right bundle-branch block: Secondary | ICD-10-CM | POA: Diagnosis not present

## 2021-08-02 NOTE — Progress Notes (Signed)
Office Visit    Patient Name: Dylan [name] Date of Encounter: 08/02/2021  PCP:  [name], [place]  Cardiologist:  Quay Burow, MD (vascular) / Shirlee More, MD (general cardiology) Advanced Practice Provider:  No care team member to display Electrophysiologist:  Will [name], MD   Chief Complaint    Dylan [name] is a 87 y.o. male with a hx of syncope, orthostatic hypotension, s/p ILR implant, dementia, hyperlipidemia, CKD, PAD, RBBB presents today for bradycardia   Past Medical History    Past Medical History:  Diagnosis Date   Basal cell carcinoma 08/31/2020   Mid forehead - ED&C    Basal cell carcinoma of right nasal sidewall 12/15/2014   Bilateral hearing loss 08/03/2018   S/p audiology evaluation, has new hearing aides.   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/2014   Mohs defect of eyelid 12/14/2014   Nuclear sclerosis, left 04/01/2017   Obstruction of right lacrimal duct 10/16/2015   PAD (peripheral artery disease) ([place]) 03/21/2020   Abnormal ABI, abnormal arterial duplex 03/2020:  R - moderate RLE disease, abnormal TBI L - WNL, abnormal TBI Referred for vascular consult - rec medical management   Pruritus 02/19/2020   Recurrent syncope 06/22/2018   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   Right leg swelling 02/19/2020   Sepsis ([place])  05/30/2020   Thrombocytopenia ([place]) 02/21/2011   periph smear stable 02/2020   Vitamin B12 deficiency 08/03/2018   Completed b12 shots 09/2018, maintaining levels on oral replacement (dissolvable tablets) 01/2019   Vitamin D deficiency 05/19/2018   Past Surgical History:  Procedure Laterality Date   BASAL CELL CARCINOMA EXCISION Right 01/2015   with reconstructive surgery around eye   CATARACT EXTRACTION Left 03/2017   ETT  09/2018   negative for ischemia, extremely poor exercise tolerance ([place])   FOOT SURGERY Left 1998   metatarsal amputation after injury   KNEE ARTHROSCOPY  1993   Right   KNEE ARTHROSCOPY  01/2001   Left   LOOP RECORDER INSERTION N/A 09/30/2018   Procedure: LOOP RECORDER INSERTION;  Surgeon: Constance Haw, MD;  Location: [facility];  Service: Cardiovascular;  Laterality: N/A;    Allergies  Allergies  Allergen Reactions   Doxepin Other (See Comments)    Lethargy, overly sedating at low dose   Lincomycin Hcl Rash    History of Present Illness    Dylan [name] is a 87 y.o. male with a hx of syncope, orthostatic hypotension, s/p ILR implant, dementia, hyperlipidemia, CKD, PAD, RBBB last seen 08/2020 by Dr. Curt Bears.   Evaluated 03/28/20 by Dr. [name] due to PAD. Dopplers 03/15/20 revealed right ABI 0.79 with high grade disease in popliteal arteryand left of 1.22 which was essentially normal. He was without claudication symptoms and further workup was not recommended. Last seen 08/29/20 by Dr. Curt Bears doing well from  cardiac perspective.   Admitted 06/23/21-06/28/21 for colitis with presyncopal episode in setting of diarrhea, c. Dif, and dehydration.   Presents today for follow up. History assisted by his wife due to his dementia. He is pleasantly confused. Reports no shortness of breath nor dyspnea on exertion. Reports no chest pain, pressure, or tightness. No edema, orthopnea, PND. Reports no palpitations. Wife endorses occasional lightheadedness  but no near syncope nor syncope. Not drinking much and wife has been trying to encourage   EKGs/Labs/Other Studies Reviewed:   The following studies were reviewed today:   EKG:  No EKG today.  Recent Labs: 08/09/2020: TSH 1.45 05/04/2021: Magnesium 1.8 06/24/2021: ALT 18 07/03/2021: BUN 19; Creatinine, Ser 1.26; Hemoglobin 15.0; Platelets 121.0; Potassium 4.2; Sodium 139  Recent Lipid Panel    Component Value Date/Time   CHOL 210 (H) 08/09/2020 1153   TRIG 236.0 (H) 08/09/2020 1153   HDL 34.80 (L) 08/09/2020 1153   CHOLHDL 6 08/09/2020 1153   VLDL 47.2 (H) 08/09/2020 1153   LDLCALC 113 (H) 08/04/2019 1006   LDLDIRECT 138.0 08/09/2020 1153     Home Medications   No outpatient medications have been marked as taking for the 08/02/21 encounter (Appointment) with Loel Dubonnet, NP.     Review of Systems      All other systems reviewed and are otherwise negative except as noted above.  Physical Exam    VS:  There were no vitals taken for this visit. , BMI There is no height or weight on file to calculate BMI.  Wt Readings from Last 3 Encounters:  07/03/21 182 lb (82.6 kg)  06/23/21 180 lb 12.4 oz (82 kg)  06/04/21 182 lb 1 oz (82.6 kg)     GEN: Well nourished, well developed, in no acute distress. HEENT: normal. Neck: Supple, no JVD, carotid bruits, or masses. Cardiac: RRR, no murmurs, rubs, or gallops. No clubbing, cyanosis, edema.  Radials/PT 2+ and equal bilaterally.  Respiratory:  Respirations regular and unlabored, clear to auscultation bilaterally. GI: Soft, nontender, nondistended. MS: No deformity or atrophy. Skin: Warm and dry, no rash. Neuro:  Strength and sensation are intact. Psych: Normal affect. Alert to self and place. Not alert to situation.   Assessment & Plan    Syncope S/p ILR - No acute findings thus far by monitoring. Bradycardia with no significant pause nor symptoms. Upcoming follow up with Dr. Curt Bears.   RBBB - No new near syncope nor  syncope. Lightheadedness consistent with orthostatic hypotension. Monitor with periiodic EKG.   Orthostatic hypotension - Recommend continuation of Midodrine 5mg  TID. Primary care refills and has reached out to their office to request. Orthostatic precautions such as adequate hydration and slow position changes encouraged.   Deconditioning - May benefit from [facility]. Encouraged to discuss with PCP at upcoming follow up.   Disposition: Follow up in October as scheduled with Dr. Curt Bears and in 6 months with Dr. Bettina Gavia.   Signed, Loel Dubonnet, NP 08/02/2021, 1:12 PM [facility]

## 2021-08-02 NOTE — Patient Instructions (Addendum)
Medication Instructions:  Continue your current medications.   *If you need a refill on your cardiac medications before your next appointment, please call your pharmacy*  Lab Work: None ordered today.   Testing/Procedures: Your recent EKG in the hospital showed normal sinus rhythm and was stable compared to previous.    Follow-Up: At [facility], you and your health needs are our priority.  As part of our continuing mission to provide you with exceptional heart care, we have created designated Provider Care Teams.  These Care Teams include your primary Cardiologist (physician) and Advanced Practice Providers (APPs -  Physician Assistants and Nurse Practitioners) who all work together to provide you with the care you need, when you need it.  We recommend signing up for the patient portal called "MyChart".  Sign up information is provided on this After Visit Summary.  MyChart is used to connect with patients for Virtual Visits (Telemedicine).  Patients are able to view lab/test results, encounter notes, upcoming appointments, etc.  Non-urgent messages can be sent to your provider as well.   To learn more about what you can do with MyChart, go to [email or web address].    Your next appointment:   As scheduled with Dr. Curt Bears AND In 6 months with Dr. Bettina Gavia   Other Instructions  Exercises to do While Sitting Exercises that you do while sitting (chair exercises) can give you many of the same benefits as full exercise. Benefits include strengthening your heart, burning calories, and keeping muscles and joints healthy. Exercise can also improve your mood and help with depression and anxiety. You may benefit from chair exercises if you are unable to do standing exercises due to: Diabetic foot pain. Obesity. Illness. Arthritis. Recovery from surgery or injury. Breathing problems. Balance problems. Another type of disability. Before starting chair exercises, check with your health  care provider or a physical therapist to find out how much exercise you can tolerate and which exercises are safe for you. If your health care provider approves: Start out slowly and build up over time. Aim to work up to about 10-20 minutes for each exercise session. Make exercise part of your daily routine. Drink water when you exercise. Do not wait until you are thirsty. Drink every 10-15 minutes. Stop exercising right away if you have pain, nausea, shortness of breath, or dizziness. If you are exercising in a wheelchair, make sure to lock the wheels. Ask your health care provider whether you can do tai chi or yoga. Many positions in these mind-body exercises can be modified to do while seated. Warm-up Before starting other exercises: Sit up as straight as you can. Have your knees bent at 90 degrees, which is the shape of the capital letter "L." Keep your feet flat on the floor. Sit at the front edge of your chair, if you can. Pull in (tighten) the muscles in your abdomen and stretch your spine and neck as straight as you can. Hold this position for a few minutes. Breathe in and out evenly. Try to concentrate on your breathing, and relax your mind. Stretching Exercise A: Arm stretch Hold your arms out straight in front of your body. Bend your hands at the wrist with your fingers pointing up, as if signaling someone to stop. Notice the slight tension in your forearms as you hold the position. Keeping your arms out and your hands bent, rotate your hands outward as far as you can and hold this stretch. Aim to have your thumbs pointing up and your  pinkie fingers pointing down. Slowly repeat arm stretches for one minute as tolerated. Exercise B: Leg stretch If you can move your legs, try to "draw" letters on the floor with the toes of your foot. Write your name with one foot. Write your name with the toes of your other foot. Slowly repeat the movements for one minute as tolerated. Exercise C:  Reach for the sky Reach your hands as far over your head as you can to stretch your spine. Move your hands and arms as if you are climbing a rope. Slowly repeat the movements for one minute as tolerated. Range of motion exercises Exercise A: Shoulder roll Let your arms hang loosely at your sides. Lift just your shoulders up toward your ears, then let them relax back down. When your shoulders feel loose, rotate your shoulders in backward and forward circles. Do shoulder rolls slowly for one minute as tolerated. Exercise B: March in place As if you are marching, pump your arms and lift your legs up and down. Lift your knees as high as you can. If you are unable to lift your knees, just pump your arms and move your ankles and feet up and down. March in place for one minute as tolerated. Exercise C: Seated jumping jacks Let your arms hang down straight. Keeping your arms straight, lift them up over your head. Aim to point your fingers to the ceiling. While you lift your arms, straighten your legs and slide your heels along the floor to your sides, as wide as you can. As you bring your arms back down to your sides, slide your legs back together. If you are unable to use your legs, just move your arms. Slowly repeat seated jumping jacks for one minute as tolerated. Strengthening exercises Exercise A: Shoulder squeeze Hold your arms straight out from your body to your sides, with your elbows bent and your fists pointed at the ceiling. Keeping your arms in the bent position, move them forward so your elbows and forearms meet in front of your face. Open your arms back out as wide as you can with your elbows still bent, until you feel your shoulder blades squeezing together. Hold for 5 seconds. Slowly repeat the movements forward and backward for one minute as tolerated. Contact a health care provider if: You have to stop exercising due to any of the following: Pain. Nausea. Shortness of  breath. Dizziness. Fatigue. You have significant pain or soreness after exercising. Get help right away if: You have chest pain. You have difficulty breathing. These symptoms may represent a serious problem that is an emergency. Do not wait to see if the symptoms will go away. Get medical help right away. Call your local emergency services (911 in the [place] Do not drive yourself to the hospital. Summary Exercises that you do while sitting (chair exercises) can strengthen your heart, burn calories, and keep muscles and joints healthy. You may benefit from chair exercises if you are unable to do standing exercises due to diabetic foot pain, obesity, recovery from surgery or injury, or other conditions. Before starting chair exercises, check with your health care provider or a physical therapist to find out how much exercise you can tolerate and which exercises are safe for you. This information is not intended to replace advice given to you by your health care provider. Make sure you discuss any questions you have with your health care provider. Document Revised: 12/24/2020 Document Reviewed: 12/24/2020 Elsevier Patient Education  2022 [employer].

## 2021-08-06 ENCOUNTER — Other Ambulatory Visit: Payer: Self-pay | Admitting: Family Medicine

## 2021-08-06 MED ORDER — MEMANTINE HCL ER 14 MG PO CP24: 14.0000 mg | ORAL_CAPSULE | Freq: Every day | ORAL | 3 refills | Status: DC

## 2021-08-06 NOTE — Telephone Encounter (Signed)
Pt needs a refill on memantine (NAMENDA XR) 14 MG CP24 24 hr capsule sent to [facility]

## 2021-08-06 NOTE — Telephone Encounter (Signed)
Namenda XR Last rx:  08/16/20, #90 Last OV:  07/03/21, hosp f/u Next OV: 08/20/21, AWV

## 2021-08-06 NOTE — Addendum Note (Signed)
Addended by: Brenton Grills on: 1/46/4314 27:67 AM   Modules accepted: Orders

## 2021-08-08 ENCOUNTER — Other Ambulatory Visit: Payer: Self-pay | Admitting: Family Medicine

## 2021-08-08 LAB — CUP PACEART REMOTE DEVICE CHECK
Date Time Interrogation Session: 20220926043605
Implantable Pulse Generator Implant Date: 20191120

## 2021-08-08 MED ORDER — MIDODRINE HCL 5 MG PO TABS: 5.0000 mg | ORAL_TABLET | Freq: Three times a day (TID) | ORAL | 11 refills | Status: AC

## 2021-08-08 NOTE — Progress Notes (Signed)
ERx midodrine.

## 2021-08-09 ENCOUNTER — Ambulatory Visit (INDEPENDENT_AMBULATORY_CARE_PROVIDER_SITE_OTHER): Payer: Medicare Other

## 2021-08-09 DIAGNOSIS — R55 Syncope and collapse: Secondary | ICD-10-CM

## 2021-08-12 ENCOUNTER — Other Ambulatory Visit: Payer: Self-pay | Admitting: Family Medicine

## 2021-08-12 DIAGNOSIS — E559 Vitamin D deficiency, unspecified: Secondary | ICD-10-CM

## 2021-08-12 DIAGNOSIS — E538 Deficiency of other specified B group vitamins: Secondary | ICD-10-CM

## 2021-08-12 DIAGNOSIS — N183 Chronic kidney disease, stage 3 unspecified: Secondary | ICD-10-CM

## 2021-08-12 DIAGNOSIS — E785 Hyperlipidemia, unspecified: Secondary | ICD-10-CM

## 2021-08-13 ENCOUNTER — Other Ambulatory Visit: Payer: Medicare Other

## 2021-08-14 ENCOUNTER — Other Ambulatory Visit: Payer: Self-pay

## 2021-08-16 NOTE — Progress Notes (Signed)
Carelink Summary Report / Loop Recorder 

## 2021-08-17 ENCOUNTER — Ambulatory Visit: Payer: Medicare Other

## 2021-08-20 ENCOUNTER — Ambulatory Visit (INDEPENDENT_AMBULATORY_CARE_PROVIDER_SITE_OTHER): Payer: Medicare Other | Admitting: Family Medicine

## 2021-08-20 ENCOUNTER — Encounter: Payer: Self-pay | Admitting: Family Medicine

## 2021-08-20 ENCOUNTER — Other Ambulatory Visit: Payer: Self-pay

## 2021-08-20 VITALS — BP 118/66 | HR 54 | Temp 97.3°F | Ht 72.5 in | Wt 183.2 lb

## 2021-08-20 DIAGNOSIS — I739 Peripheral vascular disease, unspecified: Secondary | ICD-10-CM

## 2021-08-20 DIAGNOSIS — E559 Vitamin D deficiency, unspecified: Secondary | ICD-10-CM | POA: Diagnosis not present

## 2021-08-20 DIAGNOSIS — I452 Bifascicular block: Secondary | ICD-10-CM

## 2021-08-20 DIAGNOSIS — Z0000 Encounter for general adult medical examination without abnormal findings: Secondary | ICD-10-CM

## 2021-08-20 DIAGNOSIS — F03918 Unspecified dementia, unspecified severity, with other behavioral disturbance: Secondary | ICD-10-CM

## 2021-08-20 DIAGNOSIS — E785 Hyperlipidemia, unspecified: Secondary | ICD-10-CM

## 2021-08-20 DIAGNOSIS — D696 Thrombocytopenia, unspecified: Secondary | ICD-10-CM

## 2021-08-20 DIAGNOSIS — R55 Syncope and collapse: Secondary | ICD-10-CM

## 2021-08-20 DIAGNOSIS — E538 Deficiency of other specified B group vitamins: Secondary | ICD-10-CM | POA: Diagnosis not present

## 2021-08-20 DIAGNOSIS — N183 Chronic kidney disease, stage 3 unspecified: Secondary | ICD-10-CM | POA: Diagnosis not present

## 2021-08-20 DIAGNOSIS — Z23 Encounter for immunization: Secondary | ICD-10-CM

## 2021-08-20 DIAGNOSIS — A0472 Enterocolitis due to Clostridium difficile, not specified as recurrent: Secondary | ICD-10-CM

## 2021-08-20 DIAGNOSIS — R1310 Dysphagia, unspecified: Secondary | ICD-10-CM

## 2021-08-20 DIAGNOSIS — F66 Other sexual disorders: Secondary | ICD-10-CM

## 2021-08-20 LAB — COMPREHENSIVE METABOLIC PANEL
ALT: 26 U/L (ref 0–53)
AST: 26 U/L (ref 0–37)
Albumin: 4.4 g/dL (ref 3.5–5.2)
Alkaline Phosphatase: 82 U/L (ref 39–117)
BUN: 19 mg/dL (ref 6–23)
CO2: 31 mEq/L (ref 19–32)
Calcium: 9.6 mg/dL (ref 8.4–10.5)
Chloride: 100 mEq/L (ref 96–112)
Creatinine, Ser: 1.3 mg/dL (ref 0.40–1.50)
GFR: 50.09 mL/min — ABNORMAL LOW (ref >60.00)
Glucose, Bld: 75 mg/dL (ref 70–99)
Potassium: 4.3 mEq/L (ref 3.5–5.1)
Sodium: 139 mEq/L (ref 135–145)
Total Bilirubin: 0.6 mg/dL (ref 0.2–1.2)
Total Protein: 7 g/dL (ref 6.0–8.3)

## 2021-08-20 LAB — LIPID PANEL
Cholesterol: 210 mg/dL — ABNORMAL HIGH (ref 0–200)
HDL: 36.7 mg/dL — ABNORMAL LOW (ref >39.00)
NonHDL: 173.66
Total CHOL/HDL Ratio: 6
Triglycerides: 332 mg/dL — ABNORMAL HIGH (ref 0.0–149.0)
VLDL: 66.4 mg/dL — ABNORMAL HIGH (ref 0.0–40.0)

## 2021-08-20 LAB — CBC WITH DIFFERENTIAL/PLATELET
Basophils Absolute: 0.1 10*3/uL (ref 0.0–0.1)
Basophils Relative: 1.1 % (ref 0.0–3.0)
Eosinophils Absolute: 0.1 10*3/uL (ref 0.0–0.7)
Eosinophils Relative: 1.3 % (ref 0.0–5.0)
HCT: 47.7 % (ref 39.0–52.0)
Hemoglobin: 16.2 g/dL (ref 13.0–17.0)
Lymphocytes Relative: 21.2 % (ref 12.0–46.0)
Lymphs Abs: 1.3 10*3/uL (ref 0.7–4.0)
MCHC: 33.9 g/dL (ref 30.0–36.0)
MCV: 95 fl (ref 78.0–100.0)
Monocytes Absolute: 0.8 10*3/uL (ref 0.1–1.0)
Monocytes Relative: 12 % (ref 3.0–12.0)
Neutro Abs: 4.1 10*3/uL (ref 1.4–7.7)
Neutrophils Relative %: 64.4 % (ref 43.0–77.0)
Platelets: 107 10*3/uL — ABNORMAL LOW (ref 150.0–400.0)
RBC: 5.02 Mil/uL (ref 4.22–5.81)
RDW: 14 % (ref 11.5–15.5)
WBC: 6.3 10*3/uL (ref 4.0–10.5)

## 2021-08-20 LAB — LDL CHOLESTEROL, DIRECT: Direct LDL: 140 mg/dL

## 2021-08-20 LAB — MICROALBUMIN / CREATININE URINE RATIO
Creatinine,U: 135.7 mg/dL
Microalb Creat Ratio: 1.5 mg/g (ref 0.0–30.0)
Microalb, Ur: 2.1 mg/dL — ABNORMAL HIGH (ref 0.0–1.9)

## 2021-08-20 LAB — VITAMIN D 25 HYDROXY (VIT D DEFICIENCY, FRACTURES): VITD: 54.44 ng/mL (ref 30.00–100.00)

## 2021-08-20 LAB — VITAMIN B12: Vitamin B-12: 723 pg/mL (ref 211–911)

## 2021-08-20 MED ORDER — SERTRALINE HCL 100 MG PO TABS: 100.0000 mg | ORAL_TABLET | Freq: Every day | ORAL | 3 refills | Status: DC

## 2021-08-20 NOTE — Patient Instructions (Addendum)
Flu shot today  Labs today  Increase sertraline to 100mg  daily. Update me with effect.  Good to see you today, return as needed or in 4 months for follow up visit.   Health Maintenance After Age 85 After age 31, you are at a higher risk for certain long-term diseases and infections as well as injuries from falls. Falls are a major cause of broken bones and head injuries in people who are older than age 10. Getting regular preventive care can help to keep you healthy and well. Preventive care includes getting regular testing and making lifestyle changes as recommended by your health care provider. Talk with your health care provider about: Which screenings and tests you should have. A screening is a test that checks for a disease when you have no symptoms. A diet and exercise plan that is right for you. What should I know about screenings and tests to prevent falls? Screening and testing are the best ways to find a health problem early. Early diagnosis and treatment give you the best chance of managing medical conditions that are common after age 63. Certain conditions and lifestyle choices may make you more likely to have a fall. Your health care provider may recommend: Regular vision checks. Poor vision and conditions such as cataracts can make you more likely to have a fall. If you wear glasses, make sure to get your prescription updated if your vision changes. Medicine review. Work with your health care provider to regularly review all of the medicines you are taking, including over-the-counter medicines. Ask your health care provider about any side effects that may make you more likely to have a fall. Tell your health care provider if any medicines that you take make you feel dizzy or sleepy. Osteoporosis screening. Osteoporosis is a condition that causes the bones to get weaker. This can make the bones weak and cause them to break more easily. Blood pressure screening. Blood pressure changes and  medicines to control blood pressure can make you feel dizzy. Strength and balance checks. Your health care provider may recommend certain tests to check your strength and balance while standing, walking, or changing positions. Foot health exam. Foot pain and numbness, as well as not wearing proper footwear, can make you more likely to have a fall. Depression screening. You may be more likely to have a fall if you have a fear of falling, feel emotionally low, or feel unable to do activities that you used to do. Alcohol use screening. Using too much alcohol can affect your balance and may make you more likely to have a fall. What actions can I take to lower my risk of falls? General instructions Talk with your health care provider about your risks for falling. Tell your health care provider if: You fall. Be sure to tell your health care provider about all falls, even ones that seem minor. You feel dizzy, sleepy, or off-balance. Take over-the-counter and prescription medicines only as told by your health care provider. These include any supplements. Eat a healthy diet and maintain a healthy weight. A healthy diet includes low-fat dairy products, low-fat (lean) meats, and fiber from whole grains, beans, and lots of fruits and vegetables. Home safety Remove any tripping hazards, such as rugs, cords, and clutter. Install safety equipment such as grab bars in bathrooms and safety rails on stairs. Keep rooms and walkways well-lit. Activity  Follow a regular exercise program to stay fit. This will help you maintain your balance. Ask your health care  provider what types of exercise are appropriate for you. If you need a cane or walker, use it as recommended by your health care provider. Wear supportive shoes that have nonskid soles. Lifestyle Do not drink alcohol if your health care provider tells you not to drink. If you drink alcohol, limit how much you have: 0-1 drink a day for women. 0-2 drinks a  day for men. Be aware of how much alcohol is in your drink. In the [place] one drink equals one typical bottle of beer (12 oz), one-half glass of wine (5 oz), or one shot of hard liquor (1 oz). Do not use any products that contain nicotine or tobacco, such as cigarettes and e-cigarettes. If you need help quitting, ask your health care provider. Summary Having a healthy lifestyle and getting preventive care can help to protect your health and wellness after age 37. Screening and testing are the best way to find a health problem early and help you avoid having a fall. Early diagnosis and treatment give you the best chance for managing medical conditions that are more common for people who are older than age 63. Falls are a major cause of broken bones and head injuries in people who are older than age 47. Take precautions to prevent a fall at home. Work with your health care provider to learn what changes you can make to improve your health and wellness and to prevent falls. This information is not intended to replace advice given to you by your health care provider. Make sure you discuss any questions you have with your health care provider. Document Revised: 01/05/2021 Document Reviewed: 10/13/2020 Elsevier Patient Education  2022 [employer].

## 2021-08-20 NOTE — Progress Notes (Signed)
Patient ID: Dylan [name], male    DOB: Jun 24, 1934, 87 y.o.   MRN: [id number]  This visit was conducted in person.  BP 118/66   Pulse (!) 54   Temp (!) 97.3 F (36.3 C) (Temporal)   Ht 6' 0.5" (1.842 m)   Wt 183 lb 3 oz (83.1 kg)   SpO2 100%   BMI 24.50 kg/m    CC: AMW/CPE Subjective:   HPI: Dylan [name] is a 87 y.o. male presenting on 08/20/2021 for Medicare Wellness (Pt accompanied by wife, [name]- temp 97.8.  Wants to discuss sertraline. )   Did not see health advisor this year.   Hearing Screening - Comments:: Wears bilateral hearing aids.  Wearing at today's OV.  Vision Screening - Comments:: Last eye exam, 09/2020.  [facility] Visit from 08/20/2021 in [facility] [place]  PHQ-2 Total Score 0       Fall Risk  08/20/2021 08/16/2020 08/11/2019 08/02/2018  Falls in the past year? 0 1 1 Yes  Comment - - - lost balance  Number falls in past yr: - 0 0 2 or more  Injury with Fall? 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   Progressive dementia on namenda XR 14mg  daily. Has not seen neurology. Slowed walking noted. No rigidity/stiffness. Dragging feet. Not using cane. Wife helps him. No falls or near falls. Wife notes changing behavior changed since C diff - worsening sundowning (eating, roaming) as well as disinhibitions have worsened. She also notes new onset choking/coughing after first swallows in the morning - happens several times a week. He was treated for thrush back in 06/2021 with nystatin swish/swallow.   Recurrent syncope - s/p Linq monitor without recurrent syncope. No noted h/o seizures. continues midodrine TID with good effect.   Wife is primary caregiver - stressful periods.  Nightly urinary incontinence.   Preventative: Colon cancer screening - aged out. Denies changes in BM or blood. Prostate cancer screening - aged out.  Lung cancer  screening - not eligible Flu shot - yearly [place] 11/2019, 12/2019, booster 08/2020  Td 2006, 2011  Pneumovax-23 08/2020 Shingrix - declined  Advanced directive - scanned into chart 12/2019. Wife [name] then Jeneen Rinks are [place]. Does not want life prolonging measures if terminal condition. Based on previous discussions, thinks he would be ok with feeding tube and CPR/compressions.  Seat belt use discussed  Sunscreen use discussed. No changing moles on skin. Sees dermatologist.  Ex smoker  Alcohol  - none Dentist - has dentures Eye exam - yearly  Uses hearing aides  Bowel - no constipation - uses PRN sennekot and miralax  Bladder - nightly urinary incontinence  Lives with wife [facility]) One stepson Occ: retired, Furniture conservator/restorer, 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, [employer]: HS Activity: no regular exercise Diet: some water, fruits/vegetables daily     Relevant past medical, surgical, family and social history reviewed and updated as indicated. Interim medical history since our last visit reviewed. Allergies and medications reviewed and updated. Outpatient Medications Prior to Visit  Medication Sig Dispense Refill   Cholecalciferol (VITAMIN D3) 1000 units CAPS Take 1 capsule (1,000 Units total) by mouth daily. 30 capsule    feeding supplement (ENSURE ENLIVE / ENSURE PLUS) LIQD Take 237 mLs by mouth 2 (two) times daily between meals. 14220 mL 0   finasteride (PROSCAR) 5 MG  tablet Take 1 tablet (5 mg total) by mouth daily. 90 tablet 1   memantine (NAMENDA XR) 14 MG CP24 24 hr capsule Take 1 capsule (14 mg total) by mouth daily. 90 capsule 3   midodrine (PROAMATINE) 5 MG tablet Take 1 tablet (5 mg total) by mouth 3 (three) times daily with meals. 90 tablet 11   vitamin B-12 (V-R VITAMIN B-12) 500 MCG tablet Take 1 tablet (500 mcg total) by mouth daily.     sertraline (ZOLOFT) 50 MG tablet Take 1 tablet (50 mg total) by mouth daily. 90 tablet 1   nystatin (MYCOSTATIN) 100000  UNIT/ML suspension Take 5 mLs (500,000 Units total) by mouth 3 (three) times daily. 120 mL 0   No facility-administered medications prior to visit.     Per HPI unless specifically indicated in ROS section below Review of Systems  Constitutional:  Negative for activity change, appetite change, chills, fatigue, fever and unexpected weight change.  HENT:  Negative for hearing loss.   Eyes:  Negative for visual disturbance.  Respiratory:  Positive for cough and choking. Negative for chest tightness, shortness of breath and wheezing.   Cardiovascular:  Negative for chest pain, palpitations and leg swelling.  Gastrointestinal:  Negative for abdominal distention, abdominal pain, blood in stool, constipation, diarrhea, nausea and vomiting.  Genitourinary:  Negative for difficulty urinating and hematuria.  Musculoskeletal:  Negative for arthralgias, myalgias and neck pain.  Skin:  Negative for rash.  Neurological:  Negative for dizziness, seizures, syncope and headaches.  Hematological:  Negative for adenopathy. Does not bruise/bleed easily.  Psychiatric/Behavioral:  Negative for dysphoric mood. The patient is not nervous/anxious.    Objective:  BP 118/66   Pulse (!) 54   Temp (!) 97.3 F (36.3 C) (Temporal)   Ht 6' 0.5" (1.842 m)   Wt 183 lb 3 oz (83.1 kg)   SpO2 100%   BMI 24.50 kg/m   Wt Readings from Last 3 Encounters:  08/20/21 183 lb 3 oz (83.1 kg)  08/02/21 182 lb 3.2 oz (82.6 kg)  07/03/21 182 lb (82.6 kg)      Physical Exam Vitals and nursing note reviewed.  Constitutional:      General: He is not in acute distress.    Appearance: Normal appearance. He is well-developed. He is not ill-appearing.  HENT:     Head: Normocephalic and atraumatic.     Right Ear: Hearing, tympanic membrane, ear canal and external ear normal.     Left Ear: Hearing, tympanic membrane, ear canal and external ear normal.  Eyes:     General: No scleral icterus.    Extraocular Movements: Extraocular  movements intact.     Conjunctiva/sclera: Conjunctivae normal.     Pupils: Pupils are equal, round, and reactive to light.  Neck:     Thyroid: No thyroid mass or thyromegaly.  Cardiovascular:     Rate and Rhythm: Normal rate and regular rhythm.     Pulses: Normal pulses.          Radial pulses are 2+ on the right side and 2+ on the left side.     Heart sounds: Normal heart sounds. No murmur heard. Pulmonary:     Effort: Pulmonary effort is normal. No respiratory distress.     Breath sounds: Normal breath sounds. No wheezing, rhonchi or rales.  Abdominal:     General: Bowel sounds are normal. There is no distension.     Palpations: Abdomen is soft. There is no mass.  Tenderness: There is no abdominal tenderness. There is no guarding or rebound.     Hernia: No hernia is present.  Musculoskeletal:        General: Normal range of motion.     Cervical back: Normal range of motion and neck supple.     Right lower leg: No edema.     Left lower leg: No edema.  Lymphadenopathy:     Cervical: No cervical adenopathy.  Skin:    General: Skin is warm and dry.     Findings: No rash.  Neurological:     General: No focal deficit present.     Mental Status: He is alert and oriented to person, place, and time.  Psychiatric:        Mood and Affect: Mood normal.        Behavior: Behavior normal.        Thought Content: Thought content normal.        Judgment: Judgment normal.      Results for orders placed or performed in visit on 08/20/21  Microalbumin / creatinine urine ratio  Result Value Ref Range   Microalb, Ur 2.1 (H) 0.0 - 1.9 mg/dL   Creatinine,U 135.7 mg/dL   Microalb Creat Ratio 1.5 0.0 - 30.0 mg/g  VITAMIN D 25 Hydroxy (Vit-D Deficiency, Fractures)  Result Value Ref Range   VITD 54.44 30.00 - 100.00 ng/mL  Vitamin B12  Result Value Ref Range   Vitamin B-12 723 211 - 911 pg/mL  CBC with Differential/Platelet  Result Value Ref Range   WBC 6.3 4.0 - 10.5 K/uL   RBC 5.02  4.22 - 5.81 Mil/uL   Hemoglobin 16.2 13.0 - 17.0 g/dL   HCT 47.7 39.0 - 52.0 %   MCV 95.0 78.0 - 100.0 fl   MCHC 33.9 30.0 - 36.0 g/dL   RDW 14.0 11.5 - 15.5 %   Platelets 107.0 (L) 150.0 - 400.0 K/uL   Neutrophils Relative % 64.4 43.0 - 77.0 %   Lymphocytes Relative 21.2 12.0 - 46.0 %   Monocytes Relative 12.0 3.0 - 12.0 %   Eosinophils Relative 1.3 0.0 - 5.0 %   Basophils Relative 1.1 0.0 - 3.0 %   Neutro Abs 4.1 1.4 - 7.7 K/uL   Lymphs Abs 1.3 0.7 - 4.0 K/uL   Monocytes Absolute 0.8 0.1 - 1.0 K/uL   Eosinophils Absolute 0.1 0.0 - 0.7 K/uL   Basophils Absolute 0.1 0.0 - 0.1 K/uL  Lipid panel  Result Value Ref Range   Cholesterol 210 (H) 0 - 200 mg/dL   Triglycerides 332.0 (H) 0.0 - 149.0 mg/dL   HDL 36.70 (L) >39.00 mg/dL   VLDL 66.4 (H) 0.0 - 40.0 mg/dL   Total CHOL/HDL Ratio 6    NonHDL 173.66   Comprehensive metabolic panel  Result Value Ref Range   Sodium 139 135 - 145 mEq/L   Potassium 4.3 3.5 - 5.1 mEq/L   Chloride 100 96 - 112 mEq/L   CO2 31 19 - 32 mEq/L   Glucose, Bld 75 70 - 99 mg/dL   BUN 19 6 - 23 mg/dL   Creatinine, Ser 1.30 0.40 - 1.50 mg/dL   Total Bilirubin 0.6 0.2 - 1.2 mg/dL   Alkaline Phosphatase 82 39 - 117 U/L   AST 26 0 - 37 U/L   ALT 26 0 - 53 U/L   Total Protein 7.0 6.0 - 8.3 g/dL   Albumin 4.4 3.5 - 5.2 g/dL   GFR 50.09 (L) >60.00  mL/min   Calcium 9.6 8.4 - 10.5 mg/dL  LDL cholesterol, direct  Result Value Ref Range   Direct LDL 140.0 mg/dL    Lab Results  Component Value Date   TSH 1.45 08/09/2020    Assessment & Plan:  This visit occurred during the SARS-CoV-2 public health emergency.  Safety protocols were in place, including screening questions prior to the visit, additional usage of staff PPE, and extensive cleaning of exam room while observing appropriate contact time as indicated for disinfecting solutions.   Problem List Items Addressed This Visit     Health maintenance examination (Chronic)    Preventative protocols reviewed  and updated unless pt declined. Discussed healthy diet and lifestyle.       Medicare annual wellness visit, subsequent - Primary (Chronic)    I have personally reviewed the Medicare Annual Wellness questionnaire and have noted 1. The patient's medical and social history 2. Their use of alcohol, tobacco or illicit drugs 3. Their current medications and supplements 4. The patient's functional ability including ADL's, fall risks, home safety risks and hearing or visual impairment. Cognitive function has been assessed and addressed as indicated.  5. Diet and physical activity 6. Evidence for depression or mood disorders The patients weight, height, BMI have been recorded in the chart. I have made referrals, counseling and provided education to the patient based on review of the above and I have provided the pt with a written personalized care plan for preventive services. Provider list updated.. See scanned questionairre as needed for further documentation. Reviewed preventative protocols and updated unless pt declined.       Dyslipidemia    Update levels off replacement. Consider stopping screening as unlikely to recommend starting statin. The ASCVD Risk score (Arnett DK, et al., 2019) failed to calculate for the following reasons:   The 2019 ASCVD risk score is only valid for ages 67 to 46       Right bundle branch block with left anterior fascicular block    Regularly sees cardiology/EP for h/o bifascicular block. Loop recorder in place.       Thrombocytopenia (HCC)    Chronic, stable. Update CBC. ?ITP related.       Vitamin D deficiency    Chronic, update levels on 1000 IU daily.       Dementia with behavioral disturbance    Alz vs Frontotemporal Dementia. Does not exhibit significant parkinsonisms although seems to have autonomic instability manifested by orthostatic hypotension. Aricept trial not tolerated due to oversedation. Continues namenda XR 14mg  daily. if truly FTD, may  not be needed. They have previously declined neurological evaluation.       Relevant Medications   sertraline (ZOLOFT) 100 MG tablet   Recurrent syncope    Stable period on midodrine 5mg  2-3 times daily      Vitamin B12 deficiency    Update levels on 525mcg daily.       CKD (chronic kidney disease) stage 3, GFR 30-59 ml/min (HCC)    Update renal function.       Dysphagia    Ritta Slot has been treated. Wife notes ongoing trouble with choking, coughing upon first eating in the mornings.  Will check barium swallow.       Relevant Orders   DG ESOPHAGUS W DOUBLE CM (HD)   PAD (peripheral artery disease) (HCC)    Continue statin.      Sexual disinhibition    Again deteriorated despite sertraline 50mg  daily - will increase to 100mg  daily.  Update with effect.       Clostridium difficile colitis    No further diarrhea      Other Visit Diagnoses     Need for influenza vaccination       Relevant Orders   Flu Vaccine QUAD High Dose(Fluad) (Completed)        Meds ordered this encounter  Medications   sertraline (ZOLOFT) 100 MG tablet    Sig: Take 1 tablet (100 mg total) by mouth daily.    Dispense:  90 tablet    Refill:  3    Note new dose   Orders Placed This Encounter  Procedures   DG ESOPHAGUS W DOUBLE CM (HD)    Standing Status:   Future    Standing Expiration Date:   08/20/2022    Order Specific Question:   Reason for Exam (SYMPTOM  OR DIAGNOSIS REQUIRED)    Answer:   dysphagia, choking/coughing with swallowing in am    Order Specific Question:   Preferred imaging location?    Answer:   [facility]    Order Specific Question:   Radiology Contrast Protocol - do NOT remove file path    Answer:   [email or web address]   Flu Vaccine QUAD High Dose(Fluad)   LDL cholesterol, direct    Patient instructions: Flu shot today  Labs today  Increase sertraline to 100mg  daily. Update me with effect.  Good to see you today,  return as needed or in 4 months for follow up visit.   Follow up plan: Return in about 4 months (around 12/21/2021) for follow up visit.  [name], MD

## 2021-08-21 NOTE — Assessment & Plan Note (Signed)
No further diarrhea

## 2021-08-21 NOTE — Assessment & Plan Note (Addendum)
Alz vs Frontotemporal Dementia. Does not exhibit significant parkinsonisms although seems to have autonomic instability manifested by orthostatic hypotension. Aricept trial not tolerated due to oversedation. Continues namenda XR 14mg  daily. if truly FTD, may not be needed. They have previously declined neurological evaluation.

## 2021-08-21 NOTE — Assessment & Plan Note (Signed)
Update levels on 564mg daily.

## 2021-08-21 NOTE — Assessment & Plan Note (Addendum)
Regularly sees cardiology/EP for h/o bifascicular block. Loop recorder in place.

## 2021-08-21 NOTE — Assessment & Plan Note (Signed)
Chronic, stable. Update CBC. ?ITP related.

## 2021-08-21 NOTE — Assessment & Plan Note (Signed)
Dylan [name] has been treated. Wife notes ongoing trouble with choking, coughing upon first eating in the mornings.  Will check barium swallow.

## 2021-08-21 NOTE — Assessment & Plan Note (Signed)
Preventative protocols reviewed and updated unless pt declined. Discussed healthy diet and lifestyle.  

## 2021-08-21 NOTE — Assessment & Plan Note (Signed)
Chronic, update levels on 1000 IU daily.

## 2021-08-21 NOTE — Assessment & Plan Note (Signed)
Update levels off replacement. Consider stopping screening as unlikely to recommend starting statin. The ASCVD Risk score (Arnett DK, et al., 2019) failed to calculate for the following reasons:   The 2019 ASCVD risk score is only valid for ages 76 to 81

## 2021-08-21 NOTE — Assessment & Plan Note (Signed)
Stable period on midodrine 5mg  2-3 times daily

## 2021-08-21 NOTE — Assessment & Plan Note (Addendum)
Again deteriorated despite sertraline 50mg  daily - will increase to 100mg  daily. Update with effect.

## 2021-08-21 NOTE — Assessment & Plan Note (Addendum)

## 2021-08-21 NOTE — Assessment & Plan Note (Signed)
Continue statin. 

## 2021-08-21 NOTE — Assessment & Plan Note (Signed)
Update renal function.  

## 2021-08-27 ENCOUNTER — Other Ambulatory Visit: Payer: Self-pay

## 2021-08-27 MED ORDER — FINASTERIDE 5 MG PO TABS: 5.0000 mg | ORAL_TABLET | Freq: Every day | ORAL | 3 refills | Status: DC

## 2021-08-27 NOTE — Telephone Encounter (Signed)
E-scribed refill 

## 2021-09-03 ENCOUNTER — Other Ambulatory Visit: Payer: Self-pay

## 2021-09-03 ENCOUNTER — Encounter: Payer: Self-pay | Admitting: Cardiology

## 2021-09-03 ENCOUNTER — Ambulatory Visit: Payer: Medicare Other | Admitting: Cardiology

## 2021-09-03 VITALS — BP 140/78 | HR 58 | Ht 73.0 in | Wt 183.4 lb

## 2021-09-03 DIAGNOSIS — R55 Syncope and collapse: Secondary | ICD-10-CM

## 2021-09-03 LAB — CUP PACEART INCLINIC DEVICE CHECK
Date Time Interrogation Session: 20221024142524
Implantable Pulse Generator Implant Date: 20191120

## 2021-09-03 NOTE — Progress Notes (Signed)
Electrophysiology Office Note   Date:  09/03/2021   ID:  Dylan [name], DOB Mar 12, 1934, MRN [id number]  PCP:  [name], MD  Cardiologist:  Bettina Gavia Primary Electrophysiologist:  Leiya Keesey [name], MD    No chief complaint on file.    History of Present Illness: Dylan [name] is a 87 y.o. male who is being seen today for the evaluation of syncope at the request of Shirlee More. Presenting today for electrophysiology evaluation.    He has a history of hyperlipidemia.  He was having episodes of lightheadedness but no syncope.  He did not have palpitations or chest pain.  He wore a 14-day monitor that showed evidence of symptomatic bradycardia.  He has had intermittent episodes of syncope over the last few years.  He is status post Linq monitor implant.  Today, denies symptoms of palpitations, chest pain, shortness of breath, orthopnea, PND, lower extremity edema, claudication, dizziness, presyncope, syncope, bleeding, or neurologic sequela. The patient is tolerating medications without difficulties.  Overall he is doing well.  He has no complaints.  No chest pain or shortness of breath.  He has not had any further episodes of syncope.Unfortunately he had a recent hospitalization due to C. difficile colitis.  He had no cardiac issues other than continuing his midodrine for his orthostatic hypotension.  Past Medical History:  Diagnosis Date   Basal cell carcinoma 08/31/2020   Mid forehead - ED&C    Basal cell carcinoma of right nasal sidewall 12/15/2014   Bilateral hearing loss 08/03/2018   S/p audiology evaluation, has new hearing aides.   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/2014   Mohs defect of eyelid 12/14/2014   Nuclear sclerosis, left 04/01/2017   Obstruction of right lacrimal duct 10/16/2015   PAD (peripheral artery disease) ([place]) 03/21/2020   Abnormal ABI, abnormal arterial duplex 03/2020:  R - moderate RLE disease, abnormal TBI L - WNL, abnormal TBI Referred for vascular consult - rec medical management   Pruritus 02/19/2020   Recurrent syncope 06/22/2018   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   Right leg swelling 02/19/2020   Sepsis ([place]) 05/30/2020   Thrombocytopenia ([place]) 02/21/2011   periph smear stable 02/2020   Vitamin B12 deficiency 08/03/2018   Completed b12 shots 09/2018, maintaining levels on oral replacement (dissolvable tablets) 01/2019   Vitamin D deficiency 05/19/2018   Past Surgical History:  Procedure Laterality Date   BASAL CELL CARCINOMA EXCISION Right 01/2015   with reconstructive surgery around eye   CATARACT EXTRACTION Left 03/2017   ETT  09/2018   negative for ischemia, extremely poor exercise tolerance ([place])   FOOT SURGERY Left 1998   metatarsal amputation after injury   KNEE ARTHROSCOPY  1993   Right   KNEE ARTHROSCOPY  01/2001   Left   LOOP RECORDER INSERTION N/A 09/30/2018   Procedure: LOOP RECORDER INSERTION;  Surgeon: Constance Haw, MD;  Location: [facility];  Service: Cardiovascular;  Laterality: N/A;     Current Outpatient Medications  Medication Sig Dispense Refill   Cholecalciferol (VITAMIN D3) 1000 units CAPS Take 1 capsule (1,000 Units total) by mouth daily. 30 capsule    feeding supplement (ENSURE ENLIVE / ENSURE PLUS) LIQD Take 237 mLs by mouth 2 (two) times daily between meals. 14220 mL 0    finasteride (PROSCAR) 5 MG tablet Take 1 tablet (5 mg total) by mouth daily. 90 tablet 3   memantine (NAMENDA XR) 14 MG CP24 24 hr capsule Take 1 capsule (14 mg total) by mouth daily. 90 capsule 3   midodrine (PROAMATINE) 5 MG tablet Take 1 tablet (5 mg total) by mouth 3 (three) times daily with meals. 90 tablet 11   sertraline (ZOLOFT) 100 MG tablet Take 1 tablet (100 mg total) by mouth daily. 90 tablet 3   vitamin B-12 (V-R VITAMIN B-12) 500 MCG tablet Take 1 tablet (500 mcg total) by mouth daily.     No current facility-administered medications for this visit.    Allergies:   Doxepin and Lincomycin hcl   Social History:  The patient  reports that he quit smoking about 60 years ago. His smoking use included cigarettes. He has never used smokeless tobacco. He reports that he does not drink alcohol and does not use drugs.   Family History:  The patient's family history includes Cataracts in his sister; Congestive Heart Failure in his father and mother; Leukemia in his brother; Stroke (age of onset: 46) in his father.   ROS:  Please see the history of present illness.   Otherwise, review of systems is positive for none.   All other systems are reviewed and negative.   PHYSICAL EXAM: VS:  BP 140/78   Pulse (!) 58   Ht 6\' 1"  (1.854 m)   Wt 183 lb 6.4 oz (83.2 kg)   SpO2 98%   BMI 24.20 kg/m  , BMI Body mass index is 24.2 kg/m. GEN: Well nourished, well developed, in no acute distress  HEENT: normal  Neck: no JVD, carotid bruits, or masses Cardiac: RRR; no murmurs, rubs, or gallops,no edema  Respiratory:  clear to auscultation bilaterally, normal work of breathing GI: soft, nontender, nondistended, + BS MS: no deformity or atrophy  Skin: warm and dry, device site well healed Neuro:  Strength and sensation are intact Psych: euthymic mood, full affect  EKG:  EKG is not ordered today. Personal review of the ekg ordered 06/23/21 shows sinus rhythm, right bundle branch block, left  anterior fascicular block  Personal review of the device interrogation today. Results in [place]: 05/04/2021: Magnesium 1.8 08/20/2021: ALT 26; BUN 19; Creatinine, Ser 1.30; Hemoglobin 16.2; Platelets 107.0; Potassium 4.3; Sodium 139    Lipid Panel     Component Value Date/Time   CHOL 210 (H) 08/20/2021 1117   TRIG 332.0 (H) 08/20/2021 1117   HDL 36.70 (L) 08/20/2021 1117   CHOLHDL 6 08/20/2021 1117   VLDL 66.4 (H) 08/20/2021 1117   LDLCALC 113 (H) 08/04/2019 1006   LDLDIRECT 140.0 08/20/2021 1117     Wt Readings from Last 3 Encounters:  09/03/21 183 lb 6.4 oz (83.2 kg)  08/20/21 183 lb 3 oz (83.1 kg)  08/02/21 182 lb 3.2 oz (82.6 kg)      Other studies Reviewed: Additional studies/ records that were reviewed today include: ZIO monitor 07/20/18 - personally reviewed Review of the above records today demonstrates:  297 runs of APCs longest lasting 11.2 seconds with a rate of 122 bpm the fastest 5 complexes a rate of 169 bpm.  There are no episodes of atrial fibrillation or flutter. There were no episodes of sinus node or AV nodal block There are no symptomatic events     ASSESSMENT AND PLAN:  1.  Syncope: Patient is status post Linq monitor.  He has a right bundle branch block but no obvious bradycardia that would cause syncope.  He has asymptomatic today.  His device has shown PACs and PVCs, but no daytime bradycardia.  Continue to monitor.   Current medicines are reviewed at length with the patient today.   The patient does not have concerns regarding his medicines.  The following changes were made today: None.  Labs/ tests ordered today include:  No orders of the defined types were placed in this encounter.   Disposition:   FU pending link monitor results months  Signed, Mac Dowdell [name], MD  09/03/2021 1:59 PM     [place] [address] [phone] (office) [phone] (fax)

## 2021-09-05 ENCOUNTER — Other Ambulatory Visit: Payer: Self-pay

## 2021-09-05 ENCOUNTER — Ambulatory Visit: Payer: Medicare Other | Admitting: Dermatology

## 2021-09-05 DIAGNOSIS — D229 Melanocytic nevi, unspecified: Secondary | ICD-10-CM | POA: Diagnosis not present

## 2021-09-05 DIAGNOSIS — L814 Other melanin hyperpigmentation: Secondary | ICD-10-CM | POA: Diagnosis not present

## 2021-09-05 DIAGNOSIS — L820 Inflamed seborrheic keratosis: Secondary | ICD-10-CM

## 2021-09-05 DIAGNOSIS — Z1283 Encounter for screening for malignant neoplasm of skin: Secondary | ICD-10-CM | POA: Diagnosis not present

## 2021-09-05 DIAGNOSIS — Z85828 Personal history of other malignant neoplasm of skin: Secondary | ICD-10-CM | POA: Diagnosis not present

## 2021-09-05 DIAGNOSIS — L821 Other seborrheic keratosis: Secondary | ICD-10-CM | POA: Diagnosis not present

## 2021-09-05 DIAGNOSIS — L570 Actinic keratosis: Secondary | ICD-10-CM

## 2021-09-05 DIAGNOSIS — D1800 Hemangioma unspecified site: Secondary | ICD-10-CM

## 2021-09-05 DIAGNOSIS — L578 Other skin changes due to chronic exposure to nonionizing radiation: Secondary | ICD-10-CM | POA: Diagnosis not present

## 2021-09-05 NOTE — Progress Notes (Signed)
Follow-Up Visit   Subjective  Dylan [name] is a 87 y.o. male who presents for the following: Total body skin exam (Hx of BCC mid forehead, R nasal sidewall), check scaly spots (Ears, wife thinks has been txted with LN2 in past), and check spots (Face, back). The patient presents for Total-Body Skin Exam (TBSE) for skin cancer screening and mole check.  Patient accompanied by wife who contributes to history.  The following portions of the chart were reviewed this encounter and updated as appropriate:   Tobacco  Allergies  Meds  Problems  Med Hx  Surg Hx  Fam Hx     Review of Systems:  No other skin or systemic complaints except as noted in HPI or Assessment and Plan.  Objective  Well appearing patient in no apparent distress; mood and affect are within normal limits.  A full examination was performed including scalp, head, eyes, ears, nose, lips, neck, chest, axillae, abdomen, back, buttocks, bilateral upper extremities, bilateral lower extremities, hands, feet, fingers, toes, fingernails, and toenails. All findings within normal limits unless otherwise noted below.  mid forehead, R nasal sidewall Well healed scars with no evidence of recurrence.   forehead x 3, R ear x 3, R forearm x 1 (7) Pink scaly macules   back x 1, L ear antihelix x 1, L ear concha x 1 Total = 3 (3) Erythematous keratotic or waxy stuck-on papule or plaque.    Assessment & Plan   Lentigines - Scattered tan macules - Due to sun exposure - Benign-appearing, observe - Recommend daily broad spectrum sunscreen SPF 30+ to sun-exposed areas, reapply every 2 hours as needed. - Call for any changes  Seborrheic Keratoses - Stuck-on, waxy, tan-brown papules and/or plaques  - Benign-appearing - Discussed benign etiology and prognosis. - Observe - Call for any changes  Melanocytic Nevi - Tan-brown and/or pink-flesh-colored symmetric macules and papules - Benign appearing on exam today -  Observation - Call clinic for new or changing moles - Recommend daily use of broad spectrum spf 30+ sunscreen to sun-exposed areas.   Hemangiomas - Red papules - Discussed benign nature - Observe - Call for any changes  Actinic Damage - Chronic condition, secondary to cumulative UV/sun exposure - diffuse scaly erythematous macules with underlying dyspigmentation - Recommend daily broad spectrum sunscreen SPF 30+ to sun-exposed areas, reapply every 2 hours as needed.  - Staying in the shade or wearing long sleeves, sun glasses (UVA+UVB protection) and wide brim hats (4-inch brim around the entire circumference of the hat) are also recommended for sun protection.  - Call for new or changing lesions.  Skin cancer screening performed today.  History of basal cell carcinoma (BCC) mid forehead, R nasal sidewall  Clear. Observe for recurrence. Call clinic for new or changing lesions.  Recommend regular skin exams, daily broad-spectrum spf 30+ sunscreen use, and photoprotection.    AK (actinic keratosis) (7) forehead x 3, R ear x 3, R forearm x 1  Destruction of lesion - forehead x 3, R ear x 3, R forearm x 1 Complexity: simple   Destruction method: cryotherapy   Informed consent: discussed and consent obtained   Timeout:  patient name, date of birth, surgical site, and procedure verified Lesion destroyed using liquid nitrogen: Yes   Region frozen until ice ball extended beyond lesion: Yes   Outcome: patient tolerated procedure well with no complications   Post-procedure details: wound care instructions given    Inflamed seborrheic keratosis back x 1,  L ear antihelix x 1, L ear concha x 1 Total = 3  Destruction of lesion - back x 1, L ear antihelix x 1, L ear concha x 1 Total = 3 Complexity: simple   Destruction method: cryotherapy   Informed consent: discussed and consent obtained   Timeout:  patient name, date of birth, surgical site, and procedure verified Lesion destroyed  using liquid nitrogen: Yes   Region frozen until ice ball extended beyond lesion: Yes   Outcome: patient tolerated procedure well with no complications   Post-procedure details: wound care instructions given    Skin cancer screening  Return in about 1 year (around 09/05/2022) for TBSE, Hx of BCC, Hx of AKs.  I, Othelia Pulling, RMA, am acting as scribe for Sarina Ser, MD . Documentation: I have reviewed the above documentation for accuracy and completeness, and I agree with the above.  Sarina Ser, MD

## 2021-09-05 NOTE — Patient Instructions (Signed)

## 2021-09-06 ENCOUNTER — Encounter: Payer: Self-pay | Admitting: Dermatology

## 2021-09-06 LAB — CUP PACEART REMOTE DEVICE CHECK
Date Time Interrogation Session: 20221027044015
Implantable Pulse Generator Implant Date: 20191120

## 2021-09-10 ENCOUNTER — Ambulatory Visit (INDEPENDENT_AMBULATORY_CARE_PROVIDER_SITE_OTHER): Payer: Medicare Other

## 2021-09-10 DIAGNOSIS — R55 Syncope and collapse: Secondary | ICD-10-CM

## 2021-09-18 NOTE — Progress Notes (Signed)
Carelink Summary Report / Loop Recorder 

## 2021-09-21 ENCOUNTER — Telehealth: Payer: Self-pay | Admitting: Family Medicine

## 2021-09-21 NOTE — Chronic Care Management (AMB) (Signed)
  Chronic Care Management   Note  09/21/2021 Name: AISON MALVEAUX MRN: [id number] DOB: Feb 05, 1934  MICKIE KOZIKOWSKI is a 87 y.o. year old male who is a primary care patient of [name], MD. I reached out to [name] by phone today in response to a referral sent by Mr. Santa Genera PCP, [name], MD.   Mr. Bartnik was given information about Chronic Care Management services today including:  CCM service includes personalized support from designated clinical staff supervised by his physician, including individualized plan of care and coordination with other care providers 24/7 contact phone numbers for assistance for urgent and routine care needs. Service will only be billed when office clinical staff spend 20 minutes or more in a month to coordinate care. Only one practitioner may furnish and bill the service in a calendar month. The patient may stop CCM services at any time (effective at the end of the month) by phone call to the office staff.   [name]/SPOUSE verbally agreed to assistance and services provided by embedded care coordination/care management team today.  Follow up plan: Tatjana Secretary/administrator

## 2021-09-25 ENCOUNTER — Telehealth: Payer: Self-pay

## 2021-09-25 NOTE — Telephone Encounter (Signed)
ILR alert for AF, EGM shows AFlutter with controlled rates, onset 09/24/21 @ 2246 (ongoing with presenting rhythm this AM 0450 same). Previous "AF" episodes logged have been documented as SR with PAC's. This appears to new onset. Routing for further review.  Successful telephone encounter to patient's wife as patient has dementia. Wife states patient has not complained of symptoms. Per wife dementia is significant. Discuss with Dr. Curt Bears. Suggest appointment with him or EP App to discuss risk and benefits of [place] with family. Discussed with wife. Wife prefers appointment with Dr. Curt Bears if reasonably soon. Will send to scheduling.      Current EGM

## 2021-10-07 NOTE — Progress Notes (Signed)
Cardiology Office Note Date:  10/07/2021  Patient ID:  Dylan [name], Dylan [name] 04/15/1934, MRN [id number] PCP:  Dylan Bush, MD  Cardiologist:  Dr. Bettina [name] EP: Dr. Curt [name]   Chief Complaint:  hospital follow up  History of Present Illness: Dylan [name] is a 87 y.o. male with history of HLD, dementia, CKD (III), RBBB and syncope > ILR implant  He was admitted to [facility] 05/31/20 with acute onset of worsening generalized weakness with associated mild dyspnea. In the ER, VSS WBC 23.1, lactic acid 3.9, mild bump in Creat 1.59 (baselin 1.18) Started on IFV and antibiotics Suspect pneumonia Discharged on PO antibiotics and started on midodrine as well Discharged 06/02/20.  He comes today to be seen for Dr. Curt [name], last seen by him 09/03/21.  He had a recent hospitalization for C-Diff, no cardiac issues, remained on midodrine for his orthostatic hypotension, no recurrent syncope. No changes were made.      09/25/21, ILR noted Aflutter w/CVR and planned to come in to discuss [place].  TODAY He is accompanied by his wife. He has known baseline dementia and defers to his wife for answers.  She mentions he sleep poorly and tends to wander during the night He though denies any kind oc CP or SOB Today's BP is described as great for him He has not had recurrent syncope He will sometimes stumble because he likes his shoes tied loose, though does not fall.  His wife tries to keep them tied tight, No hx of bleeding or hematology history   Device information MDT LNQII  Implanted 09/30/2018 for syncope  Past Medical History:  Diagnosis Date   Basal cell carcinoma 08/31/2020   Mid forehead - ED&C    Basal cell carcinoma of right nasal sidewall 12/15/2014   Bilateral hearing loss 08/03/2018   S/p audiology evaluation, has new hearing aides.   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/2014   Mohs defect of eyelid 12/14/2014   Nuclear sclerosis, left 04/01/2017   Obstruction of right lacrimal duct 10/16/2015   PAD (peripheral artery disease) ([place]) 03/21/2020   Abnormal ABI, abnormal arterial duplex 03/2020:  R - moderate RLE disease, abnormal TBI L - WNL, abnormal TBI Referred for vascular consult - rec medical management   Pruritus 02/19/2020   Recurrent syncope 06/22/2018   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   Right leg swelling 02/19/2020   Sepsis ([place]) 05/30/2020   Thrombocytopenia ([place]) 02/21/2011   periph smear stable 02/2020   Vitamin B12 deficiency 08/03/2018   Completed b12 shots 09/2018, maintaining levels on oral replacement (dissolvable tablets) 01/2019   Vitamin D deficiency 05/19/2018    Past Surgical History:  Procedure Laterality Date   BASAL CELL CARCINOMA EXCISION Right 01/2015   with reconstructive surgery around eye   CATARACT EXTRACTION Left 03/2017   ETT  09/2018   negative for ischemia, extremely poor exercise tolerance ([place])  FOOT SURGERY Left 1998   metatarsal amputation after injury   KNEE ARTHROSCOPY  1993   Right   KNEE ARTHROSCOPY  01/2001   Left   LOOP RECORDER INSERTION N/A 09/30/2018   Procedure: LOOP RECORDER INSERTION;  Surgeon: Dylan Haw, MD;  Location: [facility];  Service: Cardiovascular;  Laterality: N/A;    Current Outpatient Medications  Medication Sig Dispense Refill   Cholecalciferol (VITAMIN D3) 1000 units CAPS Take 1  capsule (1,000 Units total) by mouth daily. 30 capsule    feeding supplement (ENSURE ENLIVE / ENSURE PLUS) LIQD Take 237 mLs by mouth 2 (two) times daily between meals. 14220 mL 0   finasteride (PROSCAR) 5 MG tablet Take 1 tablet (5 mg total) by mouth daily. 90 tablet 3   memantine (NAMENDA XR) 14 MG CP24 24 hr capsule Take 1 capsule (14 mg total) by mouth daily. 90 capsule 3   sertraline (ZOLOFT) 100 MG tablet Take 1 tablet (100 mg total) by mouth daily. 90 tablet 3   vitamin B-12 (V-R VITAMIN B-12) 500 MCG tablet Take 1 tablet (500 mcg total) by mouth daily.     No current facility-administered medications for this visit.    Allergies:   Doxepin and Lincomycin hcl   Social History:  The patient  reports that he quit smoking about 60 years ago. His smoking use included cigarettes. He has never used smokeless tobacco. He reports that he does not drink alcohol and does not use drugs.   Family History:  The patient's family history includes Cataracts in his sister; Congestive Heart Failure in his father and mother; Leukemia in his brother; Stroke (age of onset: 82) in his father.  ROS:  Please see the history of present illness. All other systems are reviewed and otherwise negative.   PHYSICAL EXAM:  VS:  There were no vitals taken for this visit. BMI: There is no height or weight on file to calculate BMI. Well nourished, well developed, in no acute distress  HEENT: normocephalic, atraumatic  Neck: no JVD, carotid bruits or masses Cardiac:  irreg-irreg; no significant murmurs, no rubs, or gallops Lungs:   CTA b/l, no wheezing, rhonchi or rales  Abd: soft, nontender MS: no deformity or atrophy Ext: no edema  Skin: warm and dry, no rash Neuro:  No gross deficits appreciated Psych: euthymic mood, full affect   ILR site is stable, no tethering or discomfort   EKG:  done today and reviewed by myself AFlutter (typical), RBBB, LAD  ILR interrogation done today and reviewed by  myself: Battery status is good R waves 0.23 Presents in rate controlled Aflutter Burden is unclear, known hx of false AFib episodes with PACs   09/04/2018: ETT Blood pressure demonstrated a normal response to exercise. Clinically and electrically negative for ischemia Extremelly poor exercise tolerance Dr. Curt [name] commented:  Poor exercise tolerance.  No evidence of chronotropic incompetence.  Will need Linq monitoring for episode of syncope.   14 day monitor Aug 2019  Indication, syncope   Baseline rhythm, sinus minimum maximum and average rate 44 136 and 64 bpm   Supraventricular ectopy, less than 1% there were frequent 297 runs of APCs longest lasting 11.2 seconds with a rate of 122 bpm the fastest 5 complexes a rate of 169 bpm.  There are no episodes of atrial fibrillation or flutter.   Ventricular ectopy, rare isolated APCs 5 couplets and one triplet less than 1% burden. There were no episodes of sinus node or AV  nodal block There are no symptomatic events Conclusion brief runs of atrial premature contractions   Recent Labs: 05/04/2021: Magnesium 1.8 08/20/2021: ALT 26; BUN 19; Creatinine, Ser 1.30; Hemoglobin 16.2; Platelets 107.0; Potassium 4.3; Sodium 139  08/20/2021: Cholesterol 210; Direct LDL 140.0; HDL 36.70; Total CHOL/HDL Ratio 6; Triglycerides 332.0; VLDL 66.4   CrCl cannot be calculated (Patient's most recent lab result is older than the maximum 21 days allowed.).   Wt Readings from Last 3 Encounters:  09/03/21 183 lb 6.4 oz (83.2 kg)  08/20/21 183 lb 3 oz (83.1 kg)  08/02/21 182 lb 3.2 oz (82.6 kg)     Other studies reviewed: Additional studies/records reviewed today include: summarized above  ASSESSMENT AND PLAN:  1. Syncope     Baseline conduction system disease, RBBB, LAD     No brady or pause episodes  2. New Aflutter [id number] is 2 uncertain % burden  Discussed with the patient and his wife AFlutter and his stroke risk associated with  it This is new for him, will check an echo, start eliquis 5mg  BID Discussed risks benefits of [place], they are agreeable to proceed Discussed monitoring for bleeding and signs of bleeding Discussed treatment strategies, is a typical flutter though she would really prefer to avoid procedures  Will have him back in [place], plan DCCV if still in flutter   Disposition: as above   Current medicines are reviewed at length with the patient today.  The patient did not have any concerns regarding medicines.  Venetia Night, PA-C 10/07/2021 10:27 AM     [place] [address] [phone] (office)  [phone] (fax)

## 2021-10-08 ENCOUNTER — Other Ambulatory Visit: Payer: Self-pay

## 2021-10-08 ENCOUNTER — Ambulatory Visit: Payer: Medicare Other | Admitting: Physician Assistant

## 2021-10-08 ENCOUNTER — Encounter: Payer: Self-pay | Admitting: Physician Assistant

## 2021-10-08 VITALS — BP 120/74 | HR 65 | Ht 72.0 in | Wt 181.0 lb

## 2021-10-08 DIAGNOSIS — Z4509 Encounter for adjustment and management of other cardiac device: Secondary | ICD-10-CM

## 2021-10-08 DIAGNOSIS — R55 Syncope and collapse: Secondary | ICD-10-CM | POA: Diagnosis not present

## 2021-10-08 DIAGNOSIS — I483 Typical atrial flutter: Secondary | ICD-10-CM | POA: Diagnosis not present

## 2021-10-08 LAB — CUP PACEART REMOTE DEVICE CHECK
Date Time Interrogation Session: 20221127034545
Implantable Pulse Generator Implant Date: 20191120

## 2021-10-08 LAB — CUP PACEART INCLINIC DEVICE CHECK
Date Time Interrogation Session: 20221128181947
Implantable Pulse Generator Implant Date: 20191120

## 2021-10-08 MED ORDER — APIXABAN 5 MG PO TABS: 5.0000 mg | ORAL_TABLET | Freq: Two times a day (BID) | ORAL | 1 refills | Status: DC

## 2021-10-08 NOTE — Patient Instructions (Signed)
Medication Instructions:    START TAKING ELIQUIS 5 MG TWICE A DAY    *If you need a refill on your cardiac medications before your next appointment, please call your pharmacy*   Lab Work:  NONE ORDERED  TODAY   If you have labs (blood work) drawn today and your tests are completely normal, you will receive your results only by: [place] (if you have MyChart) OR A paper copy in the mail If you have any lab test that is abnormal or we need to change your treatment, we will call you to review the results.   Testing/Procedures: Your physician has requested that you have an echocardiogram. Echocardiography is a painless test that uses sound waves to create images of your heart. It provides your doctor with information about the size and shape of your heart and how well your heart's chambers and valves are working. This procedure takes approximately one hour. There are no restrictions for this procedure.    Follow-Up: At [facility], you and your health needs are our priority.  As part of our continuing mission to provide you with exceptional heart care, we have created designated Provider Care Teams.  These Care Teams include your primary Cardiologist (physician) and Advanced Practice Providers (APPs -  Physician Assistants and Nurse Practitioners) who all work together to provide you with the care you need, when you need it.  We recommend signing up for the patient portal called "MyChart".  Sign up information is provided on this After Visit Summary.  MyChart is used to connect with patients for Virtual Visits (Telemedicine).  Patients are able to view lab/test results, encounter notes, upcoming appointments, etc.  Non-urgent messages can be sent to your provider as well.   To learn more about what you can do with MyChart, go to [email or web address].    Your next appointment:   1 month(s)  The format for your next appointment:   In Person  Provider:   You will see one of  the following Advanced Practice Providers on your designated Care Team:   [name], PA-C  :1}    Other Instructions

## 2021-10-11 ENCOUNTER — Telehealth: Payer: Self-pay

## 2021-10-11 ENCOUNTER — Ambulatory Visit (INDEPENDENT_AMBULATORY_CARE_PROVIDER_SITE_OTHER): Payer: Medicare Other

## 2021-10-11 DIAGNOSIS — R55 Syncope and collapse: Secondary | ICD-10-CM

## 2021-10-11 NOTE — Progress Notes (Signed)
Chronic Care Management Pharmacy Assistant   Name: Dylan [name]  MRN: [id number] DOB: Jan 27, 1934  Reason for Encounter: CCM (Inital Questions)   Recent office visits:  08/20/2021 - [name], MD - Patient presented for Annual Wellness Visit. Labs: LDL cholesterol, CMC, CMP, Lipid, Microalbumin, Vit B 12, Vit D and  DG Esophagus w double cm. Change: sertraline (ZOLOFT) 100 MG tablet Take 1 tablet (100 mg total) by mouth daily verses 50 mg daily. Stopped due to completed course: nystatin (MYCOSTATIN) 100000 UNIT/ML suspension.  07/03/2021 - [name], MD - Patient presented for hospital follow up. Labs: BMP and CBC. Start: nystatin (MYCOSTATIN) 100000 UNIT/ML suspension due to possible Thrush.  06/07/2021 - [name], MD - Orders only - Start: vancomycin (VANCOCIN) 125 MG capsule for 10 days.  06/04/2021 - [facility] - Patient presented for US Scrotum w/doppler. 06/04/2021 - [name], MD - Patient presented for watery diarrhea. Labs: Urinalysis and US Scrotum w/doppler. Referral to Cardiology. Patient stopped Miralax and Senakot due to watery diarrhea. Exam with swollen tender left testicle US Scrotum w/doppler ordered.  05/15/2021 - [name], MD - Patient presented for Dementia without behavioral disturbance ([place]), CKD (chronic kidney disease) stage 3, Pre-syncope - Primary and Constipation. Change: decrease miralax to 8.5gm/day and senokot to 1 tab daily does not state previous dosages.   Recent consult visits:  10/08/2021 - [name], PA - Cardiology - Patient presented for typical atrial flutter. Orders: CUP Paceart in-clinic device check and EKG. Start: apixaban (ELIQUIS) 5 MG TABS tablet due to new Aflutter and stroke risk. Follow up in 1 month.  09/25/2021 - Trish Fountain, RN - Cardiology - Telephone - Patient was called due to ILR alert for AF, EGM shows AFlutter with controlled rates, onset 09/24/21 @ 2246 . Appointment with Dr. Curt Bears  scheduled.  09/05/2021 - Sarina Ser, MD - Dermatology - Patient presented for total body skin exam. Procedure: destruction of Actinic Keratosis - Forehead x3, R ear x3 and right forearm x1.  09/03/2021 - Allegra Lai, MD - Cardiology - Patient presented for for electrophysiology evaluation. No medication changes.  08/02/2021 - Laurann Montana, NP - Cardiology - Patient presented for Bradycardia follow-up. No medication changes.   05/07/2021 - Will Camnitz - Cardiology - Patient presented for syncope and collapse. Procedures: HC REM INTERROG ICPMS/SCRMS <30 D TECH REVIEW and PR REM INTERROG SCRMS <30 D PHYS/QHP. No other information  Hospital visits:  Medication Reconciliation was completed by comparing discharge summary, patient's EMR and Pharmacy list, and upon discussion with patient.  Admitted to the hospital on 05/04/2021 due to recurrent syncope on midodrine who comes in for possible syncope. Discharge date was 02/03/2021. Discharged from [facility].    Patient arrived by EMS from home where he lives with spouse. Wife called EMS due to episode of shaking, diaphoresis, and cool to touch. Denies LOC. Patient denies pain.   New?Medications Started at [facility] Discharge:?? -started amoxicillin-clavulanate (AUGMENTIN) 875-125 MG tablet  -started feeding supplement (ENSURE ENLIVE / ENSURE PLUS) LIQD -started finasteride (PROSCAR) 5 MG tablet -started polyethylene glycol (MIRALAX / GLYCOLAX) 17 g packet -started senna (SENOKOT) 8.6 MG TABS tablet  Medication Changes at Hospital Discharge: -No changes noted  Medications Discontinued at Hospital Discharge: -No changes noted  Medications that remain the same after Hospital Discharge:??  -All other medications will remain the same.     Medication Reconciliation was completed by comparing discharge summary, patient's EMR and Pharmacy list, and upon discussion with patient.  Admitted to the hospital on 06/23/2021 due to  Loss of Consciousness. Discharge date was 06/28/2021. Discharged from [facility].    Possible syncopal episode. Patient reported his eyes glazing over and he slumped down on the shower chair.  No seizure activity.  He regained consciousness after 1-2 minutes and was at his behavioral baseline. 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  New?Medications Started at [facility] Discharge:?? -started fidaxomicin (DIFICID) 200 MG TABS tablet for 8 days -started metroNIDAZOLE (FLAGYL) 500 MG tablet for 8 days -started acidophilus (no dosage given) for 10 days  Medication Changes at Hospital Discharge: -Changed midodrine 5 MG tablet - Take 1 tablet (5 mg total) by mouth 3 (three) times daily with meals. Changed strength and how much to take (no details given)  Medications Discontinued at Hospital Discharge: -Stopped polyethylene glycol 17 g packet (MIRALAX / GLYCOLAX) -Stopped senna 8.6 MG Tabs tablet (SENOKOT)  Medications that remain the same after Hospital Discharge:??  -All other medications will remain the same.    Medications: Outpatient Encounter Medications as of 10/11/2021  Medication Sig   apixaban (ELIQUIS) 5 MG TABS tablet Take 1 tablet (5 mg total) by mouth 2 (two) times daily.   Cholecalciferol (VITAMIN D3) 1000 units CAPS Take 1 capsule (1,000 Units total) by mouth daily.   feeding supplement (ENSURE ENLIVE / ENSURE PLUS) LIQD Take 237 mLs by mouth 2 (two) times daily between meals.   finasteride (PROSCAR) 5 MG tablet Take 1 tablet (5 mg total) by mouth daily.   memantine (NAMENDA XR) 14 MG CP24 24 hr capsule Take 1 capsule (14 mg total) by mouth daily.   midodrine (PROAMATINE) 5 MG tablet Take 5 mg by mouth 3 (three) times daily.   sertraline (ZOLOFT) 100 MG tablet Take 1 tablet (100 mg total) by mouth daily.   vitamin B-12 (V-R VITAMIN B-12) 500 MCG tablet Take 1 tablet (500 mcg total) by mouth daily.   No  facility-administered encounter medications on file as of 10/11/2021.   Lab Results  Component Value Date/Time   MICROALBUR 2.1 (H) 08/20/2021 11:22 AM    BP Readings from Last 3 Encounters:  10/08/21 120/74  09/03/21 140/78  08/20/21 118/66   Patient contacted to review initial questions prior to visit with [name]. Spoke with patient's wife [facility]) for the encounter as patient has Dementia.   Have you seen any other providers since your last visit with PCP? Yes  Any changes in your medications or health? Yes  Change: sertraline (ZOLOFT) 100 MG tablet Take 1 tablet (100 mg total) by mouth daily verses 50 mg daily. Start: apixaban (ELIQUIS) 5 MG TABS tablet due to new Aflutter & stroke risk.  Any side effects from any medications? No  Do you have an symptoms or problems not managed by your medications? No  Any concerns about your health right now? Yes - A-FIB diagnoses. Patient's wife stated that is very scary to her right now.   Has your provider asked that you check blood pressure, blood sugar, or follow special diet at home? Yes Patient's wife stated she was instructed to take it if he was feeling bad. I asked for his blood pressure to be taken starting today until [name] calls to get the log. Patient's wife agreed and voiced understanding.   Do you get any type of exercise on a regular basis? Yes - He will walk when his wife takes him to work. Patient's wife does not like  for him to sit around.   Can you think of a goal you would like to reach for your health? Yes - Just to stay healthy. Patient's wife states it is all the can do with his Dementia.   Do you have any problems getting your medications? No  Is there anything that you would like to discuss during the appointment? No  Spoke with patient and reminded them to have all medications, supplements and any blood glucose and blood pressure readings available for review with pharmacist, at their  telephone visit on 10/15/2021 at 11:00.    Star Rating Drugs:  Medication:  Last Fill: Day Supply No star medications noted  Care Gaps: Annual wellness visit in last year? Yes 08/20/2021 Most Recent BP reading: 120/74 on 10/08/2021  [name], CPP notified  Marijean Niemann, [place] Clinical Pharmacy Assistant [phone]  Time Spent: 62 Minutes

## 2021-10-15 ENCOUNTER — Other Ambulatory Visit: Payer: Self-pay

## 2021-10-15 ENCOUNTER — Ambulatory Visit: Payer: Medicare Other | Admitting: Pharmacist

## 2021-10-15 DIAGNOSIS — I951 Orthostatic hypotension: Secondary | ICD-10-CM

## 2021-10-15 DIAGNOSIS — F03918 Unspecified dementia, unspecified severity, with other behavioral disturbance: Secondary | ICD-10-CM

## 2021-10-15 DIAGNOSIS — K5900 Constipation, unspecified: Secondary | ICD-10-CM

## 2021-10-15 DIAGNOSIS — E785 Hyperlipidemia, unspecified: Secondary | ICD-10-CM

## 2021-10-15 DIAGNOSIS — N1831 Chronic kidney disease, stage 3a: Secondary | ICD-10-CM

## 2021-10-15 DIAGNOSIS — G301 Alzheimer's disease with late onset: Secondary | ICD-10-CM

## 2021-10-15 NOTE — Patient Instructions (Signed)
Visit Information  Phone number for Pharmacist: [phone]  Thank you for meeting with me to discuss your medications! I look forward to working with you to achieve your health care goals. Below is a summary of what we talked about during the visit:   Goals Addressed             This Visit's Progress    Track and Manage My Blood Pressure-Hypertension       Timeframe:  Long-Range Goal Priority:  High Start Date:  10/15/21                            Expected End Date:      10/15/22                 Follow Up Date June 2023   - check blood pressure 3 times per week - choose a place to take my blood pressure (home, clinic or office, retail store) - write blood pressure results in a log or diary    Why is this important?   You won't feel high blood pressure, but it can still hurt your blood vessels.  High blood pressure can cause heart or kidney problems. It can also cause a stroke.  Making lifestyle changes like losing a little weight or eating less salt will help.  Checking your blood pressure at home and at different times of the day can help to control blood pressure.  If the doctor prescribes medicine remember to take it the way the doctor ordered.  Call the office if you cannot afford the medicine or if there are questions about it.     Notes:         Care Plan : [place]  Updates made by [name], [name] since 10/15/2021 12:00 AM     Problem: Hyperlipidemia, Atrial Fibrillation, and BPH, Dementia, Orthostatic hypotension   Priority: High     Long-Range Goal: Disease mgmt   Start Date: 10/15/2021  Expected End Date: 10/15/2022  This Visit's Progress: On track  Priority: High  Note:   Current Barriers:  Unable to independently monitor therapeutic efficacy  Pharmacist Clinical Goal(s):  Patient will achieve adherence to monitoring guidelines and medication adherence to achieve therapeutic efficacy through collaboration with PharmD and  provider.   Interventions: 1:1 collaboration with [name], MD regarding development and update of comprehensive plan of care as evidenced by provider attestation and co-signature Inter-disciplinary care team collaboration (see longitudinal plan of care) Comprehensive medication review performed; medication list updated in electronic medical record  Hyperlipidemia: (LDL goal < 100) -Not ideally controlled - LDL is above goal; given dementia and comorbidities -Hx PAD. No statin recommended per PCP 08/2021 -Current treatment: None -Medications previously tried: n/a  -Educated on Cholesterol goals; Importance of limiting foods high in cholesterol; -Counseled on diet and exercise extensively  Atrial Flutter (Goal: prevent stroke and major bleeding) -Controlled - caregiver reports compliance with Eliquis; denies issue so far -CHADSVASC: 2 -Current treatment: Rate control: None Anticoagulation: Eliquis 5 mg BID -Home BP and HR readings: 116/67, 77; 107/59, 84; 136/75, 99; 114/81, 86  -Counseled on increased risk of stroke due to Afib and benefits of anticoagulation for stroke prevention; importance of adherence to anticoagulant exactly as prescribed; bleeding risk associated with Eliquis and importance of self-monitoring for signs/symptoms of bleeding; avoidance of NSAIDs due to increased bleeding risk with anticoagulants; seeking medical attention after a head injury or if  there is blood in the urine/stool; -Recommended to continue current medication  Orthostatic hypotension (Goal: maintain BP 100/60-130/80) -Controlled - caregiver reports compliance with AM and HS doses of midodrine, does occasionally miss midday dose; overall no recent issues with presyncope -Hx recurrent syncope -Hx CKD stage 3a -Current treatment  Midodrine 5 mg TID -Counseled on BP goals; importance of hydration to maintain BP and prevent kidney impairment -Recommended to continue current  medication  Dementia w/ behavioral disturbance (Goal: slow progression) -Controlled - caregiver reports compliance with medications; she has noticed improvement in "inhibitions" since increasing sertraline to 100 mg -Pt has previously declined neurology evaluation -Current treatment  Memantine XR 14 mg daily Sertraline 100 mg daily -Medications previously tried: donepezil  -Counseled on benefits of memantine for slowing progression of disease; discussed she is unlikely to see benefit on day-to-day basis -Pt is at elevated risk for bleeding given SSRI + Eliquis, discussed risks at length; benefits of sertraline outweigh risks currently as pt could not be in public before being on sertraline -Recommended to continue current medication  Urinary incontinence (Goal: manage symptoms) -Controlled - caregiver reports urinary habits are improved since starting finasteride -CT scan 04/2021 showed mild prostate enlargement -Current treatment  Finasteride 5 mg daily -Counseled on enlarged prostate and benefits of finasteride -Recommended to continue current medication  Health Maintenance -Vaccine gaps: Prevnar, Shingrix, Covid booster, TDAP -Caregiver reports he had Covid booster 08/20/21 at pharmacy, same day as flu shot. Updated chart. -Hx recurrent Cdiff, chronic constipation -Current therapy:  Vitamin D 1000 IU Vitamin B12 500 mcg Boost HP nutrition supplement -once day  Align AM Senokot PM Miralax w/ coffee -Patient is satisfied with current therapy and denies issues -Recommended to continue current medication  Patient Goals/Self-Care Activities Patient will:  - take medications as prescribed as evidenced by patient report and record review focus on medication adherence by pill box check blood pressure daily, document, and provide at future appointments      Mr. Bi was given information about Chronic Care Management services today including:  CCM service includes personalized  support from designated clinical staff supervised by his physician, including individualized plan of care and coordination with other care providers 24/7 contact phone numbers for assistance for urgent and routine care needs. Standard insurance, coinsurance, copays and deductibles apply for chronic care management only during months in which we provide at least 20 minutes of these services. Most insurances cover these services at 100%, however patients may be responsible for any copay, coinsurance and/or deductible if applicable. This service may help you avoid the need for more expensive face-to-face services. Only one practitioner may furnish and bill the service in a calendar month. The patient may stop CCM services at any time (effective at the end of the month) by phone call to the office staff.  Patient agreed to services and verbal consent obtained.   Patient verbalizes understanding of instructions provided today and agrees to view in [place].  Telephone follow up appointment with pharmacy team member scheduled for: 6 months  [name], PharmD, [facility] Clinical Pharmacist [facility] [phone]

## 2021-10-15 NOTE — Progress Notes (Signed)
Chronic Care Management Pharmacy Note  10/15/2021 Name:  Dylan [name] MRN:  [id number] DOB:  [birth year]  Summary: -Patient's wife reports compliance with new Eliquis as prescribed, denies bleeding issues. His bleeding risk is slightly higher than normal due to concomitant SSRI. Counseled on s/sx of bleeding, when to seek emergency care -Patient's wife reports improvement in "inhibitions" with higher dose of sertraline  Recommendations/Changes made from today's visit: -Advised to avoid NSAIDs -No med changes  Plan: -[place] Dylan call patient in 3 months for BP check -Pharmacist follow up televisit scheduled for 6 months   Subjective: Dylan [name] is an 87 y.o. year old male who is a primary patient of Dylan Bush, MD.  The CCM team was consulted for assistance with disease management and care coordination needs.    Engaged with patient by telephone for initial visit in response to provider referral for pharmacy case management and/or care coordination services.   Consent to Services:  The patient was given the following information about Chronic Care Management services today, agreed to services, and gave verbal consent: 1. CCM service includes personalized support from designated clinical staff supervised by the primary care provider, including individualized plan of care and coordination with other care providers 2. 24/7 contact phone numbers for assistance for urgent and routine care needs. 3. Service Dylan only be billed when office clinical staff spend 20 minutes or more in a month to coordinate care. 4. Only one practitioner may furnish and bill the service in a calendar month. 5.The patient may stop CCM services at any time (effective at the end of the month) by phone call to the office staff. 6. The patient Dylan be responsible for cost sharing (co-pay) of up to 20% of the service fee (after annual deductible is met). Patient agreed to services and consent  obtained.  Patient Care Team: Dylan Bush, MD as PCP - General (Family Medicine) Dylan Haw, MD as PCP - Electrophysiology (Cardiology) Dylan Harp, MD as PCP - Cardiology (Cardiology) Dylan [name], [facility] as Pharmacist (Pharmacist)  Patient has severe dementia and his wife Dylan [name] is his caregiver. 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   Recent office visits: 08/20/2021 - Dylan Bush, MD - Patient presented for Annual Wellness Visit. Labs: LDL cholesterol, CMC, CMP, Lipid, Microalbumin, Vit B 12, Vit D and  DG Esophagus w double cm. Change: increase sertraline (ZOLOFT) 100 MG tablet (sexual disinhibition). F/u 4 months.   07/03/2021 - Dylan Bush, MD - Patient presented for hospital follow up. Labs: BMP and CBC. Start: nystatin (MYCOSTATIN) 100000 UNIT/ML suspension due to possible Thrush.   06/07/2021 - Dylan Bush, MD - Orders only - Start: vancomycin (VANCOCIN) 125 MG capsule for 10 days.  06/04/2021 - [facility] - Patient presented for US Scrotum w/doppler. 06/04/2021 - Dylan Bush, MD - Patient presented for watery diarrhea. Labs: Urinalysis and US Scrotum w/doppler. Referral to Cardiology. Patient stopped Miralax and Senakot due to watery diarrhea. Exam with swollen tender left testicle US Scrotum w/doppler ordered.   05/15/2021 - Dylan Bush, MD - Patient presented for Dementia without behavioral disturbance ([place]), CKD (chronic kidney disease) stage 3, Pre-syncope - Primary and Constipation. Change: decrease miralax to 8.5gm/day and senokot to 1 tab daily does not state previous dosages.   Recent consult visits: 10/08/2021 - Dylan Standard, PA - Cardiology - New dx Aflutter.  Chadsvasc=2. Order ECHO. Start: apixaban (ELIQUIS) 5 MG TABS tablet due to new Aflutter and stroke risk. Follow up in 1 month.  09/25/2021 - Dylan Fountain, RN -  Cardiology - Telephone - Patient was called due to ILR alert for AF, EGM shows AFlutter with controlled rates, onset 09/24/21 @ 2246 . Appointment with Dr. Curt [name] scheduled.  09/05/2021 - Sarina Ser, MD - Dermatology - Patient presented for total body skin exam. Procedure: destruction of Actinic Keratosis - Forehead x3, R ear x3 and right forearm x1.  09/03/2021 - Dylan Lai, MD - Cardiology - Patient presented for for electrophysiology evaluation. No medication changes.  08/02/2021 - Dylan Montana, NP - Cardiology - Patient presented for Bradycardia follow-up. No medication changes.   05/07/2021 - Dylan [name] - Cardiology - Patient presented for syncope and collapse. Procedures: HC REM INTERROG ICPMS/SCRMS <30 D TECH REVIEW and PR REM INTERROG SCRMS <30 D PHYS/QHP. No other information  Hospital visits: Medication Reconciliation was completed by comparing discharge summary, patient's EMR and Pharmacy list, and upon discussion with patient.   Admitted to the hospital on 05/04/2021 due to Recurrent Syncope, Dehydration. Discharge date was 05/06/2021. Discharged from [facility].   -near syncope likely 2/2 constipation, rectal impaction. Improved with bowel regimen and IV fluids.   New?Medications Started at [facility] Discharge:?? -started amoxicillin-clavulanate (AUGMENTIN) 875-125 MG tablet  -started feeding supplement (ENSURE ENLIVE / ENSURE PLUS) LIQD -started finasteride (PROSCAR) 5 MG tablet -started polyethylene glycol (MIRALAX / GLYCOLAX) 17 g packet -started senna (SENOKOT) 8.6 MG TABS tablet   Medications that remain the same after Hospital Discharge:??  -All other medications Dylan remain the same.     Admitted to the hospital on 06/23/2021 due to Acute colitis/C.Diff. Discharge date was 06/28/2021. Discharged from [place]?Medications Started at [facility] Discharge:?? -started fidaxomicin (DIFICID) 200 MG TABS tablet for 8  days -started metroNIDAZOLE (FLAGYL) 500 MG tablet for 8 days -started acidophilus for 10 days  Medications Discontinued at Hospital Discharge: -Stopped polyethylene glycol 17 g packet (MIRALAX / GLYCOLAX) -Stopped senna 8.6 MG Tabs tablet (SENOKOT)   Medications that remain the same after Hospital Discharge:??  -All other medications Dylan remain the same.     Objective:  Lab Results  Component Value Date   CREATININE 1.30 08/20/2021   BUN 19 08/20/2021   GFR 50.09 (L) 08/20/2021   GFRNONAA 57 (L) 06/28/2021   GFRAA >60 06/02/2020   NA 139 08/20/2021   K 4.3 08/20/2021   CALCIUM 9.6 08/20/2021   CO2 31 08/20/2021   GLUCOSE 75 08/20/2021    Lab Results  Component Value Date/Time   GFR 50.09 (L) 08/20/2021 11:17 AM   GFR 52.05 (L) 07/03/2021 12:41 PM   MICROALBUR 2.1 (H) 08/20/2021 11:22 AM    Last diabetic Eye exam: No results found for: HMDIABEYEEXA  Last diabetic Foot exam: No results found for: HMDIABFOOTEX   Lab Results  Component Value Date   CHOL 210 (H) 08/20/2021   HDL 36.70 (L) 08/20/2021   LDLCALC 113 (H) 08/04/2019   LDLDIRECT 140.0 08/20/2021   TRIG 332.0 (H) 08/20/2021   CHOLHDL 6 08/20/2021    Hepatic Function Latest Ref Rng & Units 08/20/2021 06/24/2021 05/04/2021  Total Protein 6.0 - 8.3 g/dL 7.0 5.4(L) 7.1  Albumin 3.5 - 5.2 g/dL 4.4 3.0(L) 4.2  AST 0 - 37 U/L _0 ALT 0 - 53 U/L _1 Alk Phosphatase 39 - 117 U/L 82 51 70  Total Bilirubin 0.2 - 1.2 mg/dL 0.6 1.2 1.2  Bilirubin, Direct 0.0 - 0.3 mg/dL - - -    Lab Results  Component Value Date/Time   TSH 1.45 08/09/2020 11:53 AM   TSH 1.56 02/22/2020 10:34 AM    CBC Latest Ref Rng & Units 08/20/2021 07/03/2021 06/24/2021  WBC 4.0 - 10.5 K/uL 6.3 5.8 8.1  Hemoglobin 13.0 - 17.0 g/dL 16.2 15.0 13.0  Hematocrit 39.0 - 52.0 % 47.7 44.0 36.4(L)  Platelets 150.0 - 400.0 K/uL 107.0(L) 121.0(L) 93(L)    Lab Results  Component Value Date/Time   VD25OH 54.44 08/20/2021 11:17 AM    VD25OH 45.75 08/09/2020 11:53 AM    Clinical ASCVD: Yes  The ASCVD Risk score (Arnett DK, et al., 2019) failed to calculate for the following reasons:   The 2019 ASCVD risk score is only valid for ages 34 to 46    Depression screen PHQ 2/9 08/20/2021 08/16/2020 08/11/2019  Decreased Interest 0 0 0  Down, Depressed, Hopeless 0 0 0  PHQ - 2 Score 0 0 0  Altered sleeping - - -  Tired, decreased energy - - -  Change in appetite - - -  Feeling bad or failure about yourself  - - -  Trouble concentrating - - -  Moving slowly or fidgety/restless - - -  Suicidal thoughts - - -  PHQ-9 Score - - -  Difficult doing work/chores - - -     Social History   Tobacco Use  Smoking Status Former   Types: Cigarettes   Quit date: 11/11/1960   Years since quitting: 60.9  Smokeless Tobacco Never   BP Readings from Last 3 Encounters:  10/08/21 120/74  09/03/21 140/78  08/20/21 118/66   Pulse Readings from Last 3 Encounters:  10/08/21 65  09/03/21 (!) 58  08/20/21 (!) 54   Wt Readings from Last 3 Encounters:  10/08/21 181 lb (82.1 kg)  09/03/21 183 lb 6.4 oz (83.2 kg)  08/20/21 183 lb 3 oz (83.1 kg)   BMI Readings from Last 3 Encounters:  10/08/21 24.55 kg/m  09/03/21 24.20 kg/m  08/20/21 24.50 kg/m    Assessment/Interventions: Review of patient past medical history, allergies, medications, health status, including review of consultants reports, laboratory and other test data, was performed as part of comprehensive evaluation and provision of chronic care management services.   SDOH:  (Social Determinants of Health) assessments and interventions performed: Yes SDOH Interventions    Flowsheet Row Most Recent Value  SDOH Interventions   Financial Strain Interventions Intervention Not Indicated      SDOH Screenings   Alcohol Screen: Not on file  Depression (PHQ2-9): Low Risk    PHQ-2 Score: 0  Financial Resource Strain: Low Risk    Difficulty of Paying Living Expenses: Not hard  at all  Food Insecurity: Not on file  Housing: Not on file  Physical Activity: Not on file  Social Connections: Not on file  Stress: Not on file  Tobacco Use: Medium Risk   Smoking Tobacco Use: Former   Smokeless Tobacco Use: Never   Passive Exposure: Not on file  Transportation Needs: Not on file    [place]  Allergies  Allergen Reactions   Doxepin Other (See Comments)    Lethargy, overly sedating at low dose   Lincomycin Hcl Rash    Medications Reviewed Today     Reviewed by Dylan [name], [facility] (Pharmacist) on 10/15/21 at 1151  Med List Status: <None>   Medication  Order Taking? Sig Documenting Provider Last Dose Status Informant  apixaban (ELIQUIS) 5 MG TABS tablet [id number] Yes Take 1 tablet (5 mg total) by mouth 2 (two) times daily. Baldwin [place], PA-C Taking Active   Cholecalciferol (VITAMIN D3) 1000 units CAPS [id number] Yes Take 1 capsule (1,000 Units total) by mouth daily. Dylan Bush, MD Taking Active Spouse/Significant Other  feeding supplement (ENSURE ENLIVE / ENSURE PLUS) LIQD [id number] Yes Take 237 mLs by mouth 2 (two) times daily between meals. Loletha Grayer, MD Taking Active Spouse/Significant Other  finasteride (PROSCAR) 5 MG tablet [id number] Yes Take 1 tablet (5 mg total) by mouth daily. Dylan Bush, MD Taking Active   memantine (NAMENDA XR) 14 MG CP24 24 hr capsule [id number] Yes Take 1 capsule (14 mg total) by mouth daily. Dylan Bush, MD Taking Active   midodrine (PROAMATINE) 5 MG tablet [id number] Yes Take 5 mg by mouth 3 (three) times daily. [provider] Taking Active   sertraline (ZOLOFT) 100 MG tablet [id number] Yes Take 1 tablet (100 mg total) by mouth daily. Dylan Bush, MD Taking Active   vitamin B-12 (V-R VITAMIN B-12) 500 MCG tablet [id number] Yes Take 1 tablet (500 mcg total) by mouth daily. 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   Sepsis with acute organ dysfunction ([place]) 05/30/2020   PAD (peripheral artery disease) ([place]) 03/21/2020   Right leg swelling 02/19/2020   Dysphagia 02/19/2020   Pruritus 02/19/2020   Medicare annual wellness visit, subsequent 08/11/2019   CKD (chronic kidney disease) stage 3, GFR 30-59 ml/min ([place]) 01/13/2019   Health maintenance examination 08/03/2018   Advanced care planning/counseling discussion 08/03/2018   Vitamin B12 deficiency 08/03/2018   Bilateral hearing loss 08/03/2018   Recurrent syncope 06/22/2018   Vitamin D deficiency 05/19/2018   Fall 05/19/2018   Dementia with behavioral disturbance 05/19/2018   Cataract extraction status 03/06/2017   Obstruction of right lacrimal duct 10/16/2015   Exposure keratopathy, 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   Eversion of right lacrimal punctum 05/17/2015   Epiphora due to insufficient drainage of right side 05/17/2015   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   Basal cell carcinoma of right nasal sidewall 12/15/2014   Mohs defect of tear duct of right eye 12/15/2014   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/2014   Mohs defect of eyelid 12/14/2014   Thrombocytopenia ([place]) 02/21/2011   Dyslipidemia 02/04/2008   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   ERECTILE DYSFUNCTION, MILD 02/03/2007   Spermatocele of epididymis, single 02/03/2007    Immunization History  Administered Date(s) Administered   Fluad Quad(high Dose 65+) 08/11/2019, 08/16/2020, 08/20/2021   Influenza Whole 09/11/2002   PFIZER(Purple Top)SARS-COV-2 Vaccination 11/26/2019, 12/17/2019, 08/25/2020    Pfizer Covid-19 Vaccine Bivalent Booster 54yr & up 08/20/2021   Pneumococcal Polysaccharide-23 08/16/2020   Td 01/24/2005, 10/03/2010    Conditions to be addressed/monitored:  Hyperlipidemia, Atrial Fibrillation, Chronic Kidney Disease, and BPH, Dementia, Orthostatic hypotension  Care [place] : [place] Updates made by FCharlton [name] RMount Vernonsince 10/15/2021 12:00 AM     Problem: Hyperlipidemia,  Atrial Fibrillation, and BPH, Dementia, Orthostatic hypotension   Priority: High     Long-Range Goal: Disease mgmt   Start Date: 10/15/2021  Expected End Date: 10/15/2022  This Visit's Progress: On track  Priority: High  Note:   Current Barriers:  Unable to independently monitor therapeutic efficacy  Pharmacist Clinical Goal(s):  Patient Dylan achieve adherence to monitoring guidelines and medication adherence to achieve therapeutic efficacy through collaboration with PharmD and provider.   Interventions: 1:1 collaboration with Dylan Bush, MD regarding development and update of comprehensive plan of care as evidenced by provider attestation and co-signature Inter-disciplinary care team collaboration (see longitudinal plan of care) Comprehensive medication review performed; medication list updated in electronic medical record  Hyperlipidemia: (LDL goal < 100) -Not ideally controlled - LDL is above goal; given dementia and comorbidities -Hx PAD. No statin recommended per PCP 08/2021 -Current treatment: None -Medications previously tried: n/a  -Educated on Cholesterol goals; Importance of limiting foods high in cholesterol; -Counseled on diet and exercise extensively  Atrial Flutter (Goal: prevent stroke and major bleeding) -Controlled - caregiver reports compliance with Eliquis; denies issue so far -CHADSVASC: 2 -Current treatment: Rate control: None Anticoagulation: Eliquis 5 mg BID -Home BP and HR readings: 116/67, 77; 107/59, 84; 136/75, 99; 114/81, 86   -Counseled on increased risk of stroke due to Afib and benefits of anticoagulation for stroke prevention; importance of adherence to anticoagulant exactly as prescribed; bleeding risk associated with Eliquis and importance of self-monitoring for signs/symptoms of bleeding; avoidance of NSAIDs due to increased bleeding risk with anticoagulants; seeking medical attention after a head injury or if there is blood in the urine/stool; -Recommended to continue current medication  Orthostatic hypotension (Goal: maintain BP 100/60-130/80) -Controlled - caregiver reports compliance with AM and HS doses of midodrine, does occasionally miss midday dose; overall no recent issues with presyncope -Hx recurrent syncope -Hx CKD stage 3a -Current treatment  Midodrine 5 mg TID -Counseled on BP goals; importance of hydration to maintain BP and prevent kidney impairment -Recommended to continue current medication  Dementia w/ behavioral disturbance (Goal: slow progression) -Controlled - caregiver reports compliance with medications; she has noticed improvement in "inhibitions" since increasing sertraline to 100 mg -Pt has previously declined neurology evaluation -Current treatment  Memantine XR 14 mg daily Sertraline 100 mg daily -Medications previously tried: donepezil  -Counseled on benefits of memantine for slowing progression of disease; discussed she is unlikely to see benefit on day-to-day basis -Pt is at elevated risk for bleeding given SSRI + Eliquis, discussed risks at length; benefits of sertraline outweigh risks currently as pt could not be in public before being on sertraline -Recommended to continue current medication  Urinary incontinence (Goal: manage symptoms) -Controlled - caregiver reports urinary habits are improved since starting finasteride -CT scan 04/2021 showed mild prostate enlargement -Current treatment  Finasteride 5 mg daily -Counseled on enlarged prostate and benefits of  finasteride -Recommended to continue current medication  Health Maintenance -Vaccine gaps: Prevnar, Shingrix, Covid booster, TDAP -Caregiver reports he had Covid booster 08/20/21 at pharmacy, same day as flu shot. Updated chart. -Hx recurrent Cdiff, chronic constipation -Current therapy:  Vitamin D 1000 IU Vitamin B12 500 mcg Boost HP nutrition supplement -once day  Align AM Senokot PM Miralax w/ coffee -Patient is satisfied with current therapy and denies issues -Recommended to continue current medication  Patient Goals/Self-Care Activities Patient Dylan:  - take medications as prescribed as evidenced by patient report and record review focus on medication adherence by pill box check blood  pressure daily, document, and provide at future appointments      Medication Assistance: None required.  Patient affirms current coverage meets needs.  Compliance/Adherence/Medication fill history: Care Gaps: None  Star-Rating Drugs: None  Patient's preferred pharmacy is:  [facility] [address], [place] [address] Phone: [phone] Fax: [phone]  Uses pill box? Yes Pt endorses 100% compliance  We discussed: Benefits of medication synchronization, packaging and delivery as well as enhanced pharmacist oversight with [employer]. Patient decided to: Continue current medication management strategy  Care Plan and Follow Up Patient Decision:  Patient agrees to Care Plan and Follow-up.  Plan: Telephone follow up appointment with care management team member scheduled for:  6 months  [name], PharmD, BCACP Clinical Pharmacist [facility] [phone]

## 2021-10-22 NOTE — Progress Notes (Signed)
Carelink Summary Report / Loop Recorder 

## 2021-11-05 ENCOUNTER — Encounter: Payer: Self-pay | Admitting: Family Medicine

## 2021-11-05 DIAGNOSIS — I4892 Unspecified atrial flutter: Secondary | ICD-10-CM | POA: Insufficient documentation

## 2021-11-08 ENCOUNTER — Ambulatory Visit (INDEPENDENT_AMBULATORY_CARE_PROVIDER_SITE_OTHER): Payer: Medicare Other

## 2021-11-08 ENCOUNTER — Other Ambulatory Visit: Payer: Self-pay

## 2021-11-08 DIAGNOSIS — I483 Typical atrial flutter: Secondary | ICD-10-CM | POA: Diagnosis not present

## 2021-11-08 LAB — ECHOCARDIOGRAM COMPLETE
AR max vel: 2.49 cm2
AV Area VTI: 2.4 cm2
AV Area mean vel: 1.94 cm2
AV Mean grad: 1 mmHg
AV Peak grad: 2 mmHg
Ao pk vel: 0.72 m/s
Area-P 1/2: 6.65 cm2
Calc EF: 59.7 %
S' Lateral: 3.1 cm
Single Plane A2C EF: 63 %
Single Plane A4C EF: 55.8 %

## 2021-11-11 LAB — CUP PACEART REMOTE DEVICE CHECK
Date Time Interrogation Session: 20221231230734
Implantable Pulse Generator Implant Date: 20191120

## 2021-11-13 ENCOUNTER — Ambulatory Visit (INDEPENDENT_AMBULATORY_CARE_PROVIDER_SITE_OTHER): Payer: Medicare Other

## 2021-11-13 DIAGNOSIS — R55 Syncope and collapse: Secondary | ICD-10-CM | POA: Diagnosis not present

## 2021-11-14 ENCOUNTER — Ambulatory Visit
Admission: RE | Admit: 2021-11-14 | Discharge: 2021-11-14 | Disposition: A | Discharge status: Home / Self Care | Payer: Medicare Other | Source: Ambulatory Visit | Attending: Emergency Medicine | Admitting: Emergency Medicine

## 2021-11-14 ENCOUNTER — Other Ambulatory Visit: Payer: Self-pay

## 2021-11-14 VITALS — BP 130/83 | HR 88 | Temp 98.1°F | Resp 18

## 2021-11-14 DIAGNOSIS — J069 Acute upper respiratory infection, unspecified: Secondary | ICD-10-CM | POA: Diagnosis not present

## 2021-11-14 DIAGNOSIS — H1033 Unspecified acute conjunctivitis, bilateral: Secondary | ICD-10-CM | POA: Diagnosis not present

## 2021-11-14 MED ORDER — POLYMYXIN B-TRIMETHOPRIM 10000-0.1 UNIT/ML-% OP SOLN: 1.0000 [drp] | Freq: Four times a day (QID) | OPHTHALMIC | 0 refills | Status: AC

## 2021-11-14 NOTE — ED Provider Notes (Signed)
[facility]    CSN: [id number] Arrival date & time: 11/14/21  1600      History   Chief Complaint Chief Complaint  Patient presents with   Conjunctivitis   Cough    HPI Dylan [name] is a 88 y.o. male.  Accompanied by his wife, patient presents with 3-day history of bilateral eye redness and drainage.  He has yellow-green eye drainage and crusting in his lashes.  He also has green nasal mucous and cough.  No fever, shortness of breath, vomiting, diarrhea, or other symptoms.  His wife has given him OTC cough medication. His medical history includes right bundle blanch block Alzheimer's dementia, CKD, orthostatic hypotension, syncope, basal cell carcinoma.  The history is provided by the patient, medical records and the spouse.   Past Medical History:  Diagnosis Date   Basal cell carcinoma 08/31/2020   Mid forehead - ED&C    Basal cell carcinoma of right nasal sidewall 12/15/2014   Bilateral hearing loss 08/03/2018   S/p audiology evaluation, has new hearing aides.   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/2014   Mohs defect of eyelid 12/14/2014   Nuclear sclerosis, left 04/01/2017   Obstruction of right lacrimal duct 10/16/2015   PAD (peripheral artery disease) ([place]) 03/21/2020   Abnormal ABI, abnormal arterial duplex 03/2020:  R - moderate RLE disease, abnormal TBI L - WNL, abnormal TBI Referred for vascular consult - rec  medical management   Pruritus 02/19/2020   Recurrent syncope 06/22/2018   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   Right leg swelling 02/19/2020   Sepsis ([place]) 05/30/2020   Thrombocytopenia ([place]) 02/21/2011   periph smear stable 02/2020   Vitamin B12 deficiency 08/03/2018   Completed b12 shots 09/2018, maintaining levels on oral replacement (dissolvable tablets) 01/2019   Vitamin D deficiency 05/19/2018    Patient Active Problem List   Diagnosis Date Noted   Atrial flutter ([place]) 11/05/2021   Clostridium difficile colitis 06/04/2021   Left testicular pain 06/04/2021   Sacral pressure sore 06/04/2021   Bradycardia 06/04/2021   Fecal impaction ([place])    Pre-syncope 05/04/2021   Constipation 05/04/2021   Proteinuria 05/04/2021   Orthostatic hypotension 05/04/2021   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   Sepsis with acute organ dysfunction ([place]) 05/30/2020   PAD (peripheral artery disease) ([place]) 03/21/2020   Right leg swelling 02/19/2020   Dysphagia 02/19/2020   Pruritus 02/19/2020   Medicare annual wellness visit, subsequent 08/11/2019   CKD (chronic kidney disease) stage 3, GFR 30-59 ml/min ([place]) 01/13/2019   Health maintenance examination 08/03/2018   Advanced care planning/counseling discussion 08/03/2018   Vitamin B12 deficiency 08/03/2018   Bilateral hearing loss 08/03/2018   Recurrent syncope 06/22/2018   Vitamin D deficiency 05/19/2018   Fall 05/19/2018   Dementia with behavioral disturbance 05/19/2018   Cataract extraction status 03/06/2017   Obstruction of right lacrimal duct 10/16/2015   Exposure keratopathy, right  05/17/2015   Eversion of right lacrimal punctum 05/17/2015   Epiphora due to insufficient drainage of right side 05/17/2015   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   Basal cell carcinoma of right nasal sidewall 12/15/2014   Mohs defect of tear duct of right eye 12/15/2014   Malignant neoplasm of skin of right eyelid including  canthus 12/14/2014   Mohs defect of eyelid 12/14/2014   Thrombocytopenia ([place]) 02/21/2011   Dyslipidemia 02/04/2008   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   ERECTILE DYSFUNCTION, MILD 02/03/2007   Spermatocele of epididymis, single 02/03/2007    Past Surgical History:  Procedure Laterality Date   BASAL CELL CARCINOMA EXCISION Right 01/2015   with reconstructive surgery around eye   CATARACT EXTRACTION Left 03/2017   ETT  09/2018   negative for ischemia, extremely poor exercise tolerance ([place])   FOOT SURGERY Left 1998   metatarsal amputation after injury   KNEE ARTHROSCOPY  1993   Right   KNEE ARTHROSCOPY  01/2001   Left   LOOP RECORDER INSERTION N/A 09/30/2018   Procedure: LOOP RECORDER INSERTION;  Surgeon: Constance Haw, MD;  Location: [facility];  Service: Cardiovascular;  Laterality: N/A;       Home Medications    Prior to Admission medications   Medication Sig Start Date End Date Taking? Authorizing Provider  trimethoprim-polymyxin b (POLYTRIM) ophthalmic solution Place 1 drop into both eyes 4 (four) times daily for 7 days. 11/14/21 11/21/21 Yes Sharion Balloon, NP  apixaban (ELIQUIS) 5 MG TABS tablet Take 1 tablet (5 mg total) by mouth 2 (two) times daily. 10/08/21   Baldwin Jamaica, PA-C  Cholecalciferol (VITAMIN D3) 1000 units CAPS Take 1 capsule (1,000 Units total) by mouth daily. 08/03/18   [name], MD  feeding supplement (ENSURE ENLIVE / ENSURE PLUS) LIQD Take 237 mLs by mouth 2 (two) times daily between meals. 05/06/21   Loletha Grayer, MD  finasteride (PROSCAR) 5 MG tablet Take 1 tablet (5 mg total) by mouth daily. 08/27/21   [name], MD  memantine (NAMENDA XR) 14 MG CP24 24 hr capsule Take 1 capsule (14 mg total) by mouth daily. 08/06/21   [name], MD  midodrine (PROAMATINE) 5 MG tablet Take 5 mg by mouth 3 (three) times daily. 09/17/21   [provider]  sertraline (ZOLOFT)  100 MG tablet Take 1 tablet (100 mg total) by mouth daily. 08/20/21   [name], MD  vitamin B-12 (V-R VITAMIN B-12) 500 MCG tablet Take 1 tablet (500 mcg total) by mouth daily. 08/11/19   [name], MD    Family History Family History  Problem Relation Age of Onset   Congestive Heart Failure Mother    Stroke Father 43   Congestive Heart Failure Father    Cataracts Sister    Leukemia Brother     Social History Social History   Tobacco Use   Smoking status: Former    Types: Cigarettes    Quit date: 11/11/1960    Years since quitting: 61.0   Smokeless tobacco: Never  Vaping Use   Vaping Use: Never used  Substance Use Topics   Alcohol use: No   Drug use: No     Allergies   Doxepin and Lincomycin hcl   Review of Systems Review of Systems  Constitutional:  Negative for chills and fever.  HENT:  Positive for congestion. Negative for ear pain and sore throat.   Eyes:  Positive for discharge, redness and itching.  Negative for pain and visual disturbance.  Respiratory:  Positive for cough. Negative for shortness of breath.   Cardiovascular:  Negative for chest pain and palpitations.  Gastrointestinal:  Negative for diarrhea and vomiting.  Skin:  Negative for color change and rash.  All other systems reviewed and are negative.   Physical Exam Triage Vital Signs ED Triage Vitals [11/14/21 1634]  Enc Vitals Group     BP 130/83     Pulse Rate 88     Resp 18     Temp 98.1 F (36.7 C)     Temp src      SpO2 98 %     Weight      Height      Head Circumference      Peak Flow      Pain Score      Pain Loc      Pain Edu?      Excl. in [place]?    No data found.  Updated Vital Signs BP 130/83 (BP Location: Left Arm)    Pulse 88    Temp 98.1 F (36.7 C)    Resp 18    SpO2 98%   Visual Acuity Right Eye Distance:   Left Eye Distance:   Bilateral Distance:    Right Eye Near:   Left Eye Near:    Bilateral Near:     Physical Exam Vitals and nursing  note reviewed.  Constitutional:      General: He is not in acute distress.    Appearance: He is well-developed. He is not ill-appearing.  HENT:     Right Ear: Tympanic membrane normal.     Left Ear: Tympanic membrane normal.     Nose: Nose normal.     Mouth/Throat:     Mouth: Mucous membranes are moist.     Pharynx: Oropharynx is clear.  Eyes:     General: Lids are normal.        Right eye: Discharge present.        Left eye: Discharge present.    Extraocular Movements: Extraocular movements intact.     Conjunctiva/sclera:     Right eye: Right conjunctiva is injected.     Left eye: Left conjunctiva is injected.     Pupils: Pupils are equal, round, and reactive to light.     Comments: Thick yellow-green drainage in bilateral inner canthus.   Cardiovascular:     Rate and Rhythm: Normal rate and regular rhythm.     Heart sounds: Normal heart sounds.  Pulmonary:     Effort: Pulmonary effort is normal. No respiratory distress.     Breath sounds: Normal breath sounds.  Musculoskeletal:     Cervical back: Neck supple.  Skin:    General: Skin is warm and dry.  Neurological:     Mental Status: He is alert.  Psychiatric:        Mood and Affect: Mood normal.        Behavior: Behavior normal.     UC Treatments / Results  Labs (all labs ordered are listed, but only abnormal results are displayed) Labs Reviewed  COVID-19, FLU A+B NAA  COVID-19, FLU A+B AND RSV    EKG   Radiology No results found.  Procedures Procedures (including critical care time)  Medications Ordered in UC Medications - No data to display  Initial Impression / Assessment and Plan / UC Course  I have reviewed the triage vital signs and the nursing notes.  Pertinent  labs & imaging results that were available during my care of the patient were reviewed by me and considered in my medical decision making (see chart for details).   Viral URI, Acute bilateral bacterial conjunctivitis.  Treating  conjunctivitis with Polytrim eyedrops.  Instructed patient's wife to follow-up to follow-up with the patient's eye care provider if his symptoms are not improving.  COVID, Flu, and RSV pending.  Discussed symptomatic treatment including Tylenol, rest, hydration.  Instructed patient's wife to follow up with his PCP if his symptoms are not improving.  Patient and his wife agree to plan of care.    Final Clinical Impressions(s) / UC Diagnoses   Final diagnoses:  Viral URI  Acute bacterial conjunctivitis of both eyes     Discharge Instructions      Use the antibiotic eyedrops as prescribed.  Follow-up with your husband's eye doctor for a recheck in 2-3 days if his symptoms are not improving.    Your husband's COVID, Flu, and RSV tests are pending.  Give him Tylenol as needed for fever or discomfort.    Follow up with his primary care provider.           ED Prescriptions     Medication Sig Dispense Auth. Provider   trimethoprim-polymyxin b (POLYTRIM) ophthalmic solution Place 1 drop into both eyes 4 (four) times daily for 7 days. 10 mL Sharion Balloon, NP      PDMP not reviewed this encounter.   Sharion Balloon, NP 11/14/21 1705

## 2021-11-14 NOTE — Discharge Instructions (Addendum)
Use the antibiotic eyedrops as prescribed.  Follow-up with your husband's eye doctor for a recheck in 2-3 days if his symptoms are not improving.    Your husband's COVID, Flu, and RSV tests are pending.  Give him Tylenol as needed for fever or discomfort.    Follow up with his primary care provider.

## 2021-11-14 NOTE — ED Triage Notes (Signed)
Pt presents with cough and bilateral pink eyes x 3 days.

## 2021-11-15 LAB — COVID-19, FLU A+B AND RSV
Influenza A, NAA: NOT DETECTED
Influenza B, NAA: NOT DETECTED
RSV, NAA: NOT DETECTED
SARS-CoV-2, NAA: NOT DETECTED

## 2021-11-17 NOTE — Progress Notes (Deleted)
Cardiology Office Note Date:  11/17/2021  Patient ID:  Dylan [name], Dylan [name] 07-30-1934, MRN [id number] PCP:  [name], MD  Cardiologist:  Dr. Bettina Gavia EP: Dr. Curt Bears   Chief Complaint:  hospital follow up  History of Present Illness: Dylan [name] is a 88 y.o. male with history of HLD, dementia, CKD (III), RBBB and syncope > ILR implant  He was admitted to [facility] 05/31/20 with acute onset of worsening generalized weakness with associated mild dyspnea. In the ER, VSS WBC 23.1, lactic acid 3.9, mild bump in Creat 1.59 (baselin 1.18) Started on IFV and antibiotics Suspect pneumonia Discharged on PO antibiotics and started on midodrine as well Discharged 06/02/20.  He comes today to be seen for Dr. Curt Bears, last seen by him 09/03/21.  He had a recent hospitalization for C-Diff, no cardiac issues, remained on midodrine for his orthostatic hypotension, no recurrent syncope. No changes were made.      09/25/21, ILR noted Aflutter w/CVR and planned to come in to discuss [place].  I saw him 10/08/21 He is accompanied by his wife. He has known baseline dementia and defers to his wife for answers.  She mentions he sleep poorly and tends to wander during the [name] He though denies any kind oc CP or SOB Today's BP is described as great for him He has not had recurrent syncope He will sometimes stumble because he likes his shoes tied loose, though does not fall.  His wife tries to keep them tied tight, No hx of bleeding or hematology history  Actual burden was difficult to assess with known false AF episodes 2/2 PACs/ectopy He was started on eliquis, discussed management strategies, he wanted to avoid procedures (ablation), planned to update his echo, see him back on a/c and plan DCCV if still in AFlutter  Echo noted LVEF 55-60%, no WMA, no VHD, LA nml size Last transmission still in AFl  *** symptoms *** bleeding, CBC *** DCCV, compliance, ablate?   Device information MDT LNQII   Implanted 09/30/2018 for syncope  Past Medical History:  Diagnosis Date   Basal cell carcinoma 08/31/2020   Mid forehead - ED&C    Basal cell carcinoma of right nasal sidewall 12/15/2014   Bilateral hearing loss 08/03/2018   S/p audiology evaluation, has new hearing aides.   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/2014   Mohs defect of eyelid 12/14/2014   Nuclear sclerosis, left 04/01/2017   Obstruction of right lacrimal duct 10/16/2015   PAD (peripheral artery disease) ([place]) 03/21/2020   Abnormal ABI, abnormal arterial duplex 03/2020:  R - moderate RLE disease, abnormal TBI L - WNL, abnormal TBI Referred for vascular consult - rec medical management   Pruritus 02/19/2020   Recurrent syncope 06/22/2018   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   Right leg swelling 02/19/2020   Sepsis ([place]) 05/30/2020   Thrombocytopenia ([place]) 02/21/2011   periph smear stable  02/2020   Vitamin B12 deficiency 08/03/2018   Completed b12 shots 09/2018, maintaining levels on oral replacement (dissolvable tablets) 01/2019   Vitamin D deficiency 05/19/2018    Past Surgical History:  Procedure Laterality Date   BASAL CELL CARCINOMA EXCISION Right 01/2015   with reconstructive surgery around eye   CATARACT EXTRACTION Left 03/2017   ETT  09/2018   negative for ischemia, extremely poor exercise tolerance ([place])   FOOT SURGERY  Left 1998   metatarsal amputation after injury   KNEE ARTHROSCOPY  1993   Right   KNEE ARTHROSCOPY  01/2001   Left   LOOP RECORDER INSERTION N/A 09/30/2018   Procedure: LOOP RECORDER INSERTION;  Surgeon: Constance Haw, MD;  Location: [facility];  Service: Cardiovascular;  Laterality: N/A;    Current Outpatient Medications  Medication Sig Dispense Refill   apixaban (ELIQUIS) 5 MG TABS tablet Take 1 tablet (5 mg total) by mouth 2 (two) times daily. 180 tablet 1   Cholecalciferol (VITAMIN D3) 1000 units CAPS Take 1 capsule (1,000 Units total) by mouth daily. 30 capsule    feeding supplement (ENSURE ENLIVE / ENSURE PLUS) LIQD Take 237 mLs by mouth 2 (two) times daily between meals. 14220 mL 0   finasteride (PROSCAR) 5 MG tablet Take 1 tablet (5 mg total) by mouth daily. 90 tablet 3   memantine (NAMENDA XR) 14 MG CP24 24 hr capsule Take 1 capsule (14 mg total) by mouth daily. 90 capsule 3   midodrine (PROAMATINE) 5 MG tablet Take 5 mg by mouth 3 (three) times daily.     sertraline (ZOLOFT) 100 MG tablet Take 1 tablet (100 mg total) by mouth daily. 90 tablet 3   trimethoprim-polymyxin b (POLYTRIM) ophthalmic solution Place 1 drop into both eyes 4 (four) times daily for 7 days. 10 mL 0   vitamin B-12 (V-R VITAMIN B-12) 500 MCG tablet Take 1 tablet (500 mcg total) by mouth daily.     No current facility-administered medications for this visit.    Allergies:   Doxepin and Lincomycin hcl   Social History:  The patient  reports that he quit smoking about 61 years ago. His smoking use included cigarettes. He has never used smokeless tobacco. He reports that he does not drink alcohol and does not use drugs.   Family History:  The patient's family history includes Cataracts in his sister; Congestive Heart Failure in his father and mother; Leukemia in his brother; Stroke (age of onset: 27) in his father.  ROS:  Please see the history of present illness. All other systems are reviewed  and otherwise negative.   PHYSICAL EXAM:  VS:  There were no vitals taken for this visit. BMI: There is no height or weight on file to calculate BMI. Well nourished, well developed, in no acute distress  HEENT: normocephalic, atraumatic  Neck: no JVD, carotid bruits or masses Cardiac: ***  irreg-irreg; no significant murmurs, no rubs, or gallops Lungs:   *** CTA b/l, no wheezing, rhonchi or rales  Abd: soft, nontender MS: no deformity or atrophy Ext: *** no edema  Skin: warm and dry, no rash Neuro:  No gross deficits appreciated Psych: euthymic mood, full affect  *** ILR site is stable, no tethering or discomfort   EKG:  done today and reviewed by myself ***  ILR interrogation done today and reviewed by myself: ***  11/08/2021: TTE IMPRESSIONS   1. Left ventricular ejection fraction, by estimation, is 55 to 60%. Left  ventricular  ejection fraction by 2D MOD biplane is 59.7 %. The left  ventricle has normal function. The left ventricle has no regional wall  motion abnormalities. Left ventricular  diastolic parameters were normal.   2. Right ventricular systolic function is normal. The right ventricular  size is normal.   3. The mitral valve is normal in structure. Mild mitral valve  regurgitation.   4. The aortic valve was not well visualized. Aortic valve regurgitation  is trivial.   5. The inferior vena cava is normal in size with greater than 50%  respiratory variability, suggesting right atrial pressure of 3 mmHg.   09/04/2018: ETT Blood pressure demonstrated a normal response to exercise. Clinically and electrically negative for ischemia Extremelly poor exercise tolerance Dr. Curt Bears commented:  Poor exercise tolerance.  No evidence of chronotropic incompetence.  Will need Linq monitoring for episode of syncope.   14 day monitor Aug 2019  Indication, syncope   Baseline rhythm, sinus minimum maximum and average rate 44 136 and 64 bpm   Supraventricular ectopy,  less than 1% there were frequent 297 runs of APCs longest lasting 11.2 seconds with a rate of 122 bpm the fastest 5 complexes a rate of 169 bpm.  There are no episodes of atrial fibrillation or flutter.   Ventricular ectopy, rare isolated APCs 5 couplets and one triplet less than 1% burden. There were no episodes of sinus node or AV nodal block There are no symptomatic events Conclusion brief runs of atrial premature contractions   Recent Labs: 05/04/2021: Magnesium 1.8 08/20/2021: ALT 26; BUN 19; Creatinine, Ser 1.30; Hemoglobin 16.2; Platelets 107.0; Potassium 4.3; Sodium 139  08/20/2021: Cholesterol 210; Direct LDL 140.0; HDL 36.70; Total CHOL/HDL Ratio 6; Triglycerides 332.0; VLDL 66.4   CrCl cannot be calculated (Patient's most recent lab result is older than the maximum 21 days allowed.).   Wt Readings from Last 3 Encounters:  10/08/21 181 lb (82.1 kg)  09/03/21 183 lb 6.4 oz (83.2 kg)  08/20/21 183 lb 3 oz (83.1 kg)     Other studies reviewed: Additional studies/records reviewed today include: summarized above  ASSESSMENT AND PLAN:  1. Syncope     Baseline conduction system disease, RBBB, LAD     *** No brady or pause episodes  2. New Aflutter CHA2DS2Vasc is 2 ***   Disposition: ***   Current medicines are reviewed at length with the patient today.  The patient did not have any concerns regarding medicines.  Dylan Night, PA-C 11/17/2021 11:25 AM     [address] [place] [address] [phone] (office)  [phone] (fax)

## 2021-11-19 ENCOUNTER — Encounter: Payer: Medicare Other | Admitting: Physician Assistant

## 2021-11-22 NOTE — Progress Notes (Signed)
Carelink Summary Report / Loop Recorder 

## 2021-11-23 ENCOUNTER — Ambulatory Visit: Payer: Medicare Other | Admitting: Family Medicine

## 2021-12-02 ENCOUNTER — Encounter: Payer: Self-pay | Admitting: Family Medicine

## 2021-12-03 ENCOUNTER — Encounter: Payer: Self-pay | Admitting: Family Medicine

## 2021-12-06 DIAGNOSIS — H02132 Senile ectropion of right lower eyelid: Secondary | ICD-10-CM | POA: Diagnosis not present

## 2021-12-06 DIAGNOSIS — H16211 Exposure keratoconjunctivitis, right eye: Secondary | ICD-10-CM | POA: Diagnosis not present

## 2021-12-11 ENCOUNTER — Telehealth: Payer: Self-pay | Admitting: Family Medicine

## 2021-12-11 ENCOUNTER — Telehealth: Payer: Self-pay

## 2021-12-11 DIAGNOSIS — I951 Orthostatic hypotension: Secondary | ICD-10-CM

## 2021-12-11 DIAGNOSIS — I4892 Unspecified atrial flutter: Secondary | ICD-10-CM

## 2021-12-11 DIAGNOSIS — N183 Chronic kidney disease, stage 3 unspecified: Secondary | ICD-10-CM

## 2021-12-11 DIAGNOSIS — I739 Peripheral vascular disease, unspecified: Secondary | ICD-10-CM

## 2021-12-11 DIAGNOSIS — I452 Bifascicular block: Secondary | ICD-10-CM

## 2021-12-11 DIAGNOSIS — F03918 Unspecified dementia, unspecified severity, with other behavioral disturbance: Secondary | ICD-10-CM

## 2021-12-11 DIAGNOSIS — R55 Syncope and collapse: Secondary | ICD-10-CM

## 2021-12-11 DIAGNOSIS — W19XXXA Unspecified fall, initial encounter: Secondary | ICD-10-CM

## 2021-12-11 NOTE — Telephone Encounter (Signed)
Spoke with wife at her OV today.  Ongoing caregiver stress unable to keep caregiver service. Progressive worsening of dementia noted by wife.  Will refer to ambulatory palliative care services for assistance/evaluation.

## 2021-12-11 NOTE — Telephone Encounter (Signed)
Left message to get community palliative care consult scheduled

## 2021-12-13 ENCOUNTER — Ambulatory Visit (INDEPENDENT_AMBULATORY_CARE_PROVIDER_SITE_OTHER): Payer: Medicare Other

## 2021-12-13 ENCOUNTER — Telehealth: Payer: Self-pay | Admitting: Nurse Practitioner

## 2021-12-13 DIAGNOSIS — R55 Syncope and collapse: Secondary | ICD-10-CM | POA: Diagnosis not present

## 2021-12-13 LAB — CUP PACEART REMOTE DEVICE CHECK
Date Time Interrogation Session: 20230201230653
Implantable Pulse Generator Implant Date: 20191120

## 2021-12-13 NOTE — Telephone Encounter (Signed)
Rec'd return call from patient's wife [name], and have scheduled an [place] for 12/20/21 @ 2:30 PM

## 2021-12-18 DIAGNOSIS — Z9841 Cataract extraction status, right eye: Secondary | ICD-10-CM | POA: Diagnosis not present

## 2021-12-18 DIAGNOSIS — H0288A Meibomian gland dysfunction right eye, upper and lower eyelids: Secondary | ICD-10-CM | POA: Diagnosis not present

## 2021-12-18 DIAGNOSIS — H0288B Meibomian gland dysfunction left eye, upper and lower eyelids: Secondary | ICD-10-CM | POA: Diagnosis not present

## 2021-12-18 DIAGNOSIS — Z9842 Cataract extraction status, left eye: Secondary | ICD-10-CM | POA: Diagnosis not present

## 2021-12-18 DIAGNOSIS — H04123 Dry eye syndrome of bilateral lacrimal glands: Secondary | ICD-10-CM | POA: Diagnosis not present

## 2021-12-18 DIAGNOSIS — H52223 Regular astigmatism, bilateral: Secondary | ICD-10-CM | POA: Diagnosis not present

## 2021-12-18 DIAGNOSIS — H5212 Myopia, left eye: Secondary | ICD-10-CM | POA: Diagnosis not present

## 2021-12-19 NOTE — Progress Notes (Signed)
Carelink Summary Report / Loop Recorder 

## 2021-12-20 ENCOUNTER — Encounter: Payer: Self-pay | Admitting: Nurse Practitioner

## 2021-12-20 ENCOUNTER — Other Ambulatory Visit: Payer: Medicare Other | Admitting: Nurse Practitioner

## 2021-12-20 ENCOUNTER — Other Ambulatory Visit: Payer: Self-pay

## 2021-12-20 DIAGNOSIS — F02818 Dementia in other diseases classified elsewhere, unspecified severity, with other behavioral disturbance: Secondary | ICD-10-CM

## 2021-12-20 DIAGNOSIS — Z515 Encounter for palliative care: Secondary | ICD-10-CM

## 2021-12-20 NOTE — Progress Notes (Signed)
[facility] Consult Note Telephone: [phone]  Fax: [phone]   Date of encounter: 12/20/21 4:57 PM PATIENT NAME: Dylan [name] [address] [place] [address]   [phone] (home)  DOB: 24-May-1934 MRN: [id number] PRIMARY CARE PROVIDER:    [name], MD,  [place] [address] [phone]  REFERRING PROVIDER:   [name], MD [address] [place],  [place] [address] [phone]  RESPONSIBLE PARTY:    Contact Information     Name Relation Home Work Mobile   Dylan, [name] [phone]  [phone]      Due to the COVID-19 crisis, this visit was done via telemedicine from my office and it was initiated and consent by this patient and or family.  I connected with  Dylan [name] with Dylan [name] OR PROXY on 12/20/21 by a telephone as video not available enabled telemedicine application and verified that I am speaking with the correct person.   I discussed the limitations of evaluation and management by telemedicine. The patient expressed understanding and agreed to proceed. . Palliative Care was asked to follow this patient by consultation request of  Dylan Bush, MD to address advance care planning and complex medical decision making. This is the initial visit.                            ASSESSMENT AND PLAN / RECOMMENDATIONS:  Advance Care Planning/Goals of Care: Goals include to maximize quality of life and symptom management. Patient/health care surrogate gave his/her permission to discuss.Our advance care planning conversation included a discussion about:    The value and importance of advance care planning  Experiences with loved ones who have been seriously ill or have died  Exploration of personal, cultural or spiritual beliefs that might influence medical decisions  Exploration of goals of care in the event of a sudden injury or illness  Identification  of a  healthcare agent  Review and updating or creation of an  advance directive document . Decision not to resuscitate or to de-escalate disease focused treatments due to poor prognosis. CODE STATUS: DNR  Symptom Management/Plan: 1. Advance Care Planning;  DNR  2. Goals of Care: Goals include to maximize quality of life and symptom management. Our advance care planning conversation included a discussion about:    The value and importance of advance care planning  Exploration of personal, cultural or spiritual beliefs that might influence medical decisions  Exploration of goals of care in the event of a sudden injury or illness  Identification and preparation of a healthcare agent  Review and updating or creation of an advance directive document.  3. Dementia discussed at length, progression with realistic expectations, monitor appetite, weights, falls, encourage socialization, structure, activity as able  4. Palliative care encounter; Palliative care encounter; Palliative medicine team will continue to support patient, patient's family, and medical team. Visit consisted of counseling and education dealing with the complex and emotionally intense issues of symptom management and palliative care in the setting of serious and potentially life-threatening illness Follow up Palliative Care Visit: Palliative care will continue to follow for complex medical decision making, advance care planning, and clarification of goals. Return 4 weeks or prn.  I spent 66 minutes providing this consultation. More than 50% of the time in this consultation was spent in counseling and care coordination. PPS: 40%  Chief Complaint: Initial palliative consult for complex  medical decision making  HISTORY OF PRESENT ILLNESS:  Dylan [name] is a 88 y.o. year old male  with multiple medical problems including dementia, CKD stage III, depression. I called Dylan [name] by telephone as video not available for initial palliative  consult for complex medical decision. Making. We talked about the last time Dylan [name] has been independent, life review, past medical problems including chronic disease progression of dementia. We talked about his functional and cognitive decline. We talked about ros, symptoms, appetite which is good, nutrition. We talked about behaviors with medication review. We talked about medical goals, code status, poc, role pc in [place]. We talked about caregiver stress with coping strategies. Will have PC RN do f/u 4 weeks then NP in 8. Therapeutic listening, emotional support provided. Questions answered.   History obtained from review of EMR, discussion with Dylan [name] with Dylan [name].  I reviewed available labs, medications, imaging, studies and related documents from the EMR.  Records reviewed and summarized above.   ROS 10 point system reviewed with Dylan. [name] wife negative except HPI  Physical Exam: Deferred CURRENT PROBLEM LIST:  Patient Active Problem List   Diagnosis Date Noted   Atrial flutter ([facility]) 11/05/2021   Clostridium difficile colitis 06/04/2021   Left testicular pain 06/04/2021   Sacral pressure sore 06/04/2021   Bradycardia 06/04/2021   Fecal impaction ([facility])    Pre-syncope 05/04/2021   Constipation 05/04/2021   Proteinuria 05/04/2021   Orthostatic hypotension 05/04/2021   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   Sepsis with acute organ dysfunction ([facility]) 05/30/2020   PAD (peripheral artery disease) ([facility]) 03/21/2020   Right leg swelling 02/19/2020   Dysphagia 02/19/2020   Pruritus 02/19/2020   Medicare annual wellness visit, subsequent 08/11/2019   CKD (chronic kidney disease) stage 3, GFR 30-59 ml/min ([facility]) 01/13/2019   Health maintenance examination 08/03/2018   Advanced care planning/counseling discussion 08/03/2018   Vitamin B12 deficiency 08/03/2018   Bilateral hearing loss 08/03/2018   Recurrent syncope 06/22/2018   Vitamin D deficiency 05/19/2018   Fall 05/19/2018    Dementia with behavioral disturbance 05/19/2018   Cataract extraction status 03/06/2017   Obstruction of right lacrimal duct 10/16/2015   Exposure keratopathy, 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   Eversion of right lacrimal punctum 05/17/2015   Epiphora due to insufficient drainage of right side 05/17/2015   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   Basal cell carcinoma of right nasal sidewall 12/15/2014   Mohs defect of tear duct of right eye 12/15/2014   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/2014   Mohs defect of eyelid 12/14/2014   Thrombocytopenia ([facility]) 02/21/2011   Dyslipidemia 02/04/2008   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   ERECTILE DYSFUNCTION, MILD 02/03/2007   Spermatocele of epididymis, single 02/03/2007   PAST MEDICAL HISTORY:  Active Ambulatory Problems    Diagnosis Date Noted   Dyslipidemia 02/04/2008   ERECTILE DYSFUNCTION, MILD 02/03/2007   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   Spermatocele of epididymis, single 02/03/2007   Thrombocytopenia ([facility]) 02/21/2011   Vitamin D deficiency 05/19/2018   Fall 05/19/2018   Dementia with behavioral disturbance 05/19/2018   Recurrent syncope 06/22/2018   Exposure keratopathy, 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   Health maintenance examination 08/03/2018   Advanced care planning/counseling discussion 08/03/2018   Vitamin B12 deficiency 08/03/2018   Bilateral hearing loss 08/03/2018   Cataract extraction status 03/06/2017   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   Eversion of right lacrimal punctum 05/17/2015   Epiphora  due to insufficient drainage of right side 05/17/2015   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/2014   Basal cell carcinoma of right nasal sidewall 12/15/2014   Mohs defect of tear duct of right eye 12/15/2014   Mohs defect of eyelid 12/14/2014   Obstruction of right lacrimal duct 10/16/2015   CKD (chronic kidney disease)  stage 3, GFR 30-59 ml/min ([facility]) 01/13/2019   Medicare annual wellness visit, subsequent 08/11/2019   Right leg swelling 02/19/2020   Dysphagia 02/19/2020   Pruritus 02/19/2020   PAD (peripheral artery disease) ([facility]) 03/21/2020   Sepsis with acute organ dysfunction ([facility]) 05/30/2020   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   Pre-syncope 05/04/2021   Constipation 05/04/2021   Proteinuria 05/04/2021   Orthostatic hypotension 05/04/2021   Fecal impaction ([facility])    Clostridium difficile colitis 06/04/2021   Left testicular pain 06/04/2021   Sacral pressure sore 06/04/2021   Bradycardia 06/04/2021   Atrial flutter ([facility]) 11/05/2021   Resolved Ambulatory Problems    Diagnosis Date Noted   DERMATITIS, CONTACT, NOS 02/04/2007   BACKACHE NOS 04/08/2007   PLANTAR FASCIITIS, BILATERAL 02/04/2007   ELEVATED PROSTATE SPECIFIC ANTIGEN 04/03/2007   Injury of right wrist 05/19/2018   Nuclear sclerosis, left 04/01/2017   Eyelid lesion 07/18/2015   Facial lesion 07/18/2015   Laxity of eyelid 12/15/2015   Acquired deformity of nose 12/15/2014   Disorder of ligament, unspecified site 10/16/2015   Acute renal failure superimposed on stage 3 chronic kidney disease ([facility]) 05/04/2021   Dehydration 05/05/2021   Acute colitis - suspected c. diff colitis present on admission 06/23/2021   Leukocytosis 06/23/2021   Past Medical History:  Diagnosis Date   Basal cell carcinoma 08/31/2020   Cellulitis    Dementia ([facility])    HYDROCELE, LEFT 02/03/2007   Hyperlipidemia 12/1999   Sepsis ([facility]) 05/30/2020   SOCIAL HX:  Social History   Tobacco Use   Smoking status: Former    Types: Cigarettes    Quit date: 11/11/1960    Years since quitting: 61.1   Smokeless tobacco: Never  Substance Use Topics   Alcohol use: No   FAMILY HX:  Family History  Problem Relation Age of Onset   Congestive Heart Failure Mother    Stroke Father 23   Congestive Heart Failure Father    Cataracts Sister    Leukemia Brother        ALLERGIES:  Allergies  Allergen Reactions   Doxepin Other (See Comments)    Lethargy, overly sedating at low dose   Lincomycin Hcl Rash     PERTINENT MEDICATIONS:  Outpatient Encounter Medications as of 12/20/2021  Medication Sig   apixaban (ELIQUIS) 5 MG TABS tablet Take 1 tablet (5 mg total) by mouth 2 (two) times daily.   Cholecalciferol (VITAMIN D3) 1000 units CAPS Take 1 capsule (1,000 Units total) by mouth daily.   feeding supplement (ENSURE ENLIVE / ENSURE PLUS) LIQD Take 237 mLs by mouth 2 (two) times daily between meals.   finasteride (PROSCAR) 5 MG tablet Take 1 tablet (5 mg total) by mouth daily.   memantine (NAMENDA XR) 14 MG CP24 24 hr capsule Take 1 capsule (14 mg total) by mouth daily.   midodrine (PROAMATINE) 5 MG tablet Take 5 mg by mouth 3 (three) times daily.   sertraline (ZOLOFT) 100 MG tablet Take 1 tablet (100 mg total) by mouth daily.   vitamin B-12 (V-R VITAMIN B-12) 500 MCG tablet Take 1 tablet (500 mcg total) by mouth daily.  No facility-administered encounter medications on file as of 12/20/2021.   Thank you for the opportunity to participate in the care of Dylan. Nigh.  The palliative care team will continue to follow. Please call our office at [phone] if we can be of additional assistance.   Hadlyn Amero Z Azucena, NP ,

## 2021-12-24 ENCOUNTER — Ambulatory Visit: Payer: Medicare Other | Admitting: Family Medicine

## 2021-12-28 ENCOUNTER — Telehealth: Payer: Self-pay

## 2021-12-28 NOTE — Telephone Encounter (Signed)
330 pm.  Request received from [place], NP to schedule a home visit in 1 month.  Spoke with [employer] and visit is scheduled for 3/6 @ 1 pm.

## 2022-01-11 ENCOUNTER — Encounter: Payer: Self-pay | Admitting: Family Medicine

## 2022-01-11 ENCOUNTER — Ambulatory Visit (INDEPENDENT_AMBULATORY_CARE_PROVIDER_SITE_OTHER): Payer: Medicare Other | Admitting: Family Medicine

## 2022-01-11 ENCOUNTER — Other Ambulatory Visit: Payer: Self-pay

## 2022-01-11 VITALS — BP 126/68 | HR 55 | Temp 97.4°F | Ht 72.0 in | Wt 178.5 lb

## 2022-01-11 DIAGNOSIS — K5900 Constipation, unspecified: Secondary | ICD-10-CM | POA: Diagnosis not present

## 2022-01-11 DIAGNOSIS — Z66 Do not resuscitate: Secondary | ICD-10-CM

## 2022-01-11 DIAGNOSIS — R001 Bradycardia, unspecified: Secondary | ICD-10-CM

## 2022-01-11 DIAGNOSIS — I4892 Unspecified atrial flutter: Secondary | ICD-10-CM | POA: Diagnosis not present

## 2022-01-11 DIAGNOSIS — R55 Syncope and collapse: Secondary | ICD-10-CM | POA: Diagnosis not present

## 2022-01-11 DIAGNOSIS — F03918 Unspecified dementia, unspecified severity, with other behavioral disturbance: Secondary | ICD-10-CM

## 2022-01-11 DIAGNOSIS — I951 Orthostatic hypotension: Secondary | ICD-10-CM

## 2022-01-11 MED ORDER — BOOST HIGH PROTEIN PO LIQD: 1.0000 | Freq: Every day | ORAL | 0 refills | Status: DC | PRN

## 2022-01-11 NOTE — Assessment & Plan Note (Signed)
No recent syncopal event. Continues midodrine 5mg  tid ?

## 2022-01-11 NOTE — Assessment & Plan Note (Signed)
ALZ vs FTD, also with autonomic instability (orthostatic hypotension).  ?Wife is primary caregiver. Continue namenda XR (did not tolerate aricept)  ?

## 2022-01-11 NOTE — Assessment & Plan Note (Signed)
Bowel regimen consists of align, senakot, miralax.  ?

## 2022-01-11 NOTE — Assessment & Plan Note (Signed)
New 09/2021.  ?Due to schedule EP f/u - wife will call for this.  ?

## 2022-01-11 NOTE — Progress Notes (Signed)
? ? Patient ID: Dylan [name], male    DOB: [birth year], 88 y.o.   MRN: [id number] ? ?This visit was conducted in person. ? ?BP 126/68   Pulse (!) 55   Temp (!) 97.4 ?F (36.3 ?C) (Temporal)   Ht 6' (1.829 m)   Wt 178 lb 8 oz (81 kg)   SpO2 100%   BMI 24.21 kg/m?   ? ?CC: 4 mo f/u visit  ?Subjective:  ? ?HPI: ?Dylan [name] is a 88 y.o. male presenting on 01/11/2022 for Follow-up (Here for 4 mo f/u.  Accompanied by wife, [name]. She states palliative care is coming to the home on 01/14/22.) ? ? ?Upcoming palliative care home visit next week  ?Per palliative care, DNR status. Will need to confirm again as this differs from our last discussion.  ? ?Saw cardiology for h/o syncope and atrial flutter - no recent syncopal episodes.  ? ?Chronic constipation - on align, senakot, miralax bowel regimen. Notes ongoing constipation - has on average 1 formed stool per day. Has not used prescription constipation medication previously.  ?   ? ?Relevant past medical, surgical, family and social history reviewed and updated as indicated. Interim medical history since our last visit reviewed. ?Allergies and medications reviewed and updated. ?Outpatient Medications Prior to Visit  ?Medication Sig Dispense Refill  ? apixaban (ELIQUIS) 5 MG TABS tablet Take 1 tablet (5 mg total) by mouth 2 (two) times daily. 180 tablet 1  ? Cholecalciferol (VITAMIN D3) 1000 units CAPS Take 1 capsule (1,000 Units total) by mouth daily. 30 capsule   ? finasteride (PROSCAR) 5 MG tablet Take 1 tablet (5 mg total) by mouth daily. 90 tablet 3  ? memantine (NAMENDA XR) 14 MG CP24 24 hr capsule Take 1 capsule (14 mg total) by mouth daily. 90 capsule 3  ? midodrine (PROAMATINE) 5 MG tablet Take 5 mg by mouth 3 (three) times daily.    ? Polyethylene Glycol 3350 (MIRALAX PO) Take by mouth daily.    ? Probiotic Product (ALIGN PO) Take by mouth daily.    ? Sennosides (SENOKOT PO) Take by mouth daily.    ? sertraline (ZOLOFT) 100 MG tablet Take 1 tablet (100 mg  total) by mouth daily. 90 tablet 3  ? vitamin B-12 (V-R VITAMIN B-12) 500 MCG tablet Take 1 tablet (500 mcg total) by mouth daily.    ? feeding supplement (ENSURE ENLIVE / ENSURE PLUS) LIQD Take 237 mLs by mouth 2 (two) times daily between meals. 14220 mL 0  ? ?No facility-administered medications prior to visit.  ?  ? ?Per HPI unless specifically indicated in ROS section below ?Review of Systems ? ?Objective:  ?BP 126/68   Pulse (!) 55   Temp (!) 97.4 ?F (36.3 ?C) (Temporal)   Ht 6' (1.829 m)   Wt 178 lb 8 oz (81 kg)   SpO2 100%   BMI 24.21 kg/m?   ?Wt Readings from Last 3 Encounters:  ?01/11/22 178 lb 8 oz (81 kg)  ?10/08/21 181 lb (82.1 kg)  ?09/03/21 183 lb 6.4 oz (83.2 kg)  ?  ?  ?Physical Exam ?Vitals and nursing note reviewed.  ?Constitutional:   ?   Appearance: Normal appearance. He is not ill-appearing.  ?Cardiovascular:  ?   Rate and Rhythm: Regular rhythm. Bradycardia present.  ?   Pulses: Normal pulses.  ?   Heart sounds: Normal heart sounds. No murmur heard. ?Pulmonary:  ?   Effort: Pulmonary effort is normal. No respiratory distress.  ?  Breath sounds: Normal breath sounds. No wheezing, rhonchi or rales.  ?Musculoskeletal:  ?   Right lower leg: No edema.  ?   Left lower leg: No edema.  ?Skin: ?   General: Skin is warm and dry.  ?   Findings: No rash.  ?Psychiatric:     ?   Mood and Affect: Mood normal.     ?   Behavior: Behavior normal.  ? ?   ?Results for orders placed or performed in visit on 12/13/21  ?CUP PACEART REMOTE DEVICE CHECK  ?Result Value Ref Range  ? Date Time Interrogation Session [phone]   ? Pulse Insurance claims handler MERM   ? Pulse Gen Model [id number] Reveal LINQ   ? Pulse Gen Serial Number [id number]   ? Clinic Name [facility]   ? Implantable Pulse Generator Type ICM/ILR   ? Implantable Pulse Generator Implant Date [id number]   ? ? ?Assessment & Plan:  ?This visit occurred during the SARS-CoV-2 public health emergency.  Safety protocols were in place, including screening  questions prior to the visit, additional usage of staff PPE, and extensive cleaning of exam room while observing appropriate contact time as indicated for disinfecting solutions.  ? ?Problem List Items Addressed This Visit   ? ? DNR (do not resuscitate) (Chronic)  ?  Will need to confirm. ?  ?  ? Dementia with behavioral disturbance - Primary  ?  ALZ vs FTD, also with autonomic instability (orthostatic hypotension).  ?Wife is primary caregiver. Continue namenda XR (did not tolerate aricept)  ?  ?  ? Recurrent syncope  ?  No recent syncopal event. Continues midodrine 5mg  tid ?  ?  ? Constipation  ?  Bowel regimen consists of align, senakot, miralax.  ?  ?  ? Orthostatic hypotension  ?  Continue midodrine.  ?  ?  ? Bradycardia  ? Atrial flutter ([facility])  ?  New 09/2021.  ?Due to schedule EP f/u - wife will call for this.  ?  ?  ?  ? ?Meds ordered this encounter  ?Medications  ? feeding supplement (BOOST HIGH PROTEIN) LIQD  ?  Sig: Take 237 mLs by mouth daily as needed (about 3 times a week).  ?  Refill:  0  ? ?No orders of the defined types were placed in this encounter. ? ? ? ?Patient Instructions  ?Touch base with the electrophysiology office about follow up plan.  ?You are doing well today.  ?Trial allegra for allergies.  ?Return as needed or after 08/20/2022 for physical/wellness visit.  ? ?Follow up plan: ?Return in about 7 months (around 08/20/2022), or if symptoms worsen or fail to improve, for annual exam, prior fasting for blood work, medicare wellness visit. ? ?[name], MD   ?

## 2022-01-11 NOTE — Patient Instructions (Addendum)
Touch base with the electrophysiology office about follow up plan.  ?You are doing well today.  ?Trial allegra for allergies.  ?Return as needed or after 08/20/2022 for physical/wellness visit.  ?

## 2022-01-11 NOTE — Assessment & Plan Note (Signed)
Continue midodrine  

## 2022-01-11 NOTE — Assessment & Plan Note (Addendum)
Will need to confirm. ?

## 2022-01-14 ENCOUNTER — Other Ambulatory Visit: Payer: Self-pay

## 2022-01-14 ENCOUNTER — Ambulatory Visit (INDEPENDENT_AMBULATORY_CARE_PROVIDER_SITE_OTHER): Payer: Medicare Other

## 2022-01-14 ENCOUNTER — Other Ambulatory Visit: Payer: Medicare Other

## 2022-01-14 VITALS — BP 98/64 | HR 53 | Temp 97.1°F | Resp 18 | Wt 178.0 lb

## 2022-01-14 DIAGNOSIS — Z515 Encounter for palliative care: Secondary | ICD-10-CM

## 2022-01-14 DIAGNOSIS — R55 Syncope and collapse: Secondary | ICD-10-CM | POA: Diagnosis not present

## 2022-01-14 NOTE — Progress Notes (Signed)
PATIENT NAME: Dylan [name] ?DOB: 01-Feb-1934 ?MRN: [id number] ? ?PRIMARY CARE PROVIDER: [name], MD ? ?RESPONSIBLE PARTY:  ?Acct ID - Guarantor Home Phone Work Phone Relationship Acct Type  ?[id number] [name], [name]  Self P/F  ?   [address] [place], [place] [address]  ? ? ?PLAN OF CARE and INTERVENTIONS: ?              1.  GOALS OF CARE/ ADVANCE CARE PLANNING:  Advance Directive in place.  Discussed code status and MOST for with wife.  She would like to think more about this.  I have left a copy of [employer] Cho[employer] for [employer].  ?              2.  PATIENT/CAREGIVER EDUCATION:  Resources and disease progression. ?              4. PERSONAL EMERGENCY PLAN:  Activate 911 for emergencies.  ?              5.  DISEASE STATUS: ? ?Anorexia:  See weights below.  Patient's weight has been trending downward.  Wife endorses po intake has not been very good over the last 2-3 weeks.  He is eating 2 good meals most days.  It takes up to 2 hours to complete breakfast. Wife advises it takes 30-45 minutes for soups.  Occasional coughing with meals is noted by wife.  ? ? ?01/11/22 178 lb 8 oz (81 kg)  ?10/08/21 181 lb (82.1 kg)  ?09/03/21 183 lb 6.4 oz (83.2 kg)  ? ?Caregiver Fatigue:  Wife is the primary caregiver for patient.  She is taking patient to work with her 2 days a week.  She has not be able to do this in the last 1-2 months due to illnesses.  We discussed private caregivers.  Wife states she did have assistance in the home but the caregivers left for more days with another patient.  Advised that I would have [place], SW contact her regarding additional private duty options.  ? ?Dementia:  No behavioral issues are reported by wife.  Patient is incontinent of bowel and bladder.  Speech is limited to mostly yes/no responses or head nods.  Wife endorses maybe 10 words a day is spoken by patient.  Patient requires extensive assistance with ADL's and requires assistance to stand.  Able to ambulate  without assistive device once standing.  Ongoing education provided on disease progression.  ? ?Fall Risk:  Patient sustained a fall over the weekend.  Wife states patient's shoe was not on properly and patient fell in the kitchen when standing.  Bruising to left elbow.  No other injuries.  ? ?Resources:  Discussed [facility] as a possible option.  Contact information provided.  Discussed PACE program but wife would not want to change PCP's.  ? ? ?HISTORY OF PRESENT ILLNESS:   Dylan [name] is a 88 y.o. year old male  with multiple medical problems including dementia, CKD stage III, depression. Patient is being followed by Palliative Care every 4-8 weeks and PRN.  ? ?CODE STATUS: Full ?ADVANCED DIRECTIVES: Yes ?MOST FORM: No ?PPS: 40% ? ? ?PHYSICAL EXAM:  ? ?VITALS: ?Today's Vitals  ? 01/14/22 1331  ?BP: 98/64  ?Pulse: (!) 53  ?Resp: 18  ?Temp: (!) 97.1 ?F (36.2 ?C)  ?SpO2: 98%  ?Weight: 178 lb (80.7 kg)  ?PainSc: 0-No pain  ?  ?LUNGS: clear to auscultation  ?CARDIAC: Cor [place]} ?EXTREMITIES: -  for edema ?SKIN: Skin color, texture, turgor normal. No rashes or lesions or mobility and turgor normal  ?NEURO: positive for gait problems and memory problems ? ? ? ? ? ? ?Lorenza Burton, RN ? ?

## 2022-01-15 LAB — CUP PACEART REMOTE DEVICE CHECK
Date Time Interrogation Session: 20230306232821
Implantable Pulse Generator Implant Date: 20191120

## 2022-01-17 DIAGNOSIS — Z515 Encounter for palliative care: Secondary | ICD-10-CM

## 2022-01-17 NOTE — Progress Notes (Signed)
PC SW outreached patients spouse, [name], per [facility] RN request to assess patients needs.  ? ?Spouse shared she was interested in home caregiver support resources, however she did not have time to talk at the moment due to being on her way out the door. SW offered to call back at later time or send general caregiver support resources. Spouse requested the information be emailed to her at [email or web address]. SW will email the resources to spouse.  ? ?Spouse was encouraged to call with any additional questions and/or concerns. PC Provided general support and encouragement, no other unmet needs identified. ?

## 2022-01-21 ENCOUNTER — Telehealth: Payer: Self-pay

## 2022-01-21 ENCOUNTER — Telehealth: Payer: Self-pay | Admitting: Family Medicine

## 2022-01-21 DIAGNOSIS — Z0279 Encounter for issue of other medical certificate: Secondary | ICD-10-CM

## 2022-01-21 NOTE — Progress Notes (Unsigned)
° ° °  Chronic Care Management Pharmacy Assistant   Name: Dylan [name]  MRN: [id number] DOB: 08/20/1934  Reason for Encounter: CCM (General Adherence)   Recent office visits:  01/11/2022 - [name], MD - Patient presented for dementia with behavioral disturbance. Change: (BOOST HIGH PROTEIN) LIQD PRN vs. 2 times daily.  12/11/2021 - [name], MD - Telephone - Referral to palliative care due to ongoing caregiver stress.   Recent consult visits:  01/14/2022 - Palliative Care Home Visit 12/20/2021 - Palliative Care Home Visit 11/14/2021 [facility] - Patient presented for viral URI. Diagnoses: Conjunctivitis of both eyes. Start: trimethoprim-polymyxin b (POLYTRIM) ophthalmic solution in both eyes.   Hospital visits:  None in previous 6 months  Medications: Outpatient Encounter Medications as of 01/21/2022  Medication Sig   apixaban (ELIQUIS) 5 MG TABS tablet Take 1 tablet (5 mg total) by mouth 2 (two) times daily.   Cholecalciferol (VITAMIN D3) 1000 units CAPS Take 1 capsule (1,000 Units total) by mouth daily.   feeding supplement (BOOST HIGH PROTEIN) LIQD Take 237 mLs by mouth daily as needed (about 3 times a week).   finasteride (PROSCAR) 5 MG tablet Take 1 tablet (5 mg total) by mouth daily.   memantine (NAMENDA XR) 14 MG CP24 24 hr capsule Take 1 capsule (14 mg total) by mouth daily.   midodrine (PROAMATINE) 5 MG tablet Take 5 mg by mouth 3 (three) times daily.   Polyethylene Glycol 3350 (MIRALAX PO) Take by mouth daily.   Probiotic Product (ALIGN PO) Take by mouth daily.   Sennosides (SENOKOT PO) Take by mouth daily.   sertraline (ZOLOFT) 100 MG tablet Take 1 tablet (100 mg total) by mouth daily.   vitamin B-12 (V-R VITAMIN B-12) 500 MCG tablet Take 1 tablet (500 mcg total) by mouth daily.   No facility-administered encounter medications on file as of 01/21/2022.   Contacted Keevon R Bryars on *** for general disease state and medication adherence call.    Star Medications: Medication Name/mg Last Fill Days Supply No star rating medications noted  RESCHEDULE PT FROM June APPT TO DECEMBER  What concerns do you have about your medications?  The patient {denies/reports:25180} side effects with their medications.   How often do you forget or accidentally miss a dose? {missed doses:25554}  Do you use a pillbox? {yes/no:20286}  Are you having any problems getting your medications from your pharmacy? {yes/no:20286}  Has the cost of your medications been a concern? {yes/no:20286} If yes, what medication and is patient assistance available or has it been applied for?  Since last visit with CPP, the following interventions have been made.  Change: (BOOST HIGH PROTEIN) LIQD PRN vs. 2 times daily.  Palliative home care has been coming in to the home.  The patient has not had an ED visit since last contact.   The patient {denies/reports:25180} problems with their health.   Patient {denies/reports:25180} concerns or questions for [name], PharmD at this time.   Care Gaps: Annual wellness visit in last year? Yes 08/20/2021 Most Recent BP reading: 98/64 on 01/14/2022  Upcoming appointments: CCM appointment on 04/11/2022 with Pecola Leisure, CPP notified  Marijean Niemann, [place] Clinical Pharmacy Assistant [phone]  LM 03/13

## 2022-01-21 NOTE — Telephone Encounter (Signed)
Pt spouse dropped off paperwork ? ? ?Type of forms received:medical info ? ?Routed to: ?lisa ?Paperwork received by :  ?terrill ? ?Individual made aware of 3-5 business day turn around (Y/N):y ? ?Form completed and patient made aware of charges(Y/N):y ? ? ?Faxed to :  ? ?Form location:  dr g box ?

## 2022-01-22 ENCOUNTER — Ambulatory Visit (LOCAL_COMMUNITY_HEALTH_CENTER): Payer: Self-pay

## 2022-01-22 ENCOUNTER — Other Ambulatory Visit: Payer: Self-pay

## 2022-01-22 DIAGNOSIS — Z0279 Encounter for issue of other medical certificate: Secondary | ICD-10-CM

## 2022-01-22 DIAGNOSIS — Z111 Encounter for screening for respiratory tuberculosis: Secondary | ICD-10-CM

## 2022-01-22 NOTE — Telephone Encounter (Signed)
Paper work is in your in box

## 2022-01-22 NOTE — Telephone Encounter (Signed)
Filled and in Lisa's box.  ?Awaiting results of PPD placed today - see EMR.  ?

## 2022-01-23 NOTE — Telephone Encounter (Signed)
Form is in basket on Lisa's desk. ?

## 2022-01-24 ENCOUNTER — Ambulatory Visit: Payer: Medicare Other | Admitting: Cardiology

## 2022-01-24 NOTE — Progress Notes (Signed)
Carelink Summary Report / Loop Recorder 

## 2022-01-25 ENCOUNTER — Ambulatory Visit (LOCAL_COMMUNITY_HEALTH_CENTER): Payer: Self-pay

## 2022-01-25 ENCOUNTER — Other Ambulatory Visit: Payer: Self-pay

## 2022-01-25 DIAGNOSIS — Z111 Encounter for screening for respiratory tuberculosis: Secondary | ICD-10-CM

## 2022-01-25 LAB — TB SKIN TEST
Induration: 0 mm
TB Skin Test: NEGATIVE

## 2022-01-29 NOTE — Telephone Encounter (Signed)
Completed pt's form documenting TB results.  Notified pt's POA/wife, [name], that form is ready to pick up and there is a $20.00 fee.  She verbalizes understanding.  ? ?[Made copy to scan/billing.]  ?

## 2022-02-14 ENCOUNTER — Ambulatory Visit (INDEPENDENT_AMBULATORY_CARE_PROVIDER_SITE_OTHER): Payer: Medicare Other

## 2022-02-14 ENCOUNTER — Telehealth: Payer: Self-pay

## 2022-02-14 DIAGNOSIS — R55 Syncope and collapse: Secondary | ICD-10-CM

## 2022-02-14 LAB — CUP PACEART REMOTE DEVICE CHECK
Date Time Interrogation Session: 20230405231942
Implantable Pulse Generator Implant Date: 20191120

## 2022-02-14 NOTE — Progress Notes (Signed)
? ? ?  Chronic Care Management ?Pharmacy Assistant  ? ?Name: Dylan [name]  MRN: [id number] DOB: 1934/10/09 ? ?Reason for Encounter: CCM (Reschedule Appointment) ?  ?Patient has been rescheduled for a telephone appointment with [name] on 10/22/2022 at 11:00 am. ? ?[name], CPP notified ? ?Marijean Niemann, RMA ?Clinical Pharmacy Assistant ?[phone] ? ? ?

## 2022-02-16 ENCOUNTER — Telehealth: Payer: Self-pay | Admitting: Family Medicine

## 2022-02-16 NOTE — Telephone Encounter (Signed)
Pt has upcoming appt Tuesday 8:30am for "update zoloft" - this was increased to '100mg'$  in October due to worsening disinhibition. What issue are they having? May not need OV.  ?

## 2022-02-18 NOTE — Telephone Encounter (Signed)
Spoke to patient's wife (on [place]) and was advised that the increase in Zoloft is not helping much. Patient's wife stated that his public inhibition is getting worse. Patient's wife stated that he is having restless sleep. Patient's wife stated that she felt that he needed to be seen to discuss other options. ?

## 2022-02-19 ENCOUNTER — Encounter: Payer: Self-pay | Admitting: Family Medicine

## 2022-02-19 ENCOUNTER — Ambulatory Visit (INDEPENDENT_AMBULATORY_CARE_PROVIDER_SITE_OTHER): Payer: Medicare Other | Admitting: Family Medicine

## 2022-02-19 VITALS — BP 122/70 | HR 52 | Temp 97.8°F | Ht 72.0 in | Wt 178.2 lb

## 2022-02-19 DIAGNOSIS — F03918 Unspecified dementia, unspecified severity, with other behavioral disturbance: Secondary | ICD-10-CM | POA: Diagnosis not present

## 2022-02-19 DIAGNOSIS — F66 Other sexual disorders: Secondary | ICD-10-CM

## 2022-02-19 MED ORDER — FEXOFENADINE HCL 180 MG PO TABS: 180.0000 mg | ORAL_TABLET | Freq: Every day | ORAL | Status: DC

## 2022-02-19 MED ORDER — FLUOXETINE HCL 20 MG PO CAPS: 20.0000 mg | ORAL_CAPSULE | Freq: Every day | ORAL | 6 refills | Status: DC

## 2022-02-19 NOTE — Assessment & Plan Note (Deleted)
Confirmed with wife

## 2022-02-19 NOTE — Progress Notes (Signed)
? ? Patient ID: Dylan [name], male    DOB: Oct 01, 1934, 88 y.o.   MRN: [id number] ? ?This visit was conducted in person. ? ?BP 122/70   Pulse (!) 52   Temp 97.8 ?F (36.6 ?C) (Temporal)   Ht 6' (1.829 m)   Wt 178 lb 4 oz (80.9 kg)   SpO2 98%   BMI 24.18 kg/m?   ? ?CC: discuss zoloft/ disinhibition ?Subjective:  ? ?HPI: ?Dylan [name] is a 88 y.o. male presenting on 02/19/2022 for Mood  (Here for Zoloft f/u. Pt accompanied by wife, [name]. ) ? ? ?Known significant dementia presumed alzheimer's vs FTD with autonomic instability and bowel/bladder incontinence. Fully dependent on ADLs/IADLs. On Namenda XR '14mg'$  daily, aricept avoided due to cardiac conduction issues. Continues midodrine '5mg'$  TID, occasionally misses lunch dose.  ? ?Sertraline started 11/2020 for sexual disinhibition with initial benefit. Titrated to '100mg'$  08/2021 dose due to ongoing symptoms.  ? ?Over last 3 weeks wife noticing worsening public disinhibition recurring - "playing with himself", staring at people until they get angry - as well as restless sleep.  ? ?Initial hyperorality, now wife notes decreased appetite.  ?She just picked up 90d supply of namenda XR '14mg'$ .  ? ?H/o intolerance to doxepin due to oversedation even at low doses.  ? ?Palliative care RN came out to house last month, they are in process of looking into further personal care services for home.  ? ?Constipation - bowel regimen consists of align, senakot, miralax. Formed stool every other day.  ?Just started Dementia program at [facility].  ? ?2 falls in the past month - both times getting up from dining room table.  ?   ? ?Relevant past medical, surgical, family and social history reviewed and updated as indicated. Interim medical history since our last visit reviewed. ?Allergies and medications reviewed and updated. ?Outpatient Medications Prior to Visit  ?Medication Sig Dispense Refill  ? apixaban (ELIQUIS) 5 MG TABS tablet Take 1 tablet (5 mg total) by mouth 2 (two) times  daily. 180 tablet 1  ? Cholecalciferol (VITAMIN D3) 1000 units CAPS Take 1 capsule (1,000 Units total) by mouth daily. 30 capsule   ? feeding supplement (BOOST HIGH PROTEIN) LIQD Take 237 mLs by mouth daily as needed (about 3 times a week).  0  ? finasteride (PROSCAR) 5 MG tablet Take 1 tablet (5 mg total) by mouth daily. 90 tablet 3  ? midodrine (PROAMATINE) 5 MG tablet Take 5 mg by mouth 3 (three) times daily.    ? Polyethylene Glycol 3350 (MIRALAX PO) Take by mouth daily.    ? Probiotic Product (ALIGN PO) Take by mouth daily.    ? Sennosides (SENOKOT PO) Take by mouth daily.    ? vitamin B-12 (V-R VITAMIN B-12) 500 MCG tablet Take 1 tablet (500 mcg total) by mouth daily.    ? memantine (NAMENDA XR) 14 MG CP24 24 hr capsule Take 1 capsule (14 mg total) by mouth daily. 90 capsule 3  ? sertraline (ZOLOFT) 100 MG tablet Take 1 tablet (100 mg total) by mouth daily. 90 tablet 3  ? memantine (NAMENDA XR) 14 MG CP24 24 hr capsule Take 1 capsule (14 mg total) by mouth every other day.    ? ?No facility-administered medications prior to visit.  ?  ? ?Per HPI unless specifically indicated in ROS section below ?Review of Systems ? ?Objective:  ?BP 122/70   Pulse (!) 52   Temp 97.8 ?F (36.6 ?C) (Temporal)  Ht 6' (1.829 m)   Wt 178 lb 4 oz (80.9 kg)   SpO2 98%   BMI 24.18 kg/m?   ?Wt Readings from Last 3 Encounters:  ?02/19/22 178 lb 4 oz (80.9 kg)  ?01/14/22 178 lb (80.7 kg)  ?01/11/22 178 lb 8 oz (81 kg)  ?  ?  ?Physical Exam ?Vitals and nursing note reviewed.  ?Constitutional:   ?   Appearance: Normal appearance. He is not ill-appearing.  ?Cardiovascular:  ?   Rate and Rhythm: Normal rate and regular rhythm.  ?   Pulses: Normal pulses.  ?   Heart sounds: Normal heart sounds. No murmur heard. ?Pulmonary:  ?   Effort: Pulmonary effort is normal. No respiratory distress.  ?   Breath sounds: Normal breath sounds. No wheezing, rhonchi or rales.  ?Musculoskeletal:  ?   Right lower leg: No edema.  ?   Left lower leg: No  edema.  ?Skin: ?   General: Skin is warm and dry.  ?   Findings: No rash.  ?Neurological:  ?   Mental Status: He is alert.  ? ?   ? ?Assessment & Plan:  ? ?Problem List Items Addressed This Visit   ? ? Dementia with behavioral disturbance  ?  Anticipate progressing bvFTD, r/o Alz. Discussed trying off namenda if truly FTD.  ?Initial hyperorality, now appetite decreasing. Weight stable.  ?They just filled 90d supply of Namenda XR '14mg'$ . Will drop dose to every other day given long half life.  ?RTC 4-6 wks f/u visit.  ?  ?  ? Relevant Medications  ? memantine (NAMENDA XR) 14 MG CP24 24 hr capsule  ? FLUoxetine (PROZAC) 20 MG capsule  ? Sexual disinhibition - Primary  ?  Worsening despite sertraline '100mg'$  daily. ?Will change to prozac '20mg'$  daily. ?Will taper Namenda XR dose as per below.  ?If no improvement, discussed neurology eval.  ?  ?  ?  ? ?Meds ordered this encounter  ?Medications  ? FLUoxetine (PROZAC) 20 MG capsule  ?  Sig: Take 1 capsule (20 mg total) by mouth daily.  ?  Dispense:  30 capsule  ?  Refill:  6  ?  To replace sertraline  ? ?No orders of the defined types were placed in this encounter. ? ? ? ?Patient instructions: ?Let's try lower namenda dose - take '14mg'$  pill every other day for 1 month, then update me with effect.  ?Change sertraline to prozac '20mg'$  daily. May stop one one day and start the other the next day.  ?Return in 4-6 weeks for follow up visit. ? ?Follow up plan: ?Return in about 6 weeks (around 04/02/2022) for follow up visit. ? ?[name], MD   ?

## 2022-02-19 NOTE — Patient Instructions (Addendum)
Let's try lower namenda dose - take '14mg'$  pill every other day for 1 month, then update me with effect.  ?Change sertraline to prozac '20mg'$  daily. May stop one one day and start the other the next day.  ?Return in 4-6 weeks for follow up visit. ? ?Frontotemporal Dementia ?Frontotemporal dementia (FTD), sometimes called semantic dementia or Pick's disease, is a progressive brain disorder that causes memory loss. FTD describes a range of diseases that often start with changes in behavior, speech, and thinking. As FTD progresses, it affects short-term memory. Over time, FTD causes the frontal and temporal lobes of the brain to shrink. These are the parts of the brain that control behavior and speech. ?There are multiple types of FTD, including: ?Behavioral variant FTD. This is the most common type. ?Primary progressive aphasia. ?Frontotemporal dementia with motor neuron disease. ?FTD is one of the most common causes of progressive memory loss in people younger than age 13. The disease progresses faster in some people than in others. In some families, FTD can be associated with amyotrophic lateral sclerosis (ALS). There is no cure for FTD, but treatment and supportive care can improve a person's quality of life. ?What are the causes? ?The exact cause of this condition is not known, although many genetic changes (mutations) are known to cause the disease. ?What increases the risk? ?This condition is more likely to occur in people who have a family history of FTD. Family members of people with FTD should think about genetic counseling. ?What are the signs or symptoms? ?Symptoms of FTD usually start when a person is 103-72 years of age. Symptoms may include: ?Impulsive, poorly controlled, inappropriate, or embarrassing behavior. ?Lack of motivation and interest (apathy). ?Irritability and agitation. ?Neglect of personal hygiene. ?Withdrawal. ?Inappropriate crying or laughing (pseudobulbar affect). ?Repetitive  behaviors. ?Impulsive eating. ?Lack of concern for others. ?Failure to recognize behavioral changes. ?Speech problems, such as: ?Loss of the ability to speak fluently. ?Slurred speech. ?Loss of vocabulary. ?Inability to write or read. ?New drug or alcohol abuse. ?Short-term memory loss is also a symptom later in the disease. ?How is this diagnosed? ?Your health care provider may suspect FTD if you have worsening behavior or speech difficulties. You may need to see specialists in brain and behavioral health who will collect your medical history and do a neurological exam. The following tests may be done: ?Blood tests to rule out other causes, such as vitamin deficiency, harmful effects of substances (toxicities), and infections. ?Spinal tap (lumbar puncture) to check spinal fluid samples for abnormal proteins. ?Imaging tests, such as a CT scan, a PET scan, or an MRI. These can show brain changes that suggest FTD or another brain disorder. ?Memory testing (neuropsychological testing), which involves several hours of standardized tests to check the many functions of the brain. ?How is this treated? ?There is no cure for this condition, and the progression of FTD cannot be stopped. Support at home is the most important aspect of managing FTD. Ask about caregiving resources in your community. ?Management of this condition may include: ?Antidepressant medicines to help with apathy. ?Medicines to treat pseudobulbar affect. ?Sedative medicines to control aggressive or dangerous behavior. ?Speech and language therapy. ?Behavioral therapy. ?Occupational therapy to help with home safety and activity. ?Institutional or supportive care may eventually be needed. ?Follow these instructions at home: ?Eating and drinking ?Follow a healthy diet. Eat foods that are high in fiber, such as beans, whole grains, and fresh fruits and vegetables. ?Avoid having too much sugar and caffeine  in your diet. ?Watch for signs of compulsive eating,  which can lead to other health problems. ?Do not drink alcohol. ?Drink enough fluid to keep your urine pale yellow. ?Lifestyle ?Keep a regular routine. ?Avoid stress and new situations. ?Avoid any activities that may trigger aggressive behavior. ?Schedule regular, enjoyable, and supervised physical activity. ?General instructions ?Take over-the-counter and prescription medicines only as told by your health care provider. ?If you were given a bracelet that tracks your location, make sure you wear it. ?Work with your health care provider to determine what you need help with and what your safety needs are. ?Keep all follow-up visits, including any therapy visits. This is important. ?Where to find more information ?[employer] of Neurological Disorders and Stroke: MasterBoxes.it ?Contact a health care provider if: ?Your symptoms change or get worse. ?It becomes more difficult or stressful to be cared for at home. ?Get help right away if: ?It is no longer possible to be cared for at home. ?You or your family members become concerned for your safety. ?You threaten yourself or others. ?If you ever feel like you may hurt yourself or others, or have thoughts about taking your own life, get help right away. Go to your nearest emergency department or: ?Call your local emergency services (911 in the [place] ?Call a suicide crisis helpline, such as the [place] at (204)565-3234 or 988 in the [place]. This is open 24 hours a day in the [place] ?Text the Crisis Text Line at 484-685-5563 (in the [place].). ?Summary ?Frontotemporal dementia (FTD) is a progressive brain disorder that causes memory loss. ?There is no cure for this condition, and the progression of FTD cannot be stopped. ?Support at home is the most important aspect of managing FTD. Ask about caregiving resources in your community. ?Work with your health care provider to determine what you need help with and what your safety needs are. ?This  information is not intended to replace advice given to you by your health care provider. Make sure you discuss any questions you have with your health care provider. ?Document Revised: 05/23/2021 Document Reviewed: 03/13/2020 ?Elsevier Patient Education ? [place]. ? ?

## 2022-02-19 NOTE — Addendum Note (Signed)
Addended by: [name] on: 02/19/2022 09:42 AM ? ? Modules accepted: Orders ? ?

## 2022-02-19 NOTE — Assessment & Plan Note (Addendum)
Anticipate progressing bvFTD, r/o Alz. Discussed trying off namenda if truly FTD.  ?Initial hyperorality, now appetite decreasing. Weight stable.  ?They just filled 90d supply of Namenda XR '14mg'$ . Will drop dose to every other day given long half life.  ?RTC 4-6 wks f/u visit.  ?

## 2022-02-19 NOTE — Assessment & Plan Note (Addendum)
Worsening despite sertraline '100mg'$  daily. ?Will change to prozac '20mg'$  daily. ?Will taper Namenda XR dose as per below.  ?If no improvement, discussed neurology eval.  ?

## 2022-02-26 ENCOUNTER — Encounter: Payer: Self-pay | Admitting: Nurse Practitioner

## 2022-02-26 ENCOUNTER — Other Ambulatory Visit: Payer: Medicare Other | Admitting: Nurse Practitioner

## 2022-02-26 VITALS — BP 122/70 | HR 60 | Temp 97.3°F | Resp 22 | Wt 178.4 lb

## 2022-02-26 DIAGNOSIS — F02818 Dementia in other diseases classified elsewhere, unspecified severity, with other behavioral disturbance: Secondary | ICD-10-CM

## 2022-02-26 DIAGNOSIS — Z515 Encounter for palliative care: Secondary | ICD-10-CM

## 2022-02-26 NOTE — Progress Notes (Signed)
? ? ?Manufacturing engineer ?Community Palliative Care Consult Note ?Telephone: [phone]  ?Fax: [phone]  ? ? ?Date of encounter: 02/26/22 ?1:32 PM ?PATIENT NAME: Dylan [name] ?[place] [address]   ?[id number] (home)  ?DOB: [birth year] ?MRN: [id number] ?PRIMARY CARE PROVIDER:    ?[name], MD,  ?[address] [place] [address] ?[phone] ? ?RESPONSIBLE PARTY:    ?Contact Information   ? ? Name Relation Home Work Mobile  ? Dylan [name], Dylan [name] Spouse [phone]  [phone]  ? ?  ?I connected virtually with Dylan Gather, [name] and Dylan [name], wife with Dylan [name] on 02/26/22 by a video enabled telemedicine application and verified that I am speaking with the correct person using two identifiers. ?  ?I discussed the limitations of evaluation and management by telemedicine. The patient expressed understanding and agreed to proceed.  ? ?Palliative Care was asked to follow this patient by consultation request of  [name], MD to address advance care planning and complex medical decision making. This is a follow up visit.                                  ?ASSESSMENT AND PLAN / RECOMMENDATIONS:  ?Advance Care Planning/Goals of Care: Goals include to maximize quality of life and symptom management. Patient/health care surrogate gave his/her permission to discuss. ?Our advance care planning conversation included a discussion about:    ?The value and importance of advance care planning  ?Experiences with loved ones who have been seriously ill or have died  ?Exploration of personal, cultural or spiritual beliefs that might influence medical decisions  ?Exploration of goals of care in the event of a sudden injury or illness  ?Identification of a healthcare agent-wife Dylan [name] ?Review of an  advance directive document . ?Decision not to resuscitate  due to poor prognosis. ?CODE STATUS: DNR-Uploaded to Vynca ? ?Symptom Management/Plan: ? ?ACP:  Discussed GOC with  wife-Dylan [name].  She confirms DNR status based on patient's living will.  2 golden forms left in the home with [facility] and one will be taken to [facility].  ? ?2.   Adult Day Program:  Melaeady received resources in the mail from [place] and was able enroll patient in a day program through [facility].  Patient is participating in the program 2 days a week for 6 hours each day. Dylan [name] voiced her appreciation for the information as this has allowed her to have some respite time.  Patient endorses he is enjoying his time there.  ? ?3. Dementia with Behavioral Disturbances:  Patient was seen by PCP due to behavioral issues.  Medication adjustments were made but Dylan [name] advised this only occurred 3 days ago.  She has seen improvement thus far and will follow up with PCP office next month.  Patient engages in conversation with simple answers.  Dylan [name] endorses bowel/bladder incontinence and she continues to assist with grooming and hygiene.   ? ?4.  Falls:  Dylan [name] endorses 2 unassisted falls and 1 assisted fall in the last month.  Patient occasionally requires assistance with standing but once standing is able to ambulate with standby assistance.  ? ?Follow up Palliative Care Visit: Palliative care will continue to follow for complex medical decision making, advance care planning, and clarification of goals. Return 4-6 weeks or prn. ? ?I spent 43 minutes providing this consultation. More than 50% of the time in this consultation was  spent in counseling and care coordination ? ?PPS: 40% ? ?HOSPICE ELIGIBILITY/DIAGNOSIS: TBD ? ?Chief Complaint: Follow up palliative consult for complex medical decision making ? ?HISTORY OF PRESENT ILLNESS:  Dylan [name] is a 88 y.o. year old male  with including dementia, CKD stage III, depression. ? ?History obtained from review of EMR, discussion with primary team, and interview with family, facility staff/caregiver and/or Dylan [name].  ?I reviewed available labs, medications,  imaging, studies and related documents from the EMR.  Records reviewed and summarized above.  ? ?ROS ?General: NAD ?EYES: denies vision changes ?ENMT: + dysphagia ?Cardiovascular: denies chest pain, denies DOE ?Pulmonary: + cough, denies increased SOB ?Abdomen: endorses good appetite, denies constipation, endorses incontinence of bowel ?GU: denies dysuria, endorses incontinence of urine ?MSK:  denies increased weakness,  +  falls reported ?Skin: denies rashes or wounds ?Neurological: denies pain, denies insomnia ?Psych: Endorses positive mood ?Heme/lymph/immuno: denies bruises, abnormal bleeding ? ?Physical Exam: per Dylan [name] PC ?Current: 178 lbs 6.4 oz ?Constitutional: NAD ?General: Thin appearing ?EYES: anicteric sclera, lids intact, no discharge  ?ENMT: intact hearing, oral mucous membranes moist, dentition intact ?CV: RRR, no LE edema ?Pulmonary: LCTA, no increased work of breathing, + cough-improved with start of Allegra,  room air ?Abdomen: intake 75-100%, normo-active BS + 4 quadrants, soft and non tender, no ascites ?GU: deferred ?MSK: no sarcopenia, moves all extremities, ambulatory ?Skin: warm and dry, no rashes or wounds on visible skin ?Neuro:  + generalized weakness,  + cognitive impairment ?Psych: non-anxious affect, A and O x 1 ?Hem/lymph/immuno: no widespread bruising ?Thank you for the opportunity to participate in the care of Mr. Lipsey.  The palliative care team will continue to follow. Please call our office at [phone] if we can be of additional assistance.  ? ?Dylan Burton, [name]  ?

## 2022-03-01 NOTE — Progress Notes (Signed)
Carelink Summary Report / Loop Recorder 

## 2022-03-14 ENCOUNTER — Ambulatory Visit: Payer: Medicare Other | Admitting: Cardiology

## 2022-03-16 ENCOUNTER — Observation Stay
Admission: EM | Admit: 2022-03-16 | Discharge: 2022-03-18 | Disposition: A | Discharge status: Home / Self Care | Payer: Medicare Other | Attending: Internal Medicine | Admitting: Internal Medicine

## 2022-03-16 ENCOUNTER — Emergency Department
Admission: EM | Admit: 2022-03-16 | Discharge: 2022-03-16 | Disposition: A | Discharge status: Home / Self Care | Payer: Medicare Other | Source: Home / Self Care | Attending: Emergency Medicine | Admitting: Emergency Medicine

## 2022-03-16 ENCOUNTER — Emergency Department: Payer: Medicare Other

## 2022-03-16 ENCOUNTER — Other Ambulatory Visit: Payer: Self-pay

## 2022-03-16 DIAGNOSIS — H9193 Unspecified hearing loss, bilateral: Secondary | ICD-10-CM | POA: Diagnosis not present

## 2022-03-16 DIAGNOSIS — M5136 Other intervertebral disc degeneration, lumbar region: Secondary | ICD-10-CM | POA: Diagnosis not present

## 2022-03-16 DIAGNOSIS — N400 Enlarged prostate without lower urinary tract symptoms: Secondary | ICD-10-CM | POA: Diagnosis not present

## 2022-03-16 DIAGNOSIS — I499 Cardiac arrhythmia, unspecified: Secondary | ICD-10-CM | POA: Diagnosis not present

## 2022-03-16 DIAGNOSIS — E785 Hyperlipidemia, unspecified: Secondary | ICD-10-CM | POA: Diagnosis not present

## 2022-03-16 DIAGNOSIS — Z85828 Personal history of other malignant neoplasm of skin: Secondary | ICD-10-CM | POA: Insufficient documentation

## 2022-03-16 DIAGNOSIS — R059 Cough, unspecified: Secondary | ICD-10-CM | POA: Insufficient documentation

## 2022-03-16 DIAGNOSIS — U071 COVID-19: Principal | ICD-10-CM | POA: Insufficient documentation

## 2022-03-16 DIAGNOSIS — I493 Ventricular premature depolarization: Secondary | ICD-10-CM | POA: Diagnosis not present

## 2022-03-16 DIAGNOSIS — F0390 Unspecified dementia without behavioral disturbance: Secondary | ICD-10-CM | POA: Insufficient documentation

## 2022-03-16 DIAGNOSIS — R531 Weakness: Secondary | ICD-10-CM | POA: Diagnosis present

## 2022-03-16 DIAGNOSIS — R42 Dizziness and giddiness: Secondary | ICD-10-CM | POA: Diagnosis not present

## 2022-03-16 DIAGNOSIS — I739 Peripheral vascular disease, unspecified: Secondary | ICD-10-CM | POA: Diagnosis not present

## 2022-03-16 DIAGNOSIS — G319 Degenerative disease of nervous system, unspecified: Secondary | ICD-10-CM | POA: Insufficient documentation

## 2022-03-16 DIAGNOSIS — Y92002 Bathroom of unspecified non-institutional (private) residence single-family (private) house as the place of occurrence of the external cause: Secondary | ICD-10-CM | POA: Insufficient documentation

## 2022-03-16 DIAGNOSIS — R55 Syncope and collapse: Secondary | ICD-10-CM

## 2022-03-16 DIAGNOSIS — N183 Chronic kidney disease, stage 3 unspecified: Secondary | ICD-10-CM | POA: Diagnosis not present

## 2022-03-16 DIAGNOSIS — Z20822 Contact with and (suspected) exposure to covid-19: Secondary | ICD-10-CM | POA: Diagnosis not present

## 2022-03-16 DIAGNOSIS — R9431 Abnormal electrocardiogram [ECG] [EKG]: Secondary | ICD-10-CM | POA: Insufficient documentation

## 2022-03-16 DIAGNOSIS — R112 Nausea with vomiting, unspecified: Secondary | ICD-10-CM | POA: Insufficient documentation

## 2022-03-16 DIAGNOSIS — F32A Depression, unspecified: Secondary | ICD-10-CM | POA: Insufficient documentation

## 2022-03-16 DIAGNOSIS — R6889 Other general symptoms and signs: Secondary | ICD-10-CM | POA: Diagnosis not present

## 2022-03-16 DIAGNOSIS — Z66 Do not resuscitate: Secondary | ICD-10-CM | POA: Diagnosis not present

## 2022-03-16 DIAGNOSIS — R7989 Other specified abnormal findings of blood chemistry: Secondary | ICD-10-CM | POA: Diagnosis not present

## 2022-03-16 DIAGNOSIS — W19XXXA Unspecified fall, initial encounter: Secondary | ICD-10-CM

## 2022-03-16 DIAGNOSIS — F0393 Unspecified dementia, unspecified severity, with mood disturbance: Secondary | ICD-10-CM | POA: Insufficient documentation

## 2022-03-16 DIAGNOSIS — Z043 Encounter for examination and observation following other accident: Secondary | ICD-10-CM | POA: Diagnosis not present

## 2022-03-16 DIAGNOSIS — Z743 Need for continuous supervision: Secondary | ICD-10-CM | POA: Diagnosis not present

## 2022-03-16 DIAGNOSIS — R509 Fever, unspecified: Secondary | ICD-10-CM | POA: Insufficient documentation

## 2022-03-16 DIAGNOSIS — D696 Thrombocytopenia, unspecified: Secondary | ICD-10-CM | POA: Diagnosis not present

## 2022-03-16 DIAGNOSIS — R0689 Other abnormalities of breathing: Secondary | ICD-10-CM | POA: Diagnosis not present

## 2022-03-16 DIAGNOSIS — Z7901 Long term (current) use of anticoagulants: Secondary | ICD-10-CM | POA: Diagnosis not present

## 2022-03-16 DIAGNOSIS — Z87891 Personal history of nicotine dependence: Secondary | ICD-10-CM | POA: Insufficient documentation

## 2022-03-16 DIAGNOSIS — W182XXA Fall in (into) shower or empty bathtub, initial encounter: Secondary | ICD-10-CM | POA: Insufficient documentation

## 2022-03-16 DIAGNOSIS — Z9181 History of falling: Secondary | ICD-10-CM | POA: Diagnosis not present

## 2022-03-16 DIAGNOSIS — I452 Bifascicular block: Secondary | ICD-10-CM | POA: Insufficient documentation

## 2022-03-16 DIAGNOSIS — R232 Flushing: Secondary | ICD-10-CM | POA: Insufficient documentation

## 2022-03-16 DIAGNOSIS — I129 Hypertensive chronic kidney disease with stage 1 through stage 4 chronic kidney disease, or unspecified chronic kidney disease: Secondary | ICD-10-CM | POA: Insufficient documentation

## 2022-03-16 DIAGNOSIS — M6281 Muscle weakness (generalized): Secondary | ICD-10-CM | POA: Insufficient documentation

## 2022-03-16 DIAGNOSIS — F0392 Unspecified dementia, unspecified severity, with psychotic disturbance: Secondary | ICD-10-CM | POA: Insufficient documentation

## 2022-03-16 DIAGNOSIS — I4892 Unspecified atrial flutter: Secondary | ICD-10-CM | POA: Diagnosis not present

## 2022-03-16 DIAGNOSIS — Z79899 Other long term (current) drug therapy: Secondary | ICD-10-CM | POA: Insufficient documentation

## 2022-03-16 DIAGNOSIS — I4891 Unspecified atrial fibrillation: Secondary | ICD-10-CM | POA: Diagnosis not present

## 2022-03-16 DIAGNOSIS — R2681 Unsteadiness on feet: Secondary | ICD-10-CM | POA: Insufficient documentation

## 2022-03-16 DIAGNOSIS — I491 Atrial premature depolarization: Secondary | ICD-10-CM | POA: Insufficient documentation

## 2022-03-16 DIAGNOSIS — S0990XA Unspecified injury of head, initial encounter: Secondary | ICD-10-CM | POA: Diagnosis not present

## 2022-03-16 DIAGNOSIS — Z8249 Family history of ischemic heart disease and other diseases of the circulatory system: Secondary | ICD-10-CM | POA: Insufficient documentation

## 2022-03-16 LAB — BASIC METABOLIC PANEL
Anion gap: 11 (ref 5–15)
Anion gap: 7 (ref 5–15)
BUN: 19 mg/dL (ref 8–23)
BUN: 22 mg/dL (ref 8–23)
CO2: 23 mmol/L (ref 22–32)
CO2: 26 mmol/L (ref 22–32)
Calcium: 9.2 mg/dL (ref 8.9–10.3)
Calcium: 9.2 mg/dL (ref 8.9–10.3)
Chloride: 104 mmol/L (ref 98–111)
Chloride: 106 mmol/L (ref 98–111)
Creatinine, Ser: 1.44 mg/dL — ABNORMAL HIGH (ref 0.61–1.24)
Creatinine, Ser: 1.46 mg/dL — ABNORMAL HIGH (ref 0.61–1.24)
GFR, Estimated: 47 mL/min — ABNORMAL LOW (ref >60)
GFR, Estimated: 47 mL/min — ABNORMAL LOW (ref >60)
Glucose, Bld: 114 mg/dL — ABNORMAL HIGH (ref 70–99)
Glucose, Bld: 134 mg/dL — ABNORMAL HIGH (ref 70–99)
Potassium: 4.2 mmol/L (ref 3.5–5.1)
Potassium: 4.4 mmol/L (ref 3.5–5.1)
Sodium: 138 mmol/L (ref 135–145)
Sodium: 139 mmol/L (ref 135–145)

## 2022-03-16 LAB — TROPONIN I (HIGH SENSITIVITY): Troponin I (High Sensitivity): 6 ng/L (ref <18)

## 2022-03-16 LAB — CBC
HCT: 46 % (ref 39.0–52.0)
HCT: 45 % (ref 39.0–52.0)
Hemoglobin: 15.7 g/dL (ref 13.0–17.0)
Hemoglobin: 15.2 g/dL (ref 13.0–17.0)
MCH: 31.7 pg (ref 26.0–34.0)
MCH: 30.9 pg (ref 26.0–34.0)
MCHC: 34.1 g/dL (ref 30.0–36.0)
MCHC: 33.8 g/dL (ref 30.0–36.0)
MCV: 92.9 fL (ref 80.0–100.0)
MCV: 91.5 fL (ref 80.0–100.0)
Platelets: 91 10*3/uL — ABNORMAL LOW (ref 150–400)
Platelets: 84 10*3/uL — ABNORMAL LOW (ref 150–400)
RBC: 4.95 MIL/uL (ref 4.22–5.81)
RBC: 4.92 MIL/uL (ref 4.22–5.81)
RDW: 13 % (ref 11.5–15.5)
RDW: 13.2 % (ref 11.5–15.5)
WBC: 6.9 10*3/uL (ref 4.0–10.5)
WBC: 6.6 10*3/uL (ref 4.0–10.5)
nRBC: 0 % (ref 0.0–0.2)
nRBC: 0 % (ref 0.0–0.2)

## 2022-03-16 NOTE — ED Triage Notes (Signed)
Pt BIB EMS due to having a fall while in the shower. Pt has dementia and he is a&ox1. Pt received 450cc fo LR. Per EMS, pt could possibly have COVID due to wife having it at this time.  ?

## 2022-03-16 NOTE — ED Triage Notes (Signed)
Pt's wife states that the pt has been dizzy, vomiting, falling and running a fever and needs a covid test. Denies LOC. Denies any Head injury.  ?

## 2022-03-16 NOTE — ED Triage Notes (Signed)
FIRST NURSE NOTE:  Pt arrived via [facility] from home with wife s/p 3rd fall with reports of syncopal episode  earlier was seen here earlier, pt has some cognitive issues, VSS,  ? ?P83, 115/59 CBG 168 unable to get sats with EMS. ?

## 2022-03-16 NOTE — ED Provider Notes (Signed)
? ?[facility] ?Provider Note ? ? ? Event Date/Time  ? First MD Initiated Contact with Patient 03/16/22 1027   ?  (approximate) ? ? ?History  ? ?Fall ? ? ?HPI ? ?ALMA MUEGGE is a 88 y.o. male with a history of dementia, falls, right bundle branch block, peripheral arterial disease who presents after a reported fall/possible syncopal episode.  Currently patient collapsed in the shower.  No reports of seizure activity.  The patient denies any and all complaints, he appears to feels well and is laughing on evaluation ?  ? ? ?Physical Exam  ? ?Triage Vital Signs: ?ED Triage Vitals  ?Enc Vitals Group  ?   BP 03/16/22 1030 (!) 111/57  ?   Pulse Rate 03/16/22 1030 86  ?   Resp 03/16/22 1030 (!) 23  ?   Temp 03/16/22 1051 98.1 ?F (36.7 ?C)  ?   Temp Source 03/16/22 1051 Oral  ?   SpO2 03/16/22 1030 100 %  ?   Weight 03/16/22 1026 79.4 kg (175 lb)  ?   Height 03/16/22 1026 1.829 m (6')  ?   Head Circumference --   ?   Peak Flow --   ?   Pain Score 03/16/22 1026 0  ?   Pain Loc --   ?   Pain Edu? --   ?   Excl. in [place]? --   ? ? ?Most recent vital signs: ?Vitals:  ? 03/16/22 1115 03/16/22 1200  ?BP: 103/67 (!) 114/59  ?Pulse: 78 78  ?Resp: (!) 22 20  ?Temp:    ?SpO2: 100% 97%  ? ? ? ?General: Awake, no distress.  ?CV:  Good peripheral perfusion.  ?Resp:  Normal effort.  ?Abd:  No distention.  ?Other:  No chest wall tenderness palpation, normal range of motion upper extremities and lower extremities.  No pain with axial load on both hips.  No vertebral tenderness, no evidence of head trauma or neck pain ? ? ?ED Results / Procedures / Treatments  ? ?Labs ?(all labs ordered are listed, but only abnormal results are displayed) ?Labs Reviewed  ?CBC - Abnormal; Notable for the following components:  ?    Result Value  ? Platelets 91 (*)   ? All other components within normal limits  ?BASIC METABOLIC PANEL - Abnormal; Notable for the following components:  ? Glucose, Bld 114 (*)   ? Creatinine, Ser 1.44 (*)    ? GFR, Estimated 47 (*)   ? All other components within normal limits  ?TROPONIN I (HIGH SENSITIVITY)  ? ? ? ?EKG ? ?ED ECG REPORT ?I, Lavonia Drafts, the attending physician, personally viewed and interpreted this ECG. ? ?Date: 03/16/2022 ? ?Rhythm: normal sinus rhythm ?QRS Axis: normal ?Intervals: Right bundle branch block ?ST/T Wave abnormalities: normal ?Narrative Interpretation: no evidence of acute ischemia ? ? ? ?RADIOLOGY ?Pelvis x-ray interpreted by me, no fracture ? ? ? ?PROCEDURES: ? ?Critical Care performed:  ? ?.1-3 Lead EKG Interpretation ?Performed by: Lavonia Drafts, MD ?Authorized by: Lavonia Drafts, MD  ? ?  Interpretation: normal   ?  ECG rate assessment: normal   ?  Rhythm: sinus rhythm   ?  Ectopy: PVCs   ?  Conduction: normal   ? ? ?MEDICATIONS ORDERED IN ED: ?Medications - No data to display ? ? ?IMPRESSION / MDM / ASSESSMENT AND PLAN / ED COURSE  ?I reviewed the triage vital signs and the nursing notes. ? ?Patient is here with an acute  fall/possible syncope ? ?Patient with a history of dementia and falls presents after a fall.  Unclear whether there was a syncopal episode or not but son reports that he has had that in the past. ? ?Differential includes syncope, mechanical fall, fractures, electrolyte abnormalities, less likely ACS ? ?EKG is unchanged from prior.  Lab work reviewed and is overall reassuring, normal high sensitive troponin. ? ?Pelvis and lumbar spine x-rays are negative for fracture. ? ?Discussed with the patient's son who is power of attorney in the emergency department.  Considered admission however given the patient's history of similar events appropriate for discharge at this time ? ? ? ? ? ?  ? ? ?FINAL CLINICAL IMPRESSION(S) / ED DIAGNOSES  ? ?Final diagnoses:  ?Fall in home, initial encounter  ?Syncope, unspecified syncope type  ? ? ? ?Rx / DC Orders  ? ?ED Discharge Orders   ? ? None  ? ?  ? ? ? ?Note:  This document was prepared using Dragon voice recognition software  and may include unintentional dictation errors. ?  ?Lavonia Drafts, MD ?03/16/22 1229 ? ?

## 2022-03-17 ENCOUNTER — Emergency Department: Payer: Medicare Other

## 2022-03-17 ENCOUNTER — Encounter: Payer: Self-pay | Admitting: Family Medicine

## 2022-03-17 DIAGNOSIS — S0990XA Unspecified injury of head, initial encounter: Secondary | ICD-10-CM | POA: Diagnosis not present

## 2022-03-17 DIAGNOSIS — R42 Dizziness and giddiness: Secondary | ICD-10-CM | POA: Diagnosis not present

## 2022-03-17 DIAGNOSIS — R55 Syncope and collapse: Secondary | ICD-10-CM | POA: Insufficient documentation

## 2022-03-17 DIAGNOSIS — G319 Degenerative disease of nervous system, unspecified: Secondary | ICD-10-CM | POA: Diagnosis not present

## 2022-03-17 DIAGNOSIS — N400 Enlarged prostate without lower urinary tract symptoms: Secondary | ICD-10-CM

## 2022-03-17 DIAGNOSIS — F0393 Unspecified dementia, unspecified severity, with mood disturbance: Secondary | ICD-10-CM | POA: Diagnosis not present

## 2022-03-17 DIAGNOSIS — I4892 Unspecified atrial flutter: Secondary | ICD-10-CM | POA: Diagnosis not present

## 2022-03-17 DIAGNOSIS — Z043 Encounter for examination and observation following other accident: Secondary | ICD-10-CM | POA: Diagnosis not present

## 2022-03-17 DIAGNOSIS — U071 COVID-19: Secondary | ICD-10-CM

## 2022-03-17 DIAGNOSIS — R531 Weakness: Secondary | ICD-10-CM | POA: Diagnosis not present

## 2022-03-17 HISTORY — DX: COVID-19: U07.1

## 2022-03-17 LAB — RESP PANEL BY RT-PCR (FLU A&B, COVID) ARPGX2
Influenza A by PCR: NEGATIVE
Influenza B by PCR: NEGATIVE
SARS Coronavirus 2 by RT PCR: POSITIVE — AB

## 2022-03-17 LAB — URINALYSIS, COMPLETE (UACMP) WITH MICROSCOPIC
Bilirubin Urine: NEGATIVE
Glucose, UA: NEGATIVE mg/dL
Ketones, ur: NEGATIVE mg/dL
Leukocytes,Ua: NEGATIVE
Nitrite: NEGATIVE
Protein, ur: 30 mg/dL — AB
Specific Gravity, Urine: 1.025 (ref 1.005–1.030)
Squamous Epithelial / HPF: NONE SEEN (ref 0–5)
pH: 5 (ref 5.0–8.0)

## 2022-03-17 LAB — PROCALCITONIN: Procalcitonin: <0.1 ng/mL

## 2022-03-17 LAB — GLUCOSE, CAPILLARY: Glucose-Capillary: 151 mg/dL — ABNORMAL HIGH (ref 70–99)

## 2022-03-17 LAB — BRAIN NATRIURETIC PEPTIDE: B Natriuretic Peptide: 296.8 pg/mL — ABNORMAL HIGH (ref 0.0–100.0)

## 2022-03-17 LAB — D-DIMER, QUANTITATIVE: D-Dimer, Quant: <0.27 ug/mL-FEU (ref 0.00–0.50)

## 2022-03-17 LAB — C-REACTIVE PROTEIN: CRP: 1 mg/dL — ABNORMAL HIGH (ref <1.0)

## 2022-03-17 LAB — LACTATE DEHYDROGENASE: LDH: 124 U/L (ref 98–192)

## 2022-03-17 LAB — TROPONIN I (HIGH SENSITIVITY)
Troponin I (High Sensitivity): 11 ng/L (ref <18)
Troponin I (High Sensitivity): 11 ng/L (ref <18)
Troponin I (High Sensitivity): 10 ng/L (ref <18)

## 2022-03-17 LAB — FERRITIN: Ferritin: 184 ng/mL (ref 24–336)

## 2022-03-17 MED ORDER — NIRMATRELVIR/RITONAVIR (PAXLOVID)TABLET: 3.0000 | ORAL_TABLET | Freq: Two times a day (BID) | ORAL | Status: DC

## 2022-03-17 MED ORDER — HYDROCOD POLI-CHLORPHE POLI ER 10-8 MG/5ML PO SUER: 5.0000 mL | Freq: Two times a day (BID) | ORAL | Status: DC | PRN

## 2022-03-17 MED ORDER — LACTATED RINGERS IV BOLUS
500.0000 mL | Freq: Once | INTRAVENOUS | Status: AC
  Administered 2022-03-17: 500 mL via INTRAVENOUS

## 2022-03-17 MED ORDER — ONDANSETRON HCL 4 MG PO TABS: 4.0000 mg | ORAL_TABLET | Freq: Four times a day (QID) | ORAL | Status: DC | PRN

## 2022-03-17 MED ORDER — BOOST HIGH PROTEIN PO LIQD
1.0000 | Freq: Every day | ORAL | Status: DC | PRN
  Filled 2022-03-17: qty 237

## 2022-03-17 MED ORDER — ZINC SULFATE 220 (50 ZN) MG PO CAPS
220.0000 mg | ORAL_CAPSULE | Freq: Every day | ORAL | Status: DC
  Administered 2022-03-17 – 2022-03-18 (×2): 220 mg via ORAL
  Filled 2022-03-17 (×2): qty 1

## 2022-03-17 MED ORDER — RISAQUAD PO CAPS
1.0000 | ORAL_CAPSULE | Freq: Every day | ORAL | Status: DC
  Administered 2022-03-17 – 2022-03-18 (×2): 1 via ORAL
  Filled 2022-03-17 (×2): qty 1

## 2022-03-17 MED ORDER — VITAMIN B-12 1000 MCG PO TABS
500.0000 ug | ORAL_TABLET | Freq: Every day | ORAL | Status: DC
  Administered 2022-03-17 – 2022-03-18 (×2): 500 ug via ORAL
  Filled 2022-03-17 (×3): qty 1

## 2022-03-17 MED ORDER — POLYETHYLENE GLYCOL 3350 17 G PO PACK
17.0000 g | PACK | Freq: Every day | ORAL | Status: DC
  Administered 2022-03-17 – 2022-03-18 (×2): 17 g via ORAL
  Filled 2022-03-17 (×2): qty 1

## 2022-03-17 MED ORDER — MOLNUPIRAVIR EUA 200MG CAPSULE
4.0000 | ORAL_CAPSULE | Freq: Two times a day (BID) | ORAL | Status: DC
  Administered 2022-03-17 – 2022-03-18 (×3): 800 mg via ORAL
  Filled 2022-03-17: qty 4

## 2022-03-17 MED ORDER — ENOXAPARIN SODIUM 40 MG/0.4ML IJ SOSY: 40.0000 mg | PREFILLED_SYRINGE | INTRAMUSCULAR | Status: DC

## 2022-03-17 MED ORDER — FINASTERIDE 5 MG PO TABS
5.0000 mg | ORAL_TABLET | Freq: Every day | ORAL | Status: DC
  Administered 2022-03-17 – 2022-03-18 (×2): 5 mg via ORAL
  Filled 2022-03-17 (×3): qty 1

## 2022-03-17 MED ORDER — ONDANSETRON HCL 4 MG/2ML IJ SOLN: 4.0000 mg | Freq: Four times a day (QID) | INTRAMUSCULAR | Status: DC | PRN

## 2022-03-17 MED ORDER — ASCORBIC ACID 500 MG PO TABS
500.0000 mg | ORAL_TABLET | Freq: Every day | ORAL | Status: DC
  Administered 2022-03-17 – 2022-03-18 (×2): 500 mg via ORAL
  Filled 2022-03-17 (×2): qty 1

## 2022-03-17 MED ORDER — FLUOXETINE HCL 20 MG PO CAPS
20.0000 mg | ORAL_CAPSULE | Freq: Every day | ORAL | Status: DC
  Administered 2022-03-17 – 2022-03-18 (×2): 20 mg via ORAL
  Filled 2022-03-17 (×2): qty 1

## 2022-03-17 MED ORDER — MAGNESIUM HYDROXIDE 400 MG/5ML PO SUSP: 30.0000 mL | Freq: Every day | ORAL | Status: DC | PRN

## 2022-03-17 MED ORDER — ACETAMINOPHEN 325 MG PO TABS
650.0000 mg | ORAL_TABLET | Freq: Four times a day (QID) | ORAL | Status: DC | PRN
  Administered 2022-03-17: 650 mg via ORAL
  Filled 2022-03-17: qty 2

## 2022-03-17 MED ORDER — MIDODRINE HCL 5 MG PO TABS
5.0000 mg | ORAL_TABLET | Freq: Three times a day (TID) | ORAL | Status: DC
  Administered 2022-03-17 – 2022-03-18 (×4): 5 mg via ORAL
  Filled 2022-03-17 (×4): qty 1

## 2022-03-17 MED ORDER — VITAMIN D3 25 MCG (1000 UNIT) PO TABS
1000.0000 [IU] | ORAL_TABLET | Freq: Every day | ORAL | Status: DC
  Administered 2022-03-17 – 2022-03-18 (×2): 1000 [IU] via ORAL
  Filled 2022-03-17 (×4): qty 1

## 2022-03-17 MED ORDER — MEMANTINE HCL ER 14 MG PO CP24
14.0000 mg | ORAL_CAPSULE | ORAL | Status: DC
  Administered 2022-03-17: 14 mg via ORAL
  Filled 2022-03-17 (×2): qty 1

## 2022-03-17 MED ORDER — APIXABAN 5 MG PO TABS
5.0000 mg | ORAL_TABLET | Freq: Two times a day (BID) | ORAL | Status: DC
  Administered 2022-03-17 – 2022-03-18 (×3): 5 mg via ORAL
  Filled 2022-03-17 (×4): qty 1

## 2022-03-17 MED ORDER — GUAIFENESIN-DM 100-10 MG/5ML PO SYRP: 10.0000 mL | ORAL_SOLUTION | ORAL | Status: DC | PRN

## 2022-03-17 MED ORDER — SENNA 8.6 MG PO TABS: 1.0000 | ORAL_TABLET | Freq: Every evening | ORAL | Status: DC | PRN

## 2022-03-17 MED ORDER — POLYETHYLENE GLYCOL 3350 17 GM/SCOOP PO POWD: 0.5000 | Freq: Every day | ORAL | Status: DC

## 2022-03-17 NOTE — Assessment & Plan Note (Deleted)
-   We will continue Namenda XR and Prozac.

## 2022-03-17 NOTE — Assessment & Plan Note (Addendum)
-   This is associated with generalized weakness and earlier syncope yesterday. - The patient will be admitted to an observation medical telemetry bed. - She will be hydrated with IV normal saline. - We will check orthostatics. - We will place him on p.o. molnupiravir. - We will check inflammatory markers and BNP.Marland Kitchen - Vitamin C, vitamin D3 and zinc sulfate will be provided. - He will be continued on his Eliquis.

## 2022-03-17 NOTE — Progress Notes (Signed)
Occupational Therapy * Physical Therapy * Speech Therapy ?       ? ? ?DATE: 03/17/2022 ?PATIENT NAME: Dylan [name] ?PATIENT MRN: [id number] ? ?DIAGNOSIS/DIAGNOSIS CODE: Lenise Arena ?DATE OF DISCHARGE: 03/18/2022 ? ?PRIMARY CARE PHYSICIAN: [name], MD ?PCP PHONE/FAX: [phone] ? ?  ? ?Dear Provider (Name: [facility]  ?Fax: [phone] ?  ?I certify that I have examined this patient and that occupational/physical/speech therapy is necessary on an outpatient basis.   ? ?The patient has expressed interest in completing their recommended course of therapy at your location.  Once a formal order from the patient's primary care physician has been obtained, please contact him/her to schedule an appointment for evaluation at your earliest convenience. ? ? ?[ X ]  Physical Therapy Evaluate and Treat ? ?        [  ]  Occupational Therapy Evaluate and Treat ? ?                                  [  ]  Speech Therapy Evaluate and Treat ? ? ? ? ? ? ?The patient's primary care physician (listed above) must furnish and be responsible for a formal order such that the recommended services may be furnished while under the primary physician's care, and that the plan of care will be established and reviewed every 30 days (or more often if condition necessitates).  ?

## 2022-03-17 NOTE — ED Provider Notes (Signed)
? ?[facility] ?Provider Note ? ? ? Event Date/Time  ? First MD Initiated Contact with Patient 03/16/22 2326   ?  (approximate) ? ? ?History  ? ?Dizziness, Weakness, Fall, and Near Syncope ? ? ?HPI ? ?Dylan [name] is a 88 y.o. male with a history of dementia, chronic kidney disease, hypertension, hyperlipidemia, peripheral artery disease, thrombocytopenia who presents for evaluation of generalized weakness.  According to the wife patient has had several episodes of dizziness, and 1 episode of syncope over the last 2 days.  He has had a mild cough.  Has had generalized weakness.  He has complaint of intermittent episodes of feeling off balance and those are associated with nausea and vomiting.  He was seen earlier this morning for a syncopal event.  The wife reports that he was walking when he started feeling like he was going to pass out.  She held onto him and he passed out onto her shoulder.  He was seen in the ED and discharged back home.  Since going back home he has had 3 episodes of feeling off balance associated with flushing in the face, nausea and vomiting.  He has no chest pain or shortness of breath.  He has had no fevers at home. ?  ? ? ?Past Medical History:  ?Diagnosis Date  ? Basal cell carcinoma 08/31/2020  ? Mid forehead - ED&C   ? Basal cell carcinoma of right nasal sidewall 12/15/2014  ? Bilateral hearing loss 08/03/2018  ? S/p audiology evaluation, has new hearing aides.  ? Cataract extraction status 03/06/2017  ? Cellulitis   ? due to nail impaction thru boot L  ? 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 CKD (chronic kidney disease) stage 3, GFR 30-59 ml/min (HCC) 01/13/2019  ? Clostridium difficile colitis 05/2021  ? Dementia ([place])   ? Dysphagia 02/19/2020  ? ERECTILE DYSFUNCTION, MILD 02/03/2007  ? Qualifier: Diagnosis of  By: Council Mechanic MD, Hilaria Ota   ? Eversion of right lacrimal punctum 05/17/2015  ? Exposure keratopathy, 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  ? HYDROCELE, LEFT 02/03/2007  ?  Hyperlipidemia 12/1999  ? 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/2014  ? Mohs defect of eyelid 12/14/2014  ? Nuclear sclerosis, left 04/01/2017  ? Obstruction of right lacrimal duct 10/16/2015  ? PAD (peripheral artery disease) ([place]) 03/21/2020  ? Abnormal ABI, abnormal arterial duplex 03/2020:  R - moderate RLE disease, abnormal TBI L - WNL, abnormal TBI Referred for vascular consult - rec medical management  ? Pruritus 02/19/2020  ? Recurrent syncope 06/22/2018  ? 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  ? Right leg swelling 02/19/2020  ? Sepsis ([place]) 05/30/2020  ? Thrombocytopenia ([place]) 02/21/2011  ? periph smear stable 02/2020  ? Vitamin B12 deficiency 08/03/2018  ? Completed b12 shots 09/2018, maintaining levels on oral replacement (dissolvable tablets) 01/2019  ? Vitamin D deficiency 05/19/2018  ? ? ?Past Surgical History:  ?Procedure Laterality Date  ? BASAL CELL CARCINOMA EXCISION Right 01/2015  ? with reconstructive surgery around eye  ? CATARACT EXTRACTION Left 03/2017  ? ETT  09/2018  ? negative for ischemia, extremely poor exercise tolerance ([place])  ? FOOT SURGERY Left 1998  ? metatarsal amputation after injury  ? KNEE ARTHROSCOPY  1993  ? Right  ? KNEE ARTHROSCOPY  01/2001  ? Left  ? LOOP RECORDER INSERTION N/A 09/30/2018  ? Procedure: LOOP RECORDER INSERTION;  Surgeon: Constance Haw, MD;  Location: [facility];  Service: Cardiovascular;  Laterality: N/A;  ? ? ? ?Physical Exam  ? ?Triage Vital Signs: ?ED Triage Vitals  ?Enc Vitals Group  ?   BP 03/16/22 2241 106/60  ?   Pulse Rate 03/16/22 2241 76  ?   Resp 03/16/22 2241 17  ?   Temp 03/16/22 2241 99.2 ?F (37.3 ?C)  ?   Temp Source 03/16/22 2241 Oral  ?   SpO2 03/16/22 2241 97 %  ?   Weight 03/16/22 2331 175 lb (79.4 kg)  ?   Height 03/16/22 2331 6' (1.829 m)  ?   Head Circumference --   ?   Peak Flow --   ?   Pain Score 03/16/22 2330 0  ?   Pain Loc --   ?   Pain Edu? --   ?    Excl. in [place]? --   ? ? ?Most recent vital signs: ?Vitals:  ? 03/17/22 0300 03/17/22 0330  ?BP: (!) 111/53 128/62  ?Pulse: 70   ?Resp: (!) 22 20  ?Temp:    ?SpO2: 99% 97%  ? ? ? ?Constitutional: Alert and oriented. Well appearing and in no apparent distress. ?HEENT: ?     Head: Normocephalic and atraumatic.    ?     Eyes: Conjunctivae are normal. Sclera is non-icteric.  ?     Mouth/Throat: Mucous membranes are moist.  ?     Neck: Supple with no signs of meningismus. ?Cardiovascular: Regular rate and rhythm. No murmurs, gallops, or rubs. 2+ symmetrical distal pulses are present in all extremities.  ?Respiratory: Normal respiratory effort. Lungs are clear to auscultation bilaterally.  ?Gastrointestinal: Soft, non tender, and non distended with positive bowel sounds. No rebound or guarding. ?Genitourinary: No CVA tenderness. ?Musculoskeletal:  No edema, cyanosis, or erythema of extremities. ?Neurologic: Normal speech and language. Face is symmetric. Moving all extremities. No gross focal neurologic deficits are appreciated. ?Skin: Skin is warm, dry and intact. No rash noted. ?Psychiatric: Mood and affect are normal. Speech and behavior are normal. ? ?ED Results / Procedures / Treatments  ? ?Labs ?(all labs ordered are listed, but only abnormal results are displayed) ?Labs Reviewed  ?RESP PANEL BY RT-PCR (FLU A&B, COVID) ARPGX2 - Abnormal; Notable for the following components:  ?    Result Value  ? SARS Coronavirus 2 by RT PCR POSITIVE (*)   ? All other components within normal limits  ?BASIC METABOLIC PANEL - Abnormal; Notable for the following components:  ? Glucose, Bld 134 (*)   ? Creatinine, Ser 1.46 (*)   ? GFR, Estimated 47 (*)   ? All other components within normal limits  ?CBC - Abnormal; Notable for the following components:  ? Platelets 84 (*)   ? All other components within normal limits  ?URINALYSIS, COMPLETE (UACMP) WITH MICROSCOPIC - Abnormal; Notable for the following components:  ? Color, Urine YELLOW (*)    ? APPearance CLEAR (*)   ? Hgb urine dipstick SMALL (*)   ? Protein, ur 30 (*)   ? Bacteria, UA RARE (*)   ? All other components within normal limits  ?TROPONIN I (HIGH SENSITIVITY)  ? ? ? ?EKG ? ?ED ECG REPORT ?I, Rudene Re, the attending physician, personally viewed and interpreted this ECG. ? ?Sinus rhythm with several PVCs, right bundle branch block, no ST elevations or depressions.  Unchanged when compared to prior ? ?RADIOLOGY ?I, Rudene Re, attending MD, have personally viewed and interpreted the images obtained during this visit as below: ? ?  Chest x-ray negative for pneumonia ? ?CT head negative for intracranial pathology ? ? ?___________________________________________________ ?Interpretation by Radiologist:  ?DG Lumbar Spine 2-3 Views ? ?Result Date: 03/16/2022 ?CLINICAL DATA:  88 year old male status post fall in the shower. Recent COVID-19 contact. EXAM: LUMBAR SPINE - 2-3 VIEW COMPARISON:  CT Abdomen and Pelvis 06/23/2021. FINDINGS: Normal lumbar segmentation. Stable lumbar vertebral height and alignment since last year. Advanced chronic disc and endplate degeneration N1-B1 and L5-S1. No acute osseous abnormality identified. Negative visible bowel gas pattern. IMPRESSION: 1. No acute osseous abnormality identified in the lumbar spine. 2. Advanced chronic disc and endplate degeneration at L2-L3 and L5-S1. Electronically Signed   By: Genevie Ann M.D.   On: 03/16/2022 11:38  ? ?DG Pelvis 1-2 Views ? ?Result Date: 03/16/2022 ?CLINICAL DATA:  88 year old male status post fall in the shower. Recent COVID-19 contact. EXAM: PELVIS - 1-2 VIEW COMPARISON:  CT Abdomen and Pelvis 06/23/2021. FINDINGS: Bilateral femoral heads remain normally located. Bone mineralization is within normal limits for age. Pelvis appears intact. SI joints appear symmetric. Grossly intact proximal femurs. Negative visible bowel gas. Chronic pelvic phleboliths mostly on the left. IMPRESSION: No acute fracture or dislocation  identified about the pelvis. If there is lateralizing hip pain dedicated hip series is recommended. Electronically Signed   By: Genevie Ann M.D.   On: 03/16/2022 11:36  ? ?CT Head Wo Contrast ? ?Result Date:

## 2022-03-17 NOTE — TOC Transition Note (Addendum)
Transition of Care (TOC) - CM/SW Discharge Note ? ? ?Patient Details  ?Name: ABDURAHMAN RUGG ?MRN: [id number] ?Date of Birth: August 19, 1934 ? ?Transition of Care (TOC) CM/SW Contact:  ?Raina Mina, LCSWA ?Phone Number: ?03/17/2022, 3:03 PM ? ? ?Clinical Narrative:   [facility], [facility], [facility], and [facility] unable to take patient for Home Health PT. Messages were left for [facility] and [facility] but no answer has been received at this time. Family choose outpatient PT at [facility]. Outpatient PT referral faxed to [facility]  ? ? ? ?  ?  ? ? ?Patient Goals and CMS Choice ?  ?  ?  ? ?Discharge Placement ?  ?           ?  ?  ?  ?  ? ?Discharge Plan and Services ?  ?  ?           ?  ?  ?  ?  ?  ?  ?  ?  ?  ?  ? ?Social Determinants of Health (SDOH) Interventions ?  ? ? ?Readmission Risk Interventions ?   ? View : No data to display.  ?  ?  ?  ? ? ? ? ? ?

## 2022-03-17 NOTE — Progress Notes (Signed)
[place] at [facility] ? ? ?PATIENT NAME: Dylan [name]   ? ?MR#:  [id number] ? ?DATE OF BIRTH:  12/12/1933 ? ?SUBJECTIVE:  ?patient son Dylan [name] at bedside. Came in after having dizziness and weakness and all blackouts bowel yesterday. Patient also had episode of vomiting. Wife was diagnosed with COVID. Paste patient tested positive for COVID here. Has been eating reasonably well. Worked with physical therapy walked around with walker near the nurses station. No fever today. ? ? ? ?VITALS:  ?Blood pressure (!) 107/57, pulse 60, temperature 97.8 ?F (36.6 ?C), resp. rate 18, height 6' (1.829 m), weight 79.4 kg, SpO2 98 %. ? ?PHYSICAL EXAMINATION:  ? ?GENERAL:  88 y.o.-year-old patient lying in the bed with no acute distress.  ?LUNGS: Normal breath sounds bilaterally, no wheezing, rales, rhonchi.  ?CARDIOVASCULAR: S1, S2 normal. No murmurs, rubs, or gallops.  ?ABDOMEN: Soft, nontender, nondistended. Bowel sounds present.  ?EXTREMITIES: No  edema b/l.    ?NEUROLOGIC: nonfocal  patient is alert and awake ?SKIN: No obvious rash, lesion, or ulcer.  ? ?LABORATORY PANEL:  ?CBC ?Recent Labs  ?Lab 03/16/22 ?2250  ?WBC 6.6  ?HGB 15.2  ?HCT 45.0  ?PLT 84*  ? ? ?Chemistries  ?Recent Labs  ?Lab 03/16/22 ?2250  ?NA 139  ?K 4.4  ?CL 106  ?CO2 26  ?GLUCOSE 134*  ?BUN 22  ?CREATININE 1.46*  ?CALCIUM 9.2  ? ?Cardiac Enzymes ?No results for input(s): TROPONINI in the last 168 hours. ?RADIOLOGY:  ?DG Lumbar Spine 2-3 Views ? ?Result Date: 03/16/2022 ?CLINICAL DATA:  88 year old male status post fall in the shower. Recent COVID-19 contact. EXAM: LUMBAR SPINE - 2-3 VIEW COMPARISON:  CT Abdomen and Pelvis 06/23/2021. FINDINGS: Normal lumbar segmentation. Stable lumbar vertebral height and alignment since last year. Advanced chronic disc and endplate degeneration Y8-M5 and L5-S1. No acute osseous abnormality identified. Negative visible bowel gas pattern. IMPRESSION: 1. No acute osseous abnormality identified in the  lumbar spine. 2. Advanced chronic disc and endplate degeneration at L2-L3 and L5-S1. Electronically Signed   By: Genevie Ann M.D.   On: 03/16/2022 11:38  ? ?DG Pelvis 1-2 Views ? ?Result Date: 03/16/2022 ?CLINICAL DATA:  88 year old male status post fall in the shower. Recent COVID-19 contact. EXAM: PELVIS - 1-2 VIEW COMPARISON:  CT Abdomen and Pelvis 06/23/2021. FINDINGS: Bilateral femoral heads remain normally located. Bone mineralization is within normal limits for age. Pelvis appears intact. SI joints appear symmetric. Grossly intact proximal femurs. Negative visible bowel gas. Chronic pelvic phleboliths mostly on the left. IMPRESSION: No acute fracture or dislocation identified about the pelvis. If there is lateralizing hip pain dedicated hip series is recommended. Electronically Signed   By: Genevie Ann M.D.   On: 03/16/2022 11:36  ? ?CT Head Wo Contrast ? ?Result Date: 03/17/2022 ?CLINICAL DATA:  Status post fall. EXAM: CT HEAD WITHOUT CONTRAST TECHNIQUE: Contiguous axial images were obtained from the base of the skull through the vertex without intravenous contrast. RADIATION DOSE REDUCTION: This exam was performed according to the departmental dose-optimization program which includes automated exposure control, adjustment of the mA and/or kV according to patient size and/or use of iterative reconstruction technique. COMPARISON:  June 23, 2021 FINDINGS: Brain: There is moderate severity cerebral atrophy with widening of the extra-axial spaces and ventricular dilatation. There are areas of decreased attenuation within the white matter tracts of the supratentorial brain, consistent with microvascular disease changes. Vascular: No hyperdense vessel or unexpected calcification. Skull: Normal. Negative for fracture or  focal lesion. Sinuses/Orbits: No acute finding. Other: None. IMPRESSION: 1. No acute intracranial abnormality. 2. Generalized cerebral atrophy and microvascular disease changes of the supratentorial brain.  Electronically Signed   By: Virgina Norfolk M.D.   On: 03/17/2022 00:39  ? ?DG Chest Portable 1 View ? ?Result Date: 03/17/2022 ?CLINICAL DATA:  Fever with dizziness, vomiting and falling. EXAM: PORTABLE CHEST 1 VIEW COMPARISON:  May 04, 2021 FINDINGS: The heart size and mediastinal contours are within normal limits. A radiopaque loop recorder device is seen. Low lung volumes are noted. Both lungs are clear. The visualized skeletal structures are unremarkable. IMPRESSION: No active cardiopulmonary disease. Electronically Signed   By: Virgina Norfolk M.D.   On: 03/17/2022 00:47   ? ?Assessment and Plan ?Dylan [name] is a 88 y.o. Caucasian male with medical history significant for dementia, dyslipidemia, PAD, CKD 3, hearing loss, depression, BPH and atrial flutter on Eliquis, who presented to the emergency room with acute onset of generalized weakness with associated nausea and vomiting as well as fever and feeling flushed with no diarrhea. ? ? Portable chest ray showed no acute cardiopulmonary disease. ? ?COVID infection ?-- came in with generalized weakness dizziness vomiting and syncopal episode. ?-- Remains in sinus rhythm ?-- received IV fluid ?- tolerating PO ?-- continue PO Monlupiravir x 5 days ?patient ambulated with physical therapy. Recommends home health PT ? ?dementia with depression ?-- continue Namenda and Prozac ? ?BPH ?-- continue Proscar ? ?paroxysmal atrial flutter ?-- continue eliquis ? ?Family communication : son Dylan [name] at bedside ?Consults : none ?CODE STATUS: DNR ?DVT Prophylaxis : eliquis ?Level of care: Telemetry Medical ?Status is: Observation ?The patient remains OBS appropriate and will d/c before 2 midnights. ?  ?Overall remains stable. Continues to show improvement will discharged to home tomorrow. Discussed with patient's son he is in agreement with plan. ? ?TOTAL TIME TAKING CARE OF THIS PATIENT: 25 minutes.  ?>50% time spent on counselling and coordination of care ? ?Note: This  dictation was prepared with Dragon dictation along with smaller phrase technology. Any transcriptional errors that result from this process are unintentional. ? ?Fritzi Mandes M.D  ? ? ?[facility]  ? ?CC: ?Primary care physician; [name], MD  ?

## 2022-03-17 NOTE — Assessment & Plan Note (Signed)
-  We will continue withEliquis. ?

## 2022-03-17 NOTE — Assessment & Plan Note (Signed)
-   We will continue his Proscar. 

## 2022-03-17 NOTE — Evaluation (Signed)
Physical Therapy Evaluation ?Patient Details ?Name: Dylan [name] ?MRN: [id number] ?DOB: Mar 11, 1934 ?Today's Date: 03/17/2022 ? ?History of Present Illness ? Patient is an 88 year old male with a PMH (+) for dementia, dyslipidemia, PAD, CKD 3, hearing loss, depression, BP, hx of falls, and atrial flutter on Eliquis, who presented to the ED following a fall in the shower. ?  ?Clinical Impression ? Physical Therapy Evaluation completed this date. Patient tolerated session well and was agreeable to treatment. Upon entering room patient was resting in bed with son at bedside. No pain reported throughout session. Per patient and patient's son, he lives in a 1 story with his wife. There are 3 STE with bilateral hand rails. Prior to hospitalization patient did not ambulation with an AD and was Mod I with all aspects of daily living. Per son, patient goes to [place] 3x/week for their adult daycare. Son reports multiple falls over the last 6 months but unsure of how many. Patient follows one step commands consistently, however is mostly non-verbal. Will provide one word answers inconsistently.  ? ?Patient demonstrates good grip strength bilaterally, however at least 3/5 strength in BLEs. With cueing, patient is able to complete all bed mobility at [facility]. Functional transfers were completed at [facility] with cueing on hand placement. Patient ambulated 1 lap around the nurses station with a RW at [place]. Patient required directional cueing, and demonstrated poor obstacle negotiation. No LOB noted however. Patient was left in bed with family and MD present. Patient would continue to benefit from skilled physical therapy in order to optimize safety, strength, balance, and independence with ADLs. Recommend HHPT upon discharge from acute hospitalization.  ?   ? ?Recommendations for follow up therapy are one component of a multi-disciplinary discharge planning process, led by the attending physician.  Recommendations may be updated based on  patient status, additional functional criteria and insurance authorization. ? ?Follow Up Recommendations Home health PT ? ?  ?Assistance Recommended at Discharge Frequent or constant Supervision/Assistance  ?Patient can return home with the following ? A little help with walking and/or transfers;Help with stairs or ramp for entrance;A little help with bathing/dressing/bathroom ? ?  ?Equipment Recommendations Rolling walker (2 wheels)  ?Recommendations for Other Services ?    ?  ?Functional Status Assessment Patient has had a recent decline in their functional status and demonstrates the ability to make significant improvements in function in a reasonable and predictable amount of time.  ? ?  ?Precautions / Restrictions Precautions ?Precautions: Fall ?Restrictions ?Weight Bearing Restrictions: No  ? ?  ? ?Mobility ? Bed Mobility ?Overal bed mobility: Needs Assistance ?Bed Mobility: Supine to Sit, Sit to Supine ?  ?  ?Supine to sit: Supervision ?Sit to supine: Supervision ?  ?General bed mobility comments: cueing on hand placement, increased time and effort ?  ? ?Transfers ?Overall transfer level: Needs assistance ?Equipment used: Rolling walker (2 wheels) ?Transfers: Sit to/from Stand ?Sit to Stand: Supervision ?  ?  ?  ?  ?  ?  ?  ? ?Ambulation/Gait ?Ambulation/Gait assistance: Min guard ?Gait Distance (Feet): 160 Feet ?Assistive device: Rolling walker (2 wheels) ?Gait Pattern/deviations: Shuffle ?Gait velocity: decreased ?  ?  ?General Gait Details: poor obstacle negotiation, cueing to stay within the BOS of the walker ? ?Stairs ?  ?  ?  ?  ?  ? ?Wheelchair Mobility ?  ? ?Modified Rankin (Stroke Patients Only) ?  ? ?  ? ?Balance Overall balance assessment: Needs assistance ?Sitting-balance support: Feet supported,  Bilateral upper extremity supported ?Sitting balance-Leahy Scale: Good ?  ?  ?Standing balance support: Bilateral upper extremity supported, During functional activity, Reliant on assistive device for  balance ?Standing balance-Leahy Scale: Fair ?Standing balance comment: CGA during functional activity ?  ?  ?  ?  ?  ?  ?  ?  ?  ?  ?  ?   ? ? ? ?Pertinent Vitals/Pain Pain Assessment ?Pain Assessment: No/denies pain  ? ? ?Home Living Family/patient expects to be discharged to:: Private residence ?Living Arrangements: Spouse/significant other ?Available Help at Discharge: Family;Available 24 hours/day ?Type of Home: House ?Home Access: Stairs to enter ?Entrance Stairs-Rails: Can reach both;Left;Right ?Entrance Stairs-Number of Steps: 3 ?  ?Home Layout: One level ?Home Equipment: Kasandra Knudsen - single point ?   ?  ?Prior Function Prior Level of Function : Independent/Modified Independent ?  ?  ?  ?  ?  ?  ?  ?  ?  ? ? ?Hand Dominance  ? Dominant Hand: Right ? ?  ?Extremity/Trunk Assessment  ? Upper Extremity Assessment ?Upper Extremity Assessment: Generalized weakness (good grip strength bilaterally,at least 3+to4-/5 strength bilaterally) ?  ? ?Lower Extremity Assessment ?Lower Extremity Assessment:  (at least 3/5 strength bilaterally) ?  ? ?   ?Communication  ? Communication: HOH  ?Cognition Arousal/Alertness: Awake/alert ?Behavior During Therapy: [facility] for tasks assessed/performed ?Overall Cognitive Status: History of cognitive impairments - at baseline ?  ?  ?  ?  ?  ?  ?  ?  ?  ?  ?  ?  ?  ?  ?  ?  ?General Comments: Alert to name and location (with cueing) ?  ?  ? ?  ?General Comments   ? ?  ?Exercises Other Exercises ?Other Exercises: patiend educated on role of PT in acute care setting, fall risk, d/c recommendations  ? ?Assessment/Plan  ?  ?PT Assessment Patient needs continued PT services  ?PT Problem List Decreased strength;Decreased mobility;Decreased cognition;Decreased balance;Decreased activity tolerance;Decreased safety awareness ? ?   ?  ?PT Treatment Interventions DME instruction;Therapeutic activities;Therapeutic exercise;Gait training;Stair training;Balance training;Cognitive remediation   ? ?PT Goals  (Current goals can be found in the Care Plan section)  ?Acute Rehab PT Goals ?PT Goal Formulation: Patient unable to participate in goal setting ?Time For Goal Achievement: 03/31/22 ?Potential to Achieve Goals: Good ? ?  ?Frequency Min 2X/week ?  ? ? ?Co-evaluation   ?  ?  ?  ?  ? ? ?  ?AM-PAC PT "6 Clicks" Mobility  ?Outcome Measure Help needed turning from your back to your side while in a flat bed without using bedrails?: None ?Help needed moving from lying on your back to sitting on the side of a flat bed without using bedrails?: None ?Help needed moving to and from a bed to a chair (including a wheelchair)?: A Little ?Help needed standing up from a chair using your arms (e.g., wheelchair or bedside chair)?: A Little ?Help needed to walk in hospital room?: A Little ?Help needed climbing 3-5 steps with a railing? : A Lot ?6 Click Score: 19 ? ?  ?End of Session Equipment Utilized During Treatment: Gait belt ?Activity Tolerance: Patient tolerated treatment well;Other (comment) (impaired cognition) ?Patient left: in bed;with family/visitor present (With MD present) ?Nurse Communication: Mobility status ?PT Visit Diagnosis: Unsteadiness on feet (R26.81);Muscle weakness (generalized) (M62.81);History of falling (Z91.81) ?  ? ?Time: 1236-1300 ?PT Time Calculation (min) (ACUTE ONLY): 24 min ? ? ?Charges:   PT Evaluation ?$PT Eval Low Complexity:  1 Low ?PT Treatments ?$Gait Training: 8-22 mins ?  ?   ? ? ?Iva Boop, PT  ?03/17/22. 1:20 PM ? ? ?

## 2022-03-17 NOTE — H&P (Addendum)
[place]   PATIENT NAME: Dylan [name]    MR#:  [id number]  DATE OF BIRTH:  May 24, 1934  DATE OF ADMISSION:  03/16/2022  PRIMARY CARE PHYSICIAN: [name], MD   Patient is coming from: Home  REQUESTING/REFERRING PHYSICIAN: Rudene Re, MD  CHIEF COMPLAINT:   Chief Complaint  Patient presents with   Dizziness   Weakness   Fall   Near Syncope    HISTORY OF PRESENT ILLNESS:  Dylan [name] is a 88 y.o. Caucasian male with medical history significant for dementia, dyslipidemia, PAD, CKD 3, hearing loss, depression, BPH and atrial flutter on Eliquis, who presented to the emergency room with acute onset of generalized weakness with associated nausea and vomiting as well as fever and feeling flushed with no diarrhea.  Did not any loss of taste or smell.  He has been having cough that occasionally is productive of clear sputum.  He admits to rhinorrhea without significant nasal congestion.  He felt dehydrated.  He did not have any reported wheezing per his wife.  No chest pain or palpitations.  No dysuria, oliguria or hematuria or flank pain.  He was seen on Saturday morning after having a syncopal episode and managed here in the ER then discharged.  ED Course: When he came to the ER this time BP was 110/59 with respiratory rate of 21 and later 24 with otherwise normal vital signs.  Labs revealed a creatinine of 1.46 slightly higher than previous levels.  UA was unremarkable.  Influenza antigens came back negative.  COVID-19 PCR came back positive.  CBC showed thrombocytopenia.  EKG as reviewed by me : EKG showed sinus rhythm with a rate of 76 with frequent PVCs and right bundle branch block with left axis deviation Imaging: Portable chest ray showed no acute cardiopulmonary disease.  The patient was given 500 mill IV lactated ringer bolus.  He will be admitted to an observation medical telemetry bed for further evaluation and management. PAST MEDICAL HISTORY:   Past  Medical History:  Diagnosis Date   Basal cell carcinoma 08/31/2020   Mid forehead - ED&C    Basal cell carcinoma of right nasal sidewall 12/15/2014   Bilateral hearing loss 08/03/2018   S/p audiology evaluation, has new hearing aides.   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/2014   Mohs defect of eyelid 12/14/2014   Nuclear sclerosis, left 04/01/2017   Obstruction of right lacrimal duct 10/16/2015   PAD (peripheral artery disease) ([place]) 03/21/2020   Abnormal ABI, abnormal arterial duplex 03/2020:  R - moderate RLE disease, abnormal TBI L - WNL, abnormal TBI Referred for vascular consult - rec medical management   Pruritus 02/19/2020   Recurrent syncope 06/22/2018   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   Right leg swelling 02/19/2020   Sepsis ([place]) 05/30/2020   Thrombocytopenia ([place]) 02/21/2011   periph smear stable 02/2020   Vitamin  B12 deficiency 08/03/2018   Completed b12 shots 09/2018, maintaining levels on oral replacement (dissolvable tablets) 01/2019   Vitamin D deficiency 05/19/2018    PAST SURGICAL HISTORY:   Past Surgical History:  Procedure Laterality Date   BASAL CELL CARCINOMA EXCISION Right 01/2015   with reconstructive surgery around eye   CATARACT EXTRACTION Left 03/2017   ETT  09/2018   negative for ischemia, extremely poor exercise tolerance ([place])   FOOT SURGERY Left 1998    metatarsal amputation after injury   KNEE ARTHROSCOPY  1993   Right   KNEE ARTHROSCOPY  01/2001   Left   LOOP RECORDER INSERTION N/A 09/30/2018   Procedure: LOOP RECORDER INSERTION;  Surgeon: Constance Haw, MD;  Location: [facility];  Service: Cardiovascular;  Laterality: N/A;    SOCIAL HISTORY:   Social History   Tobacco Use   Smoking status: Former    Types: Cigarettes    Quit date: 11/11/1960    Years since quitting: 61.4   Smokeless tobacco: Never  Substance Use Topics   Alcohol use: No    FAMILY HISTORY:   Family History  Problem Relation Age of Onset   Congestive Heart Failure Mother    Stroke Father 30   Congestive Heart Failure Father    Cataracts Sister    Leukemia Brother     DRUG ALLERGIES:   Allergies  Allergen Reactions   Doxepin Other (See Comments)    Lethargy, overly sedating at low dose   Lincomycin Hcl Rash    REVIEW OF SYSTEMS:   ROS As per history of present illness. All pertinent systems were reviewed above. Constitutional, HEENT, cardiovascular, respiratory, GI, GU, musculoskeletal, neuro, psychiatric, endocrine, integumentary and hematologic systems were reviewed and are otherwise negative/unremarkable except for positive findings mentioned above in the HPI.   MEDICATIONS AT HOME:   Prior to Admission medications   Medication Sig Start Date End Date Taking? Authorizing Provider  apixaban (ELIQUIS) 5 MG TABS tablet Take 1 tablet (5 mg total) by mouth 2 (two) times daily. 10/08/21   Baldwin Jamaica, PA-C  Cholecalciferol (VITAMIN D3) 1000 units CAPS Take 1 capsule (1,000 Units total) by mouth daily. 08/03/18   [name], MD  feeding supplement (BOOST HIGH PROTEIN) LIQD Take 237 mLs by mouth daily as needed (about 3 times a week). 01/11/22   [name], MD  fexofenadine [facility] ALLERGY) 180 MG tablet Take 1 tablet (180 mg total) by mouth daily. 02/19/22   [name], MD  finasteride (PROSCAR) 5 MG tablet  Take 1 tablet (5 mg total) by mouth daily. 08/27/21   [name], MD  FLUoxetine (PROZAC) 20 MG capsule Take 1 capsule (20 mg total) by mouth daily. 02/19/22   [name], MD  memantine (NAMENDA XR) 14 MG CP24 24 hr capsule Take 1 capsule (14 mg total) by mouth every other day. 02/19/22   [name], MD  midodrine (PROAMATINE) 5 MG tablet Take 5 mg by mouth 3 (three) times daily. 09/17/21   [provider]  Polyethylene Glycol 3350 (MIRALAX PO) Take by mouth daily.    [provider]  Probiotic Product (ALIGN PO) Take by mouth daily.    [provider]  Sennosides (SENOKOT PO) Take by mouth daily.    [provider]  vitamin B-12 (V-R VITAMIN B-12) 500 MCG tablet Take 1 tablet (500 mcg total) by mouth daily. 08/11/19   [name], MD      VITAL SIGNS:  Blood pressure 107/63,  pulse 71, temperature 98.3 F (36.8 C), resp. rate 16, height 6' (1.829 m), weight 79.4 kg, SpO2 95 %.  PHYSICAL EXAMINATION:  Physical Exam  GENERAL:  88 y.o.-year-old Caucasian male patient lying in the bed with no acute distress.  EYES: Pupils equal, round, reactive to light and accommodation. No scleral icterus. Extraocular muscles intact.  HEENT: Head atraumatic, normocephalic. Oropharynx and nasopharynx clear.  NECK:  Supple, no jugular venous distention. No thyroid enlargement, no tenderness.  LUNGS: Normal breath sounds bilaterally, no wheezing, rales,rhonchi or crepitation. No use of accessory muscles of respiration.  CARDIOVASCULAR: Regular rate and rhythm, S1, S2 normal. No murmurs, rubs, or gallops.  ABDOMEN: Soft, nondistended, nontender. Bowel sounds present. No organomegaly or mass.  EXTREMITIES: No pedal edema, cyanosis, or clubbing.  NEUROLOGIC: Cranial nerves II through XII are intact. Muscle strength 5/5 in all extremities. Sensation intact. Gait not checked.  PSYCHIATRIC: The patient is alert and oriented x 3.  Normal affect and good eye  contact. SKIN: No obvious rash, lesion, or ulcer.   LABORATORY PANEL:   CBC No results for input(s): WBC, HGB, HCT, PLT in the last 168 hours.  ------------------------------------------------------------------------------------------------------------------  Chemistries  No results for input(s): NA, K, CL, CO2, GLUCOSE, BUN, CREATININE, CALCIUM, MG, AST, ALT, ALKPHOS, BILITOT in the last 168 hours.  Invalid input(s): GFRCGP  ------------------------------------------------------------------------------------------------------------------  Cardiac Enzymes No results for input(s): TROPONINI in the last 168 hours. ------------------------------------------------------------------------------------------------------------------  RADIOLOGY:  No results found.    IMPRESSION AND PLAN:  Assessment and Plan: * COVID-19 virus infection - This is associated with generalized weakness and earlier syncope yesterday. - The patient will be admitted to an observation medical telemetry bed. - She will be hydrated with IV normal saline. - We will check orthostatics. - We will place him on p.o. molnupiravir. - We will check inflammatory markers and BNP.Marland Kitchen - Vitamin C, vitamin D3 and zinc sulfate will be provided. - He will be continued on his Eliquis.  Paroxysmal atrial flutter (HCC) -We will continue withEliquis.  Dementia, senile with depression ([place]) - We will continue Namenda XR and Prozac.  BPH (benign prostatic hyperplasia) - We will continue his Proscar.   DVT prophylaxis: Eliquis.  Advanced Care Planning:  Code Status: The patient is DNR/DNI.  This was discussed with his wife.  Family Communication:  The plan of care was discussed in details with the patient (and his wife). I answered all questions. The patient agreed to proceed with the above mentioned plan. Further management will depend upon hospital course. Disposition Plan: Back to previous home environment Consults  called: none. All the records are reviewed and case discussed with ED provider.  Status is: Observation  I certify that at the time of admission, it is my clinical judgment that the patient will require  hospital care extending less than 2 midnights.                            Dispo: The patient is from: Home              Anticipated d/c is to: Home              Patient currently is not medically stable to d/c.              Difficult to place patient: No  Christel Mormon M.D on 03/28/2022 at 6:38 AM  [facility]   From 7 PM-7 AM, contact night-coverage [email or web address]  CC: Primary  care physician; [name], MD

## 2022-03-18 ENCOUNTER — Ambulatory Visit (INDEPENDENT_AMBULATORY_CARE_PROVIDER_SITE_OTHER): Payer: Medicare Other

## 2022-03-18 DIAGNOSIS — R55 Syncope and collapse: Secondary | ICD-10-CM | POA: Diagnosis not present

## 2022-03-18 DIAGNOSIS — U071 COVID-19: Secondary | ICD-10-CM | POA: Diagnosis not present

## 2022-03-18 LAB — MAGNESIUM: Magnesium: 1.8 mg/dL (ref 1.7–2.4)

## 2022-03-18 LAB — BASIC METABOLIC PANEL
Anion gap: 7 (ref 5–15)
BUN: 16 mg/dL (ref 8–23)
CO2: 25 mmol/L (ref 22–32)
Calcium: 8.4 mg/dL — ABNORMAL LOW (ref 8.9–10.3)
Chloride: 105 mmol/L (ref 98–111)
Creatinine, Ser: 1.34 mg/dL — ABNORMAL HIGH (ref 0.61–1.24)
GFR, Estimated: 52 mL/min — ABNORMAL LOW (ref >60)
Glucose, Bld: 95 mg/dL (ref 70–99)
Potassium: 3.9 mmol/L (ref 3.5–5.1)
Sodium: 137 mmol/L (ref 135–145)

## 2022-03-18 LAB — CBC
HCT: 41.9 % (ref 39.0–52.0)
Hemoglobin: 14.5 g/dL (ref 13.0–17.0)
MCH: 31.2 pg (ref 26.0–34.0)
MCHC: 34.6 g/dL (ref 30.0–36.0)
MCV: 90.1 fL (ref 80.0–100.0)
Platelets: 68 10*3/uL — ABNORMAL LOW (ref 150–400)
RBC: 4.65 MIL/uL (ref 4.22–5.81)
RDW: 13.1 % (ref 11.5–15.5)
WBC: 7.3 10*3/uL (ref 4.0–10.5)
nRBC: 0 % (ref 0.0–0.2)

## 2022-03-18 LAB — TSH: TSH: 0.753 u[IU]/mL (ref 0.350–4.500)

## 2022-03-18 MED ORDER — MOLNUPIRAVIR EUA 200MG CAPSULE
4.0000 | ORAL_CAPSULE | Freq: Two times a day (BID) | ORAL | 0 refills | Status: AC
Start: 2022-03-18 — End: 2022-03-23

## 2022-03-18 MED ORDER — GUAIFENESIN-DM 100-10 MG/5ML PO SYRP: 10.0000 mL | ORAL_SOLUTION | ORAL | 0 refills | Status: DC | PRN

## 2022-03-18 NOTE — Discharge Instructions (Signed)
Outpatient PT

## 2022-03-18 NOTE — Progress Notes (Signed)
? ?      CROSS COVER NOTE ? ?NAME: Dylan [name] ?MRN: [id number] ?DOB : [birth year] ? ? ? ?Date of Service ?  03/18/2022  ?HPI/Events of Note ?  Secure chat received from nursing ". The central tele unit just called to tell me he has been sustaining in the 60s but he's starting to brady down into the low 50s and high 40s He's got ventricaular bigemeny as well" ? ?Dylan [name] is hemodynamically stable and asymptomatic at this time.  ?Interventions ?  Plan: ?EKG ?BMP, Mg, TSH ? ?   ?  ? ?Neomia Glass MHA, MSN, FNP-BC ?Nurse Practitioner ?[facility] ?[place] ?Pager [phone] ? ?

## 2022-03-18 NOTE — Discharge Summary (Signed)
?Physician Discharge Summary ?  ?Patient: Dylan [name] MRN: [id number] DOB: 1934/08/06  ?Admit date:     03/16/2022  ?Discharge date: 03/18/22  ?Discharge Physician: Fritzi Mandes  ? ?PCP: [name], MD  ? ?Recommendations at discharge:  ? ?  ? ?Discharge Diagnoses: ?COVID infection ?Dementia ? ? ?Hospital Course: ?Dylan [name] is a 88 y.o. Caucasian male with medical history significant for dementia, dyslipidemia, PAD, CKD 3, hearing loss, depression, BPH and atrial flutter on Eliquis, who presented to the emergency room with acute onset of generalized weakness with associated nausea and vomiting as well as fever and feeling flushed with no diarrhea. ?  ? Portable chest ray showed no acute cardiopulmonary disease. ?  ?COVID infection ?-- came in with generalized weakness dizziness vomiting and syncopal episode. ?-- Remains in sinus rhythm ?-- received IV fluid ?- tolerating PO ?-- continue PO Monlupiravir x 5 days ?patient ambulated with physical therapy. Recommends home health PT ?--sats 93-95% on RA ?--CXR  no acute cardiopulmonary abnormality ?  ?dementia with depression ?-- continue Namenda and Prozac ?  ?BPH ?-- continue Proscar ?  ?paroxysmal atrial flutter ?-- continue eliquis ?  ?Family communication : wife at bedside ?Consults : none ?CODE STATUS: DNR ?DVT Prophylaxis : eliquis ?Level of care: Telemetry Medical ?Status is: Observation ?The patient remains OBS appropriate and will d/c before 2 midnights. ? ?D/w wife d/c planning. Out pt PT set up. per [place] agency will not offer [place] due to her Insurance ? ?  ? ? ? ?Disposition: Home ?Diet recommendation:  ?Discharge Diet Orders (From admission, onward)  ? ?  Start     Ordered  ? 03/18/22 0000  Diet - low sodium heart healthy       ? 03/18/22 0953  ? ?  ?  ? ?  ? ?Regular diet ?DISCHARGE MEDICATION: ?Allergies as of 03/18/2022   ? ?   Reactions  ? Doxepin Other (See Comments)  ? Lethargy, overly sedating at low dose  ? Lincomycin Hcl Rash  ? ?  ? ?   ?Medication List  ?  ? ?TAKE these medications   ? ?ALIGN PO ?Take by mouth daily. ?  ?apixaban 5 MG Tabs tablet ?Commonly known as: ELIQUIS ?Take 1 tablet (5 mg total) by mouth 2 (two) times daily. ?  ?feeding supplement Liqd ?Take 237 mLs by mouth daily as needed (about 3 times a week). ?  ?fexofenadine 180 MG tablet ?Commonly known as: Allegra Allergy ?Take 1 tablet (180 mg total) by mouth daily. ?  ?finasteride 5 MG tablet ?Commonly known as: PROSCAR ?Take 1 tablet (5 mg total) by mouth daily. ?  ?FLUoxetine 20 MG capsule ?Commonly known as: PROZAC ?Take 1 capsule (20 mg total) by mouth daily. ?  ?guaiFENesin-dextromethorphan 100-10 MG/5ML syrup ?Commonly known as: ROBITUSSIN DM ?Take 10 mLs by mouth every 4 (four) hours as needed for cough. ?  ?memantine 14 MG Cp24 24 hr capsule ?Commonly known as: NAMENDA XR ?Take 1 capsule (14 mg total) by mouth every other day. ?  ?midodrine 5 MG tablet ?Commonly known as: PROAMATINE ?Take 5 mg by mouth 3 (three) times daily. ?  ?MIRALAX PO ?Take by mouth daily. ?  ?molnupiravir EUA 200 mg Caps capsule ?Commonly known as: LAGEVRIO ?Take 4 capsules (800 mg total) by mouth 2 (two) times daily for 5 days. ?  ?SENOKOT PO ?Take 1 tablet by mouth daily. ?  ?vitamin B-12 500 MCG tablet ?Commonly known as: V-R VITAMIN B-12 ?  Take 1 tablet (500 mcg total) by mouth daily. ?  ?Vitamin D3 25 MCG (1000 UT) Caps ?Take 1 capsule (1,000 Units total) by mouth daily. ?  ? ?  ? ?  ?  ? ? ?  ?Durable Medical Equipment  ?(From admission, onward)  ?  ? ? ?  ? ?  Start     Ordered  ? 03/17/22 1339  For home use only DME Walker rolling  Once       ?Question Answer Comment  ?Walker: With 5 Inch Wheels   ?Patient needs a walker to treat with the following condition Weakness   ?  ? 03/17/22 1338  ? ?  ?  ? ?  ? ? Follow-up Information   ? ? [name], MD. Schedule an appointment as soon as possible for a visit in 1 week(s).   ?Specialty: Family Medicine ?Why: hospital f/u ?Contact  information: ?[address] ?[place] [address] ?[phone] ? ? ?  ?  ? ? Camnitz, Ocie Doyne, MD .   ?Specialty: Cardiology ?Contact information: ?[address] ?[address] ?[place] [address] ?[phone] ? ? ?  ?  ? ? Lorretta Harp, MD .   ?Specialties: Cardiology, Radiology ?Contact information: ?[address] ?[address] ?[place] [address] ?[phone] ? ? ?  ?  ? ?  ?  ? ?  ? ?Discharge Exam: ?[name] Weights  ? 03/16/22 2331  ?Weight: 79.4 kg  ? ? ? ?Condition at discharge: fair ? ?The results of significant diagnostics from this hospitalization (including imaging, microbiology, ancillary and laboratory) are listed below for reference.  ? ?Imaging Studies: ?DG Lumbar Spine 2-3 Views ? ?Result Date: 03/16/2022 ?CLINICAL DATA:  88 year old male status post fall in the shower. Recent COVID-19 contact. EXAM: LUMBAR SPINE - 2-3 VIEW COMPARISON:  CT Abdomen and Pelvis 06/23/2021. FINDINGS: Normal lumbar segmentation. Stable lumbar vertebral height and alignment since last year. Advanced chronic disc and endplate degeneration I9-C7 and L5-S1. No acute osseous abnormality identified. Negative visible bowel gas pattern. IMPRESSION: 1. No acute osseous abnormality identified in the lumbar spine. 2. Advanced chronic disc and endplate degeneration at L2-L3 and L5-S1. Electronically Signed   By: Genevie Ann M.D.   On: 03/16/2022 11:38  ? ?DG Pelvis 1-2 Views ? ?Result Date: 03/16/2022 ?CLINICAL DATA:  88 year old male status post fall in the shower. Recent COVID-19 contact. EXAM: PELVIS - 1-2 VIEW COMPARISON:  CT Abdomen and Pelvis 06/23/2021. FINDINGS: Bilateral femoral heads remain normally located. Bone mineralization is within normal limits for age. Pelvis appears intact. SI joints appear symmetric. Grossly intact proximal femurs. Negative visible bowel gas. Chronic pelvic phleboliths mostly on the left. IMPRESSION: No acute fracture or dislocation identified about the pelvis. If there is lateralizing  hip pain dedicated hip series is recommended. Electronically Signed   By: Genevie Ann M.D.   On: 03/16/2022 11:36  ? ?CT Head Wo Contrast ? ?Result Date: 03/17/2022 ?CLINICAL DATA:  Status post fall. EXAM: CT HEAD WITHOUT CONTRAST TECHNIQUE: Contiguous axial images were obtained from the base of the skull through the vertex without intravenous contrast. RADIATION DOSE REDUCTION: This exam was performed according to the departmental dose-optimization program which includes automated exposure control, adjustment of the mA and/or kV according to patient size and/or use of iterative reconstruction technique. COMPARISON:  June 23, 2021 FINDINGS: Brain: There is moderate severity cerebral atrophy with widening of the extra-axial spaces and ventricular dilatation. There are areas of decreased attenuation within the white  matter tracts of the supratentorial brain, consistent with microvascular disease changes. Vascular: No hyperdense vessel or unexpected calcification. Skull: Normal. Negative for fracture or focal lesion. Sinuses/Orbits: No acute finding. Other: None. IMPRESSION: 1. No acute intracranial abnormality. 2. Generalized cerebral atrophy and microvascular disease changes of the supratentorial brain. Electronically Signed   By: Virgina Norfolk M.D.   On: 03/17/2022 00:39  ? ?DG Chest Portable 1 View ? ?Result Date: 03/17/2022 ?CLINICAL DATA:  Fever with dizziness, vomiting and falling. EXAM: PORTABLE CHEST 1 VIEW COMPARISON:  May 04, 2021 FINDINGS: The heart size and mediastinal contours are within normal limits. A radiopaque loop recorder device is seen. Low lung volumes are noted. Both lungs are clear. The visualized skeletal structures are unremarkable. IMPRESSION: No active cardiopulmonary disease. Electronically Signed   By: Virgina Norfolk M.D.   On: 03/17/2022 00:47   ? ?Microbiology: ?Results for orders placed or performed during the hospital encounter of 03/16/22  ?Resp Panel by RT-PCR (Flu A&B, Covid)  Nasopharyngeal Swab     Status: Abnormal  ? Collection Time: 03/16/22 10:55 PM  ? Specimen: Nasopharyngeal Swab; Nasopharyngeal(NP) swabs in vial transport medium  ?Result Value Ref Range Status  ? SARS Coronavirus 2 by

## 2022-03-18 NOTE — Plan of Care (Signed)

## 2022-03-19 LAB — CUP PACEART REMOTE DEVICE CHECK
Date Time Interrogation Session: 20230508231941
Implantable Pulse Generator Implant Date: 20191120

## 2022-03-25 ENCOUNTER — Ambulatory Visit (INDEPENDENT_AMBULATORY_CARE_PROVIDER_SITE_OTHER): Payer: Medicare Other | Admitting: Family Medicine

## 2022-03-25 ENCOUNTER — Encounter: Payer: Self-pay | Admitting: Family Medicine

## 2022-03-25 VITALS — BP 110/60 | HR 53 | Temp 98.0°F | Ht 72.0 in | Wt 172.4 lb

## 2022-03-25 DIAGNOSIS — F03918 Unspecified dementia, unspecified severity, with other behavioral disturbance: Secondary | ICD-10-CM

## 2022-03-25 DIAGNOSIS — U071 COVID-19: Secondary | ICD-10-CM

## 2022-03-25 NOTE — Patient Instructions (Addendum)
I'm glad you're feeling better from COVID! ?Continue current medicines.  ?Let us known if not seeing improvement each day.  ?Reschedule next weeks' appointment to 1 month from now.  ?

## 2022-03-25 NOTE — Progress Notes (Signed)
? ? Patient ID: Dylan [name], male    DOB: 1934-09-22, 88 y.o.   MRN: [id number] ? ?This visit was conducted in person. ? ?BP 110/60   Pulse (!) 53   Temp 98 ?F (36.7 ?C) (Temporal)   Ht 6' (1.829 m)   Wt 172 lb 6 oz (78.2 kg)   SpO2 98%   BMI 23.38 kg/m?   ? ?CC: hosp f/u visit  ?Subjective:  ? ?HPI: ?Dylan [name] is a 88 y.o. male presenting on 03/25/2022 for Hospitalization Follow-up (Admitted on 03/16/22 at [facility], dx COVID; general weakness; syncope. ) ? ? ?Recent hospitalization with COVID-19 infection presenting with generalized weakness, vomiting, fever and recurrent syncope. Treated with IVF, molnupiravir. CXR reassuring. Hospital records reviewed.  ? ?Notes ongoing congestion - he doesn't cough it up. He is using guaifenesin ER '1200mg'$  once daily.  ?No fevers/chills. No headache.  ? ?Sexual disinhibition - last visit we dropped namenda XR down to '14mg'$  every other day, also transitioned off sertraline onto prozac '20mg'$  daily. Namenda frequency decreased (instead of dose) due to large supply at home (they had just filled 90d supply).  ?Since this change, wife notes worsening shaking of R hand and legs. He is right handed. Mentation is possibly a bit better.  ?   ? ?Relevant past medical, surgical, family and social history reviewed and updated as indicated. Interim medical history since our last visit reviewed. ?Allergies and medications reviewed and updated. ?Outpatient Medications Prior to Visit  ?Medication Sig Dispense Refill  ? apixaban (ELIQUIS) 5 MG TABS tablet Take 1 tablet (5 mg total) by mouth 2 (two) times daily. 180 tablet 1  ? Cholecalciferol (VITAMIN D3) 1000 units CAPS Take 1 capsule (1,000 Units total) by mouth daily. 30 capsule   ? feeding supplement (BOOST HIGH PROTEIN) LIQD Take 237 mLs by mouth daily as needed (about 3 times a week).  0  ? fexofenadine (ALLEGRA ALLERGY) 180 MG tablet Take 1 tablet (180 mg total) by mouth daily.    ? finasteride (PROSCAR) 5 MG tablet Take 1 tablet (5 mg  total) by mouth daily. 90 tablet 3  ? FLUoxetine (PROZAC) 20 MG capsule Take 1 capsule (20 mg total) by mouth daily. 30 capsule 6  ? guaiFENesin-dextromethorphan (ROBITUSSIN DM) 100-10 MG/5ML syrup Take 10 mLs by mouth every 4 (four) hours as needed for cough. 118 mL 0  ? memantine (NAMENDA XR) 14 MG CP24 24 hr capsule Take 1 capsule (14 mg total) by mouth every other day.    ? midodrine (PROAMATINE) 5 MG tablet Take 5 mg by mouth 3 (three) times daily.    ? Polyethylene Glycol 3350 (MIRALAX PO) Take by mouth daily.    ? Probiotic Product (ALIGN PO) Take by mouth daily.    ? Sennosides (SENOKOT PO) Take 1 tablet by mouth daily.    ? vitamin B-12 (V-R VITAMIN B-12) 500 MCG tablet Take 1 tablet (500 mcg total) by mouth daily.    ? ?No facility-administered medications prior to visit.  ?  ? ?Per HPI unless specifically indicated in ROS section below ?Review of Systems ? ?Objective:  ?BP 110/60   Pulse (!) 53   Temp 98 ?F (36.7 ?C) (Temporal)   Ht 6' (1.829 m)   Wt 172 lb 6 oz (78.2 kg)   SpO2 98%   BMI 23.38 kg/m?   ?Wt Readings from Last 3 Encounters:  ?03/25/22 172 lb 6 oz (78.2 kg)  ?03/16/22 175 lb (79.4 kg)  ?03/16/22 175  lb (79.4 kg)  ?  ?  ?Physical Exam ?Vitals and nursing note reviewed.  ?Constitutional:   ?   Appearance: Normal appearance. He is not ill-appearing.  ?HENT:  ?   Head: Normocephalic and atraumatic.  ?Eyes:  ?   Extraocular Movements: Extraocular movements intact.  ?   Pupils: Pupils are equal, round, and reactive to light.  ?Cardiovascular:  ?   Rate and Rhythm: Regular rhythm. Bradycardia present.  ?   Pulses: Normal pulses.  ?   Heart sounds: Normal heart sounds.  ?Pulmonary:  ?   Effort: Pulmonary effort is normal. No respiratory distress.  ?   Breath sounds: Normal breath sounds. No wheezing, rhonchi or rales.  ?Musculoskeletal:  ?   Right lower leg: No edema.  ?   Left lower leg: No edema.  ?Skin: ?   General: Skin is warm and dry.  ?   Findings: No rash.  ?Neurological:  ?   Mental  Status: He is alert.  ?Psychiatric:     ?   Mood and Affect: Mood normal.     ?   Behavior: Behavior normal.  ? ?   ? ? ?Assessment & Plan:  ? ?Problem List Items Addressed This Visit   ? ? Dementia with behavioral disturbance  ?  Stable to mild improvement with recent changes (prozac in place of sertraline, dropping namenda XR dosing) - will continue this. ?They will reschedule next weeks' appt for 1 month.  ? ?  ?  ? COVID-19 virus infection - Primary  ?  Recent hospitalization for COVID with related syncope, weakness, vomiting. He did receive antiviral molnupiravir. symptoms overall improved. Supportive measures reviewed.  ? ?  ?  ?  ? ?No orders of the defined types were placed in this encounter. ? ?No orders of the defined types were placed in this encounter. ? ? ? ?Patient Instructions  ?I'm glad you're feeling better from COVID! ?Continue current medicines.  ?Let us known if not seeing improvement each day.  ?Reschedule next weeks' appointment to 1 month from now.  ? ?Follow up plan: ?Return for follow up visit. ? ?[name], MD   ?

## 2022-03-25 NOTE — Assessment & Plan Note (Signed)
Recent hospitalization for COVID with related syncope, weakness, vomiting. He did receive antiviral molnupiravir. symptoms overall improved. Supportive measures reviewed.  ?

## 2022-03-25 NOTE — Assessment & Plan Note (Signed)
Stable to mild improvement with recent changes (prozac in place of sertraline, dropping namenda XR dosing) - will continue this. ?They will reschedule next weeks' appt for 1 month.  ?

## 2022-03-28 NOTE — Assessment & Plan Note (Signed)
-   We will continue Namenda XR and Prozac.

## 2022-03-31 ENCOUNTER — Encounter: Payer: Self-pay | Admitting: Family Medicine

## 2022-03-31 ENCOUNTER — Other Ambulatory Visit: Payer: Self-pay | Admitting: Physician Assistant

## 2022-04-01 NOTE — Telephone Encounter (Signed)
Age 88, weight 78kg, SCr 1.34 on 65/2023  Last OV 09/2021, aflutter indication

## 2022-04-02 ENCOUNTER — Ambulatory Visit: Payer: Medicare Other | Admitting: Family Medicine

## 2022-04-04 NOTE — Progress Notes (Signed)
Carelink Summary Report / Loop Recorder 

## 2022-04-11 ENCOUNTER — Telehealth: Payer: Medicare Other

## 2022-04-16 ENCOUNTER — Emergency Department: Payer: Medicare Other

## 2022-04-16 ENCOUNTER — Encounter: Payer: Self-pay | Admitting: Emergency Medicine

## 2022-04-16 ENCOUNTER — Other Ambulatory Visit: Payer: Self-pay

## 2022-04-16 ENCOUNTER — Other Ambulatory Visit: Payer: Medicare Other | Admitting: Nurse Practitioner

## 2022-04-16 ENCOUNTER — Inpatient Hospital Stay
Admission: EM | Admit: 2022-04-16 | Discharge: 2022-04-23 | DRG: 312 | Disposition: A | Discharge status: Home Health | Payer: Medicare Other | Attending: Family Medicine | Admitting: Family Medicine

## 2022-04-16 DIAGNOSIS — I480 Paroxysmal atrial fibrillation: Secondary | ICD-10-CM | POA: Diagnosis present

## 2022-04-16 DIAGNOSIS — I739 Peripheral vascular disease, unspecified: Secondary | ICD-10-CM | POA: Diagnosis present

## 2022-04-16 DIAGNOSIS — E559 Vitamin D deficiency, unspecified: Secondary | ICD-10-CM | POA: Diagnosis not present

## 2022-04-16 DIAGNOSIS — F0393 Unspecified dementia, unspecified severity, with mood disturbance: Secondary | ICD-10-CM | POA: Diagnosis not present

## 2022-04-16 DIAGNOSIS — E538 Deficiency of other specified B group vitamins: Secondary | ICD-10-CM | POA: Diagnosis present

## 2022-04-16 DIAGNOSIS — N179 Acute kidney failure, unspecified: Secondary | ICD-10-CM | POA: Diagnosis present

## 2022-04-16 DIAGNOSIS — M5030 Other cervical disc degeneration, unspecified cervical region: Secondary | ICD-10-CM | POA: Diagnosis not present

## 2022-04-16 DIAGNOSIS — R410 Disorientation, unspecified: Secondary | ICD-10-CM | POA: Diagnosis not present

## 2022-04-16 DIAGNOSIS — E785 Hyperlipidemia, unspecified: Secondary | ICD-10-CM | POA: Diagnosis not present

## 2022-04-16 DIAGNOSIS — Z66 Do not resuscitate: Secondary | ICD-10-CM | POA: Diagnosis present

## 2022-04-16 DIAGNOSIS — I951 Orthostatic hypotension: Secondary | ICD-10-CM | POA: Diagnosis not present

## 2022-04-16 DIAGNOSIS — I6782 Cerebral ischemia: Secondary | ICD-10-CM | POA: Diagnosis not present

## 2022-04-16 DIAGNOSIS — Z85828 Personal history of other malignant neoplasm of skin: Secondary | ICD-10-CM

## 2022-04-16 DIAGNOSIS — N183 Chronic kidney disease, stage 3 unspecified: Secondary | ICD-10-CM

## 2022-04-16 DIAGNOSIS — R55 Syncope and collapse: Secondary | ICD-10-CM | POA: Diagnosis not present

## 2022-04-16 DIAGNOSIS — D696 Thrombocytopenia, unspecified: Secondary | ICD-10-CM | POA: Diagnosis not present

## 2022-04-16 DIAGNOSIS — E860 Dehydration: Secondary | ICD-10-CM | POA: Diagnosis not present

## 2022-04-16 DIAGNOSIS — Z743 Need for continuous supervision: Secondary | ICD-10-CM | POA: Diagnosis not present

## 2022-04-16 DIAGNOSIS — I453 Trifascicular block: Secondary | ICD-10-CM | POA: Diagnosis present

## 2022-04-16 DIAGNOSIS — Z79899 Other long term (current) drug therapy: Secondary | ICD-10-CM

## 2022-04-16 DIAGNOSIS — H9190 Unspecified hearing loss, unspecified ear: Secondary | ICD-10-CM | POA: Diagnosis present

## 2022-04-16 DIAGNOSIS — Y92002 Bathroom of unspecified non-institutional (private) residence single-family (private) house as the place of occurrence of the external cause: Secondary | ICD-10-CM

## 2022-04-16 DIAGNOSIS — I4892 Unspecified atrial flutter: Secondary | ICD-10-CM | POA: Diagnosis present

## 2022-04-16 DIAGNOSIS — S199XXA Unspecified injury of neck, initial encounter: Secondary | ICD-10-CM | POA: Diagnosis not present

## 2022-04-16 DIAGNOSIS — Z881 Allergy status to other antibiotic agents status: Secondary | ICD-10-CM

## 2022-04-16 DIAGNOSIS — Z9181 History of falling: Secondary | ICD-10-CM

## 2022-04-16 DIAGNOSIS — Z888 Allergy status to other drugs, medicaments and biological substances status: Secondary | ICD-10-CM

## 2022-04-16 DIAGNOSIS — Z8619 Personal history of other infectious and parasitic diseases: Secondary | ICD-10-CM | POA: Diagnosis not present

## 2022-04-16 DIAGNOSIS — R008 Other abnormalities of heart beat: Secondary | ICD-10-CM | POA: Diagnosis present

## 2022-04-16 DIAGNOSIS — Z87891 Personal history of nicotine dependence: Secondary | ICD-10-CM

## 2022-04-16 DIAGNOSIS — Z7901 Long term (current) use of anticoagulants: Secondary | ICD-10-CM

## 2022-04-16 DIAGNOSIS — G319 Degenerative disease of nervous system, unspecified: Secondary | ICD-10-CM | POA: Diagnosis not present

## 2022-04-16 DIAGNOSIS — W1839XA Other fall on same level, initial encounter: Secondary | ICD-10-CM | POA: Diagnosis present

## 2022-04-16 DIAGNOSIS — W2209XA Striking against other stationary object, initial encounter: Secondary | ICD-10-CM | POA: Diagnosis present

## 2022-04-16 DIAGNOSIS — W19XXXA Unspecified fall, initial encounter: Secondary | ICD-10-CM | POA: Diagnosis not present

## 2022-04-16 DIAGNOSIS — S0990XA Unspecified injury of head, initial encounter: Secondary | ICD-10-CM | POA: Diagnosis not present

## 2022-04-16 DIAGNOSIS — N1831 Chronic kidney disease, stage 3a: Secondary | ICD-10-CM | POA: Diagnosis not present

## 2022-04-16 DIAGNOSIS — I498 Other specified cardiac arrhythmias: Secondary | ICD-10-CM

## 2022-04-16 DIAGNOSIS — N400 Enlarged prostate without lower urinary tract symptoms: Secondary | ICD-10-CM | POA: Diagnosis present

## 2022-04-16 DIAGNOSIS — M4602 Spinal enthesopathy, cervical region: Secondary | ICD-10-CM | POA: Diagnosis not present

## 2022-04-16 DIAGNOSIS — N529 Male erectile dysfunction, unspecified: Secondary | ICD-10-CM | POA: Diagnosis present

## 2022-04-16 HISTORY — DX: Syncope and collapse: R55

## 2022-04-16 HISTORY — DX: Bradycardia, unspecified: R00.1

## 2022-04-16 LAB — CBC WITH DIFFERENTIAL/PLATELET
Abs Immature Granulocytes: 0.03 10*3/uL (ref 0.00–0.07)
Basophils Absolute: 0 10*3/uL (ref 0.0–0.1)
Basophils Relative: 1 %
Eosinophils Absolute: 0.1 10*3/uL (ref 0.0–0.5)
Eosinophils Relative: 1 %
HCT: 45.2 % (ref 39.0–52.0)
Hemoglobin: 15.5 g/dL (ref 13.0–17.0)
Immature Granulocytes: 0 %
Lymphocytes Relative: 12 %
Lymphs Abs: 1 10*3/uL (ref 0.7–4.0)
MCH: 31.1 pg (ref 26.0–34.0)
MCHC: 34.3 g/dL (ref 30.0–36.0)
MCV: 90.8 fL (ref 80.0–100.0)
Monocytes Absolute: 0.6 10*3/uL (ref 0.1–1.0)
Monocytes Relative: 8 %
Neutro Abs: 6.2 10*3/uL (ref 1.7–7.7)
Neutrophils Relative %: 78 %
Platelets: 86 10*3/uL — ABNORMAL LOW (ref 150–400)
RBC: 4.98 MIL/uL (ref 4.22–5.81)
RDW: 13.2 % (ref 11.5–15.5)
WBC: 7.9 10*3/uL (ref 4.0–10.5)
nRBC: 0 % (ref 0.0–0.2)

## 2022-04-16 LAB — BASIC METABOLIC PANEL
Anion gap: 4 — ABNORMAL LOW (ref 5–15)
BUN: 17 mg/dL (ref 8–23)
CO2: 29 mmol/L (ref 22–32)
Calcium: 9.6 mg/dL (ref 8.9–10.3)
Chloride: 106 mmol/L (ref 98–111)
Creatinine, Ser: 1.5 mg/dL — ABNORMAL HIGH (ref 0.61–1.24)
GFR, Estimated: 45 mL/min — ABNORMAL LOW (ref >60)
Glucose, Bld: 99 mg/dL (ref 70–99)
Potassium: 4.5 mmol/L (ref 3.5–5.1)
Sodium: 139 mmol/L (ref 135–145)

## 2022-04-16 LAB — CK: Total CK: 52 U/L (ref 49–397)

## 2022-04-16 LAB — TROPONIN I (HIGH SENSITIVITY): Troponin I (High Sensitivity): 7 ng/L (ref <18)

## 2022-04-16 MED ORDER — GUAIFENESIN-DM 100-10 MG/5ML PO SYRP: 10.0000 mL | ORAL_SOLUTION | ORAL | Status: DC | PRN

## 2022-04-16 MED ORDER — RISAQUAD PO CAPS
1.0000 | ORAL_CAPSULE | Freq: Every day | ORAL | Status: DC
  Administered 2022-04-17 – 2022-04-23 (×7): 1 via ORAL
  Filled 2022-04-16 (×7): qty 1

## 2022-04-16 MED ORDER — LORATADINE 10 MG PO TABS
10.0000 mg | ORAL_TABLET | Freq: Every day | ORAL | Status: DC
  Administered 2022-04-16 – 2022-04-23 (×8): 10 mg via ORAL
  Filled 2022-04-16 (×8): qty 1

## 2022-04-16 MED ORDER — APIXABAN 2.5 MG PO TABS
2.5000 mg | ORAL_TABLET | Freq: Two times a day (BID) | ORAL | Status: DC
Start: 2022-04-16 — End: 2022-04-16
  Filled 2022-04-16 (×2): qty 1

## 2022-04-16 MED ORDER — LACTATED RINGERS IV BOLUS
1000.0000 mL | Freq: Once | INTRAVENOUS | Status: AC
  Administered 2022-04-16: 1000 mL via INTRAVENOUS

## 2022-04-16 MED ORDER — MEMANTINE HCL ER 14 MG PO CP24
14.0000 mg | ORAL_CAPSULE | ORAL | Status: DC
  Administered 2022-04-17 – 2022-04-23 (×4): 14 mg via ORAL
  Filled 2022-04-16 (×4): qty 1

## 2022-04-16 MED ORDER — FINASTERIDE 5 MG PO TABS
5.0000 mg | ORAL_TABLET | Freq: Every day | ORAL | Status: DC
  Administered 2022-04-16 – 2022-04-23 (×8): 5 mg via ORAL
  Filled 2022-04-16 (×8): qty 1

## 2022-04-16 MED ORDER — FLUOXETINE HCL 20 MG PO CAPS
20.0000 mg | ORAL_CAPSULE | Freq: Every day | ORAL | Status: DC
  Administered 2022-04-16 – 2022-04-23 (×8): 20 mg via ORAL
  Filled 2022-04-16 (×8): qty 1

## 2022-04-16 MED ORDER — CYANOCOBALAMIN 500 MCG PO TABS
500.0000 ug | ORAL_TABLET | Freq: Every day | ORAL | Status: DC
  Administered 2022-04-16 – 2022-04-22 (×7): 500 ug via ORAL
  Filled 2022-04-16 (×7): qty 1

## 2022-04-16 MED ORDER — MIDODRINE HCL 5 MG PO TABS
5.0000 mg | ORAL_TABLET | Freq: Three times a day (TID) | ORAL | Status: DC
  Administered 2022-04-16 – 2022-04-23 (×20): 5 mg via ORAL
  Filled 2022-04-16 (×21): qty 1

## 2022-04-16 MED ORDER — ACETAMINOPHEN 325 MG PO TABS
650.0000 mg | ORAL_TABLET | Freq: Four times a day (QID) | ORAL | Status: DC | PRN
  Administered 2022-04-21: 650 mg via ORAL
  Filled 2022-04-16 (×2): qty 2

## 2022-04-16 MED ORDER — VITAMIN D 25 MCG (1000 UNIT) PO TABS
1000.0000 [IU] | ORAL_TABLET | Freq: Every day | ORAL | Status: DC
Start: 2022-04-16 — End: 2022-04-23
  Administered 2022-04-16 – 2022-04-22 (×7): 1000 [IU] via ORAL
  Filled 2022-04-16 (×7): qty 1

## 2022-04-16 MED ORDER — SENNA 8.6 MG PO TABS
1.0000 | ORAL_TABLET | Freq: Every evening | ORAL | Status: DC | PRN
  Filled 2022-04-16: qty 1

## 2022-04-16 MED ORDER — ONDANSETRON HCL 4 MG/2ML IJ SOLN: 4.0000 mg | Freq: Three times a day (TID) | INTRAMUSCULAR | Status: DC | PRN

## 2022-04-16 NOTE — ED Provider Notes (Signed)
[facility] Provider Note    Event Date/Time   First MD Initiated Contact with Patient 04/16/22 [phone]     (approximate)   History   Chief Complaint Fall   HPI  Dylan [name] is a 88 y.o. male with past medical history of hyperlipidemia, PAD, CKD, atrial fibrillation on Eliquis, and dementia who presents to the ED for syncope and fall.  Wife states that she found patient down on the ground in the bathroom this morning looking "white as a sheet."  She states that she was not sure whether he passed out or had fallen, but she was able to get him up beside the bathroom sink.  He then appeared to pass out and fall to the side, striking his head on the ground.  EMS was called to bring patient to the ED, wife now states that he seems to be at his baseline mental status and he denies any complaints.  He specifically denies any headache, neck pain, chest pain, abdominal pain, or extremity pain.  Wife states that he has had passed out multiple times in the past, follows with Dr. Curt Bears of cardiology and has a monitor in place.  Wife states that prior syncopal work-ups have been unremarkable.  He has otherwise been well recently with no fevers, cough, dysuria, vomiting, or diarrhea.     Physical Exam   Triage Vital Signs: ED Triage Vitals  Enc Vitals Group     BP 04/16/22 0653 105/90     Pulse Rate 04/16/22 0653 (!) 55     Resp 04/16/22 0653 18     Temp 04/16/22 0653 97.8 F (36.6 C)     Temp Source 04/16/22 0653 Oral     SpO2 04/16/22 0653 99 %     Weight 04/16/22 0655 187 lb (84.8 kg)     Height 04/16/22 0655 '6\' 1"'$  (1.854 m)     Head Circumference --      Peak Flow --      Pain Score --      Pain Loc --      Pain Edu? --      Excl. in [place]? --     Most recent vital signs: Vitals:   04/16/22 0653  BP: 105/90  Pulse: (!) 55  Resp: 18  Temp: 97.8 F (36.6 C)  SpO2: 99%    Constitutional: Alert and oriented to person and place, but not time or  situation. Eyes: Conjunctivae are normal. Head: Atraumatic. Nose: No congestion/rhinnorhea. Mouth/Throat: Mucous membranes are moist.  Neck: No midline cervical spine tenderness to palpation. Cardiovascular: Normal rate, regular rhythm. Grossly normal heart sounds.  2+ radial pulses bilaterally. Respiratory: Normal respiratory effort.  No retractions. Lungs CTAB.  No chest wall tenderness to palpation. Gastrointestinal: Soft and nontender. No distention. Musculoskeletal: No lower extremity tenderness nor edema.  No upper extremity bony tenderness to palpation. Neurologic:  Normal speech and language. No gross focal neurologic deficits are appreciated.    ED Results / Procedures / Treatments   Labs (all labs ordered are listed, but only abnormal results are displayed) Labs Reviewed  CBC WITH DIFFERENTIAL/PLATELET - Abnormal; Notable for the following components:      Result Value   Platelets 86 (*)    All other components within normal limits  BASIC METABOLIC PANEL - Abnormal; Notable for the following components:   Creatinine, Ser 1.50 (*)    GFR, Estimated 45 (*)    Anion gap 4 (*)  All other components within normal limits  TROPONIN I (HIGH SENSITIVITY)  TROPONIN I (HIGH SENSITIVITY)     EKG  ED ECG REPORT I, Blake Divine, the attending physician, personally viewed and interpreted this ECG.   Date: 04/16/2022  EKG Time: 9:10  Rate: 67  Rhythm: normal sinus rhythm, ventricular bigeminy  Axis: Normal  Intervals:nonspecific intraventricular conduction delay  ST&T Change: Nonspecific ST/T abnormalities  RADIOLOGY CT head reviewed and interpreted by me with no hemorrhage or midline shift.  PROCEDURES:  Critical Care performed: No  .1-3 Lead EKG Interpretation Performed by: Blake Divine, MD Authorized by: Blake Divine, MD     Interpretation: abnormal     ECG rate:  55-70   ECG rate assessment: normal     Rhythm: sinus rhythm     Ectopy: bigeminy      Conduction: normal     MEDICATIONS ORDERED IN ED: Medications  lactated ringers bolus 1,000 mL (1,000 mLs Intravenous New Bag/Given 04/16/22 0931)     IMPRESSION / MDM / [place] / ED COURSE  I reviewed the triage vital signs and the nursing notes.                              88 y.o. male with past medical history of hyperlipidemia, PAD, CKD, atrial fibrillation on Eliquis, and dementia who presents to the ED for syncopal episode and fall this morning when he struck his head.  Patient's presentation is most consistent with acute presentation with potential threat to life or bodily function.  Differential diagnosis includes, but is not limited to, intracranial hemorrhage, skull fracture, cervical spine injury, extremity injury, arrhythmia, ACS, PE, dehydration, electrolyte abnormality, vasovagal episode.  Patient well-appearing and in no acute distress, vital signs are unremarkable.  CT head and cervical spine performed from triage and negative for acute traumatic injury, no evidence of traumatic injury to his trunk or extremities.  Plan to screen EKG and labs including troponin for his syncopal episode.  He currently appears at his baseline mental status with no focal neurologic deficits, no reason to suspect stroke.  No symptoms to suspect infectious process.  He does have a history of recurrent syncopal episodes in the past with unremarkable work-up.  EKG shows normal sinus rhythm with ventricular bigeminy, no ischemic changes noted.  Labs are reassuring with no anemia, AKI, or electrolyte abnormality.  Troponin within normal limits and patient remains in normal sinus rhythm on cardiac monitor, although with his bigeminy I am concerned for cardiac etiology for syncope.  Case discussed with hospitalist for admission.  The patient is on the cardiac monitor to evaluate for evidence of arrhythmia and/or significant heart rate changes.      FINAL CLINICAL IMPRESSION(S) / ED  DIAGNOSES   Final diagnoses:  Syncope, unspecified syncope type  Ventricular bigeminy     Rx / DC Orders   ED Discharge Orders     None        Note:  This document was prepared using Dragon voice recognition software and may include unintentional dictation errors.   Blake Divine, MD 04/16/22 1048

## 2022-04-16 NOTE — Consult Note (Signed)
Cardiology Consult    Patient ID: Dylan [name] MRN: [id number], DOB/AGE: 1934-04-25   Admit date: 04/16/2022 Date of Consult: 04/16/2022  Primary Physician: Dylan Bush, MD Primary Cardiologist: Dylan Burow, MD/EP: Dylan [name] Requesting Provider: Mora Bellman, MD  Patient Profile    Dylan [name] is a 88 y.o. male with a history of recurrent syncope, orthostatic hypotension, PAD, RBBB, CKD III, and dementia, who is being seen today for the evaluation of syncope at the request of Dr. Blaine [name].  Past Medical History   Past Medical History:  Diagnosis Date   Basal cell carcinoma 08/31/2020   Mid forehead - ED&C    Basal cell carcinoma of right nasal sidewall 12/15/2014   Bilateral hearing loss 08/03/2018   S/p audiology evaluation, has new hearing aides.   Bradycardia    a. 08/2018 ETT: Poor ex tolerance (2:10); b. 09/2018 s/p MDT Linq.   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 09/2018 s/p MDT Linq; b. 10/2021 Echo: EF 55-60%, no rwma, nl RV fxn, mild MR, triv AI.   Thrombocytopenia ([place]) 02/21/2011   periph smear stable 02/2020   Vitamin B12 deficiency 08/03/2018   Completed b12 shots 09/2018, maintaining levels on oral replacement (dissolvable tablets) 01/2019   Vitamin D deficiency 05/19/2018    Past Surgical History:  Procedure Laterality Date   BASAL CELL CARCINOMA EXCISION Right 01/2015   with reconstructive surgery around eye   CATARACT EXTRACTION Left 03/2017   ETT  09/2018   negative for ischemia, extremely poor exercise tolerance ([place])   FOOT SURGERY Left 1998   metatarsal amputation after injury   KNEE ARTHROSCOPY  1993   Right   KNEE ARTHROSCOPY  01/2001   Left   LOOP RECORDER INSERTION N/A 09/30/2018   Procedure: LOOP RECORDER INSERTION;  Surgeon: Constance Haw, MD;  Location: [facility];  Service: Cardiovascular;  Laterality: N/A;     Allergies  Allergies  Allergen Reactions   Doxepin Other (See Comments)    Lethargy, overly sedating at low dose   Lincomycin Hcl Rash    History of Present Illness    88 y.o. male with a history of recurrent syncope, orthostatic hypotension on midodrine, PAD, RBBB, CKD III, and dementia.  He has a h/o recurrent syncope dating back to 2019.  He underwent MDT Linq placement @ that time.  He has  since been followed by Dr. Curt [name] and in 09/2021, he was noted to have paroxysmal atrial flutter w/ controlled rate, and was placed on eliquis.  In the setting of dementia, Dylan [name] isn't able to provide many details related to his history, however, his wife notes that he has never had symptomatic afib/flutter.  Prior to this AM, he's only had one syncopal spell since January, and that occurred in early May in the setting of COVID infection, for which he was admitted, and subsequently d/c'd home on monupiravir on 5/8.   Activity is limited at home and per wife, pt is relatively nonverbal.  This AM, pts wife used the bathroom at 4 am. She then awoke again at 6 am, after her alarm went off.  When she went into the bathroom, she found Dylan [name] sitting on the floor.  He had had a bowel movement on the floor.  She noted that he was very pale.  She helped him to stand and he leaned up against the sink while she cleaned his stool off of the floor.  His wife than noted that he suddenly lost consciousness and fell sideways, striking his head on the door frame, before falling to the floor.  She notes that he regained consciousness within a few seconds of landing on the floor.  She called EMS and he was taken to the [facility] ED (Pts wife unaware of VS in the field).  In the ED, BP was 105/90 w/ HR 55.  ECG showed sinus rhythm @ 67 w/ ventricular bigeminy, LAD, and RBBB (old).  Labs notable for creat of 1.5, which is on the higher end of his trend.  HsTrop nl @ 7, H/H nl, and unremarkable CT head/cervical spine.  Currently, pt w/o complaints.  Inpatient Medications     acidophilus  1 capsule Oral Daily   apixaban  2.5 mg Oral BID   cholecalciferol  1,000 Units Oral QHS   finasteride  5 mg Oral Daily   FLUoxetine  20 mg Oral Daily   loratadine  10 mg Oral Daily   [START ON 04/17/2022] memantine  14 mg Oral QODAY   midodrine  5 mg Oral TID AC   vitamin B-12  500 mcg Oral QHS    Family History    Family History  Problem Relation Age of Onset   Congestive Heart Failure Mother    Stroke Father 35   Congestive Heart Failure Father    Cataracts Sister    Leukemia Brother    He indicated that his mother is deceased. He indicated that his father is deceased. He indicated that his sister is alive. He indicated that two of his three brothers are alive. He indicated that his maternal grandmother is deceased. He indicated that his maternal grandfather is deceased. He indicated that his paternal grandmother is deceased. He indicated  that his paternal grandfather is deceased.   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, minerals, Glass blower/designer  Tobacco Use   Smoking status: Former    Types: Cigarettes    Quit date: 11/11/1960    Years since quitting: 61.4   Smokeless tobacco: Never  Vaping Use   Vaping Use:  Never used  Substance and Sexual Activity   Alcohol use: No   Drug use: No   Sexual activity: Not on file  Other Topics Concern   Not on file  Social History Narrative   Lives with wife [facility])   One stepson   Occ: retired, Furniture conservator/restorer, 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  Cardiovascular:  +++ orthostasis/syncope.  No chest pain, dyspnea on exertion, edema, orthopnea, palpitations, paroxysmal nocturnal dyspnea. Dermatological: No rash, lesions/masses Respiratory: No cough, dyspnea Urologic: No hematuria, dysuria Abdominal:   No nausea, vomiting, diarrhea, bright red blood per rectum, melena, or hematemesis Neurologic:  +++ dementia/disorientation.  No visual changes, wkns, changes in mental status. All other systems reviewed and are otherwise negative except as noted above.  Physical Exam    Blood pressure (!) 105/57, pulse 62, temperature 97.8 F (36.6 C), temperature source Oral, resp. rate (!) 25, height '6\' 1"'$  (1.854  m), weight 84.8 kg, SpO2 99 %.  General: Pleasant, NAD Psych: Normal affect. Neuro: Disoriented to time/place. Moves all extremities spontaneously. HEENT: Normal  Neck: Supple without bruits or JVD. Lungs:  Resp regular and unlabored, CTA. Heart: RRR no s3, s4, or murmurs. Abdomen: Soft, non-tender, non-distended, BS + x 4.  Extremities: No clubbing, cyanosis or edema. DP/PT2+, Radials 2+ and equal bilaterally.  Labs    Cardiac Enzymes Recent Labs  Lab 04/16/22 0920  TROPONINIHS 7     BNP    Component Value Date/Time   BNP 296.8 (H) 03/17/2022 0506    Lab Results  Component Value Date   WBC 7.9 04/16/2022   HGB 15.5 04/16/2022   HCT 45.2 04/16/2022   MCV 90.8 04/16/2022   PLT 86 (L) 04/16/2022    Recent Labs  Lab 04/16/22 0920  NA 139  K 4.5  CL 106  CO2 29  BUN 17  CREATININE 1.50*  CALCIUM 9.6  GLUCOSE 99   Lab Results  Component Value Date   CHOL 210 (H) 08/20/2021   HDL 36.70 (L) 08/20/2021   LDLCALC 113 (H) 08/04/2019   TRIG 332.0 (H) 08/20/2021   Lab Results  Component Value Date   DDIMER <0.27 03/17/2022    Radiology Studies    CT Head Wo Contrast  Result Date: 04/16/2022 CLINICAL DATA:  Head trauma.  Fell while getting out of bed. EXAM: CT HEAD WITHOUT CONTRAST CT CERVICAL SPINE WITHOUT CONTRAST TECHNIQUE: Multidetector CT imaging of the head and cervical spine was performed following the standard protocol without intravenous contrast. Multiplanar CT image reconstructions of the cervical spine were also generated. RADIATION DOSE REDUCTION: This exam was performed according to the departmental dose-optimization program which includes automated exposure control, adjustment of the mA and/or kV according to patient size and/or use of iterative reconstruction technique. COMPARISON:  CT head 03/17/2022 FINDINGS: CT HEAD FINDINGS Brain: No evidence of acute infarction, hemorrhage, hydrocephalus, extra-axial collection or mass lesion/mass effect. Moderate  severity cerebral and cerebellar atrophy with increase extra-axial spaces and ventricular dilatation. Unchanged. There is mild  diffuse low-attenuation within the periventricular white matter compatible with chronic microvascular disease. Vascular: No hyperdense vessel or unexpected calcification. Skull: Normal. Negative for fracture or focal lesion. Sinuses/Orbits: No acute finding. Other: None. CT CERVICAL SPINE FINDINGS Alignment: No acute posttraumatic malalignment of the cervical spine. Skull base and vertebrae: No acute fracture. No primary bone lesion or focal pathologic process. Soft tissues and spinal canal: No prevertebral fluid or swelling. No visible canal hematoma. Disc levels: There is marked disc space narrowing and endplate spurring identified at C6-7. Upper chest: Negative. Other: None IMPRESSION: 1. No acute intracranial abnormality. 2. Chronic small vessel ischemic disease and cerebral atrophy. 3. No evidence for cervical spine fracture or subluxation. 4. Cervical degenerative disc disease. Electronically Signed   By: Kerby Moors M.D.   On: 04/16/2022 07:56   CT Cervical Spine Wo Contrast  Result Date: 04/16/2022 CLINICAL DATA:  Head trauma.  Fell while getting out of bed. EXAM: CT HEAD WITHOUT CONTRAST CT CERVICAL SPINE WITHOUT CONTRAST TECHNIQUE: Multidetector CT imaging of the head and cervical spine was performed following the standard protocol without intravenous contrast. Multiplanar CT image reconstructions of the cervical spine were also generated. RADIATION DOSE REDUCTION: This exam was performed according to the departmental dose-optimization program which includes automated exposure control, adjustment of the mA and/or kV according to patient size and/or use of iterative reconstruction technique. COMPARISON:  CT head 03/17/2022 FINDINGS: CT HEAD FINDINGS Brain: No evidence of acute infarction, hemorrhage, hydrocephalus, extra-axial collection or mass lesion/mass effect. Moderate  severity cerebral and cerebellar atrophy with increase extra-axial spaces and ventricular dilatation. Unchanged. There is mild diffuse low-attenuation within the periventricular white matter compatible with chronic microvascular disease. Vascular: No hyperdense vessel or unexpected calcification. Skull: Normal. Negative for fracture or focal lesion. Sinuses/Orbits: No acute finding. Other: None. CT CERVICAL SPINE FINDINGS Alignment: No acute posttraumatic malalignment of the cervical spine. Skull base and vertebrae: No acute fracture. No primary bone lesion or focal pathologic process. Soft tissues and spinal canal: No prevertebral fluid or swelling. No visible canal hematoma. Disc levels: There is marked disc space narrowing and endplate spurring identified at C6-7. Upper chest: Negative. Other: None IMPRESSION: 1. No acute intracranial abnormality. 2. Chronic small vessel ischemic disease and cerebral atrophy. 3. No evidence for cervical spine fracture or subluxation. 4. Cervical degenerative disc disease. Electronically Signed   By: Kerby Moors M.D.   On: 04/16/2022 07:56   ECG & Cardiac Imaging    Sinus rhythm, 67, ventricular bigeminy, LAD, RBBB - personally reviewed.  Assessment & Plan    1.  Syncope:  Pt w/ h/o recurrent syncope s/p Linq in 2019.  Interrogations have never shown significant arrhythmias, though he has a h/o bradycardia assoc w/ ventricular bigeminy.  His wife found him sitting on the bathroom floor this AM at 6am.  He was pale and lethargic.  She helped him to stand and lean on the sink, and then he suffered a syncopal spell, falling and striking his head on the door frame and then the floor, before regaining consciousness.  In the ED, BP soft @ 105/90, HR 55, ECG sinus @ 67 w/ ventricular bigeminy, LAD, and RBBB.  Labs notable for creat of 1.5, which I on the higher end of his trend.  HsTrop nl @ 7.  Head/Cervical Spine CT w/o acute findings.  Currently asymptomatic and  hemodynamically stable.  Nl EF by echo in 10/2021.  Linq interrogation w/ PAF (12.5% burden), periodic bradycardia - generally during hours of sleep and  assoc w/ bigeminy, but no significant arrhythmias occurring today.  Follow on tele.  Check orthostatics.  Hold eliquis.j  2.  Orthostatic Hypotension:  see above.  Resume midodrine.  Check orthostatics.  3.  CKD III:  Creat 1.5. Likely dry, contributing to above.  Received IVF in ED.  Follow.  4.  PAF:  12.5% AT/AF burden by device interrogation.  Findings somewhat confounding however, b/c tachycardia episodes not recorded.  Currently sinus w/ PVCs.  He has been on eliquis since 09/2021, however in light of syncope and high risk for falls, w/ fall this AM resulting in him striking his head, we rec discontinuation of eliquis going forward.  Discussed w/ wife who is in agreement.   Risk Assessment/Risk Scores:           Signed, Murray Hodgkins, NP 04/16/2022, 4:01 PM  For questions or updates, please contact   Please consult [email or web address] for contact info under Cardiology/STEMI.

## 2022-04-16 NOTE — ED Triage Notes (Signed)
Pt to triage via recliner from home via EMS; EMS reports pt fell getting OOB; no LOC, skin tear to left knee; hx dementia, mental status at baseline; pt denies c/o but st he "may have hit his head"

## 2022-04-16 NOTE — H&P (Signed)
History and Physical    Dylan [name] QAS:[id number] DOB: 07-22-1934 DOA: 04/16/2022  Referring MD/NP/PA:   PCP: [name], MD   Patient coming from:  The patient is coming from home.        Chief Complaint: syncope  HPI: Dylan [name] is a 88 y.o. male with medical history significant of dementia, hyperlipidemia, A flutter on Eliquis, CKD stage IIIa, BPH, orthostatic hypotension, thrombocytopenia, PAD, C. difficile, hard of hearing, who presents with recurrent syncope.  Patient has history of syncope, currently has been monitored by cardiologist.  Patient has loop recorder placed.  Per his wife, patient has had 2 episode of syncope since this morning.  First syncope happened when he was in the bathroom not witnessed.  Wife states that patient had second syncope which lasted for few seconds.  Patient fell and possibly injured his head.  Patient does not have unilateral numbness or tinglings in extremities, no facial droop or slurred speech.  Patient moves all extremities normally.  Patient does not have chest pain or shortness of breath.  He has some mild dry cough.  Patient has sticky loose stool this morning, but no active nausea, vomiting, diarrhea or abdominal pain.  No symptoms of UTI.  Patient's mental status is at his baseline per his wife.  Data Reviewed and ED Course: pt was found to have WBC 7.9, troponin level 7, renal function slightly worsening than baseline, temperature normal, blood pressure 105/90, heart rate 55, RR 18, oxygen sat 99% on room air.  CT head negative.  CT of C-spine negative for acute injury.  EKG: I have personally reviewed.  Sinus rhythm, QTc 439, bifascicular block, ventricular bigeminy.   Review of Systems:   General: no fevers, chills, no body weight gain, fatigue HEENT: no blurry vision, hearing changes or sore throat Respiratory: no dyspnea, has mild coughing, no wheezing CV: no chest pain, no palpitations GI: no nausea, vomiting, abdominal  pain, diarrhea, constipation GU: no dysuria, burning on urination, increased urinary frequency, hematuria  Ext: no leg edema Neuro: no unilateral weakness, numbness, or tingling, no vision change or hearing loss.  Has syncope Skin: no rash, no skin tear. MSK: No muscle spasm, no deformity, no limitation of range of movement in spin Heme: No easy bruising.  Travel history: No recent long distant travel.   Allergy:  Allergies  Allergen Reactions   Doxepin Other (See Comments)    Lethargy, overly sedating at low dose   Lincomycin Hcl Rash    Past Medical History:  Diagnosis Date   Basal cell carcinoma 08/31/2020   Mid forehead - ED&C    Basal cell carcinoma of right nasal sidewall 12/15/2014   Bilateral hearing loss 08/03/2018   S/p audiology evaluation, has new hearing aides.   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/2014   Mohs defect of eyelid 12/14/2014   Nuclear sclerosis, left 04/01/2017   Obstruction of right lacrimal duct 10/16/2015  PAD (peripheral artery disease) ([place]) 03/21/2020   Abnormal ABI, abnormal arterial duplex 03/2020:  R - moderate RLE disease, abnormal TBI L - WNL, abnormal TBI Referred for vascular consult - rec medical management   Pruritus 02/19/2020   Recurrent syncope 06/22/2018   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   Right leg swelling 02/19/2020   Sepsis ([place])  05/30/2020   Thrombocytopenia ([place]) 02/21/2011   periph smear stable 02/2020   Vitamin B12 deficiency 08/03/2018   Completed b12 shots 09/2018, maintaining levels on oral replacement (dissolvable tablets) 01/2019   Vitamin D deficiency 05/19/2018    Past Surgical History:  Procedure Laterality Date   BASAL CELL CARCINOMA EXCISION Right 01/2015   with reconstructive surgery around eye   CATARACT EXTRACTION Left 03/2017   ETT  09/2018   negative for ischemia, extremely poor exercise tolerance ([place])   FOOT SURGERY Left 1998   metatarsal amputation after injury   KNEE ARTHROSCOPY  1993   Right   KNEE ARTHROSCOPY  01/2001   Left   LOOP RECORDER INSERTION N/A 09/30/2018   Procedure: LOOP RECORDER INSERTION;  Surgeon: Constance Haw, MD;  Location: [facility];  Service: Cardiovascular;  Laterality: N/A;    Social History:  reports that he quit smoking about 61 years ago. His smoking use included cigarettes. He has never used smokeless tobacco. He reports that he does not drink alcohol and does not use drugs.  Family History:  Family History  Problem Relation Age of Onset   Congestive Heart Failure Mother    Stroke Father 36   Congestive Heart Failure Father    Cataracts Sister    Leukemia Brother      Prior to Admission medications   Medication Sig Start Date End Date Taking? Authorizing Provider  apixaban (ELIQUIS) 5 MG TABS tablet Take 1 tablet by mouth twice daily 04/01/22  Yes Baldwin Jamaica, PA-C  Cholecalciferol (VITAMIN D3) 1000 units CAPS Take 1 capsule (1,000 Units total) by mouth daily. 08/03/18  Yes [name], MD  fexofenadine [facility] ALLERGY) 180 MG tablet Take 1 tablet (180 mg total) by mouth daily. 02/19/22  Yes [name], MD  finasteride (PROSCAR) 5 MG tablet Take 1 tablet (5 mg total) by mouth daily. 08/27/21  Yes [name], MD  FLUoxetine (PROZAC) 20 MG capsule Take 1 capsule (20 mg total) by mouth daily. 02/19/22  Yes  [name], MD  memantine (NAMENDA XR) 14 MG CP24 24 hr capsule Take 1 capsule (14 mg total) by mouth every other day. 02/19/22  Yes [name], MD  midodrine (PROAMATINE) 5 MG tablet Take 5 mg by mouth 3 (three) times daily. 09/17/21  Yes [provider]  Polyethylene Glycol 3350 (MIRALAX PO) Take by mouth daily.   Yes [provider]  Probiotic Product (ALIGN PO) Take by mouth daily.   Yes [provider]  Sennosides (SENOKOT PO) Take 1 tablet by mouth daily.   Yes [provider]  vitamin B-12 (V-R VITAMIN B-12) 500 MCG tablet Take 1 tablet (500 mcg total) by mouth daily. 08/11/19  Yes [name], MD  feeding supplement (BOOST HIGH PROTEIN) LIQD Take 237 mLs by mouth daily as needed (about 3 times a week). 01/11/22   [name], MD  guaiFENesin-dextromethorphan [facility] DM) 100-10 MG/5ML syrup Take 10 mLs by mouth every 4 (four) hours as needed for cough. 03/18/22   Fritzi Mandes, MD    Physical Exam: Vitals:   04/16/22 2979  04/16/22 0655 04/16/22 1102 04/16/22 1248  BP: 105/90  101/72 97/68  Pulse: (!) 55  (!) 59 62  Resp: '18  16 16  '$ Temp: 97.8 F (36.6 C)     TempSrc: Oral     SpO2: 99%  99% 99%  Weight:  84.8 kg    Height:  '6\' 1"'$  (1.854 m)     General: Not in acute distress HEENT:       Eyes: PERRL, EOMI, no scleral icterus.       ENT: No discharge from the ears and nose, no pharynx injection, no tonsillar enlargement.        Neck: No JVD, no bruit, no mass felt. Heme: No neck lymph node enlargement. Cardiac: S1/S2, RRR, No murmurs, No gallops or rubs. Respiratory: No rales, wheezing, rhonchi or rubs. GI: Soft, nondistended, nontender, no rebound pain, no organomegaly, BS present. GU: No hematuria Ext: No pitting leg edema bilaterally. 1+DP/PT pulse bilaterally. Musculoskeletal: No joint deformities, No joint redness or warmth, no limitation of ROM in spin. Skin: No rashes.  Neuro: Alert, orientated to place and  person, confused about year, oriented X3, cranial nerves II-XII grossly intact, moves all extremities normally.  Psych: Patient is not psychotic, no suicidal or hemocidal ideation.  Labs on Admission: I have personally reviewed following labs and imaging studies  CBC: Recent Labs  Lab 04/16/22 0920  WBC 7.9  NEUTROABS 6.2  HGB 15.5  HCT 45.2  MCV 90.8  PLT 86*   Basic Metabolic Panel: Recent Labs  Lab 04/16/22 0920  NA 139  K 4.5  CL 106  CO2 29  GLUCOSE 99  BUN 17  CREATININE 1.50*  CALCIUM 9.6   GFR: Estimated Creatinine Clearance: 40 mL/min (A) (by C-G formula based on SCr of 1.5 mg/dL (H)). Liver Function Tests: No results for input(s): AST, ALT, ALKPHOS, BILITOT, PROT, ALBUMIN in the last 168 hours. No results for input(s): LIPASE, AMYLASE in the last 168 hours. No results for input(s): AMMONIA in the last 168 hours. Coagulation Profile: No results for input(s): INR, PROTIME in the last 168 hours. Cardiac Enzymes: Recent Labs  Lab 04/16/22 1100  CKTOTAL 52   BNP (last 3 results) No results for input(s): PROBNP in the last 8760 hours. HbA1C: No results for input(s): HGBA1C in the last 72 hours. CBG: No results for input(s): GLUCAP in the last 168 hours. Lipid Profile: No results for input(s): CHOL, HDL, LDLCALC, TRIG, CHOLHDL, LDLDIRECT in the last 72 hours. Thyroid Function Tests: No results for input(s): TSH, T4TOTAL, FREET4, T3FREE, THYROIDAB in the last 72 hours. Anemia Panel: No results for input(s): VITAMINB12, FOLATE, FERRITIN, TIBC, IRON, RETICCTPCT in the last 72 hours. Urine analysis:    Component Value Date/Time   COLORURINE YELLOW (A) 03/16/2022 2250   APPEARANCEUR CLEAR (A) 03/16/2022 2250   LABSPEC 1.025 03/16/2022 2250   PHURINE 5.0 03/16/2022 2250   GLUCOSEU NEGATIVE 03/16/2022 2250   HGBUR SMALL (A) 03/16/2022 2250   BILIRUBINUR NEGATIVE 03/16/2022 2250   BILIRUBINUR 1+ 06/05/2021 1635   KETONESUR NEGATIVE 03/16/2022 2250    PROTEINUR 30 (A) 03/16/2022 2250   UROBILINOGEN 0.2 06/05/2021 1635   NITRITE NEGATIVE 03/16/2022 2250   LEUKOCYTESUR NEGATIVE 03/16/2022 2250   Sepsis Labs: '@LABRCNTIP'$ (procalcitonin:4,lacticidven:4) )No results found for this or any previous visit (from the past 240 hour(s)).   Radiological Exams on Admission: CT Head Wo Contrast  Result Date: 04/16/2022 CLINICAL DATA:  Head trauma.  Fell while getting out of bed. EXAM: CT HEAD WITHOUT  CONTRAST CT CERVICAL SPINE WITHOUT CONTRAST TECHNIQUE: Multidetector CT imaging of the head and cervical spine was performed following the standard protocol without intravenous contrast. Multiplanar CT image reconstructions of the cervical spine were also generated. RADIATION DOSE REDUCTION: This exam was performed according to the departmental dose-optimization program which includes automated exposure control, adjustment of the mA and/or kV according to patient size and/or use of iterative reconstruction technique. COMPARISON:  CT head 03/17/2022 FINDINGS: CT HEAD FINDINGS Brain: No evidence of acute infarction, hemorrhage, hydrocephalus, extra-axial collection or mass lesion/mass effect. Moderate severity cerebral and cerebellar atrophy with increase extra-axial spaces and ventricular dilatation. Unchanged. There is mild diffuse low-attenuation within the periventricular white matter compatible with chronic microvascular disease. Vascular: No hyperdense vessel or unexpected calcification. Skull: Normal. Negative for fracture or focal lesion. Sinuses/Orbits: No acute finding. Other: None. CT CERVICAL SPINE FINDINGS Alignment: No acute posttraumatic malalignment of the cervical spine. Skull base and vertebrae: No acute fracture. No primary bone lesion or focal pathologic process. Soft tissues and spinal canal: No prevertebral fluid or swelling. No visible canal hematoma. Disc levels: There is marked disc space narrowing and endplate spurring identified at C6-7. Upper  chest: Negative. Other: None IMPRESSION: 1. No acute intracranial abnormality. 2. Chronic small vessel ischemic disease and cerebral atrophy. 3. No evidence for cervical spine fracture or subluxation. 4. Cervical degenerative disc disease. Electronically Signed   By: Kerby Moors M.D.   On: 04/16/2022 07:56   CT Cervical Spine Wo Contrast  Result Date: 04/16/2022 CLINICAL DATA:  Head trauma.  Fell while getting out of bed. EXAM: CT HEAD WITHOUT CONTRAST CT CERVICAL SPINE WITHOUT CONTRAST TECHNIQUE: Multidetector CT imaging of the head and cervical spine was performed following the standard protocol without intravenous contrast. Multiplanar CT image reconstructions of the cervical spine were also generated. RADIATION DOSE REDUCTION: This exam was performed according to the departmental dose-optimization program which includes automated exposure control, adjustment of the mA and/or kV according to patient size and/or use of iterative reconstruction technique. COMPARISON:  CT head 03/17/2022 FINDINGS: CT HEAD FINDINGS Brain: No evidence of acute infarction, hemorrhage, hydrocephalus, extra-axial collection or mass lesion/mass effect. Moderate severity cerebral and cerebellar atrophy with increase extra-axial spaces and ventricular dilatation. Unchanged. There is mild diffuse low-attenuation within the periventricular white matter compatible with chronic microvascular disease. Vascular: No hyperdense vessel or unexpected calcification. Skull: Normal. Negative for fracture or focal lesion. Sinuses/Orbits: No acute finding. Other: None. CT CERVICAL SPINE FINDINGS Alignment: No acute posttraumatic malalignment of the cervical spine. Skull base and vertebrae: No acute fracture. No primary bone lesion or focal pathologic process. Soft tissues and spinal canal: No prevertebral fluid or swelling. No visible canal hematoma. Disc levels: There is marked disc space narrowing and endplate spurring identified at C6-7. Upper  chest: Negative. Other: None IMPRESSION: 1. No acute intracranial abnormality. 2. Chronic small vessel ischemic disease and cerebral atrophy. 3. No evidence for cervical spine fracture or subluxation. 4. Cervical degenerative disc disease. Electronically Signed   By: Kerby Moors M.D.   On: 04/16/2022 07:56      Assessment/Plan Principal Problem:   Syncope Active Problems:   Orthostatic hypotension   Paroxysmal atrial flutter (HCC)   Dyslipidemia   Thrombocytopenia (HCC)   BPH (benign prostatic hyperplasia)   Dementia, senile with depression ([place])   Chronic kidney disease, stage 3a (HCC)    Assessment and Plan: * Syncope Patient has recurrent syncope and fall.  Images negative for acute injury.  Etiology is not completely clear.  The differential diagnosis is broad, including vasovagal syncope, cardiac arrhythmia and orthostatic status.  No focal neurologic deficit on physical examination, low suspicions for TIA or stroke.  Patient does not have any chest pain, low suspicion for ACS.  EKG is abnormal with ventricular bigeminy and bifascicular block.  Patient is currently wearing loop recorder and monitored by cardiologist.  High suspicious for cardiac etiology. Wiill consult Dr. Saunders Revel of cardiology   - Place on progressive unit for obs - Orthostatic vital signs  - Neuro checks  - IVF: Received 1 L LR - PT/OT eval and treat -Patient is on midodrine for history of orthostatic hypotension  Orthostatic hypotension - Continue midodrine 5 mg 3 times daily  Paroxysmal atrial flutter (HCC) - Continue Eliquis.  Patient has recurrent syncope and fall, high risk of injury.  Eliquis may need to be discontinued.  I will defer this to cardiologist for final decision.  Chronic kidney disease, stage 3a ([place]) Slightly worse than baseline.  Recent baseline creatinine 1.2-1.4.  Her creatinine is 1.50, BUN 17. -Follow-up with BMP -IV fluid as above  Dementia, senile with depression (HCC) -  Prozac, Namenda  BPH (benign prostatic hyperplasia) - Continue finasteride  Thrombocytopenia ([place]) This is a chronic issue.  Platelet 86 (68 on 03/18/2022).  Unclear etiology -Follow-up with CBC  Dyslipidemia Not taking medications currently. -Follow-up with PCP          DVT ppx: SQ Lovenox  Code Status: DNR per his wife  Family Communication:  Yes, patient's   wife at bed side.        Disposition Plan:  Anticipate discharge back to previous environment  Consults called:  Dr. Saunders Revel of card  Admission status and Level of care: Telemetry Medical:    for obs         Severity of Illness:  The appropriate patient status for this patient is OBSERVATION. Observation status is judged to be reasonable and necessary in order to provide the required intensity of service to ensure the patient's safety. The patient's presenting symptoms, physical exam findings, and initial radiographic and laboratory data in the context of their medical condition is felt to place them at decreased risk for further clinical deterioration. Furthermore, it is anticipated that the patient will be medically stable for discharge from the hospital within 2 midnights of admission.        Date of Service 04/16/2022    Ivor Costa [facility]   If 7PM-7AM, please contact night-coverage [email or web address] 04/16/2022, 2:23 PM

## 2022-04-16 NOTE — Assessment & Plan Note (Signed)
This is a chronic issue.  Platelet 86 (68 on 03/18/2022).  Unclear etiology -Follow-up with CBC

## 2022-04-16 NOTE — Assessment & Plan Note (Signed)
-   Continue finasteride 

## 2022-04-16 NOTE — Progress Notes (Signed)
[facility]) Hospital Liaison note:  This patient is currently enrolled in [facility] outpatient-based Palliative Care. Will continue to follow for disposition.  Please call with any outpatient palliative questions or concerns.  Thank you, Lorelee Market, LPN [facility] Liaison [phone]

## 2022-04-16 NOTE — ED Notes (Signed)
See triage note  presents from home via EMS   per family he fell this am getting out of bed   does have skin tear to left knee  per wife he "passed out'

## 2022-04-16 NOTE — Assessment & Plan Note (Signed)
Slightly worse than baseline.  Recent baseline creatinine 1.2-1.4.  Her creatinine is 1.50, BUN 17. -Follow-up with BMP -IV fluid as above

## 2022-04-16 NOTE — Assessment & Plan Note (Addendum)
-   Continue midodrine 5 mg 3 times daily

## 2022-04-16 NOTE — Assessment & Plan Note (Signed)
-   Continue Eliquis.  Patient has recurrent syncope and fall, high risk of injury.  Eliquis may need to be discontinued.  I will defer this to cardiologist for final decision.

## 2022-04-16 NOTE — Assessment & Plan Note (Signed)
Patient has recurrent syncope and fall.  Images negative for acute injury.  Etiology is not completely clear. The differential diagnosis is broad, including vasovagal syncope, cardiac arrhythmia and orthostatic status.  No focal neurologic deficit on physical examination, low suspicions for TIA or stroke.  Patient does not have any chest pain, low suspicion for ACS.  EKG is abnormal with ventricular bigeminy and bifascicular block.  Patient is currently wearing loop recorder and monitored by cardiologist.  High suspicious for cardiac etiology. Wiill consult Dr. Saunders Revel of cardiology   - Place on progressive unit for obs - Orthostatic vital signs  - Neuro checks  - IVF: Received 1 L LR - PT/OT eval and treat -Patient is on midodrine for history of orthostatic hypotension

## 2022-04-16 NOTE — Assessment & Plan Note (Signed)
-  Not taking medications currently °-Follow-up with PCP °

## 2022-04-16 NOTE — Assessment & Plan Note (Signed)
-   Prozac, Namenda

## 2022-04-17 ENCOUNTER — Telehealth: Payer: Self-pay | Admitting: *Deleted

## 2022-04-17 DIAGNOSIS — Z9181 History of falling: Secondary | ICD-10-CM | POA: Diagnosis not present

## 2022-04-17 DIAGNOSIS — I951 Orthostatic hypotension: Secondary | ICD-10-CM | POA: Diagnosis present

## 2022-04-17 DIAGNOSIS — R55 Syncope and collapse: Secondary | ICD-10-CM | POA: Diagnosis not present

## 2022-04-17 DIAGNOSIS — F0393 Unspecified dementia, unspecified severity, with mood disturbance: Secondary | ICD-10-CM | POA: Diagnosis present

## 2022-04-17 DIAGNOSIS — N400 Enlarged prostate without lower urinary tract symptoms: Secondary | ICD-10-CM | POA: Diagnosis present

## 2022-04-17 DIAGNOSIS — I453 Trifascicular block: Secondary | ICD-10-CM | POA: Diagnosis present

## 2022-04-17 DIAGNOSIS — N529 Male erectile dysfunction, unspecified: Secondary | ICD-10-CM | POA: Diagnosis present

## 2022-04-17 DIAGNOSIS — R008 Other abnormalities of heart beat: Secondary | ICD-10-CM | POA: Diagnosis present

## 2022-04-17 DIAGNOSIS — N1831 Chronic kidney disease, stage 3a: Secondary | ICD-10-CM | POA: Diagnosis present

## 2022-04-17 DIAGNOSIS — E860 Dehydration: Secondary | ICD-10-CM | POA: Diagnosis present

## 2022-04-17 DIAGNOSIS — E559 Vitamin D deficiency, unspecified: Secondary | ICD-10-CM | POA: Diagnosis present

## 2022-04-17 DIAGNOSIS — I480 Paroxysmal atrial fibrillation: Secondary | ICD-10-CM | POA: Diagnosis present

## 2022-04-17 DIAGNOSIS — I4892 Unspecified atrial flutter: Secondary | ICD-10-CM | POA: Diagnosis not present

## 2022-04-17 DIAGNOSIS — Z85828 Personal history of other malignant neoplasm of skin: Secondary | ICD-10-CM | POA: Diagnosis not present

## 2022-04-17 DIAGNOSIS — I739 Peripheral vascular disease, unspecified: Secondary | ICD-10-CM | POA: Diagnosis present

## 2022-04-17 DIAGNOSIS — E538 Deficiency of other specified B group vitamins: Secondary | ICD-10-CM | POA: Diagnosis present

## 2022-04-17 DIAGNOSIS — N179 Acute kidney failure, unspecified: Secondary | ICD-10-CM | POA: Diagnosis present

## 2022-04-17 DIAGNOSIS — H9190 Unspecified hearing loss, unspecified ear: Secondary | ICD-10-CM | POA: Diagnosis present

## 2022-04-17 DIAGNOSIS — W2209XA Striking against other stationary object, initial encounter: Secondary | ICD-10-CM | POA: Diagnosis present

## 2022-04-17 DIAGNOSIS — Z8619 Personal history of other infectious and parasitic diseases: Secondary | ICD-10-CM | POA: Diagnosis not present

## 2022-04-17 DIAGNOSIS — R531 Weakness: Secondary | ICD-10-CM | POA: Diagnosis not present

## 2022-04-17 DIAGNOSIS — W1839XA Other fall on same level, initial encounter: Secondary | ICD-10-CM | POA: Diagnosis present

## 2022-04-17 DIAGNOSIS — Z7901 Long term (current) use of anticoagulants: Secondary | ICD-10-CM | POA: Diagnosis not present

## 2022-04-17 DIAGNOSIS — Z87891 Personal history of nicotine dependence: Secondary | ICD-10-CM | POA: Diagnosis not present

## 2022-04-17 DIAGNOSIS — E785 Hyperlipidemia, unspecified: Secondary | ICD-10-CM | POA: Diagnosis present

## 2022-04-17 DIAGNOSIS — Z66 Do not resuscitate: Secondary | ICD-10-CM | POA: Diagnosis present

## 2022-04-17 DIAGNOSIS — D696 Thrombocytopenia, unspecified: Secondary | ICD-10-CM | POA: Diagnosis present

## 2022-04-17 DIAGNOSIS — Z79899 Other long term (current) drug therapy: Secondary | ICD-10-CM | POA: Diagnosis not present

## 2022-04-17 DIAGNOSIS — Y92002 Bathroom of unspecified non-institutional (private) residence single-family (private) house as the place of occurrence of the external cause: Secondary | ICD-10-CM | POA: Diagnosis not present

## 2022-04-17 LAB — BASIC METABOLIC PANEL
Anion gap: 5 (ref 5–15)
BUN: 14 mg/dL (ref 8–23)
CO2: 28 mmol/L (ref 22–32)
Calcium: 9.3 mg/dL (ref 8.9–10.3)
Chloride: 106 mmol/L (ref 98–111)
Creatinine, Ser: 1.22 mg/dL (ref 0.61–1.24)
GFR, Estimated: 58 mL/min — ABNORMAL LOW (ref >60)
Glucose, Bld: 90 mg/dL (ref 70–99)
Potassium: 4.1 mmol/L (ref 3.5–5.1)
Sodium: 139 mmol/L (ref 135–145)

## 2022-04-17 MED ORDER — SODIUM CHLORIDE 0.9 % IV SOLN: INTRAVENOUS | Status: DC

## 2022-04-17 NOTE — Progress Notes (Signed)
PROGRESS NOTE    Dylan [name]  YJE:[id number] DOB: Feb 01, 1934 DOA: 04/16/2022 PCP: [name], MD   Brief Narrative: This 88 years old male with PMH significant for dementia, hyperlipidemia, atrial flutter on Eliquis, CKD stage IIIa, BPH, orthostatic hypotension, thrombocytopenia, PAD, history of C. difficile, hard of hearing presented in the ED with recurrent syncope. Patient has history of syncope currently being monitored by cardiologist.  Patient has a loop recorder placed.  Patient had 2 episodes of syncope yesterday morning.  While syncope patient fell and injured his head.  Patient does not have any numbness, weakness no facial droop or slurred speech.  Patient also denies chest pain or shortness of breath.  CT head negative, CT C-spine negative for any acute injury.  EKG shows bifascicular block ventricular bigeminy.  Cardiology is consulted thinks the syncope is multifactorial.  Assessment & Plan:   Principal Problem:   Syncope Active Problems:   Orthostatic hypotension   Paroxysmal atrial flutter (HCC)   Dyslipidemia   Thrombocytopenia (HCC)   BPH (benign prostatic hyperplasia)   Dementia, senile with depression ([place])   Chronic kidney disease, stage 3a ([place])  Syncope: Patient presented with recurrent syncope and fall.  Imaging negative for acute injury. Etiology is not clear.  It could be multifactorial due to vasovagal syncope, cardiac arrhythmias and orthostatic hypotension.  There is no neurofocal deficit on physical exam.  TIA or stroke is unlikely.  Patient denies any chest pain so there is low suspicion for ACS. EKG shows ventricular bigeminy and bifascicular block. Patient is wearing loop recorder,  is monitored by cardiologist. Cardiology is consulted,  recommended to increase fluid intake,  continue midodrine. Recommended to hold Eliquis for now given more risks than benefits. Resumption of anticoagulation can be addressed at primary care cardiologist  visit.  Orthostatic hypotension: Continue midodrine, continue gentle IV hydration.  Paroxysmal atrial fibrillation: Hold Eliquis.  Heart rate is controlled. Patient has recurrent syncope and falls,  there is high risk of injury. Resumption of anticoagulation addressed at primary care cardiologist visit.  AKI on CKD stage IIIa: Baseline serum creatinine 1.2, presented with creatinine 1.5 on admission Renal functions back to baseline.  Avoid nephrotoxic medications.  Dementia / Depression: Continue Namenda and Prozac.  BPH: Continue finasteride 5 mg daily..  Thrombocytopenia: Patient does have chronic thrombocytopenia, Baseline platelet count 86.  Unclear etiology.   Dyslipidemia Patient not taking any anticholesterol medications   DVT prophylaxis:  SCDs Code Status: DNR Family Communication: Wife at bed side. Disposition Plan:   Status is: Observation The patient remains OBS appropriate and will d/c before 2 midnights.  Admitted for recurrent syncope,  EKG shows ventricular bigeminy and trifascicular block. Cardiology is consulted,  work-up is in progress. Anticipated discharge in 1 to 2 days.   Consultants:  Cardiology  Procedures: Echocardiogram  Antimicrobials: None.  Subjective: Patient was seen and examined at bedside.  Overnight events noted. Patient is alert, stable, denies any complaints.  Patient does have dementia. Patient is alert and oriented x 2.   Objective: Vitals:   04/17/22 0348 04/17/22 0832 04/17/22 0835 04/17/22 0838  BP: 113/60 115/61 111/62 119/70  Pulse: (!) 49 62 62 70  Resp: 15 16    Temp: 98.4 F (36.9 C) (!) 97.5 F (36.4 C)    TempSrc:      SpO2: 99% 97%  100%  Weight:      Height:        Intake/Output Summary (Last 24 hours) at 04/17/2022 1134 Last  data filed at 04/17/2022 1016 Gross per 24 hour  Intake 360 ml  Output --  Net 360 ml   Filed Weights   04/16/22 0655  Weight: 84.8 kg    Examination:  General exam:  Appears comfortable, not in any acute distress.  Deconditioned Respiratory system: CTA bilaterally, no wheezing, no crackles, normal respiratory effort. Cardiovascular system: S1-S2 heard, regular rate and rhythm, no murmur. Gastrointestinal system: Abdomen is soft, non tender, non distended, BS+ Central nervous system: Alert and oriented x 2 . No focal neurological deficits. Extremities: No edema, no cyanosis, no clubbing. Skin: No rashes, lesions or ulcers Psychiatry: Judgement and insight appear normal. Mood & affect appropriate.     Data Reviewed: I have personally reviewed following labs and imaging studies  CBC: Recent Labs  Lab 04/16/22 0920  WBC 7.9  NEUTROABS 6.2  HGB 15.5  HCT 45.2  MCV 90.8  PLT 86*   Basic Metabolic Panel: Recent Labs  Lab 04/16/22 0920 04/17/22 0732  NA 139 139  K 4.5 4.1  CL 106 106  CO2 29 28  GLUCOSE 99 90  BUN 17 14  CREATININE 1.50* 1.22  CALCIUM 9.6 9.3   GFR: Estimated Creatinine Clearance: 49.1 mL/min (by C-G formula based on SCr of 1.22 mg/dL). Liver Function Tests: No results for input(s): AST, ALT, ALKPHOS, BILITOT, PROT, ALBUMIN in the last 168 hours. No results for input(s): LIPASE, AMYLASE in the last 168 hours. No results for input(s): AMMONIA in the last 168 hours. Coagulation Profile: No results for input(s): INR, PROTIME in the last 168 hours. Cardiac Enzymes: Recent Labs  Lab 04/16/22 1100  CKTOTAL 52   BNP (last 3 results) No results for input(s): PROBNP in the last 8760 hours. HbA1C: No results for input(s): HGBA1C in the last 72 hours. CBG: No results for input(s): GLUCAP in the last 168 hours. Lipid Profile: No results for input(s): CHOL, HDL, LDLCALC, TRIG, CHOLHDL, LDLDIRECT in the last 72 hours. Thyroid Function Tests: No results for input(s): TSH, T4TOTAL, FREET4, T3FREE, THYROIDAB in the last 72 hours. Anemia Panel: No results for input(s): VITAMINB12, FOLATE, FERRITIN, TIBC, IRON, RETICCTPCT in  the last 72 hours. Sepsis Labs: No results for input(s): PROCALCITON, LATICACIDVEN in the last 168 hours.  No results found for this or any previous visit (from the past 240 hour(s)).   Radiology Studies: CT Head Wo Contrast  Result Date: 04/16/2022 CLINICAL DATA:  Head trauma.  Fell while getting out of bed. EXAM: CT HEAD WITHOUT CONTRAST CT CERVICAL SPINE WITHOUT CONTRAST TECHNIQUE: Multidetector CT imaging of the head and cervical spine was performed following the standard protocol without intravenous contrast. Multiplanar CT image reconstructions of the cervical spine were also generated. RADIATION DOSE REDUCTION: This exam was performed according to the departmental dose-optimization program which includes automated exposure control, adjustment of the mA and/or kV according to patient size and/or use of iterative reconstruction technique. COMPARISON:  CT head 03/17/2022 FINDINGS: CT HEAD FINDINGS Brain: No evidence of acute infarction, hemorrhage, hydrocephalus, extra-axial collection or mass lesion/mass effect. Moderate severity cerebral and cerebellar atrophy with increase extra-axial spaces and ventricular dilatation. Unchanged. There is mild diffuse low-attenuation within the periventricular white matter compatible with chronic microvascular disease. Vascular: No hyperdense vessel or unexpected calcification. Skull: Normal. Negative for fracture or focal lesion. Sinuses/Orbits: No acute finding. Other: None. CT CERVICAL SPINE FINDINGS Alignment: No acute posttraumatic malalignment of the cervical spine. Skull base and vertebrae: No acute fracture. No primary bone lesion or focal pathologic process. Soft  tissues and spinal canal: No prevertebral fluid or swelling. No visible canal hematoma. Disc levels: There is marked disc space narrowing and endplate spurring identified at C6-7. Upper chest: Negative. Other: None IMPRESSION: 1. No acute intracranial abnormality. 2. Chronic small vessel ischemic  disease and cerebral atrophy. 3. No evidence for cervical spine fracture or subluxation. 4. Cervical degenerative disc disease. Electronically Signed   By: Kerby Moors M.D.   On: 04/16/2022 07:56   CT Cervical Spine Wo Contrast  Result Date: 04/16/2022 CLINICAL DATA:  Head trauma.  Fell while getting out of bed. EXAM: CT HEAD WITHOUT CONTRAST CT CERVICAL SPINE WITHOUT CONTRAST TECHNIQUE: Multidetector CT imaging of the head and cervical spine was performed following the standard protocol without intravenous contrast. Multiplanar CT image reconstructions of the cervical spine were also generated. RADIATION DOSE REDUCTION: This exam was performed according to the departmental dose-optimization program which includes automated exposure control, adjustment of the mA and/or kV according to patient size and/or use of iterative reconstruction technique. COMPARISON:  CT head 03/17/2022 FINDINGS: CT HEAD FINDINGS Brain: No evidence of acute infarction, hemorrhage, hydrocephalus, extra-axial collection or mass lesion/mass effect. Moderate severity cerebral and cerebellar atrophy with increase extra-axial spaces and ventricular dilatation. Unchanged. There is mild diffuse low-attenuation within the periventricular white matter compatible with chronic microvascular disease. Vascular: No hyperdense vessel or unexpected calcification. Skull: Normal. Negative for fracture or focal lesion. Sinuses/Orbits: No acute finding. Other: None. CT CERVICAL SPINE FINDINGS Alignment: No acute posttraumatic malalignment of the cervical spine. Skull base and vertebrae: No acute fracture. No primary bone lesion or focal pathologic process. Soft tissues and spinal canal: No prevertebral fluid or swelling. No visible canal hematoma. Disc levels: There is marked disc space narrowing and endplate spurring identified at C6-7. Upper chest: Negative. Other: None IMPRESSION: 1. No acute intracranial abnormality. 2. Chronic small vessel ischemic  disease and cerebral atrophy. 3. No evidence for cervical spine fracture or subluxation. 4. Cervical degenerative disc disease. Electronically Signed   By: Kerby Moors M.D.   On: 04/16/2022 07:56     Scheduled Meds:  acidophilus  1 capsule Oral Daily   cholecalciferol  1,000 Units Oral QHS   finasteride  5 mg Oral Daily   FLUoxetine  20 mg Oral Daily   loratadine  10 mg Oral Daily   memantine  14 mg Oral QODAY   midodrine  5 mg Oral TID AC   vitamin B-12  500 mcg Oral QHS   Continuous Infusions:   LOS: 0 days    Time spent: 50 mins    Kemiya Batdorf, MD [facility]   If 7PM-7AM, please contact night-coverage

## 2022-04-17 NOTE — Progress Notes (Signed)
Cardiology Progress Note   Patient Name: Dylan [name] Date of Encounter: 04/17/2022  Primary Cardiologist: Quay Burow, MD  Subjective   Patient seen on AM rounds. No family at the bedside. He currently denies chest pain or shortness of breath. States that he slept well overnight. Questions have to be repeated several times due to his baseline of dementia.   Inpatient Medications    Scheduled Meds:  acidophilus  1 capsule Oral Daily   cholecalciferol  1,000 Units Oral QHS   finasteride  5 mg Oral Daily   FLUoxetine  20 mg Oral Daily   loratadine  10 mg Oral Daily   memantine  14 mg Oral QODAY   midodrine  5 mg Oral TID AC   vitamin B-12  500 mcg Oral QHS   Continuous Infusions:  PRN Meds: acetaminophen, guaiFENesin-dextromethorphan, ondansetron (ZOFRAN) IV, senna   Vital Signs    Vitals:   04/16/22 1740 04/16/22 2104 04/16/22 2317 04/17/22 0348  BP: 136/73 114/64 109/63 113/60  Pulse: 65 (!) 48  (!) 49  Resp: '20 16 17 15  '$ Temp: 98.2 F (36.8 C) 97.9 F (36.6 C) 98.2 F (36.8 C) 98.4 F (36.9 C)  TempSrc:  Oral Oral   SpO2: 99% 97% 99% 99%  Weight:      Height:        Intake/Output Summary (Last 24 hours) at 04/17/2022 0751 Last data filed at 04/16/2022 1104 Gross per 24 hour  Intake 1000 ml  Output --  Net 1000 ml   Filed Weights   04/16/22 0655  Weight: 84.8 kg    Physical Exam   GEN: In no acute distress sitting upright in the bed HEENT: Grossly normal.  Neck: Supple, no JVD, carotid bruits, or masses. Cardiac: RRR, no murmurs, rubs, or gallops. No clubbing, cyanosis, edema.  Radials 2+, DP/PT 2+ and equal bilaterally.  Respiratory:  Respirations regular and unlabored, clear to auscultation bilaterally on room air. GI: Soft, nontender, nondistended, BS + x 4. MS: no deformity or atrophy. Skin: warm and dry, no rash. Neuro:  Strength and sensation are intact. Disoriented to place/time. Psych: Alert. Normal affect.  Labs    Chemistry Recent  Labs  Lab 04/16/22 0920  NA 139  K 4.5  CL 106  CO2 29  GLUCOSE 99  BUN 17  CREATININE 1.50*  CALCIUM 9.6  GFRNONAA 45*  ANIONGAP 4*     Hematology Recent Labs  Lab 04/16/22 0920  WBC 7.9  RBC 4.98  HGB 15.5  HCT 45.2  MCV 90.8  MCH 31.1  MCHC 34.3  RDW 13.2  PLT 86*    Cardiac Enzymes  Recent Labs  Lab 04/16/22 0920  TROPONINIHS 7      BNP    Component Value Date/Time   BNP 296.8 (H) 03/17/2022 0506    ProBNP No results found for: PROBNP   DDimer No results for input(s): DDIMER in the last 168 hours.   Lipids  Lab Results  Component Value Date   CHOL 210 (H) 08/20/2021   HDL 36.70 (L) 08/20/2021   LDLCALC 113 (H) 08/04/2019   LDLDIRECT 140.0 08/20/2021   TRIG 332.0 (H) 08/20/2021   CHOLHDL 6 08/20/2021    HbA1c No results found for: HGBA1C  Radiology    CT Head Wo Contrast  Result Date: 04/16/2022 CLINICAL DATA:  Head trauma.  Fell while getting out of bed. EXAM: CT HEAD WITHOUT CONTRAST CT CERVICAL SPINE WITHOUT CONTRAST TECHNIQUE: Multidetector CT imaging of the  head and cervical spine was performed following the standard protocol without intravenous contrast. Multiplanar CT image reconstructions of the cervical spine were also generated. RADIATION DOSE REDUCTION: This exam was performed according to the departmental dose-optimization program which includes automated exposure control, adjustment of the mA and/or kV according to patient size and/or use of iterative reconstruction technique. COMPARISON:  CT head 03/17/2022 FINDINGS: CT HEAD FINDINGS Brain: No evidence of acute infarction, hemorrhage, hydrocephalus, extra-axial collection or mass lesion/mass effect. Moderate severity cerebral and cerebellar atrophy with increase extra-axial spaces and ventricular dilatation. Unchanged. There is mild diffuse low-attenuation within the periventricular white matter compatible with chronic microvascular disease. Vascular: No hyperdense vessel or unexpected  calcification. Skull: Normal. Negative for fracture or focal lesion. Sinuses/Orbits: No acute finding. Other: None. CT CERVICAL SPINE FINDINGS Alignment: No acute posttraumatic malalignment of the cervical spine. Skull base and vertebrae: No acute fracture. No primary bone lesion or focal pathologic process. Soft tissues and spinal canal: No prevertebral fluid or swelling. No visible canal hematoma. Disc levels: There is marked disc space narrowing and endplate spurring identified at C6-7. Upper chest: Negative. Other: None IMPRESSION: 1. No acute intracranial abnormality. 2. Chronic small vessel ischemic disease and cerebral atrophy. 3. No evidence for cervical spine fracture or subluxation. 4. Cervical degenerative disc disease. Electronically Signed   By: Kerby Moors M.D.   On: 04/16/2022 07:56   CT Cervical Spine Wo Contrast  Result Date: 04/16/2022 CLINICAL DATA:  Head trauma.  Fell while getting out of bed. EXAM: CT HEAD WITHOUT CONTRAST CT CERVICAL SPINE WITHOUT CONTRAST TECHNIQUE: Multidetector CT imaging of the head and cervical spine was performed following the standard protocol without intravenous contrast. Multiplanar CT image reconstructions of the cervical spine were also generated. RADIATION DOSE REDUCTION: This exam was performed according to the departmental dose-optimization program which includes automated exposure control, adjustment of the mA and/or kV according to patient size and/or use of iterative reconstruction technique. COMPARISON:  CT head 03/17/2022 FINDINGS: CT HEAD FINDINGS Brain: No evidence of acute infarction, hemorrhage, hydrocephalus, extra-axial collection or mass lesion/mass effect. Moderate severity cerebral and cerebellar atrophy with increase extra-axial spaces and ventricular dilatation. Unchanged. There is mild diffuse low-attenuation within the periventricular white matter compatible with chronic microvascular disease. Vascular: No hyperdense vessel or unexpected  calcification. Skull: Normal. Negative for fracture or focal lesion. Sinuses/Orbits: No acute finding. Other: None. CT CERVICAL SPINE FINDINGS Alignment: No acute posttraumatic malalignment of the cervical spine. Skull base and vertebrae: No acute fracture. No primary bone lesion or focal pathologic process. Soft tissues and spinal canal: No prevertebral fluid or swelling. No visible canal hematoma. Disc levels: There is marked disc space narrowing and endplate spurring identified at C6-7. Upper chest: Negative. Other: None IMPRESSION: 1. No acute intracranial abnormality. 2. Chronic small vessel ischemic disease and cerebral atrophy. 3. No evidence for cervical spine fracture or subluxation. 4. Cervical degenerative disc disease. Electronically Signed   By: Kerby Moors M.D.   On: 04/16/2022 07:56    Telemetry    SR with PVC's - Personally Reviewed  ECG    No new tracings- Personally Reviewed  Cardiac Studies   Patient Profile     88 y.o. male with a history of recurrent syncope, orthostatic hypotension, PAD, RBBB, CKD III, and baseline of dementia, who was evaluated for syncope.   Assessment & Plan    Syncope with collapse - Head/cervical CT with no abnormal findings -Linq interrogation completed on admission with no finding supportive of syncope -no clear  cause likely multifactorial with long standing history of orthostasis, dehydration, vasovagal, and reported diarrhea  2. Orthostatic hypotension -Continue midodrine -Orthostatic vital signs daily -recommended increasing fluid intake  3. CKD III -creatinine 1.22 -on admission creatinine was 1.50, likely some dehydration -recommend increasing fluid intake   4. Paroxysmal atrial fibrillation  -linq interrogation revealed 12.5% AT/AF burden -with continuation of falls would remain off of eliquis until follow up with primary cardiologist -concern that risks may outweigh the benefit of anticoagulation at this time -currently in  sinus   Signed, Torah Pinnock, NP  04/17/2022, 7:51 AM    For questions or updates, please contact   Please consult [email or web address] for contact info under Cardiology/STEMI.

## 2022-04-17 NOTE — Telephone Encounter (Signed)
-----   Message from [place], PA-C sent at 04/17/2022  4:14 PM EDT ----- Regarding: hosp f/u PT needs hospital follow-up in 3-4 weeks with Dr. [name], his primary cardiologist.

## 2022-04-17 NOTE — Telephone Encounter (Signed)
-----   Message from [place], PA-C sent at 04/17/2022  4:14 PM EDT ----- Regarding: hosp f/u PT needs hospital follow-up in 3-4 weeks with Dr. [name], his primary cardiologist.

## 2022-04-17 NOTE — Evaluation (Addendum)
Physical Therapy Evaluation Patient Details Name: Dylan [name] MRN: [id number] DOB: 09/02/1934 Today's Date: 04/17/2022  History of Present Illness  Pt is an 88 yo male that presented to the ED for syncopal episodes. PMH of afib, CKD, dementia, orthostatic hypotension, s/p ILR.   Clinical Impression  Pt alert throughout session, speaks minimally to PT. Pt impulsive with mobility and a little unsteady, but does redirect and follow one step commands with time and repetition. Per family (and pt as able), pt lives with wife who is available 24/7, ambulatory with AD (but has been told to use a RW), 5 falls in the last month. Currently needs set up cues/sequencing cues for ADLs, some physical assist for ADLs and mobility (for safety) at baseline.  The patient was able to perform bed mobility with supervision. Sit <> stand several times with minA for steadying and intermittently for RW stabilization. He was able to ambulate ~31f with RW and constant assistance (minA) to attend to task, RW management, and to re-direct as needed. Pt also noted for incontinence and cleaned up as able. Pt able to don socks with cueing. Pt also assessed for orthostatics and vitals placed in chart, pt noted for low BP, but did increase after a few minutes in standing. Overall the patient demonstrated decreased safety awareness, balance deficits in need for constant cognitive and some physical assistance for all mobility. Recommendation at this time is SNF due to recent hospitalizations, recent falls (5),  to maximize safety and function.        Recommendations for follow up therapy are one component of a multi-disciplinary discharge planning process, led by the attending physician.  Recommendations may be updated based on patient status, additional functional criteria and insurance authorization.  Follow Up Recommendations Skilled nursing-short term rehab (<3 hours/day)    Assistance Recommended at Discharge Frequent or  constant Supervision/Assistance  Patient can return home with the following  A lot of help with walking and/or transfers;A lot of help with bathing/dressing/bathroom;Assistance with cooking/housework;Assist for transportation;Help with stairs or ramp for entrance;Direct supervision/assist for medications management    Equipment Recommendations Other (comment) (TBD at next venue of care)  Recommendations for Other Services       Functional Status Assessment Patient has had a recent decline in their functional status and/or demonstrates limited ability to make significant improvements in function in a reasonable and predictable amount of time.     Precautions / Restrictions Precautions Precautions: Fall Restrictions Weight Bearing Restrictions: No      Mobility  Bed Mobility Overal bed mobility: Needs Assistance Bed Mobility: Supine to Sit, Sit to Supine     Supine to sit: Supervision, HOB elevated Sit to supine: Supervision, HOB elevated        Transfers Overall transfer level: Needs assistance Equipment used: Rolling walker (2 wheels) Transfers: Sit to/from Stand Sit to Stand: Min assist           General transfer comment: minA for RW stabilizing or pt stabilization    Ambulation/Gait Ambulation/Gait assistance: Min guard Gait Distance (Feet): 45 Feet Assistive device: Rolling walker (2 wheels)         General Gait Details: pt needed constant redirection, cueing, RW management, no LOB noted  Stairs            Wheelchair Mobility    Modified Rankin (Stroke Patients Only)       Balance Overall balance assessment: Needs assistance Sitting-balance support: Feet supported Sitting balance-Leahy Scale: Good  Standing balance-Leahy Scale: Good                               Pertinent Vitals/Pain Pain Assessment Pain Assessment: Faces Faces Pain Scale: No hurt    Home Living Family/patient expects to be discharged to::  Private residence Living Arrangements: Spouse/significant other Available Help at Discharge: Family;Available 24 hours/day Type of Home: House Home Access: Stairs to enter Entrance Stairs-Rails: Can reach both;Left;Right Entrance Stairs-Number of Steps: 3   Home Layout: One level Home Equipment: Cane - single point      Prior Function Prior Level of Function : Needs assist  Cognitive Assist : Mobility (cognitive);ADLs (cognitive) Mobility (Cognitive): Step by step cues ADLs (Cognitive): Step by step cues Physical Assist : Mobility (physical);ADLs (physical) Mobility (physical): Bed mobility;Transfers;Gait;Stairs ADLs (physical): Feeding;Grooming;Bathing;Dressing;Toileting         Hand Dominance   Dominant Hand: Right    Extremity/Trunk Assessment   Upper Extremity Assessment Upper Extremity Assessment: Overall WFL for tasks assessed    Lower Extremity Assessment Lower Extremity Assessment: Overall WFL for tasks assessed    Cervical / Trunk Assessment Cervical / Trunk Assessment: Normal  Communication   Communication: HOH  Cognition Arousal/Alertness: Awake/alert Behavior During Therapy: Impulsive Overall Cognitive Status: History of cognitive impairments - at baseline                                 General Comments: able to follow commands, some repetition and cues for facilitation or to slow pts impulsivity noted        General Comments      Exercises     Assessment/Plan    PT Assessment Patient needs continued PT services  PT Problem List Decreased strength;Decreased mobility;Decreased activity tolerance;Decreased balance;Pain;Decreased knowledge of use of DME;Decreased knowledge of precautions;Decreased range of motion       PT Treatment Interventions DME instruction;Therapeutic exercise;Gait training;Balance training;Stair training;Neuromuscular re-education;Therapeutic activities;Patient/family education;Functional mobility training     PT Goals (Current goals can be found in the Care Plan section)  Acute Rehab PT Goals Patient Stated Goal: to be safe at discharge PT Goal Formulation: With family Time For Goal Achievement: 05/01/22 Potential to Achieve Goals: Fair    Frequency Min 2X/week (PT trial to further assess pt's ability to participate with therapy)     Co-evaluation               AM-PAC PT "6 Clicks" Mobility  Outcome Measure Help needed turning from your back to your side while in a flat bed without using bedrails?: A Little Help needed moving from lying on your back to sitting on the side of a flat bed without using bedrails?: A Little Help needed moving to and from a bed to a chair (including a wheelchair)?: A Little Help needed standing up from a chair using your arms (e.g., wheelchair or bedside chair)?: A Little Help needed to walk in hospital room?: A Little Help needed climbing 3-5 steps with a railing? : A Little 6 Click Score: 18    End of Session Equipment Utilized During Treatment: Gait belt Activity Tolerance: Patient tolerated treatment well Patient left: in bed;with bed alarm set;with call bell/phone within reach Nurse Communication: Mobility status PT Visit Diagnosis: Other abnormalities of gait and mobility (R26.89);Difficulty in walking, not elsewhere classified (R26.2);Muscle weakness (generalized) (M62.81)    Time: 2542-7062 PT Time Calculation (min) (ACUTE  ONLY): 34 min   Charges:   PT Evaluation $PT Eval Low Complexity: 1 Low PT Treatments $Therapeutic Activity: 23-37 mins        Lieutenant Diego PT, DPT 4:31 PM,04/17/22

## 2022-04-18 ENCOUNTER — Ambulatory Visit (INDEPENDENT_AMBULATORY_CARE_PROVIDER_SITE_OTHER): Payer: Medicare Other

## 2022-04-18 DIAGNOSIS — R55 Syncope and collapse: Secondary | ICD-10-CM | POA: Diagnosis not present

## 2022-04-18 NOTE — TOC Progression Note (Signed)
Transition of Care [facility]) - Progression Note    Patient Details  Name: Dylan [name] MRN: [id number] Date of Birth: May 05, 1934  Transition of Care [facility]) CM/SW [place], LCSW Phone Number: 04/18/2022, 2:10 PM  Clinical Narrative:   CSW notified pt's spouse that [facility] declined. Pt's spouse is agreeable to fax out to other [place] facilities. CSW has sent referral. Will provide bed offers once available.     Expected Discharge Plan: [place] Barriers to Discharge: Continued Medical Work up  Expected Discharge Plan and Services Expected Discharge Plan: [place] In-house Referral: NA   Post Acute Care Choice: [place] Living arrangements for the past 2 months: Single Family Home                                       Social Determinants of Health (SDOH) Interventions    Readmission Risk Interventions     No data to display

## 2022-04-18 NOTE — TOC Initial Note (Signed)
Transition of Care [facility]) - Initial/Assessment Note    Patient Details  Name: Dylan [name] MRN: [id number] Date of Birth: Mar 08, 1934  Transition of Care [facility]) CM/SW Contact:    Eileen Stanford, LCSW Phone Number: 04/18/2022, 9:15 AM  Clinical Narrative:  CSW spoke with pt's spouse regarding PT rec for SNF. Pt's spouse is agreeable stating "if he really needs it." Pt's spouse prefers [facility]. Referral sent. PASRR pending.                  Expected Discharge Plan: Skilled Nursing Facility Barriers to Discharge: Continued Medical Work up   Patient Goals and CMS Choice Patient states their goals for this hospitalization and ongoing recovery are:: for pt to get better   Choice offered to / list presented to : Spouse  Expected Discharge Plan and Services Expected Discharge Plan: [place] In-house Referral: NA   Post Acute Care Choice: [place] Living arrangements for the past 2 months: Single Family Home                                      Prior Living Arrangements/Services Living arrangements for the past 2 months: Single Family Home Lives with:: Spouse Patient language and need for interpreter reviewed:: Yes Do you feel safe going back to the place where you live?: Yes      Need for Family Participation in Patient Care: Yes (Comment) Care giver support system in place?: Yes (comment)   Criminal Activity/Legal Involvement Pertinent to Current Situation/Hospitalization: No - Comment as needed  Activities of Daily Living Home Assistive Devices/Equipment: Walker (specify type) ADL Screening (condition at time of admission) Patient's cognitive ability adequate to safely complete daily activities?: No Is the patient deaf or have difficulty hearing?: Yes Does the patient have difficulty seeing, even when wearing glasses/contacts?: No Does the patient have difficulty concentrating, remembering, or making decisions?: Yes Patient able  to express need for assistance with ADLs?: No Does the patient have difficulty dressing or bathing?: Yes Independently performs ADLs?: No Communication: Needs assistance Is this a change from baseline?: Pre-admission baseline Dressing (OT): Needs assistance Is this a change from baseline?: Pre-admission baseline Grooming: Dependent Is this a change from baseline?: Pre-admission baseline Feeding: Needs assistance (set up) Is this a change from baseline?: Pre-admission baseline Bathing: Dependent Is this a change from baseline?: Pre-admission baseline Toileting: Needs assistance Is this a change from baseline?: Pre-admission baseline In/Out Bed: Needs assistance Is this a change from baseline?: Change from baseline, expected to last <3 days Walks in Home: Needs assistance Is this a change from baseline?: Change from baseline, expected to last <3 days Does the patient have difficulty walking or climbing stairs?: Yes Weakness of Legs: Both Weakness of Arms/Hands: None  Permission Sought/Granted Permission sought to share information with : Family Supports Permission granted to share information with : Yes, Release of Information Signed  Share Information with NAME: [name]  Permission granted to share info w AGENCY: [facility]  Permission granted to share info w Relationship: spouse     Emotional Assessment Appearance:: Appears stated age Attitude/Demeanor/Rapport: Unable to Assess Affect (typically observed): Unable to Assess Orientation: : Oriented to Self Alcohol / Substance Use: Not Applicable Psych Involvement: No (comment)  Admission diagnosis:  Syncope [R55] Ventricular bigeminy [I49.8] Syncope, unspecified syncope type [R55] Patient Active Problem List   Diagnosis Date Noted   Chronic kidney disease,  stage 3a ([place]) 04/16/2022   Syncope 03/17/2022   COVID-19 virus infection 03/17/2022   Paroxysmal atrial flutter ([place]) 03/17/2022   BPH (benign prostatic  hyperplasia) 03/17/2022   Dementia, senile with depression ([place]) 03/17/2022   DNR (do not resuscitate) 01/11/2022   Atrial flutter ([place]) 11/05/2021   Clostridium difficile colitis 06/04/2021   Left testicular pain 06/04/2021   Sacral pressure sore 06/04/2021   Bradycardia 06/04/2021   Fecal impaction ([place])    Pre-syncope 05/04/2021   Constipation 05/04/2021   Proteinuria 05/04/2021   Orthostatic hypotension 05/04/2021   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   Sepsis with acute organ dysfunction ([place]) 05/30/2020   PAD (peripheral artery disease) ([place]) 03/21/2020   Right leg swelling 02/19/2020   Dysphagia 02/19/2020   Pruritus 02/19/2020   Medicare annual wellness visit, subsequent 08/11/2019   CKD (chronic kidney disease) stage 3, GFR 30-59 ml/min ([place]) 01/13/2019   Health maintenance examination 08/03/2018   Advanced care planning/counseling discussion 08/03/2018   Vitamin B12 deficiency 08/03/2018   Bilateral hearing loss 08/03/2018   Recurrent syncope 06/22/2018   Vitamin D deficiency 05/19/2018   Fall 05/19/2018   Dementia with behavioral disturbance 05/19/2018   Cataract extraction status 03/06/2017   Obstruction of right lacrimal duct 10/16/2015   Exposure keratopathy, 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   Eversion of right lacrimal punctum 05/17/2015   Epiphora due to insufficient drainage of right side 05/17/2015   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 [place] [address] Phone: [phone] Fax: [phone]     Social Determinants of Health (SDOH) Interventions    Readmission Risk Interventions     No data to display

## 2022-04-18 NOTE — Evaluation (Signed)
Occupational Therapy Evaluation Patient Details Name: Dylan [name] MRN: [id number] DOB: [birth year] Today's Date: 04/18/2022   History of Present Illness Pt is an 88 yo male that presented to the ED for syncopal episodes. PMH of afib, CKD, dementia, orthostatic hypotension, s/p ILR.   Clinical Impression   Pt was seen for OT evaluation this date. Prior to hospital admission, pt was living with his spouse and requiring intermittent levels of assist for ADL and IADL. Spouse reports mostly requiring cues to initiate and sequence tasks but still able to participate in ADL and IADL tasks well. Currently pt demonstrates impairments as described below (See OT problem list) which functionally require an increase in assist for ADL tasks as compared to baseline. Pt currently requires supervision for bed mobility, CGA +VC for ADL transfers, min-mod A for LB dressing from bed level with bridging (how pt/spouse do it at home),and CGA in standing for grooming tasks. Pt/spouse educated in dementia care resources and gait belt use at home to maximize safety with ADL/mobility when pt is not amenable to using RW. Orthostatic vitals taken, not positive for OH. Pt would benefit from skilled OT services to address noted impairments and functional limitations (see below for any additional details) in order to maximize safety and independence while minimizing falls risk and caregiver burden. Upon hospital discharge, initially recommending STR to maximize pt safety and return to PLOF.    Recommendations for follow up therapy are one component of a multi-disciplinary discharge planning process, led by the attending physician.  Recommendations may be updated based on patient status, additional functional criteria and insurance authorization.   Follow Up Recommendations  Skilled nursing-short term rehab (<3 hours/day)    Assistance Recommended at Discharge Frequent or constant Supervision/Assistance  Patient can return home  with the following A little help with walking and/or transfers;A little help with bathing/dressing/bathroom;Assistance with cooking/housework;Assist for transportation;Help with stairs or ramp for entrance;Direct supervision/assist for financial management;Direct supervision/assist for medications management    Functional Status Assessment  Patient has had a recent decline in their functional status and/or demonstrates limited ability to make significant improvements in function in a reasonable and predictable amount of time  Equipment Recommendations  None recommended by OT    Recommendations for Other Services       Precautions / Restrictions Precautions Precautions: Fall Restrictions Weight Bearing Restrictions: No      Mobility Bed Mobility Overal bed mobility: Needs Assistance Bed Mobility: Supine to Sit, Sit to Supine     Supine to sit: Supervision, HOB elevated Sit to supine: Supervision, HOB elevated   General bed mobility comments: VC to initiate    Transfers Overall transfer level: Needs assistance Equipment used: Rolling walker (2 wheels) Transfers: Sit to/from Stand Sit to Stand: Min guard           General transfer comment: VC and tactile cue for hand placement EOB to stand      Balance Overall balance assessment: Needs assistance Sitting-balance support: Feet supported, No upper extremity supported Sitting balance-Leahy Scale: Good     Standing balance support: Single extremity supported, Bilateral upper extremity supported, During functional activity, No upper extremity supported Standing balance-Leahy Scale: Fair Standing balance comment: able to stand at sink and prepare toothbrush without UE support or LOB                           ADL either performed or assessed with clinical judgement   ADL Overall ADL's :  Needs assistance/impaired                                       General ADL Comments: Pt requires CGA in  standing with RW and VC to initiate grooming tasks at sink, MIN-MOD A for LB brief mgt from caregiver, MIN A for UB Dressing.     Vision         Perception     Praxis      Pertinent Vitals/Pain Pain Assessment Pain Assessment: No/denies pain     Hand Dominance Right   Extremity/Trunk Assessment Upper Extremity Assessment Upper Extremity Assessment: Overall WFL for tasks assessed   Lower Extremity Assessment Lower Extremity Assessment: Overall WFL for tasks assessed   Cervical / Trunk Assessment Cervical / Trunk Assessment: Normal   Communication Communication Communication: HOH   Cognition Arousal/Alertness: Awake/alert Behavior During Therapy: WFL for tasks assessed/performed Overall Cognitive Status: History of cognitive impairments - at baseline                                 General Comments: Pt able to follow 1-step commands with cues for initiation/sequencing     General Comments       Exercises Other Exercises Other Exercises: Pt/spouse educated in dementia care resources and provided with gait belt for home use to make household mobility safer when pt does not use AD   Shoulder Instructions      Home Living Family/patient expects to be discharged to:: Private residence Living Arrangements: Spouse/significant other Available Help at Discharge: Family;Available 24 hours/day Type of Home: House Home Access: Stairs to enter [employer] of Steps: 3 Entrance Stairs-Rails: Can reach both;Left;Right Home Layout: One level     Bathroom Shower/Tub: Teacher, early years/pre: Standard     Home Equipment: Cane - single point          Prior Functioning/Environment Prior Level of Function : Needs assist  Cognitive Assist : Mobility (cognitive);ADLs (cognitive) Mobility (Cognitive): Step by step cues ADLs (Cognitive): Step by step cues Physical Assist : Mobility (physical);ADLs (physical) Mobility (physical): Bed  mobility;Transfers;Gait;Stairs ADLs (physical): Feeding;Grooming;Bathing;Dressing;Toileting Mobility Comments: Pt sometimes uses RW but more often spouse holds onto the back of his shirt and walks with him ADLs Comments: assist for bathing, dressing, sometimes able to do light housekeeping tasks with VC for sequencing/initiation, set up for self feeding        OT Problem List: Decreased cognition;Decreased safety awareness;Impaired balance (sitting and/or standing);Decreased knowledge of use of DME or AE      OT Treatment/Interventions: Therapeutic activities;Cognitive remediation/compensation;DME and/or AE instruction;Self-care/ADL training;Patient/family education;Balance training    OT Goals(Current goals can be found in the care plan section) Acute Rehab OT Goals Patient Stated Goal: wife wants pt to ultimately return home with her OT Goal Formulation: With patient/family Time For Goal Achievement: 05/02/22 Potential to Achieve Goals: Good ADL Goals Pt Will Perform Lower Body Dressing: sit to/from stand;with caregiver independent in assisting Additional ADL Goal #1: Pt will complete all aspects of toileting with PRN assist from spouse/caregiver and VC for sequencing PRN. Additional ADL Goal #2: Pt will complete simple 1-step ADL/IADL tasks in sitting with VC for sequencing and supervision for safety.  OT Frequency: Min 1X/week    Co-evaluation              AM-PAC  OT "6 Clicks" Daily Activity     Outcome Measure Help from another person eating meals?: None Help from another person taking care of personal grooming?: A Little Help from another person toileting, which includes using toliet, bedpan, or urinal?: A Little Help from another person bathing (including washing, rinsing, drying)?: A Lot Help from another person to put on and taking off regular upper body clothing?: A Little Help from another person to put on and taking off regular lower body clothing?: A Lot 6 Click  Score: 17   End of Session Equipment Utilized During Treatment: Rolling walker (2 wheels)  Activity Tolerance: Patient tolerated treatment well Patient left: in bed;with call bell/phone within reach;with bed alarm set;with family/visitor present  OT Visit Diagnosis: Other abnormalities of gait and mobility (R26.89);Other symptoms and signs involving cognitive function                Time: 1459-1541 OT Time Calculation (min): 42 min Charges:  OT General Charges $OT Visit: 1 Visit OT Evaluation $OT Eval Moderate Complexity: 1 Mod OT Treatments $Self Care/Home Management : 23-37 mins  Ardeth Perfect., MPH, MS, OTR/L ascom [phone] 04/18/22, 4:04 PM

## 2022-04-18 NOTE — NC FL2 (Addendum)
[place] LEVEL OF CARE SCREENING TOOL     IDENTIFICATION  Patient Name: Dylan [name] Birthdate: 12-18-1933 Sex: male Admission Date (Current Location): 04/16/2022  [facility] and [place] Number:  Engineering geologist and Address:  [facility], [address] [place], [place] [address]      Provider Number: [id number]  Attending Physician Name and Address:  Shawna Clamp, MD  Relative Name and Phone Number:       Current Level of Care: Hospital Recommended Level of Care: [place] Prior Approval Number:    Date Approved/Denied:   PASRR Number:  [id number]  Discharge Plan: SNF    Current Diagnoses: Patient Active Problem List   Diagnosis Date Noted   Chronic kidney disease, stage 3a ([place]) 04/16/2022   Syncope 03/17/2022   COVID-19 virus infection 03/17/2022   Paroxysmal atrial flutter ([place]) 03/17/2022   BPH (benign prostatic hyperplasia) 03/17/2022   Dementia, senile with depression ([place]) 03/17/2022   DNR (do not resuscitate) 01/11/2022   Atrial flutter ([name]) 11/05/2021   Clostridium difficile colitis 06/04/2021   Left testicular pain 06/04/2021   Sacral pressure sore 06/04/2021   Bradycardia 06/04/2021   Fecal impaction ([place])    Pre-syncope 05/04/2021   Constipation 05/04/2021   Proteinuria 05/04/2021   Orthostatic hypotension 05/04/2021   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   Sepsis with acute organ dysfunction ([place]) 05/30/2020   PAD (peripheral artery disease) ([place]) 03/21/2020   Right leg swelling 02/19/2020   Dysphagia 02/19/2020   Pruritus 02/19/2020   Medicare annual wellness visit, subsequent 08/11/2019   CKD (chronic kidney disease) stage 3, GFR 30-59 ml/min ([place]) 01/13/2019   Health maintenance examination 08/03/2018   Advanced care planning/counseling discussion 08/03/2018   Vitamin B12 deficiency 08/03/2018   Bilateral hearing loss 08/03/2018   Recurrent syncope 06/22/2018   Vitamin D deficiency  05/19/2018   Fall 05/19/2018   Dementia with behavioral disturbance 05/19/2018   Cataract extraction status 03/06/2017   Obstruction of right lacrimal duct 10/16/2015   Exposure keratopathy, 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   Eversion of right lacrimal punctum 05/17/2015   Epiphora due to insufficient drainage of right side 05/17/2015   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   Basal cell carcinoma of right nasal sidewall 12/15/2014   Mohs defect of tear duct of right eye 12/15/2014   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/2014   Mohs defect of eyelid 12/14/2014   Thrombocytopenia ([place]) 02/21/2011   Dyslipidemia 02/04/2008   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   ERECTILE DYSFUNCTION, MILD 02/03/2007   Spermatocele of epididymis, single 02/03/2007    Orientation RESPIRATION BLADDER Height & Weight     Self  Normal Incontinent Weight: 187 lb (84.8 kg) Height:  '6\' 1"'$  (185.4 cm)  BEHAVIORAL SYMPTOMS/MOOD NEUROLOGICAL BOWEL NUTRITION STATUS      Continent Diet (heart healthy)  AMBULATORY STATUS COMMUNICATION OF NEEDS Skin   Limited Assist Verbally Normal                       Personal Care Assistance Level of Assistance  Bathing, Feeding, Dressing Bathing Assistance: Limited assistance Feeding assistance: Independent Dressing Assistance: Limited assistance     Functional Limitations Info  Sight, Hearing, Speech Sight Info: Adequate Hearing Info: Adequate Speech Info: Adequate    SPECIAL CARE FACTORS FREQUENCY  PT (By licensed PT), OT (By licensed OT)     PT Frequency: 5x OT Frequency: 5x  Contractures Contractures Info: Not present    Additional Factors Info  Code Status, Allergies Code Status Info: DNR Allergies Info: Doxepin, Lincomycin Hcl           Current Medications (04/18/2022):  This is the current hospital active medication list Current Facility-Administered Medications  Medication Dose  Route Frequency Provider Last Rate Last Admin   0.9 %  sodium chloride infusion   Intravenous Continuous Shawna Clamp, MD 75 mL/hr at 04/18/22 0650 New Bag at 04/18/22 0650   acetaminophen (TYLENOL) tablet 650 mg  650 mg Oral Q6H PRN Ivor Costa, MD       acidophilus (RISAQUAD) capsule 1 capsule  1 capsule Oral Daily Ivor Costa, MD   1 capsule at 04/18/22 5176   cholecalciferol (VITAMIN D3) tablet 1,000 Units  1,000 Units Oral Gordan Payment, MD   1,000 Units at 04/17/22 2151   finasteride (PROSCAR) tablet 5 mg  5 mg Oral Daily Ivor Costa, MD   5 mg at 04/18/22 1607   FLUoxetine (PROZAC) capsule 20 mg  20 mg Oral Daily Ivor Costa, MD   20 mg at 04/18/22 0852   guaiFENesin-dextromethorphan (ROBITUSSIN DM) 100-10 MG/5ML syrup 10 mL  10 mL Oral Q4H PRN Ivor Costa, MD       loratadine (CLARITIN) tablet 10 mg  10 mg Oral Daily Ivor Costa, MD   10 mg at 04/18/22 0852   memantine (NAMENDA XR) 24 hr capsule 14 mg  14 mg Oral Henreitta Cea, MD   14 mg at 04/17/22 0842   midodrine (PROAMATINE) tablet 5 mg  5 mg Oral TID Renella Cunas, MD   5 mg at 04/18/22 0852   ondansetron (ZOFRAN) injection 4 mg  4 mg Intravenous Q8H PRN Ivor Costa, MD       senna (SENOKOT) tablet 8.6 mg  1 tablet Oral QHS PRN Ivor Costa, MD       vitamin B-12 (CYANOCOBALAMIN) tablet 500 mcg  500 mcg Oral QHS Ivor Costa, MD   500 mcg at 04/17/22 2151     Discharge Medications: Please see discharge summary for a list of discharge medications.  Relevant Imaging Results:  Relevant Lab Results:   Additional Information [id number]  Eileen Stanford, LCSW

## 2022-04-18 NOTE — TOC Progression Note (Signed)
Transition of Care [facility]) - Progression Note    Patient Details  Name: Dylan [name] MRN: [id number] Date of Birth: 1934/02/02  [facility] CM/SW Contact  Eileen Stanford, LCSW Phone Number: 04/18/2022, 10:25 AM  Clinical Narrative:   Clincials uploaded to [address].    Expected Discharge Plan: [place] Barriers to Discharge: Continued Medical Work up  Expected Discharge Plan and Services Expected Discharge Plan: [place] In-house Referral: NA   Post Acute Care Choice: [place] Living arrangements for the past 2 months: Single Family Home                                       Social Determinants of Health (SDOH) Interventions    Readmission Risk Interventions     No data to display

## 2022-04-18 NOTE — Progress Notes (Signed)
PROGRESS NOTE    Dylan [name]  YFV:[id number] DOB: 11/03/1934 DOA: 04/16/2022 PCP: [name], MD   Brief Narrative: This 88 years old male with PMH significant for dementia, hyperlipidemia, atrial flutter on Eliquis, CKD stage IIIa, BPH, orthostatic hypotension, thrombocytopenia, PAD, history of C. difficile, hard of hearing presented in the ED with recurrent syncope. Patient has history of syncope currently being monitored by cardiologist.  Patient has a loop recorder placed.  Patient had 2 episodes of syncope on morning of admission.  While syncope patient fell and injured his head.  Patient does not have any numbness, weakness,  no facial droop or slurred speech.  Patient also denies chest pain or shortness of breath.  CT head negative, CT C-spine negative for any acute injury.  EKG shows bifascicular block,  ventricular bigeminy.  Cardiology is consulted thinks the syncope is multifactorial.  Cardio signed off.  Assessment & Plan:   Principal Problem:   Syncope Active Problems:   Orthostatic hypotension   Paroxysmal atrial flutter (HCC)   Dyslipidemia   Thrombocytopenia (HCC)   BPH (benign prostatic hyperplasia)   Dementia, senile with depression ([place])   Chronic kidney disease, stage 3a (HCC)  Syncope: Patient presented with recurrent syncope and fall.  Imaging negative for acute injury. Etiology is not clear.  It could be multifactorial due to vasovagal syncope, cardiac arrhythmias and orthostatic hypotension.  There is no neurofocal deficit on physical exam.  TIA or stroke is unlikely.   Patient denies any chest pain so there is low suspicion for ACS. EKG shows ventricular bigeminy and bifascicular block. Patient is wearing loop recorder,  is monitored by cardiologist. Cardiology is consulted,  recommended to increase fluid intake,  continue midodrine. Echo showed perserved EF.  No significant arrhythmias noted on telemetry. Recommended to hold Eliquis for now given more  risks than benefits. Resumption of anticoagulation can be addressed at primary care cardiologist visit. No additional cardiac testing indicated at this point.  Cardiology signed off  Orthostatic hypotension: Continue midodrine, continue gentle IV hydration.  Paroxysmal atrial fibrillation: Hold Eliquis.  Heart rate is controlled. Patient has recurrent syncope and falls,  there is high risk of injury. Resumption of anticoagulation will be addressed at primary care cardiologist visit.  AKI on CKD stage IIIa: Baseline serum creatinine 1.2, presented with creatinine 1.5 on admission Renal functions back to baseline.  Avoid nephrotoxic medications.  Dementia / Depression: Continue Namenda and Prozac.  BPH: Continue finasteride 5 mg daily..  Thrombocytopenia: Patient does have chronic thrombocytopenia, Baseline platelet count 86.  Unclear etiology.  Dyslipidemia Patient not taking any anticholesterol medications  Generalised weakness/deconditioning :  PT recommended SNF,  awaiting insurance authorization   DVT prophylaxis:  SCDs Code Status: DNR Family Communication: Wife at bed side. Disposition Plan:   Status is: Inpatient Remains inpatient appropriate because:    Admitted for recurrent syncope,  EKG shows ventricular bigeminy and trifascicular block. Cardiology is consulted, no plans for any cardiac testing at this time. PT recommended SNF,  awaiting insurance authorization.   Consultants:  Cardiology  Procedures: Echocardiogram  Antimicrobials: None.  Subjective: Patient was seen and examined at bedside.  Overnight events noted. Patient reports feeling better, He is alert, following commands.  Denies any complaints Patient does have dementia, wife reports could not sleep last night.  Objective: Vitals:   04/17/22 1617 04/17/22 2018 04/18/22 0444 04/18/22 0754  BP: (!) 117/44 (!) 114/54 (!) 111/55 113/66  Pulse: (!) 45 (!) 51 (!) 57 (!) 53  Resp: '18 16 16 18   '$ Temp: 98 F (36.7 C) 98.1 F (36.7 C) 98.1 F (36.7 C) (!) 97.5 F (36.4 C)  TempSrc: Oral Oral Oral   SpO2: 98% 98% 99% 96%  Weight:      Height:        Intake/Output Summary (Last 24 hours) at 04/18/2022 1210 Last data filed at 04/18/2022 1000 Gross per 24 hour  Intake 1787.48 ml  Output --  Net 1787.48 ml   Filed Weights   04/16/22 0655  Weight: 84.8 kg    Examination:  General exam: Appears comfortable, not in any acute distress.  Deconditioned Respiratory system: CTA bilaterally, no accessory muscle use, normal respiratory effort. Cardiovascular system: S1-S2 heard, regular rate and rhythm, no murmur. Gastrointestinal system: Abdomen is soft, non tender, non distended, BS+ Central nervous system: Alert and oriented x 2 . No focal neurological deficits. Extremities: No edema, no cyanosis, no clubbing. Skin: No rashes, lesions or ulcers Psychiatry: Judgement and insight appear normal. Mood & affect appropriate.     Data Reviewed: I have personally reviewed following labs and imaging studies  CBC: Recent Labs  Lab 04/16/22 0920  WBC 7.9  NEUTROABS 6.2  HGB 15.5  HCT 45.2  MCV 90.8  PLT 86*   Basic Metabolic Panel: Recent Labs  Lab 04/16/22 0920 04/17/22 0732  NA 139 139  K 4.5 4.1  CL 106 106  CO2 29 28  GLUCOSE 99 90  BUN 17 14  CREATININE 1.50* 1.22  CALCIUM 9.6 9.3   GFR: Estimated Creatinine Clearance: 49.1 mL/min (by C-G formula based on SCr of 1.22 mg/dL). Liver Function Tests: No results for input(s): "AST", "ALT", "ALKPHOS", "BILITOT", "PROT", "ALBUMIN" in the last 168 hours. No results for input(s): "LIPASE", "AMYLASE" in the last 168 hours. No results for input(s): "AMMONIA" in the last 168 hours. Coagulation Profile: No results for input(s): "INR", "PROTIME" in the last 168 hours. Cardiac Enzymes: Recent Labs  Lab 04/16/22 1100  CKTOTAL 52   BNP (last 3 results) No results for input(s): "PROBNP" in the last 8760  hours. HbA1C: No results for input(s): "HGBA1C" in the last 72 hours. CBG: No results for input(s): "GLUCAP" in the last 168 hours. Lipid Profile: No results for input(s): "CHOL", "HDL", "LDLCALC", "TRIG", "CHOLHDL", "LDLDIRECT" in the last 72 hours. Thyroid Function Tests: No results for input(s): "TSH", "T4TOTAL", "FREET4", "T3FREE", "THYROIDAB" in the last 72 hours. Anemia Panel: No results for input(s): "VITAMINB12", "FOLATE", "FERRITIN", "TIBC", "IRON", "RETICCTPCT" in the last 72 hours. Sepsis Labs: No results for input(s): "PROCALCITON", "LATICACIDVEN" in the last 168 hours.  No results found for this or any previous visit (from the past 240 hour(s)).   Radiology Studies: No results found.   Scheduled Meds:  acidophilus  1 capsule Oral Daily   cholecalciferol  1,000 Units Oral QHS   finasteride  5 mg Oral Daily   FLUoxetine  20 mg Oral Daily   loratadine  10 mg Oral Daily   memantine  14 mg Oral QODAY   midodrine  5 mg Oral TID AC   vitamin B-12  500 mcg Oral QHS   Continuous Infusions:  sodium chloride 75 mL/hr at 04/18/22 0650     LOS: 1 day    Time spent: 35 mins    Jasir Rother, MD [facility]   If 7PM-7AM, please contact night-coverage

## 2022-04-18 NOTE — Clinical Social Work Note (Signed)
RE:  Dylan [name]   Date of Birth:   05-30-1935___________ Date: 04/18/2022       To Whom It May Concern:  Please be advised that the above-named patient will require a short-term nursing home stay - anticipated 30 days or less for rehabilitation and strengthening.  The plan is for return home.   MD electronic signature noted below

## 2022-04-19 DIAGNOSIS — R55 Syncope and collapse: Secondary | ICD-10-CM | POA: Diagnosis not present

## 2022-04-19 LAB — CBC
HCT: 40.9 % (ref 39.0–52.0)
Hemoglobin: 14.1 g/dL (ref 13.0–17.0)
MCH: 31.1 pg (ref 26.0–34.0)
MCHC: 34.5 g/dL (ref 30.0–36.0)
MCV: 90.3 fL (ref 80.0–100.0)
Platelets: 81 10*3/uL — ABNORMAL LOW (ref 150–400)
RBC: 4.53 MIL/uL (ref 4.22–5.81)
RDW: 13.1 % (ref 11.5–15.5)
WBC: 6.1 10*3/uL (ref 4.0–10.5)
nRBC: 0 % (ref 0.0–0.2)

## 2022-04-19 NOTE — Care Management Important Message (Signed)
Important Message  Patient Details  Name: IBN STIEF MRN: [id number] Date of Birth: Jan 22, 1934   Medicare Important Message Given:  N/A - LOS <3 / Initial given by admissions     Juliann Pulse A Aurthur Wingerter 04/19/2022, 7:47 AM

## 2022-04-19 NOTE — Progress Notes (Signed)
Physical Therapy Treatment Patient Details Name: Dylan [name] MRN: [id number] DOB: 1934-05-27 Today's Date: 04/19/2022   History of Present Illness Pt is an 88 yo male that presented to the ED for syncopal episodes. PMH of afib, CKD, dementia, orthostatic hypotension, s/p ILR.    PT Comments    Patient alert, does respond to name, reported he was "I'm doing" when asked how he was feeling. Pt less impulsive and restless overall this session. Bed mobility with supervision and verbal cueing to initiate, no physical assistance. Sit <> stand several times with RW and minA for RW stabilization for orthostatic BP assessment as well as to doff/don briefs (able to do so with supervision, cueing). BP soft but not positive for orthostatics today, however the patient still complained of dizziness. HR monitored and in the 30s with standing, further ambulation deferred. RN notified of pt HR status (returns to 50s-60s at rest). The patient would benefit from further skilled PT intervention to continue to progress towards goals. Recommendation remains appropriate.     Recommendations for follow up therapy are one component of a multi-disciplinary discharge planning process, led by the attending physician.  Recommendations may be updated based on patient status, additional functional criteria and insurance authorization.  Follow Up Recommendations  Skilled nursing-short term rehab (<3 hours/day)     Assistance Recommended at Discharge Frequent or constant Supervision/Assistance  Patient can return home with the following A lot of help with walking and/or transfers;A lot of help with bathing/dressing/bathroom;Assistance with cooking/housework;Assist for transportation;Help with stairs or ramp for entrance;Direct supervision/assist for medications management   Equipment Recommendations  Other (comment) (TBD)    Recommendations for Other Services       Precautions / Restrictions Precautions Precautions:  Fall Restrictions Weight Bearing Restrictions: No     Mobility  Bed Mobility Overal bed mobility: Needs Assistance Bed Mobility: Supine to Sit, Sit to Supine     Supine to sit: Supervision, HOB elevated Sit to supine: Supervision, HOB elevated   General bed mobility comments: VC to initiate    Transfers Overall transfer level: Needs assistance Equipment used: Rolling walker (2 wheels) Transfers: Sit to/from Stand Sit to Stand: Min assist, Min guard           General transfer comment: cga-minA for RW steadying.    Ambulation/Gait               General Gait Details: deferred today due to low HR   Stairs             Wheelchair Mobility    Modified Rankin (Stroke Patients Only)       Balance Overall balance assessment: Needs assistance Sitting-balance support: Feet supported, No upper extremity supported Sitting balance-Leahy Scale: Good Sitting balance - Comments: able to get his underwear off/on with verbal cues and instructions to sit/stand   Standing balance support: Bilateral upper extremity supported, Single extremity supported Standing balance-Leahy Scale: Fair Standing balance comment: able to statically stand with PT for several minutes                            Cognition Arousal/Alertness: Awake/alert Behavior During Therapy: WFL for tasks assessed/performed Overall Cognitive Status: History of cognitive impairments - at baseline                                 General Comments: Pt able to follow 1-step commands  with extended time        Exercises      General Comments        Pertinent Vitals/Pain Pain Assessment Pain Assessment: Faces Faces Pain Scale: No hurt    Home Living                          Prior Function            PT Goals (current goals can now be found in the care plan section) Progress towards PT goals: Progressing toward goals    Frequency    Min 2X/week (PT  trial to further assess patient's ability to participate with therapy)      PT Plan Current plan remains appropriate    Co-evaluation              AM-PAC PT "6 Clicks" Mobility   Outcome Measure  Help needed turning from your back to your side while in a flat bed without using bedrails?: A Little Help needed moving from lying on your back to sitting on the side of a flat bed without using bedrails?: A Little Help needed moving to and from a bed to a chair (including a wheelchair)?: A Little Help needed standing up from a chair using your arms (e.g., wheelchair or bedside chair)?: A Little Help needed to walk in hospital room?: A Little Help needed climbing 3-5 steps with a railing? : A Little 6 Click Score: 18    End of Session Equipment Utilized During Treatment: Gait belt Activity Tolerance: Patient tolerated treatment well;Other (comment) (limited by pt reported dizziness, low HR) Patient left: in bed;with bed alarm set;with call bell/phone within reach;with family/visitor present Nurse Communication: Mobility status;Other (comment) (low HR with mobility) PT Visit Diagnosis: Other abnormalities of gait and mobility (R26.89);Difficulty in walking, not elsewhere classified (R26.2);Muscle weakness (generalized) (M62.81)     Time: 6045-4098 PT Time Calculation (min) (ACUTE ONLY): 21 min  Charges:  $Therapeutic Activity: 8-22 mins                     Lieutenant Diego PT, DPT 2:36 PM,04/19/22

## 2022-04-19 NOTE — Progress Notes (Signed)
PROGRESS NOTE    Dylan [name]  RFX:[id number] DOB: May 24, 1934 DOA: 04/16/2022 PCP: [name], MD   Brief Narrative: This 88 years old male with PMH significant for dementia, hyperlipidemia, atrial flutter on Eliquis, CKD stage IIIa, BPH, orthostatic hypotension, thrombocytopenia, PAD, history of C. difficile, hard of hearing presented in the ED with recurrent syncope. Patient has history of syncope currently being monitored by cardiologist.  Patient has a loop recorder placed.  Patient had 2 episodes of syncope on morning of admission.  While syncope patient fell and injured his head.  Patient does not have any numbness, weakness,  no facial droop or slurred speech.  Patient also denies chest pain or shortness of breath.  CT head negative, CT C-spine negative for any acute injury.  EKG shows bifascicular block,  ventricular bigeminy.  Cardiology is consulted thinks the syncope is multifactorial.  Cardio signed off.  Assessment & Plan:   Principal Problem:   Syncope Active Problems:   Orthostatic hypotension   Paroxysmal atrial flutter (HCC)   Dyslipidemia   Thrombocytopenia (HCC)   BPH (benign prostatic hyperplasia)   Dementia, senile with depression ([place])   Chronic kidney disease, stage 3a (HCC)  Syncope: Patient presented with recurrent syncope and fall.  Imaging negative for acute injury. Etiology is not clear.  It could be multifactorial due to vasovagal syncope, cardiac arrhythmias and orthostatic hypotension.   There is no neurofocal deficit on physical exam.  TIA or stroke is unlikely.   Patient denies any chest pain, Trop x negative so there is low suspicion for ACS. EKG shows ventricular bigeminy and bifascicular block. Patient is wearing loop recorder,  is monitored by cardiologist. Cardiology is consulted,  recommended to increase fluid intake,  continue midodrine. Echo showed perserved EF.  No significant arrhythmias noted on telemetry. Cardio recommended to hold  Eliquis for now given more risks than benefits. Resumption of anticoagulation can be addressed at primary care cardiologist visit. No additional cardiac testing indicated at this point.  Cardiology signed off  Orthostatic hypotension: Continue midodrine, continue gentle IV hydration.  Paroxysmal atrial fibrillation: Hold Eliquis.  Heart rate is controlled. Patient has recurrent syncope and falls,  there is high risk of injury. Resumption of anticoagulation will be addressed at primary care cardiologist visit.  AKI on CKD stage IIIa: Baseline serum creatinine 1.2, presented with creatinine 1.5 on admission Renal functions back to baseline.  Avoid nephrotoxic medications.  Dementia / Depression: Continue Namenda and Prozac.  BPH: Continue finasteride 5 mg daily..  Thrombocytopenia: Patient does have chronic thrombocytopenia, Baseline platelet count 86.  Unclear etiology.  Dyslipidemia Patient not taking any anticholesterol medications  Generalised weakness/deconditioning :  PT recommended SNF,  awaiting insurance authorization   DVT prophylaxis:  SCDs Code Status: DNR Family Communication: Wife at bed side. Disposition Plan:   Status is: Inpatient Remains inpatient appropriate because:    Admitted for recurrent syncope,  EKG shows ventricular bigeminy and trifascicular block. Cardiology is consulted, no plans for any cardiac testing at this time. PT recommended SNF,  awaiting insurance authorization.   Consultants:  Cardiology  Procedures: Echocardiogram  Antimicrobials: None.  Subjective: Patient was seen and examined at bedside.  Overnight events noted. Patient reports feeling much better, he is alert, following commands.  Denies any complaints. Patient has dementia and is very hard of hearing.  Wife at bedside, states he looks better today.   Objective: Vitals:   04/18/22 1608 04/18/22 2209 04/19/22 0542 04/19/22 0853  BP: 101/62 (!) 105/53 116/62 Marland Kitchen)  102/59  Pulse: 65 (!) 49 64 (!) 53  Resp: '18 18 18 16  '$ Temp:  98.1 F (36.7 C) 98 F (36.7 C) (!) 97.4 F (36.3 C)  TempSrc:    Oral  SpO2: 97% 93% 94% 100%  Weight:      Height:        Intake/Output Summary (Last 24 hours) at 04/19/2022 1120 Last data filed at 04/19/2022 0953 Gross per 24 hour  Intake 1986.25 ml  Output --  Net 1986.25 ml   Filed Weights   04/16/22 0655  Weight: 84.8 kg    Examination:  General exam: Appears deconditioned.  Comfortable but not in any acute distress. Respiratory system: CTA bilaterally, normal respiratory effort, no accessory muscle use. Cardiovascular system: S1-S2 heard, regular rate and rhythm, no murmur. Gastrointestinal system: Abdomen is soft, non tender, non distended, BS+ Central nervous system: Alert and oriented x 2 . No focal neurological deficits. Extremities: No edema, no cyanosis, no clubbing. Skin: No rashes, lesions or ulcers Psychiatry: Judgement and insight appear normal. Mood & affect appropriate.     Data Reviewed: I have personally reviewed following labs and imaging studies  CBC: Recent Labs  Lab 04/16/22 0920 04/19/22 0638  WBC 7.9 6.1  NEUTROABS 6.2  --   HGB 15.5 14.1  HCT 45.2 40.9  MCV 90.8 90.3  PLT 86* 81*   Basic Metabolic Panel: Recent Labs  Lab 04/16/22 0920 04/17/22 0732  NA 139 139  K 4.5 4.1  CL 106 106  CO2 29 28  GLUCOSE 99 90  BUN 17 14  CREATININE 1.50* 1.22  CALCIUM 9.6 9.3   GFR: Estimated Creatinine Clearance: 49.1 mL/min (by C-G formula based on SCr of 1.22 mg/dL). Liver Function Tests: No results for input(s): "AST", "ALT", "ALKPHOS", "BILITOT", "PROT", "ALBUMIN" in the last 168 hours. No results for input(s): "LIPASE", "AMYLASE" in the last 168 hours. No results for input(s): "AMMONIA" in the last 168 hours. Coagulation Profile: No results for input(s): "INR", "PROTIME" in the last 168 hours. Cardiac Enzymes: Recent Labs  Lab 04/16/22 1100  CKTOTAL 52   BNP (last  3 results) No results for input(s): "PROBNP" in the last 8760 hours. HbA1C: No results for input(s): "HGBA1C" in the last 72 hours. CBG: No results for input(s): "GLUCAP" in the last 168 hours. Lipid Profile: No results for input(s): "CHOL", "HDL", "LDLCALC", "TRIG", "CHOLHDL", "LDLDIRECT" in the last 72 hours. Thyroid Function Tests: No results for input(s): "TSH", "T4TOTAL", "FREET4", "T3FREE", "THYROIDAB" in the last 72 hours. Anemia Panel: No results for input(s): "VITAMINB12", "FOLATE", "FERRITIN", "TIBC", "IRON", "RETICCTPCT" in the last 72 hours. Sepsis Labs: No results for input(s): "PROCALCITON", "LATICACIDVEN" in the last 168 hours.  No results found for this or any previous visit (from the past 240 hour(s)).   Radiology Studies: No results found.   Scheduled Meds:  acidophilus  1 capsule Oral Daily   cholecalciferol  1,000 Units Oral QHS   finasteride  5 mg Oral Daily   FLUoxetine  20 mg Oral Daily   loratadine  10 mg Oral Daily   memantine  14 mg Oral QODAY   midodrine  5 mg Oral TID AC   vitamin B-12  500 mcg Oral QHS   Continuous Infusions:     LOS: 2 days    Time spent: 35 mins    Gaspar Fowle, MD [facility]   If 7PM-7AM, please contact night-coverage

## 2022-04-20 DIAGNOSIS — R55 Syncope and collapse: Secondary | ICD-10-CM | POA: Diagnosis not present

## 2022-04-20 LAB — BASIC METABOLIC PANEL
Anion gap: 5 (ref 5–15)
BUN: 19 mg/dL (ref 8–23)
CO2: 28 mmol/L (ref 22–32)
Calcium: 9.2 mg/dL (ref 8.9–10.3)
Chloride: 108 mmol/L (ref 98–111)
Creatinine, Ser: 1.35 mg/dL — ABNORMAL HIGH (ref 0.61–1.24)
GFR, Estimated: 51 mL/min — ABNORMAL LOW (ref >60)
Glucose, Bld: 92 mg/dL (ref 70–99)
Potassium: 3.9 mmol/L (ref 3.5–5.1)
Sodium: 141 mmol/L (ref 135–145)

## 2022-04-20 LAB — CBC
HCT: 42.3 % (ref 39.0–52.0)
Hemoglobin: 14.5 g/dL (ref 13.0–17.0)
MCH: 31.2 pg (ref 26.0–34.0)
MCHC: 34.3 g/dL (ref 30.0–36.0)
MCV: 91 fL (ref 80.0–100.0)
Platelets: 83 10*3/uL — ABNORMAL LOW (ref 150–400)
RBC: 4.65 MIL/uL (ref 4.22–5.81)
RDW: 13.1 % (ref 11.5–15.5)
WBC: 6.1 10*3/uL (ref 4.0–10.5)
nRBC: 0 % (ref 0.0–0.2)

## 2022-04-20 MED ORDER — SODIUM CHLORIDE 0.9 % IV SOLN: INTRAVENOUS | Status: AC

## 2022-04-20 NOTE — Progress Notes (Signed)
PROGRESS NOTE    Dylan [name]  [id number]:[id number] DOB: 11-07-1934 DOA: 04/16/2022 PCP: [name], MD   Brief Narrative: This 88 years old male with PMH significant for dementia, hyperlipidemia, atrial flutter on Eliquis, CKD stage IIIa, BPH, orthostatic hypotension, thrombocytopenia, PAD, history of C. difficile, hard of hearing presented in the ED with recurrent syncope. Patient has history of syncope currently being monitored by cardiologist.  Patient has a loop recorder placed.  Patient had 2 episodes of syncope on morning of admission.  While syncope patient fell and injured his head.  Patient does not have any numbness, weakness,  no facial droop or slurred speech.  Patient also denies chest pain or shortness of breath.  CT head negative, CT C-spine negative for any acute injury.  EKG shows bifascicular block,  ventricular bigeminy.  Cardiology is consulted thinks the syncope is multifactorial.  Cardio signed off.  Assessment & Plan:   Principal Problem:   Syncope Active Problems:   Orthostatic hypotension   Paroxysmal atrial flutter (HCC)   Dyslipidemia   Thrombocytopenia (HCC)   BPH (benign prostatic hyperplasia)   Dementia, senile with depression ([place])   Chronic kidney disease, stage 3a (HCC)  Syncope: Patient presented with recurrent syncope and fall.  Imaging negative for acute injury. Etiology is not clear.  It could be multifactorial due to vasovagal syncope, cardiac arrhythmias and orthostatic hypotension.   There is no neurofocal deficit on physical exam.  TIA or stroke is unlikely.   Patient denies any chest pain, Trop x negative so there is low suspicion for ACS. EKG shows ventricular bigeminy and bifascicular block. Patient is wearing loop recorder,  is monitored by cardiologist. Cardiology is consulted,  recommended to increase fluid intake,  continue midodrine. Echo showed perserved EF.  No significant arrhythmias noted on telemetry. Cardio recommended to hold  Eliquis for now given more risks than benefits. Resumption of anticoagulation can be addressed at primary care cardiologist visit. No additional cardiac testing indicated at this point.  Cardiology signed off.  Orthostatic hypotension: Continue midodrine, continue gentle IV hydration.  Paroxysmal atrial fibrillation: Hold Eliquis.  Heart rate is controlled. Patient has recurrent syncope and falls,  there is high risk of injury. Resumption of anticoagulation will be addressed at primary care cardiologist visit.  AKI on CKD stage IIIa: Baseline serum creatinine 1.2, presented with creatinine 1.5 on admission. Renal functions improved, creatinine slightly up today 1.35, Avoid nephrotoxic medications. Continue IV gentle hydration.  Dementia / Depression: Continue Namenda and Prozac.  BPH: Continue finasteride 5 mg daily..  Thrombocytopenia: Patient does have chronic thrombocytopenia, Baseline platelet count 86.  Unclear etiology.  Dyslipidemia Patient not taking any anticholesterol medications  Generalised weakness/deconditioning :  PT recommended SNF,  awaiting insurance authorization   DVT prophylaxis:  SCDs Code Status: DNR Family Communication: Wife at bed side. Disposition Plan:   Status is: Inpatient Remains inpatient appropriate because:    Admitted for recurrent syncope,  EKG shows ventricular bigeminy and trifascicular block. Cardiology is consulted, no plans for any cardiac testing at this time. PT recommended SNF,  awaiting insurance authorization.   Consultants:  Cardiology  Procedures: Echocardiogram  Antimicrobials: None.  Subjective: Patient was seen and examined at bedside.  Overnight events noted. Patient reports feeling better, he is alert, following commands,  he was having breakfast. Patient does have dementia and is hard of hearing.  Wife at bedside , concerned about his low BP during PT session.    Objective: Vitals:   04/19/22 2136  04/19/22 2235 04/20/22 0557 04/20/22 0900  BP: (!) 116/33 113/71 109/60 (!) 100/53  Pulse: (!) 52 (!) 51 (!) 57 67  Resp: '18  18 18  '$ Temp: 98.3 F (36.8 C)  98.1 F (36.7 C) 98.5 F (36.9 C)  TempSrc: Oral  Oral   SpO2: 96%  96% 99%  Weight:      Height:       No intake or output data in the 24 hours ending 04/20/22 1046  Filed Weights   04/16/22 0655  Weight: 84.8 kg    Examination:  General exam: Appears comfortable, not in any acute distress.  Deconditioned Respiratory system: CTA bilaterally, normal respiratory effort, RR 15. Cardiovascular system: S1-S2 heard, regular rate and rhythm, no murmur. Gastrointestinal system: Abdomen is soft, non tender, non distended, BS+ Central nervous system: Alert and oriented x 2, no focal neurological deficits. Extremities: No edema, no cyanosis, no clubbing. Skin: No rashes, lesions or ulcers Psychiatry: Mood & affect appropriate.     Data Reviewed: I have personally reviewed following labs and imaging studies  CBC: Recent Labs  Lab 04/16/22 0920 04/19/22 0638 04/20/22 0608  WBC 7.9 6.1 6.1  NEUTROABS 6.2  --   --   HGB 15.5 14.1 14.5  HCT 45.2 40.9 42.3  MCV 90.8 90.3 91.0  PLT 86* 81* 83*   Basic Metabolic Panel: Recent Labs  Lab 04/16/22 0920 04/17/22 0732 04/20/22 0608  NA 139 139 141  K 4.5 4.1 3.9  CL 106 106 108  CO2 '29 28 28  '$ GLUCOSE 99 90 92  BUN '17 14 19  '$ CREATININE 1.50* 1.22 1.35*  CALCIUM 9.6 9.3 9.2   GFR: Estimated Creatinine Clearance: 44.4 mL/min (A) (by C-G formula based on SCr of 1.35 mg/dL (H)). Liver Function Tests: No results for input(s): "AST", "ALT", "ALKPHOS", "BILITOT", "PROT", "ALBUMIN" in the last 168 hours. No results for input(s): "LIPASE", "AMYLASE" in the last 168 hours. No results for input(s): "AMMONIA" in the last 168 hours. Coagulation Profile: No results for input(s): "INR", "PROTIME" in the last 168 hours. Cardiac Enzymes: Recent Labs  Lab 04/16/22 1100  CKTOTAL  52   BNP (last 3 results) No results for input(s): "PROBNP" in the last 8760 hours. HbA1C: No results for input(s): "HGBA1C" in the last 72 hours. CBG: No results for input(s): "GLUCAP" in the last 168 hours. Lipid Profile: No results for input(s): "CHOL", "HDL", "LDLCALC", "TRIG", "CHOLHDL", "LDLDIRECT" in the last 72 hours. Thyroid Function Tests: No results for input(s): "TSH", "T4TOTAL", "FREET4", "T3FREE", "THYROIDAB" in the last 72 hours. Anemia Panel: No results for input(s): "VITAMINB12", "FOLATE", "FERRITIN", "TIBC", "IRON", "RETICCTPCT" in the last 72 hours. Sepsis Labs: No results for input(s): "PROCALCITON", "LATICACIDVEN" in the last 168 hours.  No results found for this or any previous visit (from the past 240 hour(s)).   Radiology Studies: No results found.   Scheduled Meds:  acidophilus  1 capsule Oral Daily   cholecalciferol  1,000 Units Oral QHS   finasteride  5 mg Oral Daily   FLUoxetine  20 mg Oral Daily   loratadine  10 mg Oral Daily   memantine  14 mg Oral QODAY   midodrine  5 mg Oral TID AC   vitamin B-12  500 mcg Oral QHS   Continuous Infusions:  sodium chloride        LOS: 3 days    Time spent: 30 mins I spent 30 minutes face-to-face encounter with patient, wife at bedside, explained diagnosis, plan, answered all  the questions.   Shawna Clamp, MD [facility]   If 7PM-7AM, please contact night-coverage

## 2022-04-21 DIAGNOSIS — R55 Syncope and collapse: Secondary | ICD-10-CM | POA: Diagnosis not present

## 2022-04-21 LAB — CREATININE, SERUM
Creatinine, Ser: 1.28 mg/dL — ABNORMAL HIGH (ref 0.61–1.24)
GFR, Estimated: 55 mL/min — ABNORMAL LOW (ref >60)

## 2022-04-21 MED ORDER — SODIUM CHLORIDE 0.9 % IV SOLN: INTRAVENOUS | Status: AC

## 2022-04-21 MED ORDER — ZINC OXIDE 40 % EX OINT
TOPICAL_OINTMENT | CUTANEOUS | Status: DC | PRN
  Filled 2022-04-21: qty 113

## 2022-04-21 NOTE — Progress Notes (Signed)
PROGRESS NOTE    Dylan [name]  [id number]:[id number] DOB: 1934/07/18 DOA: 04/16/2022  PCP: [name], MD   Brief Narrative: This 88 years old male with PMH significant for dementia, hyperlipidemia, atrial flutter on Eliquis, CKD stage IIIa, BPH, orthostatic hypotension, thrombocytopenia, PAD, history of C. difficile, hard of hearing presented in the ED with recurrent syncope. Patient has history of syncope currently being monitored by cardiologist.  Patient has a loop recorder placed.  Patient had 2 episodes of syncope on morning of admission.  While syncope patient fell and injured his head.  Patient does not have any numbness, weakness,  no facial droop or slurred speech.  Patient also denies chest pain or shortness of breath.  CT head negative, CT C-spine negative for any acute injury.  EKG shows bifascicular block,  ventricular bigeminy.  Cardiology is consulted thinks the syncope is multifactorial.  Cardio signed off.  Assessment & Plan:   Principal Problem:   Syncope Active Problems:   Orthostatic hypotension   Paroxysmal atrial flutter (HCC)   Dyslipidemia   Thrombocytopenia (HCC)   BPH (benign prostatic hyperplasia)   Dementia, senile with depression (HCC)   Chronic kidney disease, stage 3a (HCC)  Syncope, Recurrent: Patient presented with recurrent syncope and fall.  Imaging negative for acute injury. Etiology is not clear.  It could be multifactorial due to vasovagal syncope, cardiac arrhythmias and orthostatic hypotension.   There is no neurofocal deficit on physical exam.  TIA or stroke is unlikely.   Patient denies any chest pain, Trop x negative so there is low suspicion for ACS. EKG shows ventricular bigeminy and bifascicular block. Patient is wearing loop recorder,  is monitored by cardiologist. Cardiology is consulted,  recommended to increase fluid intake,  continue midodrine. Echo showed perserved EF.  No significant arrhythmias noted on telemetry. Cardio  recommended to hold Eliquis for now given more risks than benefits. Resumption of anticoagulation can be addressed at primary care cardiologist visit. No additional cardiac testing indicated at this point.  Cardiology signed off.  Orthostatic hypotension: Continue midodrine, continue gentle IV hydration.  Paroxysmal atrial fibrillation: Hold Eliquis.  Heart rate is controlled. Patient has recurrent syncope and falls,  there is high risk of injury. Resumption of anticoagulation will be addressed at primary care cardiologist visit.  AKI on CKD stage IIIa: Baseline serum creatinine 1.2, presented with creatinine 1.5 on admission. Renal functions improved, Avoid nephrotoxic medications. Continue IV gentle hydration.  Dementia / Depression: Continue Namenda and Prozac.  BPH: Continue finasteride 5 mg daily..  Thrombocytopenia: Patient does have chronic thrombocytopenia, Baseline platelet count 86.  Unclear etiology.  Dyslipidemia Patient not taking any anticholesterol medications. LDL 140, obtain Lipid profile.  Generalised weakness/deconditioning :  PT recommended SNF,  awaiting insurance authorization   DVT prophylaxis:  SCDs Code Status: DNR Family Communication: Wife at bed side. Disposition Plan:   Status is: Inpatient Remains inpatient appropriate because:    Admitted for recurrent syncope,  EKG shows ventricular bigeminy and trifascicular block. Cardiology is consulted, no plans for any cardiac testing at this time. PT recommended SNF,  awaiting insurance authorization.   Consultants:  Cardiology  Procedures: Echocardiogram  Antimicrobials: None.  Subjective: Patient was seen and examined at bedside.  Overnight events noted. Patient reports feeling better.  He was having breakfast.  He seems pleasant. Patient does have baseline dementia and is hard of hearing. Patient reports he participated in physical therapy yesterday and his blood pressure slightly  dropped.   Objective: Vitals:  04/20/22 0900 04/20/22 2101 04/21/22 0457 04/21/22 0833  BP: (!) 100/53 (!) 116/50 (!) 111/52 (!) 106/50  Pulse: 67 64 (!) 55 (!) 56  Resp: '18 16 16 16  '$ Temp: 98.5 F (36.9 C) (!) 97.3 F (36.3 C) (!) 97.3 F (36.3 C) 97.7 F (36.5 C)  TempSrc:  Oral Oral Oral  SpO2: 99% 94% 92% 97%  Weight:      Height:       No intake or output data in the 24 hours ending 04/21/22 0934  Filed Weights   04/16/22 0655  Weight: 84.8 kg    Examination:  General exam: Appears comfortable, deconditioned, not in any acute distress. Respiratory system: CTA bilaterally, no accessory muscle use, normal respiratory effort. Cardiovascular system: S1-S2 heard, regular rate and rhythm, no murmur. Gastrointestinal system: Abdomen is soft, non tender, non distended, BS+ Central nervous system: Alert and oriented x 2, no focal neurological deficits. Extremities: No edema, no cyanosis, no clubbing. Skin: No rashes, lesions or ulcers Psychiatry: Mood & affect appropriate.     Data Reviewed: I have personally reviewed following labs and imaging studies  CBC: Recent Labs  Lab 04/16/22 0920 04/19/22 0638 04/20/22 0608  WBC 7.9 6.1 6.1  NEUTROABS 6.2  --   --   HGB 15.5 14.1 14.5  HCT 45.2 40.9 42.3  MCV 90.8 90.3 91.0  PLT 86* 81* 83*   Basic Metabolic Panel: Recent Labs  Lab 04/16/22 0920 04/17/22 0732 04/20/22 0608 04/21/22 0546  NA 139 139 141  --   K 4.5 4.1 3.9  --   CL 106 106 108  --   CO2 '29 28 28  '$ --   GLUCOSE 99 90 92  --   BUN '17 14 19  '$ --   CREATININE 1.50* 1.22 1.35* 1.28*  CALCIUM 9.6 9.3 9.2  --    GFR: Estimated Creatinine Clearance: 46.8 mL/min (A) (by C-G formula based on SCr of 1.28 mg/dL (H)). Liver Function Tests: No results for input(s): "AST", "ALT", "ALKPHOS", "BILITOT", "PROT", "ALBUMIN" in the last 168 hours. No results for input(s): "LIPASE", "AMYLASE" in the last 168 hours. No results for input(s): "AMMONIA" in the last  168 hours. Coagulation Profile: No results for input(s): "INR", "PROTIME" in the last 168 hours. Cardiac Enzymes: Recent Labs  Lab 04/16/22 1100  CKTOTAL 52   BNP (last 3 results) No results for input(s): "PROBNP" in the last 8760 hours. HbA1C: No results for input(s): "HGBA1C" in the last 72 hours. CBG: No results for input(s): "GLUCAP" in the last 168 hours. Lipid Profile: No results for input(s): "CHOL", "HDL", "LDLCALC", "TRIG", "CHOLHDL", "LDLDIRECT" in the last 72 hours. Thyroid Function Tests: No results for input(s): "TSH", "T4TOTAL", "FREET4", "T3FREE", "THYROIDAB" in the last 72 hours. Anemia Panel: No results for input(s): "VITAMINB12", "FOLATE", "FERRITIN", "TIBC", "IRON", "RETICCTPCT" in the last 72 hours. Sepsis Labs: No results for input(s): "PROCALCITON", "LATICACIDVEN" in the last 168 hours.  No results found for this or any previous visit (from the past 240 hour(s)).   Radiology Studies: No results found.   Scheduled Meds:  acidophilus  1 capsule Oral Daily   cholecalciferol  1,000 Units Oral QHS   finasteride  5 mg Oral Daily   FLUoxetine  20 mg Oral Daily   loratadine  10 mg Oral Daily   memantine  14 mg Oral QODAY   midodrine  5 mg Oral TID AC   vitamin B-12  500 mcg Oral QHS   Continuous Infusions:  sodium chloride  LOS: 4 days   Time spent: 30 mins I spent 30 minutes face-to-face encounter with patient, wife at bedside, explained diagnosis, plan, answered all the questions.   Shawna Clamp, MD [facility]   If 7PM-7AM, please contact night-coverage

## 2022-04-22 LAB — CUP PACEART REMOTE DEVICE CHECK
Date Time Interrogation Session: 20230610232321
Implantable Pulse Generator Implant Date: 20191120

## 2022-04-22 LAB — LIPID PANEL
Cholesterol: 168 mg/dL (ref 0–200)
HDL: 30 mg/dL — ABNORMAL LOW (ref >40)
LDL Cholesterol: 109 mg/dL — ABNORMAL HIGH (ref 0–99)
Total CHOL/HDL Ratio: 5.6 RATIO
Triglycerides: 146 mg/dL (ref <150)
VLDL: 29 mg/dL (ref 0–40)

## 2022-04-22 LAB — BASIC METABOLIC PANEL
Anion gap: 6 (ref 5–15)
BUN: 21 mg/dL (ref 8–23)
CO2: 25 mmol/L (ref 22–32)
Calcium: 9 mg/dL (ref 8.9–10.3)
Chloride: 108 mmol/L (ref 98–111)
Creatinine, Ser: 1.14 mg/dL (ref 0.61–1.24)
GFR, Estimated: >60 mL/min (ref >60)
Glucose, Bld: 86 mg/dL (ref 70–99)
Potassium: 3.8 mmol/L (ref 3.5–5.1)
Sodium: 139 mmol/L (ref 135–145)

## 2022-04-22 LAB — PLATELET COUNT: Platelets: 86 10*3/uL — ABNORMAL LOW (ref 150–400)

## 2022-04-22 LAB — PHOSPHORUS: Phosphorus: 3 mg/dL (ref 2.5–4.6)

## 2022-04-22 LAB — MAGNESIUM: Magnesium: 2 mg/dL (ref 1.7–2.4)

## 2022-04-22 MED ORDER — POLYETHYLENE GLYCOL 3350 17 GM/SCOOP PO POWD
1.0000 | Freq: Every day | ORAL | Status: DC
  Filled 2022-04-22: qty 255

## 2022-04-22 MED ORDER — POLYETHYLENE GLYCOL 3350 17 G PO PACK
17.0000 g | PACK | Freq: Every day | ORAL | Status: DC
  Administered 2022-04-22 – 2022-04-23 (×2): 17 g via ORAL
  Filled 2022-04-22 (×2): qty 1

## 2022-04-22 NOTE — Progress Notes (Signed)
OT Cancellation Note  Patient Details Name: ASIA FAVATA MRN: [id number] DOB: Sep 01, 1934   Cancelled Treatment:    Reason Eval/Treat Not Completed: Other (comment).  Upon attempt, spouse in room and noting pt is in the middle of having a bowel movement but having a difficult time and uncomfortable. Per wife's request, notified RN regarding medication to help. Also sent message to MD and TOC per wife's request for an update on SNF bed search. Will re-attempt OT tx next date.   Ardeth Perfect., MPH, MS, OTR/L ascom [phone] 04/22/22, 3:56 PM

## 2022-04-22 NOTE — Progress Notes (Signed)
Physical Therapy Treatment Patient Details Name: Dylan [name] MRN: [id number] DOB: [birth year] Today's Date: 04/22/2022   History of Present Illness Pt is an 88 yo male that presented to the ED for syncopal episodes. PMH of afib, CKD, dementia, orthostatic hypotension, s/p ILR.    PT Comments    Pt seen for PT tx with pt's wife (Dylan [name]) present for session. Pt is able to transfer sidelying<>sitting EOB with mod I with cuing to initiate. Transfers & gait deferred 2/2 HR dropping with mobility. Pt engaged in BLE strengthening exercises while supine with tactile & verbal cuing for proper technique. Notified nurse & MD of pt's HR during session.  HR supine: 69 bpm Sitting EOB 32 bpm Returned supine & HR increased to 56 bpm, does drop back down to 35 bpm but fluctuates up to 54 bpm Pt without c/o adverse symptoms during session    Recommendations for follow up therapy are one component of a multi-disciplinary discharge planning process, led by the attending physician.  Recommendations may be updated based on patient status, additional functional criteria and insurance authorization.  Follow Up Recommendations  Skilled nursing-short term rehab (<3 hours/day)     Assistance Recommended at Discharge Frequent or constant Supervision/Assistance  Patient can return home with the following Assistance with cooking/housework;Assist for transportation;Help with stairs or ramp for entrance;Direct supervision/assist for medications management;A little help with walking and/or transfers;A little help with bathing/dressing/bathroom   Equipment Recommendations   (TBD in next venue)    Recommendations for Other Services       Precautions / Restrictions Precautions Precautions: Fall Restrictions Weight Bearing Restrictions: No     Mobility  Bed Mobility Overal bed mobility: Needs Assistance Bed Mobility: Sidelying to Sit, Sit to Sidelying   Sidelying to sit: Modified independent  (Device/Increase time)     Sit to sidelying: Modified independent (Device/Increase time) General bed mobility comments: cuing to initiate    Transfers Overall transfer level:  (deferred 2/2 HR)                      Ambulation/Gait                   Stairs             Wheelchair Mobility    Modified Rankin (Stroke Patients Only)       Balance Overall balance assessment: Needs assistance Sitting-balance support: Feet supported Sitting balance-Leahy Scale: Good                                      Cognition Arousal/Alertness: Awake/alert Behavior During Therapy: WFL for tasks assessed/performed Overall Cognitive Status: History of cognitive impairments - at baseline                                 General Comments: Pt with hx of dementia, able to recognize wife in room but unable to recall his correct age (baseline for him per wife).        Exercises General Exercises - Lower Extremity Hip ABduction/ADduction: AROM, Strengthening, Both, 10 reps, Supine (hip abduction slides x 10 & hip adduction pillow squeezes x 10) Straight Leg Raises: AROM, Strengthening, Supine, Both, 10 reps    General Comments        Pertinent Vitals/Pain Pain Assessment Pain Assessment: Faces Faces Pain Scale: No hurt  Home Living                          Prior Function            PT Goals (current goals can now be found in the care plan section) Acute Rehab PT Goals Patient Stated Goal: to be safe at discharge PT Goal Formulation: With family Time For Goal Achievement: 05/01/22 Potential to Achieve Goals: Fair Progress towards PT goals: Progressing toward goals    Frequency    Min 2X/week      PT Plan Current plan remains appropriate    Co-evaluation              AM-PAC PT "6 Clicks" Mobility   Outcome Measure  Help needed turning from your back to your side while in a flat bed without using  bedrails?: None Help needed moving from lying on your back to sitting on the side of a flat bed without using bedrails?: None Help needed moving to and from a bed to a chair (including a wheelchair)?: A Little Help needed standing up from a chair using your arms (e.g., wheelchair or bedside chair)?: A Little Help needed to walk in hospital room?: A Little Help needed climbing 3-5 steps with a railing? : A Little 6 Click Score: 20    End of Session   Activity Tolerance: Treatment limited secondary to medical complications (Comment) Patient left: in bed;with call bell/phone within reach;with bed alarm set;with family/visitor present Nurse Communication:  (notified nurse & MD of HR during session) PT Visit Diagnosis: Other abnormalities of gait and mobility (R26.89);Difficulty in walking, not elsewhere classified (R26.2);Muscle weakness (generalized) (M62.81)     Time: 8921-1941 PT Time Calculation (min) (ACUTE ONLY): 10 min  Charges:  $Therapeutic Activity: 8-22 mins                     Lavone Nian, PT, DPT 04/22/22, 11:51 AM  [name] 04/22/2022, 11:50 AM

## 2022-04-22 NOTE — Progress Notes (Signed)
PROGRESS NOTE    Dylan [name]  WUX:[id number] DOB: 1934-01-03 DOA: 04/16/2022  PCP: [name], MD   Brief Narrative: This 88 years old male with PMH significant for dementia, hyperlipidemia, atrial flutter on Eliquis, CKD stage IIIa, BPH, orthostatic hypotension, thrombocytopenia, PAD, history of C. difficile, hard of hearing presented in the ED with recurrent syncope. Patient has history of syncope currently being monitored by cardiologist.  Patient has a loop recorder placed.  Patient had 2 episodes of syncope on morning of admission.  While syncope patient fell and injured his head.  Patient does not have any numbness, weakness,  no facial droop or slurred speech.  Patient also denies chest pain or shortness of breath.  CT head negative, CT C-spine negative for any acute injury.  EKG shows bifascicular block,  ventricular bigeminy.  Cardiology is consulted thinks the syncope is multifactorial.  Cardio signed off.  Assessment & Plan:   Principal Problem:   Syncope Active Problems:   Orthostatic hypotension   Paroxysmal atrial flutter (HCC)   Dyslipidemia   Thrombocytopenia (HCC)   BPH (benign prostatic hyperplasia)   Dementia, senile with depression (HCC)   Chronic kidney disease, stage 3a (HCC)  Syncope, Recurrent: Patient presented with recurrent syncope and fall.  Imaging negative for acute injury. Etiology is not clear.  It could be multifactorial due to vasovagal syncope, cardiac arrhythmias and orthostatic hypotension.   There is no neurofocal deficit on physical exam.  TIA or stroke is unlikely.   Patient denies any chest pain, Trop x negative so there is low suspicion for ACS. EKG shows ventricular bigeminy and bifascicular block. Patient is wearing loop recorder,  is monitored by cardiologist. Cardiology is consulted,  recommended to increase fluid intake,  continue midodrine. Echo showed perserved EF.  No significant arrhythmias noted on telemetry. Cardio  recommended to hold Eliquis for now given more risks than benefits. Resumption of anticoagulation can be addressed at primary care cardiologist visit. No additional cardiac testing indicated at this point.  Cardiology signed off.  Orthostatic hypotension: Continue midodrine, continue gentle IV hydration. Patient HR dropped during PT session to 32 from 67, patient denies any symptoms. PT session stopped.  Paroxysmal atrial fibrillation: Hold Eliquis.  Heart rate is controlled. Patient has recurrent syncope and falls,  there is high risk of injury. Resumption of anticoagulation will be addressed at primary care cardiologist visit.  AKI on CKD stage IIIa: Baseline serum creatinine 1.2, presented with creatinine 1.5 on admission. Renal functions improved, Avoid nephrotoxic medications. Continue IV gentle hydration.  Dementia / Depression: Continue Namenda and Prozac.  BPH: Continue finasteride 5 mg daily..  Thrombocytopenia: Patient does have chronic thrombocytopenia, Baseline platelet count 86.  Unclear etiology.  Dyslipidemia Patient not taking any anticholesterol medications. LDL 140, obtain Lipid profile.  Generalised weakness/deconditioning :  PT recommended SNF,  awaiting insurance authorization   DVT prophylaxis:  SCDs Code Status: DNR Family Communication: Wife at bed side. Disposition Plan:   Status is: Inpatient Remains inpatient appropriate because:    Admitted for recurrent syncope,  EKG shows ventricular bigeminy and trifascicular block. Cardiology is consulted, no plans for any cardiac testing at this time. PT recommended SNF,  awaiting insurance authorization.   Consultants:  Cardiology  Procedures: Echocardiogram  Antimicrobials: None.  Subjective: Patient was seen and examined at bedside.  Overnight events noted. Patient reports feeling better he was lying comfortably having breakfast.  Appears pleasant. Patient does have baseline dementia and  is hard of hearing. Patient's heart rate  has dropped while doing physical therapy. PT session has stopped in between.   Objective: Vitals:   04/21/22 1533 04/21/22 2021 04/22/22 0413 04/22/22 0811  BP: (!) 104/44 106/64 115/64 127/66  Pulse: (!) 57 (!) 41 (!) 55 (!) 52  Resp: '20 20 18 16  '$ Temp: 98.2 F (36.8 C) 97.7 F (36.5 C) 97.9 F (36.6 C) (!) 97.3 F (36.3 C)  TempSrc: Oral     SpO2: 98% 95% 100% 98%  Weight:      Height:        Intake/Output Summary (Last 24 hours) at 04/22/2022 1348 Last data filed at 04/21/2022 1508 Gross per 24 hour  Intake 377.31 ml  Output --  Net 377.31 ml    Filed Weights   04/16/22 0655  Weight: 84.8 kg    Examination:  General exam: Appears comfortable, deconditioned, not in any acute distress. Respiratory system: CTA bilaterally, no accessory muscle use, normal respiratory effort. Cardiovascular system: S1-S2 heard, regular rate and rhythm, no murmur. Gastrointestinal system: Abdomen is soft, non tender, non distended, BS+ Central nervous system: Alert and oriented x 2, no focal neurological deficits. Extremities: No edema, no cyanosis, no clubbing. Skin: No rashes, lesions or ulcers Psychiatry: Mood & affect appropriate.     Data Reviewed: I have personally reviewed following labs and imaging studies  CBC: Recent Labs  Lab 04/16/22 0920 04/19/22 0638 04/20/22 0608 04/22/22 0600  WBC 7.9 6.1 6.1  --   NEUTROABS 6.2  --   --   --   HGB 15.5 14.1 14.5  --   HCT 45.2 40.9 42.3  --   MCV 90.8 90.3 91.0  --   PLT 86* 81* 83* 86*   Basic Metabolic Panel: Recent Labs  Lab 04/16/22 0920 04/17/22 0732 04/20/22 0608 04/21/22 0546 04/22/22 0600  NA 139 139 141  --  139  K 4.5 4.1 3.9  --  3.8  CL 106 106 108  --  108  CO2 '29 28 28  '$ --  25  GLUCOSE 99 90 92  --  86  BUN '17 14 19  '$ --  21  CREATININE 1.50* 1.22 1.35* 1.28* 1.14  CALCIUM 9.6 9.3 9.2  --  9.0  MG  --   --   --   --  2.0  PHOS  --   --   --   --  3.0    GFR: Estimated Creatinine Clearance: 52.6 mL/min (by C-G formula based on SCr of 1.14 mg/dL). Liver Function Tests: No results for input(s): "AST", "ALT", "ALKPHOS", "BILITOT", "PROT", "ALBUMIN" in the last 168 hours. No results for input(s): "LIPASE", "AMYLASE" in the last 168 hours. No results for input(s): "AMMONIA" in the last 168 hours. Coagulation Profile: No results for input(s): "INR", "PROTIME" in the last 168 hours. Cardiac Enzymes: Recent Labs  Lab 04/16/22 1100  CKTOTAL 52   BNP (last 3 results) No results for input(s): "PROBNP" in the last 8760 hours. HbA1C: No results for input(s): "HGBA1C" in the last 72 hours. CBG: No results for input(s): "GLUCAP" in the last 168 hours. Lipid Profile: Recent Labs    04/22/22 0600  CHOL 168  HDL 30*  LDLCALC 109*  TRIG 146  CHOLHDL 5.6   Thyroid Function Tests: No results for input(s): "TSH", "T4TOTAL", "FREET4", "T3FREE", "THYROIDAB" in the last 72 hours. Anemia Panel: No results for input(s): "VITAMINB12", "FOLATE", "FERRITIN", "TIBC", "IRON", "RETICCTPCT" in the last 72 hours. Sepsis Labs: No results for input(s): "PROCALCITON", "LATICACIDVEN" in the  last 168 hours.  No results found for this or any previous visit (from the past 240 hour(s)).   Radiology Studies: CUP PACEART REMOTE DEVICE CHECK  Result Date: 04/22/2022 ILR summary report received. Battery status OK. Normal device function. No new symptom,  or pause episodes. No new AF episodes. Monthly summary reports and ROV/PRN 3 nocturnal brady events, SB with bigeminal PVC's, 8-37sec in duration.   AF burden 0.5%, no EGM's LA    Scheduled Meds:  acidophilus  1 capsule Oral Daily   cholecalciferol  1,000 Units Oral QHS   finasteride  5 mg Oral Daily   FLUoxetine  20 mg Oral Daily   loratadine  10 mg Oral Daily   memantine  14 mg Oral QODAY   midodrine  5 mg Oral TID AC   vitamin B-12  500 mcg Oral QHS   Continuous Infusions:    LOS: 5 days   Time  spent: 30 mins I spent 30 minutes face-to-face encounter with patient, wife at bedside, explained diagnosis, plan, answered all the questions.   Shawna Clamp, MD [facility]   If 7PM-7AM, please contact night-coverage

## 2022-04-23 MED ORDER — MIDODRINE HCL 10 MG PO TABS: 10.0000 mg | ORAL_TABLET | Freq: Three times a day (TID) | ORAL | 1 refills | Status: DC

## 2022-04-23 MED ORDER — MIDODRINE HCL 5 MG PO TABS
10.0000 mg | ORAL_TABLET | Freq: Three times a day (TID) | ORAL | Status: DC
  Administered 2022-04-23: 10 mg via ORAL
  Filled 2022-04-23: qty 2

## 2022-04-23 NOTE — Progress Notes (Signed)
Progress Note  Patient Name: Dylan [name] Date of Encounter: 04/23/2022  [facility] Cardiologist: Quay Burow, MD   Subjective   Patient seen on AM rounds. Dementia is his baseline. He smiles and nods and on occasion will answer questions. His wife remains at the bedside and states that he is back to his usual self. Over night there have been some concerns for lower heart rates during vital signs checks. Patient has been asymptomatic to findings. On review of previous telemetry he had had an increased amount of PVC's in the past which could account for lower heart rate by pulse ox as they would not be counted.   Inpatient Medications    Scheduled Meds:  acidophilus  1 capsule Oral Daily   cholecalciferol  1,000 Units Oral QHS   finasteride  5 mg Oral Daily   FLUoxetine  20 mg Oral Daily   loratadine  10 mg Oral Daily   memantine  14 mg Oral QODAY   midodrine  10 mg Oral TID AC   polyethylene glycol  17 g Oral Daily   vitamin B-12  500 mcg Oral QHS   Continuous Infusions:  PRN Meds: acetaminophen, guaiFENesin-dextromethorphan, liver oil-zinc oxide, ondansetron (ZOFRAN) IV, senna   Vital Signs    Vitals:   04/22/22 2132 04/23/22 0600 04/23/22 1008 04/23/22 1009  BP: (!) 119/56 (!) 106/52 100/60 (!) 103/44  Pulse: (!) 59 (!) 45 72 (!) 40  Resp:  '18 16 16  '$ Temp:  97.8 F (36.6 C) 98 F (36.7 C) 98 F (36.7 C)  TempSrc:  Oral Axillary Axillary  SpO2:  (!) 88% 100% 100%  Weight:      Height:       No intake or output data in the 24 hours ending 04/23/22 1123    04/16/2022    6:55 AM 03/25/2022   12:36 PM 03/16/2022   11:31 PM  Last 3 Weights  Weight (lbs) 187 lb 172 lb 6 oz 175 lb  Weight (kg) 84.823 kg 78.189 kg 79.379 kg      Telemetry    No new tracings - Personally Reviewed  ECG    No new tracings- Personally Reviewed  Physical Exam   GEN: No acute distress. Sitting upright in bed eating breakfast. Neck: No JVD appreciated Cardiac: Irregularly  regular with history of PVC's, no murmurs, rubs, or gallops.  Respiratory: Clear to auscultation bilaterally. Respirations are unlabored on room air.  GI: Soft, nontender, non-distended  MS: No edema; No deformity. Neuro:  Nonfocal  Psych: baseline of dementia  Labs    High Sensitivity Troponin:   Recent Labs  Lab 04/16/22 0920  TROPONINIHS 7     Chemistry Recent Labs  Lab 04/17/22 0732 04/20/22 0608 04/21/22 0546 04/22/22 0600  NA 139 141  --  139  K 4.1 3.9  --  3.8  CL 106 108  --  108  CO2 28 28  --  25  GLUCOSE 90 92  --  86  BUN 14 19  --  21  CREATININE 1.22 1.35* 1.28* 1.14  CALCIUM 9.3 9.2  --  9.0  MG  --   --   --  2.0  GFRNONAA 58* 51* 55* >60  ANIONGAP 5 5  --  6    Lipids  Recent Labs  Lab 04/22/22 0600  CHOL 168  TRIG 146  HDL 30*  LDLCALC 109*  CHOLHDL 5.6    Hematology Recent Labs  Lab 04/19/22 [phone] 04/20/22  2426 04/22/22 0600  WBC 6.1 6.1  --   RBC 4.53 4.65  --   HGB 14.1 14.5  --   HCT 40.9 42.3  --   MCV 90.3 91.0  --   MCH 31.1 31.2  --   MCHC 34.5 34.3  --   RDW 13.1 13.1  --   PLT 81* 83* 86*   Thyroid No results for input(s): "TSH", "FREET4" in the last 168 hours.  BNPNo results for input(s): "BNP", "PROBNP" in the last 168 hours.  DDimer No results for input(s): "DDIMER" in the last 168 hours.   Radiology    No results found.  Cardiac Studies  Echocardiogram completed on 11/08/2021 1. Left ventricular ejection fraction, by estimation, is 55 to 60%. Left  ventricular ejection fraction by 2D MOD biplane is 59.7 %. The left  ventricle has normal function. The left ventricle has no regional wall  motion abnormalities. Left ventricular  diastolic parameters were normal.   2. Right ventricular systolic function is normal. The right ventricular  size is normal.   3. The mitral valve is normal in structure. Mild mitral valve  regurgitation.   4. The aortic valve was not well visualized. Aortic valve regurgitation  is  trivial.   5. The inferior vena cava is normal in size with greater than 50%  respiratory variability, suggesting right atrial pressure of 3 mmHg.   Patient Profile     88 y.o. male with a history of recurrent syncope, orthostatic hypotension, PAD, RBBB, CKD III, and baseline of dementia.   Assessment & Plan    Syncope with collapse -Head/cervical CT with no abnormal findings -Linq interrogation completed on admission with no findings supportive of syncope  -no clear cause, likely multifactorial with long standing history of dehydration, orthostatics, vasovagal  2.Orthostatic hypotension -increased midrine to 10 mg tid -orthostatic vital signs daily -increase fluid intake  3.CKD III -creatinine 1.14 -on admission 1.50, improved likely secondary to dehydration -increase fluid intake  4. Paroxysmal atrial fibrillation -linq interrogation revealed 12.5% AT/AF burden -with continued falls recommendation made to remain off of eliquis until follow up with primary cardiologist  -concerned that risks outweigh benefits -currently in SR with PVC's, there was concern for low heart rates, patient has been off of telemetry so pulse ox and blood pressure cuff were unable to count PVC's for accurate heart rates, at this time it is not recommended to start any beta blocker therapy due to the patient having a long standing history of falls and orthostatic hypotension     For questions or updates, please contact [facility] Please consult [email or web address] for contact info under        Signed, Loribeth Katich, NP  04/23/2022, 11:23 AM

## 2022-04-23 NOTE — Care Management Important Message (Signed)
Important Message  Patient Details  Name: Dylan [name] MRN: [id number] Date of Birth: [birth year]   Medicare Important Message Given:  Yes  I reviewed the Important Message from Medicare with pt's HCPOA, [name] Vantine, wife and she is agreement with the discharge.  I asked if she would like a copy and she said it wasn't necessary. I wished him well and thanked her for her time.   Juliann Pulse A Makari Sanko 04/23/2022, 10:39 AM

## 2022-04-23 NOTE — Discharge Instructions (Signed)
Advised to follow-up with primary care physician in 1 week. Advised to follow-up with cardiology as scheduled. Advised to take midodrine 10 mg 3 times daily for orthostatic hypotension. Eliquis is discontinued due to risk of recurrent falls and will be addressed as primary care cardiologist visit

## 2022-04-23 NOTE — TOC Progression Note (Addendum)
Transition of Care [facility]) - Progression Note    Patient Details  Name: REGINO FOURNET MRN: [id number] Date of Birth: Sep 23, 1934  Transition of Care [facility]) CM/SW [place], LCSW Phone Number: 04/23/2022, 10:10 AM  Clinical Narrative:   Tommi Rumps with [facility] accepted referral. PT, OT, and RN. MD notified to place orders. CSW notified spouse. BSC ordered through [employer]. Spouse states pt already has a walker.     Expected Discharge Plan: [place] Barriers to Discharge: Continued Medical Work up  Expected Discharge Plan and Services Expected Discharge Plan: [place] In-house Referral: NA   Post Acute Care Choice: [place] Living arrangements for the past 2 months: Single Family Home                                       Social Determinants of Health (SDOH) Interventions    Readmission Risk Interventions     No data to display

## 2022-04-23 NOTE — Progress Notes (Signed)
Patient discharged home. IV removed. Went over discharge instructions and medications with patients wife. All questions answered. BSC was delivered. Patient going home POV with wife.

## 2022-04-23 NOTE — Discharge Summary (Signed)
Physician Discharge Summary  Dylan [name] YQM:[id number] DOB: 27-Jan-1934 DOA: 04/16/2022  PCP: [name], MD  Admit date: 04/16/2022  Discharge date: 04/23/2022  Admitted From: Home  Disposition:  Home health services.  Recommendations for Outpatient Follow-up:  Follow up with PCP in 1-2 weeks. Please obtain BMP/CBC in one week. Advised to follow-up with cardiology as scheduled. Advised to take midodrine 10 mg 3 times daily for orthostatic hypotension. Eliquis is discontinued due to risk of recurrent falls and will be addressed as primary care cardiologist visit.  [facility] / OT / RN Equipment/Devices: None  Discharge Condition: Stable CODE STATUS: DNR Diet recommendation: Heart Healthy   Brief Summary/ Hospital Course: This 88 years old male with PMH significant for dementia, hyperlipidemia, atrial flutter on Eliquis, CKD stage IIIa, BPH, orthostatic hypotension, thrombocytopenia, PAD, history of C. difficile, hard of hearing presented in the ED with recurrent syncope. Patient has history of syncope currently being monitored by cardiologist. Patient has a loop recorder placed.  Patient had 2 episodes of syncope on morning of admission.  While syncope patient fell and injured his head.  Patient does not have any numbness, weakness,  no facial droop or slurred speech.  Patient also denies chest pain or shortness of breath.  CT head negative, CT C-spine negative for any acute injury.  EKG shows bifascicular block,  ventricular bigeminy.  Cardiology was consulted thinks the syncope is multifactorial.  No inpatient cardiac testing needed.  Eliquis was discontinued given risk of recurrent falls and syncope.  Cardiology suggests continuation of Eliquis will be discussed at primary care cardiologist visit.  Cardio signed off.  Home health services been arranged.  Patient's midodrine dose was increased to 10 mg 3 times daily.  Patient is being discharged home.  Discharge  Diagnoses:  Principal Problem:   Syncope Active Problems:   Orthostatic hypotension   Paroxysmal atrial flutter (HCC)   Dyslipidemia   Thrombocytopenia (HCC)   BPH (benign prostatic hyperplasia)   Dementia, senile with depression ([place])   Chronic kidney disease, stage 3a (HCC)  Syncope, Recurrent: Patient presented with recurrent syncope and fall.  Imaging negative for acute injury. Etiology is not clear.  It could be multifactorial due to vasovagal syncope, cardiac arrhythmias and orthostatic hypotension.   There was no neurofocal deficit on physical exam.  TIA or stroke is unlikely.   Patient denies any chest pain, Trop x negative so there is low suspicion for ACS. EKG shows ventricular bigeminy and bifascicular block. Patient is wearing loop recorder,  is monitored by cardiologist. Cardiology was consulted,  recommended to increase fluid intake,  continue midodrine. Echo showed perserved EF.  No significant arrhythmias noted on telemetry. Cardio recommended to hold Eliquis for now given more risks than benefits. Resumption of anticoagulation can be addressed at primary care cardiologist visit. No additional cardiac testing indicated at this point.  Cardiology signed off.   Orthostatic hypotension: Continue midodrine. Midodrine dose increased to 10 mg 3 times daily.   Paroxysmal atrial fibrillation: Hold Eliquis.  Heart rate is controlled. Patient has recurrent syncope and falls,  there is high risk of injury. Resumption of anticoagulation will be addressed at primary care cardiologist visit.   AKI on CKD stage IIIa: Baseline serum creatinine 1.2, presented with creatinine 1.5 on admission. Renal functions improved, Avoid nephrotoxic medications.   Dementia / Depression: Continue Namenda and Prozac.   BPH: Continue finasteride 5 mg daily..   Thrombocytopenia: Patient does have chronic thrombocytopenia, Baseline platelet count 86.  Unclear etiology.    Dyslipidemia Patient not taking any anticholesterol medications. LDL 140, obtain Lipid profile.   Generalised weakness/deconditioning :  PT recommended SNF, home health services been arranged.  Discharge Instructions  Discharge Instructions     Call MD for:  difficulty breathing, headache or visual disturbances   Complete by: As directed    Call MD for:  persistant dizziness or light-headedness   Complete by: As directed    Call MD for:  persistant nausea and vomiting   Complete by: As directed    Diet - low sodium heart healthy   Complete by: As directed    Diet Carb Modified   Complete by: As directed    Discharge instructions   Complete by: As directed    Advised to follow-up with primary care physician in 1 week. Advised to follow-up with cardiology as scheduled. Advised to take midodrine 10 mg 3 times daily for orthostatic hypotension. Eliquis is discontinued due to risk of recurrent falls and will be addressed as primary care cardiologist visit   Increase activity slowly   Complete by: As directed       Allergies as of 04/23/2022       Reactions   Doxepin Other (See Comments)   Lethargy, overly sedating at low dose   Lincomycin Hcl Rash        Medication List     STOP taking these medications    Eliquis 5 MG Tabs tablet Generic drug: apixaban       TAKE these medications    ALIGN PO Take by mouth daily.   feeding supplement Liqd Take 237 mLs by mouth daily as needed (about 3 times a week).   fexofenadine 180 MG tablet Commonly known as: Allegra Allergy Take 1 tablet (180 mg total) by mouth daily.   finasteride 5 MG tablet Commonly known as: PROSCAR Take 1 tablet (5 mg total) by mouth daily.   FLUoxetine 20 MG capsule Commonly known as: PROZAC Take 1 capsule (20 mg total) by mouth daily.   guaiFENesin-dextromethorphan 100-10 MG/5ML syrup Commonly known as: ROBITUSSIN DM Take 10 mLs by mouth every 4 (four) hours as needed for cough.    memantine 14 MG Cp24 24 hr capsule Commonly known as: NAMENDA XR Take 1 capsule (14 mg total) by mouth every other day.   midodrine 10 MG tablet Commonly known as: PROAMATINE Take 1 tablet (10 mg total) by mouth 3 (three) times daily before meals. What changed:  medication strength how much to take when to take this   MIRALAX PO Take by mouth daily.   SENOKOT PO Take 1 tablet by mouth daily.   vitamin B-12 500 MCG tablet Commonly known as: V-R VITAMIN B-12 Take 1 tablet (500 mcg total) by mouth daily.   Vitamin D3 25 MCG (1000 UT) Caps Take 1 capsule (1,000 Units total) by mouth daily.               Durable Medical Equipment  (From admission, onward)           Start     Ordered   04/23/22 1041  For home use only DME Bedside commode  Once       Question:  Patient needs a bedside commode to treat with the following condition  Answer:  Generalized weakness   04/23/22 1041            Follow-up Information     [name], MD. Go on 05/03/2022.   Specialty: Family Medicine  Why: '@12'$ :00pm Contact information: [place] [address] [phone]         Constance Haw, MD. Go on 04/25/2022.   Specialty: Cardiology Why: '@3'$ :10 w/Ursuy, Joseph Art, PA Contact information: [address] [place] [address] [phone]         Lorretta Harp, MD. Go on 04/30/2022.   Specialties: Cardiology, Radiology Why: '@1'$ :55pm Contact information: [address] [place] [address] [phone]                Allergies  Allergen Reactions   Doxepin Other (See Comments)    Lethargy, overly sedating at low dose   Lincomycin Hcl Rash    Consultations: Cardiology   Procedures/Studies: CUP PACEART REMOTE DEVICE CHECK  Result Date: 04/22/2022 ILR summary report received. Battery status OK. Normal device function. No new symptom,  or pause episodes. No new AF episodes. Monthly summary  reports and ROV/PRN 3 nocturnal brady events, SB with bigeminal PVC's, 8-37sec in duration.   AF burden 0.5%, no EGM's LA  CT Head Wo Contrast  Result Date: 04/16/2022 CLINICAL DATA:  Head trauma.  Fell while getting out of bed. EXAM: CT HEAD WITHOUT CONTRAST CT CERVICAL SPINE WITHOUT CONTRAST TECHNIQUE: Multidetector CT imaging of the head and cervical spine was performed following the standard protocol without intravenous contrast. Multiplanar CT image reconstructions of the cervical spine were also generated. RADIATION DOSE REDUCTION: This exam was performed according to the departmental dose-optimization program which includes automated exposure control, adjustment of the mA and/or kV according to patient size and/or use of iterative reconstruction technique. COMPARISON:  CT head 03/17/2022 FINDINGS: CT HEAD FINDINGS Brain: No evidence of acute infarction, hemorrhage, hydrocephalus, extra-axial collection or mass lesion/mass effect. Moderate severity cerebral and cerebellar atrophy with increase extra-axial spaces and ventricular dilatation. Unchanged. There is mild diffuse low-attenuation within the periventricular white matter compatible with chronic microvascular disease. Vascular: No hyperdense vessel or unexpected calcification. Skull: Normal. Negative for fracture or focal lesion. Sinuses/Orbits: No acute finding. Other: None. CT CERVICAL SPINE FINDINGS Alignment: No acute posttraumatic malalignment of the cervical spine. Skull base and vertebrae: No acute fracture. No primary bone lesion or focal pathologic process. Soft tissues and spinal canal: No prevertebral fluid or swelling. No visible canal hematoma. Disc levels: There is marked disc space narrowing and endplate spurring identified at C6-7. Upper chest: Negative. Other: None IMPRESSION: 1. No acute intracranial abnormality. 2. Chronic small vessel ischemic disease and cerebral atrophy. 3. No evidence for cervical spine fracture or subluxation.  4. Cervical degenerative disc disease. Electronically Signed   By: Kerby Moors M.D.   On: 04/16/2022 07:56   CT Cervical Spine Wo Contrast  Result Date: 04/16/2022 CLINICAL DATA:  Head trauma.  Fell while getting out of bed. EXAM: CT HEAD WITHOUT CONTRAST CT CERVICAL SPINE WITHOUT CONTRAST TECHNIQUE: Multidetector CT imaging of the head and cervical spine was performed following the standard protocol without intravenous contrast. Multiplanar CT image reconstructions of the cervical spine were also generated. RADIATION DOSE REDUCTION: This exam was performed according to the departmental dose-optimization program which includes automated exposure control, adjustment of the mA and/or kV according to patient size and/or use of iterative reconstruction technique. COMPARISON:  CT head 03/17/2022 FINDINGS: CT HEAD FINDINGS Brain: No evidence of acute infarction, hemorrhage, hydrocephalus, extra-axial collection or mass lesion/mass effect. Moderate severity cerebral and cerebellar atrophy with increase extra-axial spaces and ventricular dilatation. Unchanged. There is mild diffuse low-attenuation within the periventricular white  matter compatible with chronic microvascular disease. Vascular: No hyperdense vessel or unexpected calcification. Skull: Normal. Negative for fracture or focal lesion. Sinuses/Orbits: No acute finding. Other: None. CT CERVICAL SPINE FINDINGS Alignment: No acute posttraumatic malalignment of the cervical spine. Skull base and vertebrae: No acute fracture. No primary bone lesion or focal pathologic process. Soft tissues and spinal canal: No prevertebral fluid or swelling. No visible canal hematoma. Disc levels: There is marked disc space narrowing and endplate spurring identified at C6-7. Upper chest: Negative. Other: None IMPRESSION: 1. No acute intracranial abnormality. 2. Chronic small vessel ischemic disease and cerebral atrophy. 3. No evidence for cervical spine fracture or subluxation. 4.  Cervical degenerative disc disease. Electronically Signed   By: Kerby Moors M.D.   On: 04/16/2022 07:56      Subjective: Patient was seen and examined at bedside.  Overnight events noted. Patient reports feeling much improved.  Patient is being discharged home.  Home health services been arranged.  Discharge Exam: Vitals:   04/23/22 1137 04/23/22 1138  BP:    Pulse: (!) 56 64  Resp:    Temp:    SpO2:     Vitals:   04/23/22 1009 04/23/22 1136 04/23/22 1137 04/23/22 1138  BP: (!) 103/44 111/64    Pulse: (!) 40 (!) 36 (!) 56 64  Resp: 16 20    Temp: 98 F (36.7 C)     TempSrc: Axillary     SpO2: 100% 100%    Weight:      Height:        General: Pt is alert, awake, not in acute distress Cardiovascular: RRR, S1/S2 +, no rubs, no gallops Respiratory: CTA bilaterally, no wheezing, no rhonchi Abdominal: Soft, NT, ND, bowel sounds + Extremities: no edema, no cyanosis    The results of significant diagnostics from this hospitalization (including imaging, microbiology, ancillary and laboratory) are listed below for reference.     Microbiology: No results found for this or any previous visit (from the past 240 hour(s)).   Labs: BNP (last 3 results) Recent Labs    03/17/22 0506  BNP 932.6*   Basic Metabolic Panel: Recent Labs  Lab 04/17/22 0732 04/20/22 0608 04/21/22 0546 04/22/22 0600  NA 139 141  --  139  K 4.1 3.9  --  3.8  CL 106 108  --  108  CO2 28 28  --  25  GLUCOSE 90 92  --  86  BUN 14 19  --  21  CREATININE 1.22 1.35* 1.28* 1.14  CALCIUM 9.3 9.2  --  9.0  MG  --   --   --  2.0  PHOS  --   --   --  3.0   Liver Function Tests: No results for input(s): "AST", "ALT", "ALKPHOS", "BILITOT", "PROT", "ALBUMIN" in the last 168 hours. No results for input(s): "LIPASE", "AMYLASE" in the last 168 hours. No results for input(s): "AMMONIA" in the last 168 hours. CBC: Recent Labs  Lab 04/19/22 0638 04/20/22 0608 04/22/22 0600  WBC 6.1 6.1  --   HGB  14.1 14.5  --   HCT 40.9 42.3  --   MCV 90.3 91.0  --   PLT 81* 83* 86*   Cardiac Enzymes: No results for input(s): "CKTOTAL", "CKMB", "CKMBINDEX", "TROPONINI" in the last 168 hours.  BNP: Invalid input(s): "POCBNP" CBG: No results for input(s): "GLUCAP" in the last 168 hours. D-Dimer No results for input(s): "DDIMER" in the last 72 hours. Hgb A1c No results for  input(s): "HGBA1C" in the last 72 hours. Lipid Profile Recent Labs    04/22/22 0600  CHOL 168  HDL 30*  LDLCALC 109*  TRIG 146  CHOLHDL 5.6   Thyroid function studies No results for input(s): "TSH", "T4TOTAL", "T3FREE", "THYROIDAB" in the last 72 hours.  Invalid input(s): "FREET3" Anemia work up No results for input(s): "VITAMINB12", "FOLATE", "FERRITIN", "TIBC", "IRON", "RETICCTPCT" in the last 72 hours. Urinalysis    Component Value Date/Time   COLORURINE YELLOW (A) 03/16/2022 2250   APPEARANCEUR CLEAR (A) 03/16/2022 2250   LABSPEC 1.025 03/16/2022 2250   PHURINE 5.0 03/16/2022 2250   GLUCOSEU NEGATIVE 03/16/2022 2250   HGBUR SMALL (A) 03/16/2022 2250   BILIRUBINUR NEGATIVE 03/16/2022 2250   BILIRUBINUR 1+ 06/05/2021 1635   KETONESUR NEGATIVE 03/16/2022 2250   PROTEINUR 30 (A) 03/16/2022 2250   UROBILINOGEN 0.2 06/05/2021 1635   NITRITE NEGATIVE 03/16/2022 2250   LEUKOCYTESUR NEGATIVE 03/16/2022 2250   Sepsis Labs Recent Labs  Lab 04/19/22 0638 04/20/22 0608  WBC 6.1 6.1   Microbiology No results found for this or any previous visit (from the past 240 hour(s)).   Time coordinating discharge: Over 30 minutes  SIGNED:   Shawna Clamp, MD  [facility] 04/23/2022, 2:19 PM Pager   If 7PM-7AM, please contact night-coverage

## 2022-04-24 ENCOUNTER — Telehealth: Payer: Self-pay

## 2022-04-24 DIAGNOSIS — I951 Orthostatic hypotension: Secondary | ICD-10-CM | POA: Diagnosis not present

## 2022-04-24 DIAGNOSIS — F0393 Unspecified dementia, unspecified severity, with mood disturbance: Secondary | ICD-10-CM | POA: Diagnosis not present

## 2022-04-24 DIAGNOSIS — I480 Paroxysmal atrial fibrillation: Secondary | ICD-10-CM | POA: Diagnosis not present

## 2022-04-24 DIAGNOSIS — I4892 Unspecified atrial flutter: Secondary | ICD-10-CM | POA: Diagnosis not present

## 2022-04-24 DIAGNOSIS — F32A Depression, unspecified: Secondary | ICD-10-CM | POA: Diagnosis not present

## 2022-04-24 NOTE — Telephone Encounter (Signed)
Dian call back [phone]  Needs to talk to her when they call back Needs vital permanetes heart rate has been dropping into the 30's  Requesting nursing  1 week 4 weeks

## 2022-04-24 NOTE — Telephone Encounter (Signed)
I assume she is requesting HR parameters?  Pt has history of bigeminy so pulse ox can record falsely low pulse.  Recommend record pulse manually and contact MD if pulse <40.   Agree with SN orders as per above.

## 2022-04-24 NOTE — Telephone Encounter (Signed)
Left message on voicemail for Dylan [name] to call the office back.

## 2022-04-25 ENCOUNTER — Ambulatory Visit: Payer: Medicare Other | Admitting: Physician Assistant

## 2022-04-25 ENCOUNTER — Encounter: Payer: Self-pay | Admitting: Physician Assistant

## 2022-04-25 ENCOUNTER — Telehealth: Payer: Self-pay

## 2022-04-25 VITALS — BP 164/70 | HR 55 | Ht 73.0 in | Wt 174.4 lb

## 2022-04-25 DIAGNOSIS — I951 Orthostatic hypotension: Secondary | ICD-10-CM

## 2022-04-25 DIAGNOSIS — I4892 Unspecified atrial flutter: Secondary | ICD-10-CM | POA: Diagnosis not present

## 2022-04-25 DIAGNOSIS — I480 Paroxysmal atrial fibrillation: Secondary | ICD-10-CM

## 2022-04-25 DIAGNOSIS — F32A Depression, unspecified: Secondary | ICD-10-CM | POA: Diagnosis not present

## 2022-04-25 DIAGNOSIS — Z4509 Encounter for adjustment and management of other cardiac device: Secondary | ICD-10-CM

## 2022-04-25 DIAGNOSIS — F0393 Unspecified dementia, unspecified severity, with mood disturbance: Secondary | ICD-10-CM | POA: Diagnosis not present

## 2022-04-25 NOTE — Progress Notes (Signed)
Cardiology Office Note Date:  04/25/2022  Patient ID:  Dylan [name], Dylan [name] 01-01-1934, MRN [id number] PCP:  [name], MD  Cardiologist:  Dr. Bettina Gavia EP: Dr. Curt Bears   Chief Complaint:  hospital follow up  History of Present Illness: Dylan [name] is a 88 y.o. male with history of HLD, dementia, CKD (III), RBBB and syncope > ILR implant  He was admitted to [facility] 05/31/20 with acute onset of worsening generalized weakness with associated mild dyspnea. In the ER, VSS WBC 23.1, lactic acid 3.9, mild bump in Creat 1.59 (baselin 1.18) Started on IFV and antibiotics Suspect pneumonia Discharged on PO antibiotics and started on midodrine as well Discharged 06/02/20.  He comes today to be seen for Dr. Curt Bears, last seen by him 09/03/21.  He had a recent hospitalization for C-Diff, no cardiac issues, remained on midodrine for his orthostatic hypotension, no recurrent syncope. No changes were made.      09/25/21, ILR noted Aflutter w/CVR and planned to come in to discuss Nora.  I saw him Nov 2022 He is accompanied by his wife. He has known baseline dementia and defers to his wife for answers.  She mentions he sleep poorly and tends to wander during the night He though denies any kind oc CP or SOB Today's BP is described as great for him He has not had recurrent syncope He will sometimes stumble because he likes his shoes tied loose, though does not fall.  His wife tries to keep them tied tight, No hx of bleeding or hematology history New onset AFlutter, started on a/c, he was rate controlled and unaware, did not want to pursue procedure, planned for a 1 mo f/u and DCCV once a/c appropriately.  (No f/u done)  Hospitalized 03/16/22 - 03/18/22 with c/o weakness, N/V/D, fever, found w/COVID  Hospitalized 04/16/22 - 04/23/22 after a syncopal event (x2), suspect orthostatic as major contributor, cardiology participated in his care, loop was interrogated with no HR/rhythm findings to explain  syncope. He was treated with fluids, and midodrine increased. His Eliquis stopped with concerns of recurrent falls/syncope, traumatic bleeding, this to be re-addressed by primary cardiology team at follow up  He was reported to have HRs to 30's by pulse ox,BP cuff, felt to be 2/2 PVCs  TODAY He comes today accompanied by hs wife Ambulated in. His dementia is advancing, participates little in conversation, answers yes to everything, some yes answers are more enthusiastic though. He appropriately tells me his wife is with him. He has not had any falls or faints since home. His constipation is better back on his home bowel regime He eats very well but does not drink much water at all, his wife does well trying alternatives like watermelon, grapes, and flavored water. She checks his BP prior to getting out of bed and waits until his BP is good enough to let his sit, then stand slowly.  She does not think he will wear thigh sleeves, but would perhaps do  an abdominal binder.  Device information MDT LNQII  Implanted 09/30/2018 for syncope  Past Medical History:  Diagnosis Date   Basal cell carcinoma 08/31/2020   Mid forehead - ED&C    Basal cell carcinoma of right nasal sidewall 12/15/2014   Bilateral hearing loss 08/03/2018   S/p audiology evaluation, has new hearing aides.   Bradycardia    a. 08/2018 ETT: Poor ex tolerance (2:10); b. 09/2018 s/p MDT Linq.   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 09/2018 s/p MDT Linq; b. 10/2021 Echo: EF 55-60%, no rwma, nl RV fxn, mild MR, triv AI.   Thrombocytopenia ([place]) 02/21/2011   periph smear stable 02/2020   Vitamin B12 deficiency 08/03/2018   Completed b12 shots 09/2018, maintaining levels on oral replacement (dissolvable tablets) 01/2019   Vitamin D deficiency 05/19/2018    Past Surgical History:  Procedure Laterality Date   BASAL CELL CARCINOMA EXCISION Right 01/2015   with reconstructive surgery around eye   CATARACT EXTRACTION Left 03/2017   ETT  09/2018   negative for ischemia, extremely poor exercise tolerance ([place])   FOOT SURGERY Left 1998   metatarsal amputation after injury   KNEE ARTHROSCOPY  1993   Right   KNEE ARTHROSCOPY  01/2001   Left   LOOP RECORDER INSERTION N/A 09/30/2018   Procedure: LOOP RECORDER INSERTION;  Surgeon: Constance Haw, MD;  Location: [facility];  Service: Cardiovascular;  Laterality: N/A;    Current Outpatient Medications  Medication Sig Dispense Refill   Cholecalciferol (VITAMIN D3) 1000 units CAPS Take 1 capsule (1,000 Units total) by mouth daily. 30 capsule    feeding supplement (BOOST HIGH PROTEIN) LIQD Take 237 mLs by mouth daily as needed (about 3 times a week).  0   fexofenadine (ALLEGRA ALLERGY) 180 MG tablet Take 1 tablet (180 mg total) by mouth daily.     finasteride (PROSCAR)  5 MG tablet Take 1 tablet (5 mg total) by mouth daily. 90 tablet 3   FLUoxetine (PROZAC) 20 MG capsule Take 1 capsule (20 mg total) by mouth daily. 30 capsule 6   guaiFENesin-dextromethorphan (ROBITUSSIN DM) 100-10 MG/5ML syrup Take 10 mLs by mouth every 4 (four) hours as needed for cough. 118 mL 0   memantine (NAMENDA XR) 14 MG CP24 24 hr capsule Take 1 capsule (14 mg total) by mouth every other day.     midodrine (PROAMATINE) 10 MG tablet Take 1 tablet (10 mg total) by mouth 3 (three) times daily before meals. 90 tablet 1   Polyethylene Glycol 3350 (MIRALAX PO) Take by mouth daily.     Probiotic Product (ALIGN PO) Take by mouth daily.     Sennosides (SENOKOT PO) Take 1 tablet by mouth at bedtime.     vitamin B-12 (V-R VITAMIN B-12) 500 MCG tablet Take 1 tablet (500 mcg total) by mouth daily. (Patient taking differently: Take 500  mcg by mouth at bedtime.)     No current facility-administered medications for this visit.    Allergies:   Doxepin and Lincomycin hcl   Social History:  The patient  reports that he quit smoking about 61 years ago. His smoking use included cigarettes. He has never used smokeless tobacco. He reports that he does not drink alcohol and does not use drugs.   Family History:  The patient's family history includes Cataracts in his sister; Congestive Heart Failure in his father and mother; Leukemia in his brother; Stroke (age of onset: 53) in his father.  ROS:  Please see the history of present illness. All other systems are reviewed and otherwise negative.   PHYSICAL EXAM:  VS:  BP (!) 164/70   Pulse (!) 55   Ht '6\' 1"'$  (1.854 m)   Wt 174 lb 6.4 oz (79.1 kg)   SpO2 98%   BMI 23.01 kg/m  BMI: Body mass index is 23.01 kg/m. Well nourished, well developed, in no acute distress  HEENT: normocephalic, atraumatic  Neck: no JVD, carotid bruits or masses Cardiac: RRR, no significant murmurs, no rubs, or gallops Lungs:   CTA b/l, no wheezing, rhonchi or rales  Abd: soft,  nontender MS: no deformity, age appropriate, perhaps advanced atrophy Ext:  no edema  Skin: warm and dry, no rash Neuro:  No gross deficits appreciated Psych: euthymic mood, full affect   ILR site is stable, no tethering or discomfort   EKG:  not done today  ILR interrogation done today and reviewed by myself: Battery is good R waves 0.88m AF burden is 1.4%, one EGM is currently available and false for PVCs  11/08/2021: TTE  1. Left ventricular ejection fraction, by estimation, is 55 to 60%. Left  ventricular ejection fraction by 2D MOD biplane is 59.7 %. The left  ventricle has normal function. The left ventricle has no regional wall  motion abnormalities. Left ventricular  diastolic parameters were normal.   2. Right ventricular systolic function is normal. The right ventricular  size is normal.   3. The mitral valve is normal in structure. Mild mitral valve  regurgitation.   4. The aortic valve was not well visualized. Aortic valve regurgitation  is trivial.   5. The inferior vena cava is normal in size with greater than 50%  respiratory variability, suggesting right atrial pressure of 3 mmHg.   09/04/2018: ETT Blood pressure demonstrated a normal response to exercise. Clinically and electrically negative for ischemia Extremelly poor exercise tolerance Dr. CCurt Bearscommented:  Poor exercise tolerance.  No evidence of chronotropic incompetence.  Will need Linq monitoring for episode of syncope.   14 day monitor Aug 2019  Indication, syncope   Baseline rhythm, sinus minimum maximum and average rate 44 136 and 64 bpm   Supraventricular ectopy, less than 1% there were frequent 297 runs of APCs longest lasting 11.2 seconds with a rate of 122 bpm the fastest 5 complexes a rate of 169 bpm.  There are no episodes of atrial fibrillation or flutter.   Ventricular ectopy, rare isolated APCs 5 couplets and one triplet less than 1% burden. There were no episodes of sinus node or  AV nodal block There are no symptomatic events Conclusion brief runs of atrial premature contractions   Recent Labs: 08/20/2021: ALT 26 03/17/2022: B Natriuretic Peptide 296.8 03/18/2022: TSH 0.753 04/20/2022: Hemoglobin 14.5 04/22/2022: BUN 21; Creatinine, Ser 1.14; Magnesium 2.0; Platelets 86; Potassium 3.8; Sodium 139  08/20/2021: Direct LDL 140.0 04/22/2022: Cholesterol  168; HDL 30; LDL Cholesterol 109; Total CHOL/HDL Ratio 5.6; Triglycerides 146; VLDL 29   Estimated Creatinine Clearance: 52 mL/min (by C-G formula based on SCr of 1.14 mg/dL).   Wt Readings from Last 3 Encounters:  04/25/22 174 lb 6.4 oz (79.1 kg)  04/16/22 187 lb (84.8 kg)  03/25/22 172 lb 6 oz (78.2 kg)     Other studies reviewed: Additional studies/records reviewed today include: summarized above  ASSESSMENT AND PLAN:  1. Syncope     Baseline conduction system disease, RBBB, LAD     No brady or pause episodes     Orthostatic hypotension has been demonstrated, now on midodrine  Discussed treatment strategies at length His wife is doing a great job Hard with his advancing dementia. She will try the abdominal binder, and instructed not to place when supine/laying down. Will continue to try and encourage hydration Add a few salty foods   2. New Aflutter CHA2DS2Vasc is 2 We revisited stroke risk with Afib and fall/traumatic bleeding risk I agree at least right now, his fall and injury risk is higher His wife is most comfortable as well staying off eliquis.     Disposition: he has an appt to see Dr. Bettina Gavia next month, will keep that    Current medicines are reviewed at length with the patient today.  The patient did not have any concerns regarding medicines.  Venetia Night, PA-C 04/25/2022 3:46 PM     [place] [address] [phone] (office)  [phone] (fax)

## 2022-04-25 NOTE — Telephone Encounter (Signed)
Spoke with [name] informing her Dr. Darnell Level is giving verbal orders for SN.  She verbalizes understanding. Per [name], both HR & BP are decreasing during the day. Says pt's wife is monitoring more often. HR drops into 30s several times daily, even during PT.  She's asking does Dr. Danise Mina want anything in particular done when that happens?  Says pt sees cards today. Are you [Dr. G] more comfortable with cards monitoring for this or you?

## 2022-04-25 NOTE — Patient Instructions (Signed)
Medication Instructions:   Your physician recommends that you continue on your current medications as directed. Please refer to the Current Medication list given to you today.  *If you need a refill on your cardiac medications before your next appointment, please call your pharmacy*   Lab Work: [place]   If you have labs (blood work) drawn today and your tests are completely normal, you will receive your results only by: [place] (if you have MyChart) OR A paper copy in the mail If you have any lab test that is abnormal or we need to change your treatment, we will call you to review the results.   Testing/Procedures: NONE ORDERED  TODAY    Follow-Up: At [facility], you and your health needs are our priority.  As part of our continuing mission to provide you with exceptional heart care, we have created designated Provider Care Teams.  These Care Teams include your primary Cardiologist (physician) and Advanced Practice Providers (APPs -  Physician Assistants and Nurse Practitioners) who all work together to provide you with the care you need, when you need it.  We recommend signing up for the patient portal called "MyChart".  Sign up information is provided on this After Visit Summary.  MyChart is used to connect with patients for Virtual Visits (Telemedicine).  Patients are able to view lab/test results, encounter notes, upcoming appointments, etc.  Non-urgent messages can be sent to your provider as well.   To learn more about what you can do with MyChart, go to [email or web address].    Your next appointment:   3-4 week(s)  The format for your next appointment:   In Person  Provider:   Shirlee More, MD or [facility]   Other Instructions   Important Information About [place]

## 2022-04-25 NOTE — Telephone Encounter (Signed)
Transition Care Management Unsuccessful Follow-up Telephone Call  Date of discharge and from where:  [facility]  04/23/2022  Attempts:  1st Attempt  Reason for unsuccessful TCM follow-up call:  No answer/busy

## 2022-04-25 NOTE — Telephone Encounter (Signed)
Are they checking pulse manually and not relying on pulse ox reading?  Do want cardiology recommendations so glad they're seeing today.

## 2022-04-25 NOTE — Progress Notes (Unsigned)
Cardiology Clinic Note   Patient Name: Dylan [name] Date of Encounter: 04/25/2022  Primary Care Provider:  [name], MD Primary Cardiologist:  Quay Burow, MD  Patient Profile    ***  Past Medical History    Past Medical History:  Diagnosis Date   Basal cell carcinoma 08/31/2020   Mid forehead - ED&C    Basal cell carcinoma of right nasal sidewall 12/15/2014   Bilateral hearing loss 08/03/2018   S/p audiology evaluation, has new hearing aides.   Bradycardia    a. 08/2018 ETT: Poor ex tolerance (2:10); b. 09/2018 s/p MDT Linq.   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   CKD (chronic kidney disease) stage 3, GFR 30-59 ml/min (HCC) 01/13/2019   Clostridium difficile colitis 05/2021   Dementia ([place])    Dysphagia 02/19/2020   ERECTILE DYSFUNCTION, MILD 02/03/2007   Qualifier: Diagnosis of  By: Council Mechanic MD, Hilaria Ota    Eversion of right lacrimal punctum 05/17/2015   Exposure keratopathy, 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   HYDROCELE, LEFT 02/03/2007   Hyperlipidemia 12/1999   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/2014   Mohs defect of eyelid 12/14/2014   Nuclear sclerosis, left 04/01/2017   Obstruction of right lacrimal duct 10/16/2015   PAD (peripheral artery disease) ([place]) 03/21/2020   Abnormal ABI, abnormal arterial duplex 03/2020:  R - moderate RLE disease, abnormal TBI L - WNL, abnormal TBI Referred for vascular consult - rec medical management   Pruritus 02/19/2020   Recurrent syncope 06/22/2018   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   Right leg swelling 02/19/2020   Sepsis ([place]) 05/30/2020   Syncope    a. 09/2018 s/p MDT Linq; b. 10/2021 Echo: EF 55-60%, no rwma, nl RV fxn, mild MR, triv AI.   Thrombocytopenia ([place]) 02/21/2011   periph smear stable 02/2020   Vitamin B12 deficiency 08/03/2018   Completed b12 shots  09/2018, maintaining levels on oral replacement (dissolvable tablets) 01/2019   Vitamin D deficiency 05/19/2018   Past Surgical History:  Procedure Laterality Date   BASAL CELL CARCINOMA EXCISION Right 01/2015   with reconstructive surgery around eye   CATARACT EXTRACTION Left 03/2017   ETT  09/2018   negative for ischemia, extremely poor exercise tolerance ([place])   FOOT SURGERY Left 1998   metatarsal amputation after injury   KNEE ARTHROSCOPY  1993   Right   KNEE ARTHROSCOPY  01/2001   Left   LOOP RECORDER INSERTION N/A 09/30/2018   Procedure: LOOP RECORDER INSERTION;  Surgeon: Constance Haw, MD;  Location: [facility];  Service: Cardiovascular;  Laterality: N/A;    Allergies  Allergies  Allergen Reactions   Doxepin Other (See Comments)    Lethargy, overly sedating at low dose   Lincomycin Hcl Rash    History of Present Illness    ***  Home Medications    Prior to Admission medications   Medication Sig Start Date End Date Taking? Authorizing Provider  Cholecalciferol (VITAMIN D3) 1000 units CAPS Take 1 capsule (1,000 Units total) by mouth daily. 08/03/18   [name], MD  feeding supplement (BOOST HIGH PROTEIN) LIQD Take 237 mLs by mouth daily as needed (about 3 times a week). 01/11/22   [name], MD  fexofenadine [facility] ALLERGY) 180 MG tablet Take 1 tablet (180 mg total) by mouth daily. 02/19/22   [name], MD  finasteride (PROSCAR) 5 MG tablet Take 1 tablet (5 mg total) by mouth daily. 08/27/21   [name], MD  FLUoxetine (PROZAC) 20 MG capsule Take 1 capsule (20 mg total) by mouth daily. 02/19/22   [name], MD  guaiFENesin-dextromethorphan [facility] DM) 100-10 MG/5ML syrup Take 10 mLs by mouth every 4 (four) hours as needed for cough. 03/18/22   Fritzi Mandes, MD  memantine (NAMENDA XR) 14 MG CP24 24 hr capsule Take 1 capsule (14 mg total) by mouth every other day. 02/19/22   [name], MD  midodrine  (PROAMATINE) 10 MG tablet Take 1 tablet (10 mg total) by mouth 3 (three) times daily before meals. 04/23/22   Shawna Clamp, MD  Polyethylene Glycol 3350 (MIRALAX PO) Take by mouth daily.    [provider]  Probiotic Product (ALIGN PO) Take by mouth daily.    [provider]  Sennosides (SENOKOT PO) Take 1 tablet by mouth daily.    [provider]  vitamin B-12 (V-R VITAMIN B-12) 500 MCG tablet Take 1 tablet (500 mcg total) by mouth daily. 08/11/19   [name], MD    Family History    Family History  Problem Relation Age of Onset   Congestive Heart Failure Mother    Stroke Father 31   Congestive Heart Failure Father    Cataracts Sister    Leukemia Brother    He indicated that his mother is deceased. He indicated that his father is deceased. He indicated that his sister is alive. He indicated that two of his three brothers are alive. He indicated that his maternal grandmother is deceased. He indicated that his maternal grandfather is deceased. He indicated that his paternal grandmother is deceased. He indicated that his paternal grandfather is deceased.  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, minerals, Glass blower/designer  Tobacco Use   Smoking status: Former    Types: Cigarettes    Quit date: 11/11/1960    Years since quitting: 61.4   Smokeless tobacco: Never  Vaping Use   Vaping Use: Never used  Substance and Sexual Activity   Alcohol use: No   Drug use: No   Sexual activity: Not on file  Other Topics Concern   Not on file  Social History Narrative   Lives with wife [place])   One stepson   Occ: retired, Furniture conservator/restorer, 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  Cardiovascular:  No chest pain, dyspnea on exertion, edema, orthopnea, palpitations, paroxysmal nocturnal dyspnea. Dermatological: No rash, lesions/masses Respiratory: No cough, dyspnea Urologic: No hematuria, dysuria Abdominal:   No nausea, vomiting, diarrhea, bright red blood per rectum,  melena, or hematemesis Neurologic:  No visual changes, wkns, changes in mental status. All other systems reviewed and are otherwise negative except as noted above.  Physical Exam    VS:  There were no vitals taken for this visit. , BMI There is no height or weight on file to calculate BMI. GEN: Well nourished, well developed, in no acute distress. HEENT: normal. Neck: Supple, no JVD, carotid bruits, or masses. Cardiac: RRR, no murmurs, rubs, or gallops. No clubbing, cyanosis, edema.  Radials/DP/PT 2+ and equal bilaterally.  Respiratory:  Respirations regular and unlabored, clear to auscultation bilaterally. GI: Soft, nontender, nondistended, BS + x 4. MS: no deformity or atrophy. Skin: warm and dry, no rash. Neuro:  Strength and sensation are intact. Psych: Normal affect.  Accessory Clinical Findings    Recent Labs: 08/20/2021: ALT 26 03/17/2022: B Natriuretic Peptide 296.8 03/18/2022: TSH 0.753 04/20/2022: Hemoglobin 14.5 04/22/2022: BUN 21; Creatinine, Ser 1.14; Magnesium 2.0; Platelets 86; Potassium 3.8; Sodium 139   Recent Lipid Panel    Component Value Date/Time   CHOL 168 04/22/2022 0600   TRIG 146 04/22/2022 0600   HDL 30 (L)  04/22/2022 0600   CHOLHDL 5.6 04/22/2022 0600   VLDL 29 04/22/2022 0600   LDLCALC 109 (H) 04/22/2022 0600   LDLDIRECT 140.0 08/20/2021 1117    ECG personally reviewed by me today- *** - No acute changes  Assessment & Plan   1.  ***   Jossie Ng. Emett Stapel NP-C    04/25/2022, 8:05 AM [place] [address] Office [phone] Fax [phone]  Notice: This dictation was prepared with Dragon dictation along with smaller phrase technology. Any transcriptional errors that result from this process are unintentional and may not be corrected upon review.  I spent***minutes examining this patient, reviewing medications, and using patient centered shared decision making involving her cardiac care.  Prior to her visit I spent greater than 20 minutes reviewing her past medical history,  medications, and prior cardiac tests.

## 2022-04-26 ENCOUNTER — Telehealth: Payer: Self-pay | Admitting: Cardiovascular Disease

## 2022-04-26 ENCOUNTER — Encounter: Payer: Self-pay | Admitting: Psychology

## 2022-04-26 ENCOUNTER — Ambulatory Visit (INDEPENDENT_AMBULATORY_CARE_PROVIDER_SITE_OTHER): Payer: Medicare Other | Admitting: Family Medicine

## 2022-04-26 ENCOUNTER — Encounter: Payer: Self-pay | Admitting: Family Medicine

## 2022-04-26 VITALS — BP 118/74 | HR 57 | Temp 97.3°F | Ht 73.0 in | Wt 174.0 lb

## 2022-04-26 DIAGNOSIS — I4892 Unspecified atrial flutter: Secondary | ICD-10-CM | POA: Diagnosis not present

## 2022-04-26 DIAGNOSIS — F03918 Unspecified dementia, unspecified severity, with other behavioral disturbance: Secondary | ICD-10-CM

## 2022-04-26 DIAGNOSIS — R001 Bradycardia, unspecified: Secondary | ICD-10-CM | POA: Diagnosis not present

## 2022-04-26 DIAGNOSIS — N183 Chronic kidney disease, stage 3 unspecified: Secondary | ICD-10-CM

## 2022-04-26 DIAGNOSIS — F66 Other sexual disorders: Secondary | ICD-10-CM

## 2022-04-26 DIAGNOSIS — D696 Thrombocytopenia, unspecified: Secondary | ICD-10-CM | POA: Diagnosis not present

## 2022-04-26 DIAGNOSIS — I452 Bifascicular block: Secondary | ICD-10-CM

## 2022-04-26 DIAGNOSIS — R55 Syncope and collapse: Secondary | ICD-10-CM | POA: Diagnosis not present

## 2022-04-26 LAB — RENAL FUNCTION PANEL
Albumin: 4.2 g/dL (ref 3.5–5.2)
BUN: 25 mg/dL — ABNORMAL HIGH (ref 6–23)
CO2: 28 mEq/L (ref 19–32)
Calcium: 10 mg/dL (ref 8.4–10.5)
Chloride: 102 mEq/L (ref 96–112)
Creatinine, Ser: 1.37 mg/dL (ref 0.40–1.50)
GFR: 46.81 mL/min — ABNORMAL LOW (ref >60.00)
Glucose, Bld: 70 mg/dL (ref 70–99)
Phosphorus: 3.5 mg/dL (ref 2.3–4.6)
Potassium: 4.7 mEq/L (ref 3.5–5.1)
Sodium: 139 mEq/L (ref 135–145)

## 2022-04-26 LAB — CBC WITH DIFFERENTIAL/PLATELET
Basophils Absolute: 0.1 10*3/uL (ref 0.0–0.1)
Basophils Relative: 1.2 % (ref 0.0–3.0)
Eosinophils Absolute: 0.1 10*3/uL (ref 0.0–0.7)
Eosinophils Relative: 1.1 % (ref 0.0–5.0)
HCT: 45.7 % (ref 39.0–52.0)
Hemoglobin: 15.5 g/dL (ref 13.0–17.0)
Lymphocytes Relative: 21.5 % (ref 12.0–46.0)
Lymphs Abs: 1.4 10*3/uL (ref 0.7–4.0)
MCHC: 33.9 g/dL (ref 30.0–36.0)
MCV: 94.3 fl (ref 78.0–100.0)
Monocytes Absolute: 0.8 10*3/uL (ref 0.1–1.0)
Monocytes Relative: 11.9 % (ref 3.0–12.0)
Neutro Abs: 4.2 10*3/uL (ref 1.4–7.7)
Neutrophils Relative %: 64.3 % (ref 43.0–77.0)
Platelets: 122 10*3/uL — ABNORMAL LOW (ref 150.0–400.0)
RBC: 4.84 Mil/uL (ref 4.22–5.81)
RDW: 14.4 % (ref 11.5–15.5)
WBC: 6.5 10*3/uL (ref 4.0–10.5)

## 2022-04-26 NOTE — Assessment & Plan Note (Signed)
Worsening despite changing sertraline to prozac '20mg'$ , tapering Namenda XR to QOD - will fully hold Namenda.  Will refer to neurology for any further recommendations.

## 2022-04-26 NOTE — Telephone Encounter (Signed)
Dylan [name] rtn call.  She states pulse was being checked manually since it was reading low on pulse ox.

## 2022-04-26 NOTE — Telephone Encounter (Signed)
[name] with [facility] called to ask for vital signs parameters

## 2022-04-26 NOTE — Patient Instructions (Signed)
Labs today.  Stop namenda XR for now.  We will refer you to neurology for further recommendations.

## 2022-04-26 NOTE — Telephone Encounter (Signed)
Lvm asking [name] (of [facility]), to call back.  Need to get answer to Dr. Synthia Innocent question and relay his message.

## 2022-04-26 NOTE — Progress Notes (Signed)
Carelink Summary Report / Loop Recorder 

## 2022-04-26 NOTE — Telephone Encounter (Signed)
Transition Care Management Unsuccessful Follow-up Telephone Call  Date of discharge and from where:  [facility] 04/23/2022  Attempts:  2nd Attempt  Reason for unsuccessful TCM follow-up call:  Unable to reach patient

## 2022-04-26 NOTE — Assessment & Plan Note (Signed)
Aflutter found 09/2021, started on eliquis.  Now [facility] has been held due to recurrent syncope and increased fall risk.

## 2022-04-26 NOTE — Assessment & Plan Note (Addendum)
BvFTD, r/o Alzheimer's disease.  Wife is primary caregiver. Intolerance to aricept. Stop Namenda XR.  Will also refer to neuro for evaluation given ongoing/worsening sexual disinhibitions.

## 2022-04-26 NOTE — Assessment & Plan Note (Addendum)
Thought orthostatic syncope, closely followed by cardiology and EP, has f/u appt next week, midodrine dose recently increased to '10mg'$  TID.

## 2022-04-26 NOTE — Assessment & Plan Note (Signed)
Chronic, ?ITP. Update CBC.

## 2022-04-26 NOTE — Progress Notes (Signed)
Patient ID: Dylan [name], male    DOB: 1934-04-17, 88 y.o.   MRN: [id number]  This visit was conducted in person.  BP 118/74   Pulse (!) 57   Temp (!) 97.3 F (36.3 C) (Temporal)   Ht '6\' 1"'$  (1.854 m)   Wt 174 lb (78.9 kg)   SpO2 97%   BMI 22.96 kg/m    CC: 1 mo f/u visit  Subjective:   HPI: Dylan [name] is a 88 y.o. male presenting on 04/26/2022 for Follow-up (Here for 1 mo f/u. Pt accompanied by wife, Maleady. )   Recent hospitalization for recurrent syncope.  Hospital records reviewed. Loop recorder in place, no findings to explain syncope. CT head, c-spine negative for acute injury. Treated with IVF.   Med rec performed. Eliquis was stopped, midodrine increased to '10mg'$  TID. No leg swelling on midodrine.   SNF recommended, but on one would take him due to dementia. Home health was set up [facility]). They have come out to start.  Other follow up appointments scheduled: saw cardiology in follow up yesterday - rec abdominal binder trial, add salty foods. Planned f/u cards Dr Bettina Gavia 05/2022.   Ongoing sexual disinhibition despite changing prozac to '20mg'$  and tapering Namenda XR to QOD.  ______________________________________________________________________ Hospital admission: 04/16/2022 Hospital discharge: 04/23/2022 TCM f/u phone call: attempted x2, unable to reach patient  Disposition:  Home health services.  Discharge Diagnoses:  Principal Problem:   Syncope Active Problems:   Orthostatic hypotension   Paroxysmal atrial flutter (HCC)   Dyslipidemia   Thrombocytopenia (HCC)   BPH (benign prostatic hyperplasia)   Dementia, senile with depression ([place])   Chronic kidney disease, stage 3a ([place])   Recommendations for Outpatient Follow-up:  Follow up with PCP in 1-2 weeks. Please obtain BMP/CBC in one week. Advised to follow-up with cardiology as scheduled. Advised to take midodrine 10 mg 3 times daily for orthostatic hypotension. Eliquis is discontinued due to risk of  recurrent falls and will be addressed as primary care cardiologist visit.   [place] / OT / RN Equipment/Devices: None   Discharge Condition: Stable CODE STATUS: DNR Diet recommendation: Heart Healthy      Relevant past medical, surgical, family and social history reviewed and updated as indicated. Interim medical history since our last visit reviewed. Allergies and medications reviewed and updated. Outpatient Medications Prior to Visit  Medication Sig Dispense Refill   Cholecalciferol (VITAMIN D3) 1000 units CAPS Take 1 capsule (1,000 Units total) by mouth daily. 30 capsule    feeding supplement (BOOST HIGH PROTEIN) LIQD Take 237 mLs by mouth daily as needed (about 3 times a week).  0   fexofenadine (ALLEGRA ALLERGY) 180 MG tablet Take 1 tablet (180 mg total) by mouth daily.     finasteride (PROSCAR) 5 MG tablet Take 1 tablet (5 mg total) by mouth daily. 90 tablet 3   FLUoxetine (PROZAC) 20 MG capsule Take 1 capsule (20 mg total) by mouth daily. 30 capsule 6   guaiFENesin-dextromethorphan (ROBITUSSIN DM) 100-10 MG/5ML syrup Take 10 mLs by mouth every 4 (four) hours as needed for cough. 118 mL 0   memantine (NAMENDA XR) 14 MG CP24 24 hr capsule Take 1 capsule (14 mg total) by mouth every other day.     midodrine (PROAMATINE) 10 MG tablet Take 1 tablet (10 mg total) by mouth 3 (three) times daily before meals. 90 tablet 1   Polyethylene Glycol 3350 (MIRALAX PO) Take by mouth daily.  Probiotic Product (ALIGN PO) Take by mouth daily.     Sennosides (SENOKOT PO) Take 1 tablet by mouth at bedtime.     vitamin B-12 (V-R VITAMIN B-12) 500 MCG tablet Take 1 tablet (500 mcg total) by mouth daily. (Patient taking differently: Take 500 mcg by mouth at bedtime.)     No facility-administered medications prior to visit.     Per HPI unless specifically indicated in ROS section below Review of Systems  Objective:  BP 118/74   Pulse (!) 57   Temp (!) 97.3 F (36.3 C)  (Temporal)   Ht '6\' 1"'$  (1.854 m)   Wt 174 lb (78.9 kg)   SpO2 97%   BMI 22.96 kg/m   Wt Readings from Last 3 Encounters:  04/26/22 174 lb (78.9 kg)  04/25/22 174 lb 6.4 oz (79.1 kg)  04/16/22 187 lb (84.8 kg)      Physical Exam Vitals and nursing note reviewed.  Constitutional:      Appearance: Normal appearance. He is not ill-appearing.  Eyes:     Extraocular Movements: Extraocular movements intact.     Pupils: Pupils are equal, round, and reactive to light.  Cardiovascular:     Rate and Rhythm: Regular rhythm. Bradycardia present.     Pulses: Normal pulses.     Heart sounds: Normal heart sounds. No murmur heard. Pulmonary:     Effort: Pulmonary effort is normal. No respiratory distress.     Breath sounds: Normal breath sounds. No wheezing, rhonchi or rales.  Musculoskeletal:     Right lower leg: No edema.     Left lower leg: No edema.  Skin:    General: Skin is warm and dry.     Findings: No rash.  Neurological:     Mental Status: He is alert.  Psychiatric:        Mood and Affect: Mood normal.        Behavior: Behavior normal.        Assessment & Plan:   Problem List Items Addressed This Visit     Right bundle branch block with left anterior fascicular block   Thrombocytopenia (HCC)    Chronic, ?ITP. Update CBC.       Dementia with behavioral disturbance    BvFTD, r/o Alzheimer's disease.  Wife is primary caregiver. Intolerance to aricept. Stop Namenda XR.  Will also refer to neuro for evaluation given ongoing/worsening sexual disinhibitions.       Relevant Orders   Ambulatory referral to Neurology   Recurrent syncope - Primary    Thought orthostatic syncope, closely followed by cardiology and EP, has f/u appt next week, midodrine dose recently increased to '10mg'$  TID.       Relevant Orders   CBC with Differential/Platelet   Ambulatory referral to Neurology   CKD (chronic kidney disease) stage 3, GFR 30-59 ml/min (HCC)   Relevant Orders   Renal  function panel   Sexual disinhibition    Worsening despite changing sertraline to prozac '20mg'$ , tapering Namenda XR to QOD - will fully hold Namenda.  Will refer to neurology for any further recommendations.       Relevant Orders   Ambulatory referral to Neurology   Bradycardia   Paroxysmal atrial flutter ([place])    Aflutter found 09/2021, started on eliquis.  Now [facility] has been held due to recurrent syncope and increased fall risk.         No orders of the defined types were placed in this encounter.  Orders Placed  This Encounter  Procedures   Renal function panel   CBC with Differential/Platelet   Ambulatory referral to Neurology    Referral Priority:   Routine    Referral Type:   Consultation    Referral Reason:   Specialty Services Required    Requested Specialty:   Neurology    Number of Visits Requested:   1     Patient Instructions  Labs today.  Stop namenda XR for now.  We will refer you to neurology for further recommendations.   Follow up plan: Return if symptoms worsen or fail to improve.  [name], MD

## 2022-04-26 NOTE — Telephone Encounter (Signed)
Spoke with [name] from [facility] regarding vital sign parameters. Pt does typically run low with his blood pressure and heart rate therefore [facility] needs to know at what point they need to give [place] a call. [name] states that without these parameters they would be required to call us with all abnormal valves. Will route to EP and primary cardiologist to advise.

## 2022-04-29 ENCOUNTER — Telehealth: Payer: Self-pay

## 2022-04-29 DIAGNOSIS — F03918 Unspecified dementia, unspecified severity, with other behavioral disturbance: Secondary | ICD-10-CM

## 2022-04-29 DIAGNOSIS — I951 Orthostatic hypotension: Secondary | ICD-10-CM

## 2022-04-29 DIAGNOSIS — F66 Other sexual disorders: Secondary | ICD-10-CM

## 2022-04-29 DIAGNOSIS — I4892 Unspecified atrial flutter: Secondary | ICD-10-CM | POA: Diagnosis not present

## 2022-04-29 DIAGNOSIS — I480 Paroxysmal atrial fibrillation: Secondary | ICD-10-CM | POA: Diagnosis not present

## 2022-04-29 DIAGNOSIS — F32A Depression, unspecified: Secondary | ICD-10-CM | POA: Diagnosis not present

## 2022-04-29 DIAGNOSIS — F0393 Unspecified dementia, unspecified severity, with mood disturbance: Secondary | ICD-10-CM | POA: Diagnosis not present

## 2022-04-29 NOTE — Progress Notes (Unsigned)
Office Visit    Patient Name: Dylan [name] Date of Encounter: 04/30/2022  Primary Care Provider:  [name], MD Primary Cardiologist:  Quay Burow, MD Primary Electrophysiologist: Will [name], MD  Chief Complaint    Dylan [name] is a 88 y.o. male with PMH of PAD, RBBB, paroxysmal atrial flutter CKD stage III, dementia, orthostatic hypotension who presents today for hospital follow-up of syncope.  Past Medical History    Past Medical History:  Diagnosis Date   Basal cell carcinoma 08/31/2020   Mid forehead - ED&C    Basal cell carcinoma of right nasal sidewall 12/15/2014   Bilateral hearing loss 08/03/2018   S/p audiology evaluation, has new hearing aides.   Bradycardia    a. 08/2018 ETT: Poor ex tolerance (2:10); b. 09/2018 s/p MDT Linq.   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 09/2018 s/p MDT Linq; b. 10/2021 Echo: EF 55-60%, no rwma, nl RV fxn, mild MR, triv AI.   Thrombocytopenia ([place]) 02/21/2011   periph smear stable 02/2020   Vitamin B12 deficiency 08/03/2018   Completed b12 shots 09/2018, maintaining levels on oral replacement (dissolvable tablets) 01/2019   Vitamin D deficiency 05/19/2018   Past Surgical History:  Procedure Laterality Date   BASAL CELL CARCINOMA EXCISION Right 01/2015   with reconstructive surgery around eye   CATARACT EXTRACTION Left 03/2017   ETT  09/2018   negative for ischemia, extremely poor exercise tolerance ([place])   FOOT SURGERY Left 1998   metatarsal amputation after injury   KNEE ARTHROSCOPY  1993   Right   KNEE ARTHROSCOPY  01/2001   Left   LOOP RECORDER INSERTION N/A 09/30/2018   Procedure: LOOP RECORDER INSERTION;  Surgeon: Constance Haw, MD;  Location: [facility];  Service: Cardiovascular;  Laterality: N/A;    Allergies  Allergies  Allergen Reactions   Doxepin Other (See Comments)    Lethargy, overly sedating at low dose   Lincomycin Hcl Rash    History of Present Illness    Dylan [name] is a 88 year old male with the above-mentioned past medical history who presents today for posthospital follow-up of syncope.  He presented to [facility] via EMS on 04/16/2022 following 2 episodes of syncope that morning.  Patient had mechanical fall and injured his head. CT performed in the  ED was negative and C-spine was also negative for acute injury.  EKG revealed bifascicular block with ventricular bigeminy.    Dylan [name] has been followed by Dr. Curt Bears since 2022 for management of PAF and longstanding history of orthostasis.  Patient has ILR that was placed in 2019 for cryptogenic stroke that revealed no significant events but histogram show periods of bradycardia with PVCs.  His midodrine was increased to 10 mg 3 times daily and Eliquis held due to bleeding  risk.  He was seen on 04/25/2022 by [name], PA who agreed with remaining off of Eliquis due to increased fall risk.   Since last being seen in the office patient reports that his syncope has decreased and he has not experienced any dizziness with standing.  His wife notes that he does have some dizziness in the early morning prior to taking his midodrine but this resolves following his dose.  His blood pressure today was 126/72 with a heart rate of 54.  Patient's blood pressure readings at home are consistent with readings in office today.  He is wearing a waist belt for blood pressure support and to augment hypertension.  Patient denies chest pain, palpitations, dyspnea, PND, orthopnea, nausea, vomiting, dizziness, syncope, edema, weight gain, or early satiety.  Home Medications    Current Outpatient Medications  Medication Sig Dispense Refill   Cholecalciferol (VITAMIN D3) 1000 units CAPS Take 1 capsule (1,000 Units total) by mouth daily. 30 capsule    feeding supplement (BOOST HIGH PROTEIN) LIQD Take 237 mLs by mouth daily as needed (about 3 times a week).  0   fexofenadine (ALLEGRA ALLERGY) 180 MG tablet Take 1 tablet (180 mg total) by mouth daily.     finasteride (PROSCAR) 5 MG tablet Take 1 tablet (5 mg total) by mouth daily. 90 tablet 3   FLUoxetine (PROZAC) 20 MG capsule Take 1 capsule (20 mg total) by mouth daily. 30 capsule 6   guaiFENesin-dextromethorphan (ROBITUSSIN DM) 100-10 MG/5ML syrup Take 10 mLs by mouth every 4 (four) hours as needed for cough. 118 mL 0   midodrine (PROAMATINE) 10 MG tablet Take 1 tablet (10 mg total) by mouth 3 (three) times daily before meals. 90 tablet 1   Polyethylene Glycol 3350 (MIRALAX PO) Take by mouth daily.     Probiotic Product (ALIGN PO) Take by mouth daily.     Sennosides (SENOKOT PO) Take 1 tablet by mouth at bedtime.     vitamin B-12 (V-R VITAMIN B-12) 500 MCG tablet Take 1 tablet (500 mcg total) by mouth daily. (Patient taking differently:  Take 500 mcg by mouth at bedtime.)     No current facility-administered medications for this visit.     Review of Systems  Please see the history of present illness.    (+) Some a.m. dizziness  All other systems reviewed and are otherwise negative except as noted above.  Physical Exam    Wt Readings from Last 3 Encounters:  04/30/22 176 lb (79.8 kg)  04/26/22 174 lb (78.9 kg)  04/25/22 174 lb 6.4 oz (79.1 kg)   VS: Vitals:   04/30/22 1326  BP: 126/72  Pulse: (!) 54  SpO2: 98%  ,Body mass index is 23.22 kg/m.  Constitutional:      Appearance: Healthy appearance. Not in distress.  Neck:     Vascular: JVD normal.  Pulmonary:     Effort: Pulmonary effort is normal.     Breath sounds: No wheezing. No rales. Diminished in the bases Cardiovascular:  Normal rate. Regular rhythm. Normal S1. Normal S2.      Murmurs: There is no murmur.  Edema:    Peripheral edema absent.  Abdominal:     Palpations: Abdomen is soft non tender. There is no hepatomegaly.  Skin:    General: Skin is warm and dry.  Neurological:     General: No focal deficit present.     Mental Status: Alert and oriented to person, place and time.     Cranial Nerves: Cranial nerves are intact.  EKG/LABS/Other Studies Reviewed    ECG personally reviewed by me today -none completed today  Risk Assessment/Calculations:    CHA2DS2-VASc Score = 3   This indicates a 3.2% annual risk of stroke. The patient's score is based upon:   Lab Results  Component Value Date   WBC 6.5 04/26/2022   HGB 15.5 04/26/2022   HCT 45.7 04/26/2022   MCV 94.3 04/26/2022   PLT 122.0 (L) 04/26/2022   Lab Results  Component Value Date   CREATININE 1.37 04/26/2022   BUN 25 (H) 04/26/2022   NA 139 04/26/2022   K 4.7 04/26/2022   CL 102 04/26/2022   CO2 28 04/26/2022   Lab Results  Component Value Date   ALT 26 08/20/2021   AST 26 08/20/2021   ALKPHOS 82 08/20/2021   BILITOT 0.6 08/20/2021   Lab Results  Component  Value Date   CHOL 168 04/22/2022   HDL 30 (L) 04/22/2022   LDLCALC 109 (H) 04/22/2022   LDLDIRECT 140.0 08/20/2021   TRIG 146 04/22/2022   CHOLHDL 5.6 04/22/2022    No results found for: "HGBA1C"  Assessment & Plan    1.  Syncope and collapse: -Patient denies any increased syncope or collapse since his hospitalization was 2 weeks ago. -He is wearing an abdominal binder and his wife is encouraging p.o. intake to help augment his hypotension. -ILR in place with most recent interrogation during hospitalization -Continue to hydrate with water throughout the day as tolerated  2.  Orthostatic hypotension : -Continue midodrine -Continue wearing abdominal binder  3.  Chronic kidney disease stage III: -Renal function panel completed with creatinine of 1.37 and BUN of 25 -Followed by PCP encourage increase hydration as noted above  4.  Paroxysmal atrial flutter: -Eliquis 5 mg will be discontinued due to patient's increased risk of fall and previous head trauma due to hypotension. -A shared decision was completed with the patient's wife and review of CHA2DS2-VASc and stroke risk was discussed at length with agreement to discontinue Eliquis -CHA2DS2-VASc Score = 3 [CHF History: 0, HTN History: 0, Diabetes History: 0, Stroke History: 0, Vascular Disease History: 1, Age Score: 2, Gender Score: 0].  Therefore, the patient's annual risk of stroke is 3.2 %.      5.  Dementia: -Advancing continue treatment plan per PCP  Disposition: Follow-up with Quay Burow, MD or APP in 12 months    Medication Adjustments/Labs and Tests Ordered: Current medicines are reviewed at length with the patient today.  Concerns regarding medicines are outlined above.   Signed, Mable Fill, Marissa Nestle, NP 04/30/2022, 2:13 PM [place]

## 2022-04-29 NOTE — Telephone Encounter (Signed)
April form [facility] is calling in stating since Dylan [name] coming off of memory medicine, he has not been sleeping very well. Would like recommendations of OTC or if something can be prescribed.

## 2022-04-30 ENCOUNTER — Ambulatory Visit: Payer: Medicare Other | Admitting: Nurse Practitioner

## 2022-04-30 ENCOUNTER — Encounter: Payer: Self-pay | Admitting: General Practice

## 2022-04-30 VITALS — BP 126/72 | HR 54 | Ht 73.0 in | Wt 176.0 lb

## 2022-04-30 DIAGNOSIS — I951 Orthostatic hypotension: Secondary | ICD-10-CM | POA: Diagnosis not present

## 2022-04-30 DIAGNOSIS — I4892 Unspecified atrial flutter: Secondary | ICD-10-CM

## 2022-04-30 DIAGNOSIS — N183 Chronic kidney disease, stage 3 unspecified: Secondary | ICD-10-CM

## 2022-04-30 DIAGNOSIS — F03918 Unspecified dementia, unspecified severity, with other behavioral disturbance: Secondary | ICD-10-CM | POA: Diagnosis not present

## 2022-04-30 DIAGNOSIS — R55 Syncope and collapse: Secondary | ICD-10-CM

## 2022-04-30 NOTE — Telephone Encounter (Signed)
Left message for Dylan [name] from Dylan [name] to call us back.

## 2022-04-30 NOTE — Patient Instructions (Signed)
Medication Instructions:  The current medical regimen is effective;  continue present plan and medications as directed. Please refer to the Current Medication list given to you today.   *If you need a refill on your cardiac medications before your next appointment, please call your pharmacy*  Lab Work:   Testing/Procedures:  NONE    NON If you have labs (blood work) drawn today and your tests are completely normal, you will receive your results only by: MyChart Message (if you have MyChart) OR  A paper copy in the mail If you have any lab test that is abnormal or we need to change your treatment, we will call you to review the results.  Follow-Up: Your next appointment:  12 month(s) In Person with Shirlee More, MD    Please call our office 2 months in advance to schedule this appointment   At [facility], you and your health needs are our priority.  As part of our continuing mission to provide you with exceptional heart care, we have created designated Provider Care Teams.  These Care Teams include your primary Cardiologist (physician) and Advanced Practice Providers (APPs -  Physician Assistants and Nurse Practitioners) who all work together to provide you with the care you need, when you need it.  Important Information About Sugar

## 2022-05-01 DIAGNOSIS — I951 Orthostatic hypotension: Secondary | ICD-10-CM | POA: Diagnosis not present

## 2022-05-01 DIAGNOSIS — I4892 Unspecified atrial flutter: Secondary | ICD-10-CM | POA: Diagnosis not present

## 2022-05-01 DIAGNOSIS — I480 Paroxysmal atrial fibrillation: Secondary | ICD-10-CM | POA: Diagnosis not present

## 2022-05-01 DIAGNOSIS — F32A Depression, unspecified: Secondary | ICD-10-CM | POA: Diagnosis not present

## 2022-05-01 DIAGNOSIS — F0393 Unspecified dementia, unspecified severity, with mood disturbance: Secondary | ICD-10-CM | POA: Diagnosis not present

## 2022-05-03 ENCOUNTER — Inpatient Hospital Stay: Payer: Medicare Other | Admitting: Family Medicine

## 2022-05-03 ENCOUNTER — Telehealth: Payer: Self-pay

## 2022-05-03 DIAGNOSIS — Z85828 Personal history of other malignant neoplasm of skin: Secondary | ICD-10-CM

## 2022-05-03 DIAGNOSIS — I951 Orthostatic hypotension: Secondary | ICD-10-CM | POA: Diagnosis not present

## 2022-05-03 DIAGNOSIS — E538 Deficiency of other specified B group vitamins: Secondary | ICD-10-CM

## 2022-05-03 DIAGNOSIS — F0393 Unspecified dementia, unspecified severity, with mood disturbance: Secondary | ICD-10-CM | POA: Diagnosis not present

## 2022-05-03 DIAGNOSIS — E785 Hyperlipidemia, unspecified: Secondary | ICD-10-CM

## 2022-05-03 DIAGNOSIS — N1831 Chronic kidney disease, stage 3a: Secondary | ICD-10-CM | POA: Diagnosis not present

## 2022-05-03 DIAGNOSIS — Z9181 History of falling: Secondary | ICD-10-CM

## 2022-05-03 DIAGNOSIS — F32A Depression, unspecified: Secondary | ICD-10-CM | POA: Diagnosis not present

## 2022-05-03 DIAGNOSIS — R1310 Dysphagia, unspecified: Secondary | ICD-10-CM | POA: Diagnosis not present

## 2022-05-03 DIAGNOSIS — I452 Bifascicular block: Secondary | ICD-10-CM

## 2022-05-03 DIAGNOSIS — N400 Enlarged prostate without lower urinary tract symptoms: Secondary | ICD-10-CM

## 2022-05-03 DIAGNOSIS — Z87891 Personal history of nicotine dependence: Secondary | ICD-10-CM

## 2022-05-03 DIAGNOSIS — I480 Paroxysmal atrial fibrillation: Secondary | ICD-10-CM | POA: Diagnosis not present

## 2022-05-03 DIAGNOSIS — H9193 Unspecified hearing loss, bilateral: Secondary | ICD-10-CM | POA: Diagnosis not present

## 2022-05-03 DIAGNOSIS — L89151 Pressure ulcer of sacral region, stage 1: Secondary | ICD-10-CM | POA: Diagnosis not present

## 2022-05-03 DIAGNOSIS — E559 Vitamin D deficiency, unspecified: Secondary | ICD-10-CM

## 2022-05-03 DIAGNOSIS — I4892 Unspecified atrial flutter: Secondary | ICD-10-CM | POA: Diagnosis not present

## 2022-05-03 DIAGNOSIS — D696 Thrombocytopenia, unspecified: Secondary | ICD-10-CM | POA: Diagnosis not present

## 2022-05-03 DIAGNOSIS — I739 Peripheral vascular disease, unspecified: Secondary | ICD-10-CM | POA: Diagnosis not present

## 2022-05-03 NOTE — Telephone Encounter (Addendum)
May try melatonin 5mg  nightly. Update with effect. Also, I placed neurology referral in [place] to be seen within the month - and instead neuropsychology appt was scheduled for 01/2023. I want him to see neurologist in [place]. I have placed new neurology order and will forward this to [place].

## 2022-05-07 DIAGNOSIS — I4892 Unspecified atrial flutter: Secondary | ICD-10-CM | POA: Diagnosis not present

## 2022-05-07 DIAGNOSIS — F32A Depression, unspecified: Secondary | ICD-10-CM | POA: Diagnosis not present

## 2022-05-07 DIAGNOSIS — F0393 Unspecified dementia, unspecified severity, with mood disturbance: Secondary | ICD-10-CM | POA: Diagnosis not present

## 2022-05-07 DIAGNOSIS — I480 Paroxysmal atrial fibrillation: Secondary | ICD-10-CM | POA: Diagnosis not present

## 2022-05-07 DIAGNOSIS — I951 Orthostatic hypotension: Secondary | ICD-10-CM | POA: Diagnosis not present

## 2022-05-08 DIAGNOSIS — F32A Depression, unspecified: Secondary | ICD-10-CM | POA: Diagnosis not present

## 2022-05-08 DIAGNOSIS — I4892 Unspecified atrial flutter: Secondary | ICD-10-CM | POA: Diagnosis not present

## 2022-05-08 DIAGNOSIS — I951 Orthostatic hypotension: Secondary | ICD-10-CM | POA: Diagnosis not present

## 2022-05-08 DIAGNOSIS — I480 Paroxysmal atrial fibrillation: Secondary | ICD-10-CM | POA: Diagnosis not present

## 2022-05-08 DIAGNOSIS — F0393 Unspecified dementia, unspecified severity, with mood disturbance: Secondary | ICD-10-CM | POA: Diagnosis not present

## 2022-05-15 ENCOUNTER — Other Ambulatory Visit: Payer: Medicare Other

## 2022-05-15 DIAGNOSIS — F0393 Unspecified dementia, unspecified severity, with mood disturbance: Secondary | ICD-10-CM | POA: Diagnosis not present

## 2022-05-15 DIAGNOSIS — F32A Depression, unspecified: Secondary | ICD-10-CM | POA: Diagnosis not present

## 2022-05-15 DIAGNOSIS — I480 Paroxysmal atrial fibrillation: Secondary | ICD-10-CM | POA: Diagnosis not present

## 2022-05-15 DIAGNOSIS — I4892 Unspecified atrial flutter: Secondary | ICD-10-CM | POA: Diagnosis not present

## 2022-05-15 DIAGNOSIS — I951 Orthostatic hypotension: Secondary | ICD-10-CM | POA: Diagnosis not present

## 2022-05-15 DIAGNOSIS — Z515 Encounter for palliative care: Secondary | ICD-10-CM

## 2022-05-15 NOTE — Progress Notes (Signed)
PATIENT NAME: Dylan [name] DOB: Dec 14, 1933 MRN: [id number]  PRIMARY CARE PROVIDER: [name], MD  RESPONSIBLE PARTY:  Acct ID - Guarantor Home Phone Work Phone Relationship Acct Type  [id number] [name], [name]  Self P/F     [address] [place], [place] [address]    Follow up call made to wife [name] to schedule a home visit for patient and check on overall status.  Per wife, patient is working with [facility]/OT/Nsg and has returned [facility].  Wife believes patient is getting stronger.  Unable to use a walker but will use a cane when he remembers too.  Po intake remains good.  Wife agreeable to a visit next Friday at 1 pm.    Lorenza Burton, RN

## 2022-05-16 ENCOUNTER — Other Ambulatory Visit: Payer: Self-pay

## 2022-05-16 ENCOUNTER — Telehealth: Payer: Self-pay

## 2022-05-16 ENCOUNTER — Observation Stay
Admission: EM | Admit: 2022-05-16 | Discharge: 2022-05-17 | Disposition: A | Discharge status: Home / Self Care | Payer: Medicare Other | Attending: Internal Medicine | Admitting: Internal Medicine

## 2022-05-16 ENCOUNTER — Emergency Department: Payer: Medicare Other

## 2022-05-16 ENCOUNTER — Encounter: Payer: Self-pay | Admitting: Emergency Medicine

## 2022-05-16 DIAGNOSIS — I959 Hypotension, unspecified: Secondary | ICD-10-CM | POA: Diagnosis present

## 2022-05-16 DIAGNOSIS — I480 Paroxysmal atrial fibrillation: Secondary | ICD-10-CM | POA: Insufficient documentation

## 2022-05-16 DIAGNOSIS — N1831 Chronic kidney disease, stage 3a: Secondary | ICD-10-CM | POA: Insufficient documentation

## 2022-05-16 DIAGNOSIS — I080 Rheumatic disorders of both mitral and aortic valves: Secondary | ICD-10-CM | POA: Insufficient documentation

## 2022-05-16 DIAGNOSIS — Z66 Do not resuscitate: Secondary | ICD-10-CM | POA: Insufficient documentation

## 2022-05-16 DIAGNOSIS — F0393 Unspecified dementia, unspecified severity, with mood disturbance: Secondary | ICD-10-CM | POA: Diagnosis not present

## 2022-05-16 DIAGNOSIS — Z8249 Family history of ischemic heart disease and other diseases of the circulatory system: Secondary | ICD-10-CM | POA: Diagnosis not present

## 2022-05-16 DIAGNOSIS — I951 Orthostatic hypotension: Principal | ICD-10-CM | POA: Insufficient documentation

## 2022-05-16 DIAGNOSIS — R9431 Abnormal electrocardiogram [ECG] [EKG]: Secondary | ICD-10-CM | POA: Diagnosis not present

## 2022-05-16 DIAGNOSIS — Z8619 Personal history of other infectious and parasitic diseases: Secondary | ICD-10-CM | POA: Diagnosis not present

## 2022-05-16 DIAGNOSIS — R079 Chest pain, unspecified: Secondary | ICD-10-CM | POA: Insufficient documentation

## 2022-05-16 DIAGNOSIS — F03C0 Unspecified dementia, severe, without behavioral disturbance, psychotic disturbance, mood disturbance, and anxiety: Secondary | ICD-10-CM | POA: Insufficient documentation

## 2022-05-16 DIAGNOSIS — E872 Acidosis, unspecified: Secondary | ICD-10-CM | POA: Insufficient documentation

## 2022-05-16 DIAGNOSIS — Z79899 Other long term (current) drug therapy: Secondary | ICD-10-CM | POA: Insufficient documentation

## 2022-05-16 DIAGNOSIS — R6889 Other general symptoms and signs: Secondary | ICD-10-CM | POA: Diagnosis not present

## 2022-05-16 DIAGNOSIS — E785 Hyperlipidemia, unspecified: Secondary | ICD-10-CM | POA: Insufficient documentation

## 2022-05-16 DIAGNOSIS — R008 Other abnormalities of heart beat: Secondary | ICD-10-CM | POA: Insufficient documentation

## 2022-05-16 DIAGNOSIS — N400 Enlarged prostate without lower urinary tract symptoms: Secondary | ICD-10-CM | POA: Insufficient documentation

## 2022-05-16 DIAGNOSIS — I495 Sick sinus syndrome: Secondary | ICD-10-CM | POA: Insufficient documentation

## 2022-05-16 DIAGNOSIS — D696 Thrombocytopenia, unspecified: Secondary | ICD-10-CM | POA: Diagnosis not present

## 2022-05-16 DIAGNOSIS — R001 Bradycardia, unspecified: Secondary | ICD-10-CM | POA: Diagnosis present

## 2022-05-16 DIAGNOSIS — I4892 Unspecified atrial flutter: Secondary | ICD-10-CM | POA: Insufficient documentation

## 2022-05-16 DIAGNOSIS — I739 Peripheral vascular disease, unspecified: Secondary | ICD-10-CM | POA: Insufficient documentation

## 2022-05-16 DIAGNOSIS — H9193 Unspecified hearing loss, bilateral: Secondary | ICD-10-CM | POA: Insufficient documentation

## 2022-05-16 DIAGNOSIS — F32A Depression, unspecified: Secondary | ICD-10-CM | POA: Diagnosis not present

## 2022-05-16 DIAGNOSIS — I452 Bifascicular block: Secondary | ICD-10-CM | POA: Insufficient documentation

## 2022-05-16 DIAGNOSIS — Z743 Need for continuous supervision: Secondary | ICD-10-CM | POA: Diagnosis not present

## 2022-05-16 DIAGNOSIS — I4891 Unspecified atrial fibrillation: Secondary | ICD-10-CM | POA: Diagnosis not present

## 2022-05-16 DIAGNOSIS — I499 Cardiac arrhythmia, unspecified: Secondary | ICD-10-CM | POA: Diagnosis not present

## 2022-05-16 LAB — URINALYSIS, ROUTINE W REFLEX MICROSCOPIC
Bilirubin Urine: NEGATIVE
Glucose, UA: NEGATIVE mg/dL
Hgb urine dipstick: NEGATIVE
Ketones, ur: NEGATIVE mg/dL
Leukocytes,Ua: NEGATIVE
Nitrite: NEGATIVE
Protein, ur: NEGATIVE mg/dL
Specific Gravity, Urine: 1.008 (ref 1.005–1.030)
pH: 7 (ref 5.0–8.0)

## 2022-05-16 LAB — COMPREHENSIVE METABOLIC PANEL
ALT: 16 U/L (ref 0–44)
AST: 18 U/L (ref 15–41)
Albumin: 3.5 g/dL (ref 3.5–5.0)
Alkaline Phosphatase: 67 U/L (ref 38–126)
Anion gap: 7 (ref 5–15)
BUN: 21 mg/dL (ref 8–23)
CO2: 25 mmol/L (ref 22–32)
Calcium: 8.8 mg/dL — ABNORMAL LOW (ref 8.9–10.3)
Chloride: 106 mmol/L (ref 98–111)
Creatinine, Ser: 1.32 mg/dL — ABNORMAL HIGH (ref 0.61–1.24)
GFR, Estimated: 53 mL/min — ABNORMAL LOW (ref >60)
Glucose, Bld: 72 mg/dL (ref 70–99)
Potassium: 4.5 mmol/L (ref 3.5–5.1)
Sodium: 138 mmol/L (ref 135–145)
Total Bilirubin: 1 mg/dL (ref 0.3–1.2)
Total Protein: 6.3 g/dL — ABNORMAL LOW (ref 6.5–8.1)

## 2022-05-16 LAB — LACTIC ACID, PLASMA
Lactic Acid, Venous: 2 mmol/L (ref 0.5–1.9)
Lactic Acid, Venous: 2.8 mmol/L (ref 0.5–1.9)
Lactic Acid, Venous: 1.1 mmol/L (ref 0.5–1.9)

## 2022-05-16 LAB — CBC WITH DIFFERENTIAL/PLATELET
Abs Immature Granulocytes: 0.02 10*3/uL (ref 0.00–0.07)
Basophils Absolute: 0 10*3/uL (ref 0.0–0.1)
Basophils Relative: 1 %
Eosinophils Absolute: 0.1 10*3/uL (ref 0.0–0.5)
Eosinophils Relative: 1 %
HCT: 42.6 % (ref 39.0–52.0)
Hemoglobin: 14.5 g/dL (ref 13.0–17.0)
Immature Granulocytes: 0 %
Lymphocytes Relative: 24 %
Lymphs Abs: 1.5 10*3/uL (ref 0.7–4.0)
MCH: 31.8 pg (ref 26.0–34.0)
MCHC: 34 g/dL (ref 30.0–36.0)
MCV: 93.4 fL (ref 80.0–100.0)
Monocytes Absolute: 0.7 10*3/uL (ref 0.1–1.0)
Monocytes Relative: 12 %
Neutro Abs: 4 10*3/uL (ref 1.7–7.7)
Neutrophils Relative %: 62 %
Platelets: 105 10*3/uL — ABNORMAL LOW (ref 150–400)
RBC: 4.56 MIL/uL (ref 4.22–5.81)
RDW: 13.8 % (ref 11.5–15.5)
WBC: 6.4 10*3/uL (ref 4.0–10.5)
nRBC: 0 % (ref 0.0–0.2)

## 2022-05-16 LAB — TROPONIN I (HIGH SENSITIVITY)
Troponin I (High Sensitivity): 8 ng/L (ref <18)
Troponin I (High Sensitivity): 8 ng/L (ref <18)

## 2022-05-16 LAB — PROCALCITONIN: Procalcitonin: <0.1 ng/mL

## 2022-05-16 MED ORDER — BOOST / RESOURCE BREEZE PO LIQD CUSTOM: 1.0000 | Freq: Every day | ORAL | Status: DC | PRN

## 2022-05-16 MED ORDER — SODIUM CHLORIDE 0.9 % IV SOLN: INTRAVENOUS | Status: AC

## 2022-05-16 MED ORDER — ACETAMINOPHEN 325 MG PO TABS: 650.0000 mg | ORAL_TABLET | Freq: Four times a day (QID) | ORAL | Status: DC | PRN

## 2022-05-16 MED ORDER — RISAQUAD PO CAPS
1.0000 | ORAL_CAPSULE | Freq: Every day | ORAL | Status: DC
  Administered 2022-05-17: 1 via ORAL
  Filled 2022-05-16: qty 1

## 2022-05-16 MED ORDER — ACETAMINOPHEN 650 MG RE SUPP: 650.0000 mg | Freq: Four times a day (QID) | RECTAL | Status: DC | PRN

## 2022-05-16 MED ORDER — SENNA 8.6 MG PO TABS
1.0000 | ORAL_TABLET | Freq: Every day | ORAL | Status: DC
  Administered 2022-05-16: 8.6 mg via ORAL
  Filled 2022-05-16: qty 1

## 2022-05-16 MED ORDER — SODIUM CHLORIDE 0.9 % IV SOLN
2.0000 g | Freq: Once | INTRAVENOUS | Status: AC
  Administered 2022-05-16: 2 g via INTRAVENOUS
  Filled 2022-05-16: qty 12.5

## 2022-05-16 MED ORDER — MIDODRINE HCL 5 MG PO TABS
10.0000 mg | ORAL_TABLET | Freq: Three times a day (TID) | ORAL | Status: DC
  Administered 2022-05-16 – 2022-05-17 (×4): 10 mg via ORAL
  Filled 2022-05-16 (×4): qty 2

## 2022-05-16 MED ORDER — MIDODRINE HCL 5 MG PO TABS: 10.0000 mg | ORAL_TABLET | Freq: Three times a day (TID) | ORAL | Status: DC

## 2022-05-16 MED ORDER — ATROPINE SULFATE 1 MG/10ML IJ SOSY: 0.5000 mg | PREFILLED_SYRINGE | Freq: Once | INTRAMUSCULAR | Status: DC

## 2022-05-16 MED ORDER — FLUOXETINE HCL 20 MG PO CAPS
20.0000 mg | ORAL_CAPSULE | Freq: Every day | ORAL | Status: DC
  Administered 2022-05-17: 20 mg via ORAL
  Filled 2022-05-16: qty 1

## 2022-05-16 MED ORDER — POLYETHYLENE GLYCOL 3350 17 GM/SCOOP PO POWD
17.0000 g | Freq: Every day | ORAL | Status: DC
  Filled 2022-05-16: qty 255

## 2022-05-16 MED ORDER — ALBUTEROL SULFATE (2.5 MG/3ML) 0.083% IN NEBU: 2.5000 mg | INHALATION_SOLUTION | RESPIRATORY_TRACT | Status: DC | PRN

## 2022-05-16 MED ORDER — HEPARIN SODIUM (PORCINE) 5000 UNIT/ML IJ SOLN
5000.0000 [IU] | Freq: Three times a day (TID) | INTRAMUSCULAR | Status: DC
Start: 2022-05-16 — End: 2022-05-18
  Administered 2022-05-16 – 2022-05-17 (×3): 5000 [IU] via SUBCUTANEOUS
  Filled 2022-05-16 (×3): qty 1

## 2022-05-16 MED ORDER — SODIUM CHLORIDE 0.9 % IV BOLUS
1000.0000 mL | Freq: Once | INTRAVENOUS | Status: AC
  Administered 2022-05-16: 1000 mL via INTRAVENOUS

## 2022-05-16 MED ORDER — VANCOMYCIN HCL IN DEXTROSE 1-5 GM/200ML-% IV SOLN
1000.0000 mg | Freq: Once | INTRAVENOUS | Status: AC
  Administered 2022-05-16: 1000 mg via INTRAVENOUS
  Filled 2022-05-16: qty 200

## 2022-05-16 MED ORDER — VITAMIN D 25 MCG (1000 UNIT) PO TABS
1000.0000 [IU] | ORAL_TABLET | Freq: Every day | ORAL | Status: DC
  Administered 2022-05-17: 1000 [IU] via ORAL
  Filled 2022-05-16: qty 1

## 2022-05-16 MED ORDER — FLUDROCORTISONE ACETATE 0.1 MG PO TABS
0.1000 mg | ORAL_TABLET | Freq: Every day | ORAL | Status: DC
  Administered 2022-05-16 – 2022-05-17 (×2): 0.1 mg via ORAL
  Filled 2022-05-16 (×2): qty 1

## 2022-05-16 MED ORDER — POLYETHYLENE GLYCOL 3350 17 G PO PACK
17.0000 g | PACK | Freq: Every day | ORAL | Status: DC
  Administered 2022-05-17: 17 g via ORAL
  Filled 2022-05-16: qty 1

## 2022-05-16 NOTE — ED Provider Notes (Signed)
[facility] Provider Note    Event Date/Time   First MD Initiated Contact with Patient 05/16/22 1254     (approximate)   History   Bradycardia and Hypotension   HPI  Dylan [name] is a 88 y.o. male who comes from home with bradycardia and hypotension.  On further review he is actually in bigeminy with the 2 complexes close together looks like his rate is only half of what the actual rate is.  Patient's blood pressure is low it was 409 systolic initially.  Patient takes midodrine at home.  He was given some this morning but did not really do anything.  Patient's wife reports he has no code no CPR but if we can find something that we can fix then we can go ahead and do it.      Physical Exam   Triage Vital Signs: ED Triage Vitals  Enc Vitals Group     BP 05/16/22 1256 (!) 100/54     Pulse Rate 05/16/22 1253 (!) 35     Resp 05/16/22 1256 18     Temp 05/16/22 1256 98.4 F (36.9 C)     Temp Source 05/16/22 1256 Oral     SpO2 05/16/22 1248 97 %     Weight --      Height --      Head Circumference --      Peak Flow --      Pain Score --      Pain Loc --      Pain Edu? --      Excl. in [place]? --     Most recent vital signs: Vitals:   05/16/22 1448 05/16/22 1455  BP: 115/70 (!) 91/46  Pulse: 60   Resp: 16   Temp: 98.2 F (36.8 C)   SpO2: 93%      General: Awake, no distress.  CV:  Good peripheral perfusion.  Heart regular rate and rhythm no audible murmurs Resp:  Normal effort.  Lungs are clear Abd:  No distention.  Soft and nontender No edema patient moving all extremities equally and well   ED Results / Procedures / Treatments   Labs (all labs ordered are listed, but only abnormal results are displayed) Labs Reviewed  COMPREHENSIVE METABOLIC PANEL - Abnormal; Notable for the following components:      Result Value   Creatinine, Ser 1.32 (*)    Calcium 8.8 (*)    Total Protein 6.3 (*)    GFR, Estimated 53 (*)    All other  components within normal limits  LACTIC ACID, PLASMA - Abnormal; Notable for the following components:   Lactic Acid, Venous 2.0 (*)    All other components within normal limits  CBC WITH DIFFERENTIAL/PLATELET - Abnormal; Notable for the following components:   Platelets 105 (*)    All other components within normal limits  LACTIC ACID, PLASMA  TROPONIN I (HIGH SENSITIVITY)  TROPONIN I (HIGH SENSITIVITY)     EKG  EKG read and interpreted by me shows bigeminy at 90 left axis no acute ST-T changes   RADIOLOGY Chest x-ray read by radiology reviewed by me and interpreted by me does not show any acute disease that I can see.  Radiology agrees   PROCEDURES:  Critical Care performed:   Procedures   MEDICATIONS ORDERED IN ED: Medications  atropine 1 MG/10ML injection 0.5 mg (0.5 mg Intravenous Not Given 05/16/22 1309)  midodrine (PROAMATINE) tablet 10 mg (10 mg Oral Given  05/16/22 1449)  fludrocortisone (FLORINEF) tablet 0.1 mg (has no administration in time range)  sodium chloride 0.9 % bolus 1,000 mL (0 mLs Intravenous Stopped 05/16/22 1448)  sodium chloride 0.9 % bolus 1,000 mL (1,000 mLs Intravenous New Bag/Given 05/16/22 1532)     IMPRESSION / MDM / ASSESSMENT AND PLAN / ED COURSE  I reviewed the triage vital signs and the nursing notes. ----------------------------------------- 2:03 PM on 05/16/2022 ----------------------------------------- Patient's blood pressure is improving with fluids.  Patient's wife reports is hard to tell how much she is drinking with his dementia.  He frequently does not eat or drink much.  I have ordered some more fluids.  I have also ordered his midodrine that he usually takes at 8 AM and 2 PM Differential diagnosis includes, but is not limited to,   Patient's presentation is most consistent with acute complicated illness / injury requiring diagnostic workup.   The patient is on the cardiac monitor to evaluate for evidence of arrhythmia and/or  significant heart rate changes.  Patient had bigeminy and trigeminy ----------------------------------------- 3:45 PM on 05/16/2022 ----------------------------------------- Discussed in detail with Dr. Rockey Situ cardiology.  He does not think that the bigeminy has anything to do with the patient's blood pressure.  He feels it is likely due to the patient's Parkinson's and dementia and inanition.  He advises we could add 0.1 mg of Florinef a day in the morning.  This will help him retain fluid and salt.  Patient is currently finishing 1 L which will make him have gotten 1.25 L of fluid.  I put in a second liter and added the Florinef for now.  Plan is if his blood pressure still not high enough to go home without the risk of syncope I would say above 132 systolic after the fluids and Florinef probably we would get him in the hospital.  I am signing this plan out.     FINAL CLINICAL IMPRESSION(S) / ED DIAGNOSES   Final diagnoses:  Hypotension, unspecified hypotension type     Rx / DC Orders   ED Discharge Orders     None        Note:  This document was prepared using Dragon voice recognition software and may include unintentional dictation errors.   Nena Polio, MD 05/16/22 [id number]

## 2022-05-16 NOTE — ED Notes (Signed)
Ed md aware of lactate 2.0

## 2022-05-16 NOTE — H&P (Signed)
History and Physical    Dylan [name] DJM:[id number] DOB: 10-Jul-1934 DOA: 05/16/2022  PCP: [name], MD  Patient coming from:   I have personally briefly reviewed patient's old medical records in [place]  Chief Complaint:  refractory hypotension  HPI: Dylan [name] is a 88 y.o. male with medical history significant of  dementia, hyperlipidemia, atrial flutter off Eliquis, CKD stage IIIa, BPH, orthostatic hypotension, thrombocytopenia, PAD, history of C. difficile, hard of hearing who presents to ED bib EMS due to hypotension refractory to midodrine. Of note patient had similar admission 6/6-6/13 and was seen by cardiology who thought syncope was multifactorial and no inpatient cardiac testing was completed.At that time Eliquis was d/ced due to risk of falls. Patient dose of midodrine at that time was increased to '10mg'$  tid. Today wife states, patient blood pressure was low with systolic in 22'W with associated intermittent hr in the 30's during this time as well as elevated heart rate despite midodrine dose. She notes no n/v/f/c/ or complaints from patient. Of note ros unable to obtain due to patient dementia.   ED Course:  Afeb Hr 35, sat 100%  bP 100/54-91/46, hr 18 Lactic 2,2.8 Wbc: 6.4, hg 14.5, plt 105,  NA 138, K4.5, cr 1.32 Gfr 53 CE8 LN:[id number] Tx atropine 0.'5mg'$  x 1, midodrine, florinef, cefepime/vanc Cxr:NAD NS 1L  EKG shows bifascicular block,  ventricular bigeminy, no significant change from prior   Patient bp improved in ed  to 100/60 with hr to 60 s/p treatment There was concern re possible infection due to elevated lactic however it was thought this was due to poor perfusion. Patient however was empirically treated with antibiotics as well as florinef for hx of orthostatic hypotension as recommended by on call cardiology .  Patient admitted for further evaluation and monitoring.  Review of Systems: As per HPI otherwise 10 point review of systems negative.    Past Medical History:  Diagnosis Date   Basal cell carcinoma 08/31/2020   Mid forehead - ED&C    Basal cell carcinoma of right nasal sidewall 12/15/2014   Bilateral hearing loss 08/03/2018   S/p audiology evaluation, has new hearing aides.   Bradycardia    a. 08/2018 ETT: Poor ex tolerance (2:10); b. 09/2018 s/p MDT Linq.   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   HYDROCELE, LEFT 02/03/2007   Hyperlipidemia 12/1999   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/2014   Mohs defect of eyelid 12/14/2014   Nuclear sclerosis, left 04/01/2017   Obstruction of right lacrimal duct 10/16/2015   PAD (peripheral artery disease) ([place]) 03/21/2020   Abnormal ABI, abnormal arterial duplex 03/2020:  R - moderate RLE disease, abnormal TBI L - WNL, abnormal TBI Referred for vascular consult - rec medical management   Pruritus 02/19/2020   Recurrent syncope 06/22/2018   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 09/2018  s/p MDT Linq; b. 10/2021 Echo: EF 55-60%, no rwma, nl RV fxn, mild MR, triv AI.   Thrombocytopenia ([place]) 02/21/2011   periph smear stable 02/2020   Vitamin B12 deficiency 08/03/2018   Completed b12 shots 09/2018, maintaining levels on oral replacement (dissolvable tablets) 01/2019   Vitamin D deficiency 05/19/2018    Past Surgical History:  Procedure Laterality Date   BASAL CELL CARCINOMA EXCISION  Right 01/2015   with reconstructive surgery around eye   CATARACT EXTRACTION Left 03/2017   ETT  09/2018   negative for ischemia, extremely poor exercise tolerance ([place])   FOOT SURGERY Left 1998   metatarsal amputation after injury   KNEE ARTHROSCOPY  1993   Right   KNEE ARTHROSCOPY  01/2001   Left   LOOP RECORDER INSERTION N/A 09/30/2018   Procedure: LOOP RECORDER INSERTION;  Surgeon: Constance Haw, MD;  Location: [facility];  Service: Cardiovascular;  Laterality: N/A;     reports that he quit smoking about 61 years ago. His smoking use included cigarettes. He has never used smokeless tobacco. He reports that he does not drink alcohol and does not use drugs.  Allergies  Allergen Reactions   Doxepin Other (See Comments)    Lethargy, overly sedating at low dose   Lincomycin Hcl Rash    Family History  Problem Relation Age of Onset   Congestive Heart Failure Mother    Stroke Father 48   Congestive Heart Failure Father    Cataracts Sister    Leukemia Brother     Prior to Admission medications   Medication Sig Start Date End Date Taking? Authorizing Provider  Cholecalciferol (VITAMIN D3) 1000 units CAPS Take 1 capsule (1,000 Units total) by mouth daily. 08/03/18   [name], MD  feeding supplement (BOOST HIGH PROTEIN) LIQD Take 237 mLs by mouth daily as needed (about 3 times a week). 01/11/22   [name], MD  fexofenadine [facility] ALLERGY) 180 MG tablet Take 1 tablet (180 mg total) by mouth daily. 02/19/22   [name], MD  finasteride (PROSCAR) 5 MG tablet Take 1 tablet (5 mg total) by mouth daily. 08/27/21   [name], MD  FLUoxetine (PROZAC) 20 MG capsule Take 1 capsule (20 mg total) by mouth daily. 02/19/22   [name], MD  guaiFENesin-dextromethorphan [facility] DM) 100-10 MG/5ML syrup Take 10 mLs by mouth every 4 (four) hours as needed for cough. 03/18/22   Fritzi Mandes, MD  midodrine (PROAMATINE) 10 MG tablet Take 1 tablet  (10 mg total) by mouth 3 (three) times daily before meals. 04/23/22   Shawna Clamp, MD  Polyethylene Glycol 3350 (MIRALAX PO) Take by mouth daily.    [provider]  Probiotic Product (ALIGN PO) Take by mouth daily.    [provider]  Sennosides (SENOKOT PO) Take 1 tablet by mouth at bedtime.    [provider]  vitamin B-12 (V-R VITAMIN B-12) 500 MCG tablet Take 1 tablet (500 mcg total) by mouth daily. Patient taking differently: Take 500 mcg by mouth at bedtime. 08/11/19   [name], MD    Physical Exam: Vitals:   05/16/22 1400 05/16/22 1448 05/16/22 1455 05/16/22 1635  BP: 111/60 115/70 (!) 91/46 100/60  Pulse: (!) 56 60  60  Resp: (!) '22 16  18  '$ Temp:  98.2 F (36.8 C)  98.5 F (36.9 C)  TempSrc:  Oral  Oral  SpO2: 95% 93%  98%     Vitals:   05/16/22 1400 05/16/22  1448 05/16/22 1455 05/16/22 1635  BP: 111/60 115/70 (!) 91/46 100/60  Pulse: (!) 56 60  60  Resp: (!) '22 16  18  '$ Temp:  98.2 F (36.8 C)  98.5 F (36.9 C)  TempSrc:  Oral  Oral  SpO2: 95% 93%  98%  Constitutional: NAD, calm, comfortable Eyes: PERRL, lids and conjunctivae normal ENMT: Mucous membranes are moist. Posterior pharynx clear of any exudate or lesions.Normal dentition.  Neck: normal, supple, no masses, no thyromegaly Respiratory: clear to auscultation bilaterally, no wheezing, no crackles. Normal respiratory effort. No accessory muscle use.  Cardiovascular: irRegular rhythm,bradycardic, no murmurs / rubs / gallops. No extremity edema. 2+ pedal pulses.   Abdomen: no tenderness, no masses palpated. No hepatosplenomegaly. Bowel sounds positive.  Musculoskeletal: no clubbing / cyanosis. No joint deformity upper and lower extremities. Good ROM, no contractures. Normal muscle tone.  Skin: no rashes, lesions, ulcers. No induration Neurologic: CN 2-12 grossly intact. Sensation intact,  Strength 5/5 in all 4.  Psychiatric: unable to assess, Labs on Admission: I have  personally reviewed following labs and imaging studies  CBC: Recent Labs  Lab 05/16/22 1258  WBC 6.4  NEUTROABS 4.0  HGB 14.5  HCT 42.6  MCV 93.4  PLT 376*   Basic Metabolic Panel: Recent Labs  Lab 05/16/22 1258  NA 138  K 4.5  CL 106  CO2 25  GLUCOSE 72  BUN 21  CREATININE 1.32*  CALCIUM 8.8*   GFR: CrCl cannot be calculated (Unknown ideal weight.). Liver Function Tests: Recent Labs  Lab 05/16/22 1258  AST 18  ALT 16  ALKPHOS 67  BILITOT 1.0  PROT 6.3*  ALBUMIN 3.5   No results for input(s): "LIPASE", "AMYLASE" in the last 168 hours. No results for input(s): "AMMONIA" in the last 168 hours. Coagulation Profile: No results for input(s): "INR", "PROTIME" in the last 168 hours. Cardiac Enzymes: No results for input(s): "CKTOTAL", "CKMB", "CKMBINDEX", "TROPONINI" in the last 168 hours. BNP (last 3 results) No results for input(s): "PROBNP" in the last 8760 hours. HbA1C: No results for input(s): "HGBA1C" in the last 72 hours. CBG: No results for input(s): "GLUCAP" in the last 168 hours. Lipid Profile: No results for input(s): "CHOL", "HDL", "LDLCALC", "TRIG", "CHOLHDL", "LDLDIRECT" in the last 72 hours. Thyroid Function Tests: No results for input(s): "TSH", "T4TOTAL", "FREET4", "T3FREE", "THYROIDAB" in the last 72 hours. Anemia Panel: No results for input(s): "VITAMINB12", "FOLATE", "FERRITIN", "TIBC", "IRON", "RETICCTPCT" in the last 72 hours. Urine analysis:    Component Value Date/Time   COLORURINE STRAW (A) 05/16/2022 1636   APPEARANCEUR CLEAR (A) 05/16/2022 1636   LABSPEC 1.008 05/16/2022 1636   PHURINE 7.0 05/16/2022 1636   GLUCOSEU NEGATIVE 05/16/2022 1636   HGBUR NEGATIVE 05/16/2022 1636   BILIRUBINUR NEGATIVE 05/16/2022 1636   BILIRUBINUR 1+ 06/05/2021 1635   KETONESUR NEGATIVE 05/16/2022 1636   PROTEINUR NEGATIVE 05/16/2022 1636   UROBILINOGEN 0.2 06/05/2021 1635   NITRITE NEGATIVE 05/16/2022 1636   LEUKOCYTESUR NEGATIVE 05/16/2022 1636     Radiological Exams on Admission: DG Chest Portable 1 View  Result Date: 05/16/2022 CLINICAL DATA:  Bradycardia and hypotension. EXAM: PORTABLE CHEST 1 VIEW COMPARISON:  Chest x-ray dated Mar 17, 2022. FINDINGS: Unchanged loop recorder. The heart size and mediastinal contours are within normal limits. Normal pulmonary vascularity. No focal consolidation, pleural effusion, or pneumothorax. No acute osseous abnormality. IMPRESSION: 1. No active disease. Electronically Signed   By: Titus Dubin M.D.   On: 05/16/2022 13:28    EKG: Independently reviewed.  See above  Assessment/Plan  Refractory Orthostatic hypotension with associated bradycardia  -despite midodrine  -improved s/p ivfs/ atropine in ED -thought on previous admission to be multifactorial  -continue midodrine  -continue florinef, check cortisol on admit labs  - continue supportive ivfs  - admit to progressive care  -cardiology to leave final recs regarding any further evaluation / treatment   Lactic Acidosis -thought to be due to poor perfusion  -cxr/ ua negative -s/p abx in ED - patient is afebrile no increase wbc, no source of infection  - monitor lactic on ivfs , with increase bp  - hold abx for now  -can resume if clinical picture suggest progressive infection   Dementia -no new active issues    Hyperlipidemia -continue statin    Atrial flutter  -off Eliquis,BB -currently bradycardic , (tachy /brady syndrome)  -await cardiology recs    CKD stage IIIa -stable    BPH -hold medications currently due to lower bp  -monitor I/o closely    DVT prophylaxis: heparin Code Status: DNR Family Communication: wife at beside Disposition Plan: patient  expected to be admitted greater than 2 midnights  Consults called: cardiology DR Rockey Situ Admission status: progressive care   Clance Boll MD [facility]   If 7PM-7AM, please contact night-coverage [email or web address] Password [facility]  05/16/2022, 5:38  PM

## 2022-05-16 NOTE — Telephone Encounter (Signed)
Per chart review tab pt is still at [facility] ED waiting to see if will need admission. Sending note to Dr Darnell Level and Lattie Haw CMA.

## 2022-05-16 NOTE — Telephone Encounter (Signed)
I spoke with pts wife (DPR signed) and pt is already at [facility] ED. [name] said they had a hard time getting IV started but they do have that started now. Vitals were cked after the midodrine dose this morning when P was 33.[name] said that the vitals have not gone up since he has been at ED and IV was started. Pt has had blood testing and xrays but [name] is not sure what testing was done and have not gotten results back yet. I asked [name] if she felt like taking pt to ED was what she wanted and she said yes; she was afraid for pt to stand due to low heart rate and being dehydrated. [name] will call tomorrow with update on how pt is doing. Sending to Dr Darnell Level and Lattie Haw CMA.

## 2022-05-16 NOTE — Telephone Encounter (Signed)
April from [facility] called in stating they took patient's HR, and it was 33. Did have patient and nurse speak with Access nurse.

## 2022-05-16 NOTE — Telephone Encounter (Signed)
I spoke with April with [facility] and she has not heard back from access nurse yet. Pts BP 100/60 P 33 pts urine is dark and pt not drinking a lot and pts wife is afraid for pt to be up because she thinks pt is dehydrated. April is not aware of dry mouth,abd pain or dizziness. April agrees pt needs to go to ED with low vitals and possible dehydration. April said pt will probably go by EMS toED. Sending note to Dr Darnell Level who is out of office, Dr Damita Dunnings who is in office and Lattie Haw CMA.

## 2022-05-16 NOTE — Telephone Encounter (Signed)
Would want to ensure vitals were checked after midodrine dosing, not before.  Before ER evaluation would try to encourage fluid intake, and if he's overall feeling well, and wife comfortable monitoring pulse, would have them recheck pulse in 30 min to 1 hr to see if this rises with midodrine.  Wife is reliable - does she feel he needs to go to ER? If so, ok to go.

## 2022-05-16 NOTE — Telephone Encounter (Signed)
Noted. Thanks.

## 2022-05-16 NOTE — Telephone Encounter (Signed)
Dylan [name] - Client TELEPHONE ADVICE RECORD Access[facility]urse Patient Name: Dylan [name] ER Gender: Male DOB: [birth year] Age: 88 Y 3 M 3 D Return Phone Number: [phone] (Primary), [phone] (Secondary) Address: [name] [address] Client [name] - Client Client Site [place] Provider [name] - MD Contact Type Call Who Is Calling Patient / Member / Family / Caregiver Call Type Triage / Clinical Caller Name April Relationship To Patient Care Giver Return Phone Number [phone] (Primary) Chief Complaint Heart palpitations or irregular heartbeat Reason for Call Symptomatic / Request for [place] states that she is with [facility] home health with a patient that has a heart rate of 33. No symptoms at this time. He is on medication for his heart rate and blood pressure. Additional Comment Caller was transferred from the office as they want the patient triaged before scheduling any appts. Translation No Nurse Assessment Nurse: Ysidro Evert, RN, Levada Dy Date/Time (Eastern Time): 05/16/2022 11:54:40 AM Confirm and document reason for call. If symptomatic, describe symptoms. ---Caller states her patient's heart rate is 33. No other symptoms Does the patient have any new or worsening symptoms? ---Yes Will a triage be completed? ---Yes Related visit to physician within the last 2 weeks? ---No Does the PT have any chronic conditions? (i.e. diabetes, asthma, this includes High risk factors for pregnancy, etc.) ---Yes List chronic conditions. ---diabetes Is this a behavioral health or substance abuse call? ---No Guidelines Guideline Title Affirmed Question Affirmed Notes Nurse Date/Time (Eastern Time) Heart Rate and Heartbeat Questions Sounds like a life-threatening emergency to the triager Ysidro Evert, RN, Levada Dy 05/16/2022 11:55:42 AM PLEASE  NOTE: All timestamps contained within this report are represented as [place] Standard Time. CONFIDENTIALTY NOTICE: This fax transmission is intended only for the addressee. It contains information that is legally privileged, confidential or otherwise protected from use or disclosure. If you are not the intended recipient, you are strictly prohibited from reviewing, disclosing, copying using or disseminating any of this information or taking any action in reliance on or regarding this information. If you have received this fax in error, please notify us immediately by telephone so that we can arrange for its return to [place]. Phone: [phone], Toll-Free: [phone], Fax: [phone] Page: 2 of 2 Call Id: [id number] [place]. Time Eilene Ghazi Time) Disposition Final User 05/16/2022 11:57:15 AM Call EMS 911 Now Yes Ysidro Evert, RN, Levada Dy 05/16/2022 11:58:00 AM 911 Outcome Documentation Ysidro Evert, RN, Levada Dy Reason: Home Health Nurse April states she will call the ambulance now Final Disposition 05/16/2022 11:57:15 AM Call EMS 911 Now Yes Ysidro Evert, RN, [place] Shutter Disagree/Comply Comply Caller Understands Yes PreDisposition Did not know what to do Care Advice Given Per Guideline CALL EMS 911 NOW: * Immediate medical attention is needed. You need to hang up and call 911 (or an ambulance). CARE ADVICE given per Heart Rate and Heartbeat Questions (Adult) guideline

## 2022-05-16 NOTE — ED Notes (Addendum)
Sent message notifying Dr. Marcello Moores that patient's heart rate has dropped into the 30's intermittently on several occasions, but comes back up into the 50's and 60's after a few seconds. Advised to continue to monitor BP, and patient symptoms and reach out to Cardiology if needed. Will continue to monitor.

## 2022-05-16 NOTE — ED Triage Notes (Signed)
Pt to ED via [facility] from home for Bradycardia and hypotension. Per EMS pts family reports that pts HR has been in the 30s all morning. Pt initial BP 64/42 with EMS. Pt has severe dementia. Pt currently alert, Pt placed on zoll pads upon arrival.

## 2022-05-17 ENCOUNTER — Inpatient Hospital Stay (HOSPITAL_BASED_OUTPATIENT_CLINIC_OR_DEPARTMENT_OTHER)
Admit: 2022-05-17 | Discharge: 2022-05-17 | Disposition: A | Discharge status: Home / Self Care | Payer: Medicare Other | Attending: Internal Medicine | Admitting: Internal Medicine

## 2022-05-17 DIAGNOSIS — R001 Bradycardia, unspecified: Secondary | ICD-10-CM

## 2022-05-17 DIAGNOSIS — I441 Atrioventricular block, second degree: Secondary | ICD-10-CM | POA: Diagnosis not present

## 2022-05-17 DIAGNOSIS — I951 Orthostatic hypotension: Secondary | ICD-10-CM

## 2022-05-17 DIAGNOSIS — R079 Chest pain, unspecified: Secondary | ICD-10-CM | POA: Diagnosis not present

## 2022-05-17 DIAGNOSIS — I959 Hypotension, unspecified: Secondary | ICD-10-CM | POA: Diagnosis not present

## 2022-05-17 LAB — ECHOCARDIOGRAM COMPLETE
AR max vel: 1.7 cm2
AV Area VTI: 1.73 cm2
AV Area mean vel: 1.84 cm2
AV Mean grad: 3 mmHg
AV Peak grad: 5.6 mmHg
Ao pk vel: 1.18 m/s
Area-P 1/2: 2.91 cm2
Height: 73 in
P 1/2 time: 1099 msec
S' Lateral: 3.21 cm
Weight: 2885.38 oz

## 2022-05-17 LAB — CBC
HCT: 40.8 % (ref 39.0–52.0)
Hemoglobin: 13.9 g/dL (ref 13.0–17.0)
MCH: 31.3 pg (ref 26.0–34.0)
MCHC: 34.1 g/dL (ref 30.0–36.0)
MCV: 91.9 fL (ref 80.0–100.0)
Platelets: 80 10*3/uL — ABNORMAL LOW (ref 150–400)
RBC: 4.44 MIL/uL (ref 4.22–5.81)
RDW: 13.5 % (ref 11.5–15.5)
WBC: 5.7 10*3/uL (ref 4.0–10.5)
nRBC: 0 % (ref 0.0–0.2)

## 2022-05-17 LAB — COMPREHENSIVE METABOLIC PANEL
ALT: 15 U/L (ref 0–44)
AST: 19 U/L (ref 15–41)
Albumin: 3.6 g/dL (ref 3.5–5.0)
Alkaline Phosphatase: 65 U/L (ref 38–126)
Anion gap: 6 (ref 5–15)
BUN: 17 mg/dL (ref 8–23)
CO2: 27 mmol/L (ref 22–32)
Calcium: 8.7 mg/dL — ABNORMAL LOW (ref 8.9–10.3)
Chloride: 108 mmol/L (ref 98–111)
Creatinine, Ser: 1.27 mg/dL — ABNORMAL HIGH (ref 0.61–1.24)
GFR, Estimated: 55 mL/min — ABNORMAL LOW (ref >60)
Glucose, Bld: 86 mg/dL (ref 70–99)
Potassium: 4.1 mmol/L (ref 3.5–5.1)
Sodium: 141 mmol/L (ref 135–145)
Total Bilirubin: 1.1 mg/dL (ref 0.3–1.2)
Total Protein: 6.1 g/dL — ABNORMAL LOW (ref 6.5–8.1)

## 2022-05-17 LAB — LACTIC ACID, PLASMA: Lactic Acid, Venous: 0.9 mmol/L (ref 0.5–1.9)

## 2022-05-17 LAB — GLUCOSE, CAPILLARY
Glucose-Capillary: 89 mg/dL (ref 70–99)
Glucose-Capillary: 79 mg/dL (ref 70–99)

## 2022-05-17 LAB — C-REACTIVE PROTEIN: CRP: 0.5 mg/dL (ref <1.0)

## 2022-05-17 MED ORDER — CHLORHEXIDINE GLUCONATE CLOTH 2 % EX PADS
6.0000 | MEDICATED_PAD | Freq: Every day | CUTANEOUS | Status: DC
  Administered 2022-05-17: 6 via TOPICAL

## 2022-05-17 MED ORDER — FLUDROCORTISONE ACETATE 0.1 MG PO TABS: 0.1000 mg | ORAL_TABLET | Freq: Every day | ORAL | 0 refills | Status: DC

## 2022-05-17 NOTE — Telephone Encounter (Addendum)
Upon review of this referral the referral was sent to [facility] in June and the referral coordinator received the referral and sent it to Dr Melvyn Novas (NeuroPsych with [facility]) for him to review. He agreed to see the patient. Their office labeled the referral 'NeuroPsych', it was sent over as a Neurology referral.   See 04/26/2022 Referral Notes      Referral Notes Type Date User Summary Attachment  General 04/26/2022  3:19 PM Hazle Coca, PhD Auto: Referral message -  Note:   ----- Message ----- From: Hazle Coca, PhD Sent: 04/26/2022   3:19 PM EDT To: [facility]   We can schedule him.    ----- Message ----- From: [facility] Sent: 04/26/2022   3:12 PM EDT To: Hazle Coca, PhD   Good Afternoon,   We received a new referral for neuropsych eval but this is what I first saw. I was not sure about this.   Dementia with behavioral disturbance                                     BvFTD, r/o Alzheimer's disease.  Wife is primary caregiver. Intolerance to aricept. Stop Namenda XR.  Will also refer to neuro for evaluation given ongoing/worsening sexual disinhibitions.    It looks like per Epic the new referral was sent to [facility] (DR Rexene Alberts) for them to review and [facility] is requesting an update on the previous referral to [facility] - they do not want to schedule the patient until its noted 100% that you are not wanting the patient to see [facility] for NeuroPsych Eval.   How do you want to proceed? Do you want to keep the patient with [facility] as NeuroPsych Eval or are you wanting to cancel that referral and have [facility] schedule the patient?

## 2022-05-17 NOTE — Progress Notes (Signed)
RNCM spoke with patient's wife [name] regarding Code 30 who verbalized understanding. RNCM will mail Medicare Outpatient Observation Notice to patient at home address.   No additional TOC needs at this time.

## 2022-05-17 NOTE — Progress Notes (Signed)
PROGRESS NOTE    Dylan [name]  WUJ:[id number] DOB: [birth year] DOA: 05/16/2022 PCP: [name], MD    Brief Narrative:  88 year old gentleman with history of dementia, hyperlipidemia, a flutter currently without Eliquis, CKD stage IIIa, orthostatic hypotension on midodrine, very hard of hearing, recent episodes of bradycardia and hypotension brought to the emergency room with systolic blood pressure 60, heart rate reading of 30.  Patient has advanced dementia and unable to provide any symptoms.  He was noted sometimes to be acting differently.  In the emergency room hemodynamically stable.  Heart rate was 35, he was given 1 dose of atropine, midodrine Florinef with improvement of heart rate.  Blood pressure improved.  Admitted due to symptomatic bradycardia for further evaluation.  Followed by cardiology.   Assessment & Plan:   Refractory orthostatic hypotension with associated bradycardia: Orthostatic hypotension is probably multifactorial.  Autonomic dysfunction in a patient with advanced dementia. Currently on Florinef 0.1 mg daily, midodrine 10 mg 3 times a day.  Blood pressures are adequate today.  Check orthostatic blood pressures, if still low, will increase dose of midodrine. A flutter with bradycardia is probably due to sick sinus syndrome, not on any rate control medications.  Cardiology to evaluate.  Unsure whether he is a candidate for pacemaker.  Lactic acidosis: No evidence of infection.  Discontinue all antibiotics.  Dementia: Lives at home with wife.  Behavior is well controlled.  CKD stage IIIa: Renal functions fairly stable.  Right-sided chest pain: Probably musculoskeletal pain.   DVT prophylaxis: heparin injection 5,000 Units Start: 05/16/22 2200   Code Status: DNR Family Communication: Wife at the bedside Disposition Plan: Status is: Inpatient Remains inpatient appropriate because: Significant bradycardia     Consultants:  Cardiology  Procedures:   None  Antimicrobials:  None   Subjective: Patient seen and examined.  I was called by nursing staff that patient complained of spasm of the right upper chest wall that was transient and improved by the time I arrived.  Patient is pleasant, tries to be interactive.  Denied any complaints.  Looks comfortable.  Wife at the bedside. Telemetry monitor with heart rate as low as 31.  Orthostatics pending.  Objective: Vitals:   05/17/22 0700 05/17/22 0800 05/17/22 0900 05/17/22 1000  BP:    (!) 119/45  Pulse: (!) 53 (!) 52  (!) 31  Resp:   18   Temp:      TempSrc:      SpO2: 100% 95%  100%  Weight:      Height:        Intake/Output Summary (Last 24 hours) at 05/17/2022 1237 Last data filed at 05/17/2022 0750 Gross per 24 hour  Intake 2697.55 ml  Output 100 ml  Net 2597.55 ml   Filed Weights   05/17/22 0226  Weight: 81.8 kg    Examination:  General exam: Appears calm and comfortable  Pleasant and confused. Respiratory system: No added sound.  No palpable tenderness. Cardiovascular system: S1 & S2 heard, RRR.  Bradycardic.  No pedal edema. Gastrointestinal system: Abdomen is nondistended, soft and nontender. No organomegaly or masses felt. Normal bowel sounds heard. Central nervous system: Alert and awake.  No focal deficit.  Pleasant and confused.  Oriented x0.    Data Reviewed: I have personally reviewed following labs and imaging studies  CBC: Recent Labs  Lab 05/16/22 1258 05/17/22 0649  WBC 6.4 5.7  NEUTROABS 4.0  --   HGB 14.5 13.9  HCT 42.6 40.8  MCV 93.4  91.9  PLT 105* 80*   Basic Metabolic Panel: Recent Labs  Lab 05/16/22 1258 05/17/22 0649  NA 138 141  K 4.5 4.1  CL 106 108  CO2 25 27  GLUCOSE 72 86  BUN 21 17  CREATININE 1.32* 1.27*  CALCIUM 8.8* 8.7*   GFR: Estimated Creatinine Clearance: 47.2 mL/min (A) (by C-G formula based on SCr of 1.27 mg/dL (H)). Liver Function Tests: Recent Labs  Lab 05/16/22 1258 05/17/22 0649  AST 18 19  ALT  16 15  ALKPHOS 67 65  BILITOT 1.0 1.1  PROT 6.3* 6.1*  ALBUMIN 3.5 3.6   No results for input(s): "LIPASE", "AMYLASE" in the last 168 hours. No results for input(s): "AMMONIA" in the last 168 hours. Coagulation Profile: No results for input(s): "INR", "PROTIME" in the last 168 hours. Cardiac Enzymes: No results for input(s): "CKTOTAL", "CKMB", "CKMBINDEX", "TROPONINI" in the last 168 hours. BNP (last 3 results) No results for input(s): "PROBNP" in the last 8760 hours. HbA1C: No results for input(s): "HGBA1C" in the last 72 hours. CBG: Recent Labs  Lab 05/17/22 0225 05/17/22 0728  GLUCAP 89 79   Lipid Profile: No results for input(s): "CHOL", "HDL", "LDLCALC", "TRIG", "CHOLHDL", "LDLDIRECT" in the last 72 hours. Thyroid Function Tests: No results for input(s): "TSH", "T4TOTAL", "FREET4", "T3FREE", "THYROIDAB" in the last 72 hours. Anemia Panel: No results for input(s): "VITAMINB12", "FOLATE", "FERRITIN", "TIBC", "IRON", "RETICCTPCT" in the last 72 hours. Sepsis Labs: Recent Labs  Lab 05/16/22 1257 05/16/22 1455 05/16/22 2105 05/16/22 2330  PROCALCITON  --   --  <0.10  --   LATICACIDVEN 2.0* 2.8* 1.1 0.9    Recent Results (from the past 240 hour(s))  Blood culture (routine x 2)     Status: None (Preliminary result)   Collection Time: 05/16/22  5:35 PM   Specimen: BLOOD  Result Value Ref Range Status   Specimen Description BLOOD BLRA  Final   Special Requests BOTTLES DRAWN AEROBIC AND ANAEROBIC [place]  Final   Culture   Final    NO GROWTH < 12 HOURS Performed at [facility], [address] [place], [place] [address]    Report Status PENDING  Incomplete  Blood culture (routine x 2)     Status: None (Preliminary result)   Collection Time: 05/16/22  5:40 PM   Specimen: BLOOD  Result Value Ref Range Status   Specimen Description BLOOD RFOA  Final   Special Requests BOTTLES DRAWN AEROBIC AND ANAEROBIC BCAV  Final   Culture   Final    NO GROWTH < 12  HOURS Performed at [facility], [address] [place], [place] [address]    Report Status PENDING  Incomplete         Radiology Studies: DG Chest Portable 1 View  Result Date: 05/16/2022 CLINICAL DATA:  Bradycardia and hypotension. EXAM: PORTABLE CHEST 1 VIEW COMPARISON:  Chest x-ray dated Mar 17, 2022. FINDINGS: Unchanged loop recorder. The heart size and mediastinal contours are within normal limits. Normal pulmonary vascularity. No focal consolidation, pleural effusion, or pneumothorax. No acute osseous abnormality. IMPRESSION: 1. No active disease. Electronically Signed   By: Titus Dubin M.D.   On: 05/16/2022 13:28        Scheduled Meds:  acidophilus  1 capsule Oral Daily   atropine  0.5 mg Intravenous Once   Chlorhexidine Gluconate Cloth  6 each Topical Daily   cholecalciferol  1,000 Units Oral Daily   fludrocortisone  0.1 mg Oral Daily   FLUoxetine  20 mg Oral Daily   heparin  5,000 Units Subcutaneous Q8H   midodrine  10 mg Oral TID WC   polyethylene glycol  17 g Oral Daily   senna  1 tablet Oral QHS   Continuous Infusions:   LOS: 1 day    Time spent: 35 minutes    Barb Merino, MD [facility] Pager [phone]

## 2022-05-17 NOTE — Progress Notes (Signed)
Pt. Discharged home with his wife with stable V/S and all questions answered. The decision was made by wife and treatment team to D/C pt home D/T worsening dementia symptoms and agitation. The pt's wife states he does better in his home environment.

## 2022-05-17 NOTE — Discharge Summary (Signed)
Physician Discharge Summary  Dylan [name] ZOX:[id number] DOB: 1934-06-13 DOA: 05/16/2022  PCP: Dylan Bush, MD  Admit date: 05/16/2022 Discharge date: 05/17/2022  Admitted From: Home Disposition: Home  Recommendations for Outpatient Follow-up:  Cardiology will schedule follow-up  Home Health: N/A Equipment/Devices: N/A  Discharge Condition: Stable CODE STATUS: DNR Diet recommendation: Regular diet  Discharge summary:  88 year old gentleman with history of dementia, hyperlipidemia, a flutter currently without anticoagulation, CKD stage IIIa, orthostatic hypotension on midodrine and recent episode of bradycardia and hypotension brought to emergency room with systolic blood pressures of 60s and heart rate of 30 in the home monitor, however patient without any obvious symptoms.  Admitted for monitoring and cardiology evaluation.  As per cardiology evaluation, patient is having frequent PVCs and ventricular bigeminy that is likely because of perceived bradycardia in the monitor showing low heart rate.  24 hours in the telemetry with reasonable heart rate.  Patient has severe orthostatic hypotension and currently on midodrine 10 mg 3 times a day.  He was also started on Florinef 0.1 mg daily that improved his orthostatic and currently asymptomatic.  As per cardiology recommendations, patient is not a candidate for pacemaker or not required any pacemaker at this time.  Added Florinef to the regimen for orthostatic hypotension and he was discharged home. He is not on anticoagulation, side effects outweigh benefits.  Discharge Diagnoses:  Principal Problem:   Hypotension Active Problems:   Symptomatic bradycardia    Discharge Instructions  Discharge Instructions     Diet general   Complete by: As directed    Discharge instructions   Complete by: As directed    Continue using abdominal binder and compression stockings   Increase activity slowly   Complete by: As directed        Allergies as of 05/17/2022       Reactions   Doxepin Other (See Comments)   Lethargy, overly sedating at low dose   Lincomycin Hcl Rash        Medication List     TAKE these medications    ALIGN PO Take by mouth daily.   feeding supplement Liqd Take 237 mLs by mouth daily as needed (about 3 times a week).   fexofenadine 180 MG tablet Commonly known as: Allegra Allergy Take 1 tablet (180 mg total) by mouth daily.   finasteride 5 MG tablet Commonly known as: PROSCAR Take 1 tablet (5 mg total) by mouth daily.   fludrocortisone 0.1 MG tablet Commonly known as: FLORINEF Take 1 tablet (0.1 mg total) by mouth daily. Start taking on: May 18, 2022   FLUoxetine 20 MG capsule Commonly known as: PROZAC Take 1 capsule (20 mg total) by mouth daily.   guaiFENesin-dextromethorphan 100-10 MG/5ML syrup Commonly known as: ROBITUSSIN DM Take 10 mLs by mouth every 4 (four) hours as needed for cough.   midodrine 10 MG tablet Commonly known as: PROAMATINE Take 1 tablet (10 mg total) by mouth 3 (three) times daily before meals.   MIRALAX PO Take 17 g by mouth daily.   SENOKOT PO Take 1 tablet by mouth at bedtime.   vitamin B-12 500 MCG tablet Commonly known as: V-R VITAMIN B-12 Take 1 tablet (500 mcg total) by mouth daily. What changed: when to take this   Vitamin D3 25 MCG (1000 UT) Caps Take 1 capsule (1,000 Units total) by mouth daily.        Allergies  Allergen Reactions   Doxepin Other (See Comments)    Lethargy, overly sedating  at low dose   Lincomycin Hcl Rash    Consultations: Cardiology   Procedures/Studies: ECHOCARDIOGRAM COMPLETE  Result Date: 05/17/2022    ECHOCARDIOGRAM REPORT   Patient Name:   Dylan [name] Date of Exam: 05/17/2022 Medical Rec #:  [id number]      Height:       73.0 in Accession #:    [id number]     Weight:       180.3 lb Date of Birth:  [birth year]       BSA:          2.059 m Patient Age:    89 years       BP:           113/65 mmHg  Patient Gender: M              HR:           54 bpm. Exam Location:  [facility] Procedure: 2D Echo, Color Doppler and Cardiac Doppler Indications:     I44.1 Heart block, 2nd degree  History:         Patient has prior history of Echocardiogram examinations, most                  recent 11/01/2021. CKD, stage 3 and PAD; Risk                  Factors:Dyslipidemia.  Sonographer:     Charmayne Sheer Referring Phys:  [id number] Dylan [name] Diagnosing Phys: Dylan Rogue MD  Sonographer Comments: No subcostal window. IMPRESSIONS  1. Left ventricular ejection fraction, by estimation, is 55 to 60%. The left ventricle has normal function. The left ventricle has no regional wall motion abnormalities. Left ventricular diastolic parameters are consistent with Grade II diastolic dysfunction (pseudonormalization).  2. Right ventricular systolic function is normal. The right ventricular size is normal.  3. Left atrial size was mildly dilated.  4. The mitral valve is normal in structure. Mild mitral valve regurgitation. No evidence of mitral stenosis.  5. The aortic valve was not well visualized. Aortic valve regurgitation is mild. No aortic stenosis is present.  6. The inferior vena cava is normal in size with greater than 50% respiratory variability, suggesting right atrial pressure of 3 mmHg. FINDINGS  Left Ventricle: Left ventricular ejection fraction, by estimation, is 55 to 60%. The left ventricle has normal function. The left ventricle has no regional wall motion abnormalities. The left ventricular internal cavity size was normal in size. There is  no left ventricular hypertrophy. Left ventricular diastolic parameters are consistent with Grade II diastolic dysfunction (pseudonormalization). Right Ventricle: The right ventricular size is normal. No increase in right ventricular wall thickness. Right ventricular systolic function is normal. Left Atrium: Left atrial size was mildly dilated. Right Atrium: Right atrial size was normal  in size. Pericardium: There is no evidence of pericardial effusion. Mitral Valve: The mitral valve is normal in structure. Mild mitral valve regurgitation. No evidence of mitral valve stenosis. Tricuspid Valve: The tricuspid valve is normal in structure. Tricuspid valve regurgitation is not demonstrated. No evidence of tricuspid stenosis. Aortic Valve: The aortic valve was not well visualized. Aortic valve regurgitation is mild. Aortic regurgitation PHT measures 1099 msec. No aortic stenosis is present. Aortic valve mean gradient measures 3.0 mmHg. Aortic valve peak gradient measures 5.6 mmHg. Aortic valve area, by VTI measures 1.73 cm. Pulmonic Valve: The pulmonic valve was normal in structure. Pulmonic valve regurgitation is not visualized. No evidence of pulmonic stenosis. Aorta:  The aortic root is normal in size and structure. Venous: The inferior vena cava is normal in size with greater than 50% respiratory variability, suggesting right atrial pressure of 3 mmHg. IAS/Shunts: No atrial level shunt detected by color flow Doppler.  LEFT VENTRICLE PLAX 2D LVIDd:         3.77 cm   Diastology LVIDs:         3.21 cm   LV e' medial:    6.64 cm/s LV PW:         1.03 cm   LV E/e' medial:  11.6 LV IVS:        1.05 cm   LV e' lateral:   6.64 cm/s LVOT diam:     1.80 cm   LV E/e' lateral: 11.6 LV SV:         49 LV SV Index:   24 LVOT Area:     2.54 cm  RIGHT VENTRICLE RV Basal diam:  4.36 cm RV S prime:     12.50 cm/s TAPSE (M-mode): 2.5 cm LEFT ATRIUM             Index        RIGHT ATRIUM           Index LA diam:        4.10 cm 1.99 cm/m   RA Area:     15.90 cm LA Vol (A2C):   38.5 ml 18.70 ml/m  RA Volume:   42.00 ml  20.40 ml/m LA Vol (A4C):   32.6 ml 15.83 ml/m LA Biplane Vol: 37.7 ml 18.31 ml/m  AORTIC VALVE                    PULMONIC VALVE AV Area (Vmax):    1.70 cm     PV Vmax:       0.82 m/s AV Area (Vmean):   1.84 cm     PV Peak grad:  2.7 mmHg AV Area (VTI):     1.73 cm AV Vmax:           118.00 cm/s  AV Vmean:          82.000 cm/s AV VTI:            0.282 m AV Peak Grad:      5.6 mmHg AV Mean Grad:      3.0 mmHg LVOT Vmax:         79.00 cm/s LVOT Vmean:        59.400 cm/s LVOT VTI:          0.192 m LVOT/AV VTI ratio: 0.68 AI PHT:            1099 msec  AORTA Ao Root diam: 3.30 cm MITRAL VALVE MV Area (PHT): 2.91 cm    SHUNTS MV Decel Time: 261 msec    Systemic VTI:  0.19 m MV E velocity: 77.10 cm/s  Systemic Diam: 1.80 cm MV A velocity: 67.30 cm/s MV E/A ratio:  1.15 Dylan Rogue MD Electronically signed by Dylan Rogue MD Signature Date/Time: 05/17/2022/1:11:09 PM    Final    DG Chest Portable 1 View  Result Date: 05/16/2022 CLINICAL DATA:  Bradycardia and hypotension. EXAM: PORTABLE CHEST 1 VIEW COMPARISON:  Chest x-ray dated Mar 17, 2022. FINDINGS: Unchanged loop recorder. The heart size and mediastinal contours are within normal limits. Normal pulmonary vascularity. No focal consolidation, pleural effusion, or pneumothorax. No acute osseous abnormality. IMPRESSION: 1. No active disease. Electronically Signed  By: Titus Dubin M.D.   On: 05/16/2022 13:28   CUP PACEART REMOTE DEVICE CHECK  Result Date: 04/22/2022 ILR summary report received. Battery status OK. Normal device function. No new symptom,  or pause episodes. No new AF episodes. Monthly summary reports and ROV/PRN 3 nocturnal brady events, SB with bigeminal PVC's, 8-37sec in duration.   AF burden 0.5%, no EGM's LA  (Echo, Carotid, EGD, Colonoscopy, ERCP)    Subjective: I examined patient with wife at the bedside.  Patient is pleasantly confused.  By later in the evening as there were no interventions planned, family wished him to go home to avoid sundowning and nighttime restlessness.  With his stability, he was discharged.   Discharge Exam: Vitals:   05/17/22 1500 05/17/22 1600  BP: 109/70 125/66  Pulse: (!) 53 (!) 54  Resp: 15 19  Temp:    SpO2: 99% 100%   Vitals:   05/17/22 1300 05/17/22 1400 05/17/22 1500 05/17/22  1600  BP: (!) 125/58 125/64 109/70 125/66  Pulse: (!) 34 (!) 48 (!) 53 (!) 54  Resp: 16 (!) '21 15 19  '$ Temp:      TempSrc:      SpO2: 99% 100% 99% 100%  Weight:      Height:        General: Pt is alert, awake, not in acute distress Alert and awake but not oriented.  Very pleasant and confused. Cardiovascular: RRR, S1/S2 +, no rubs, no gallops Respiratory: CTA bilaterally, no wheezing, no rhonchi Abdominal: Soft, NT, ND, bowel sounds + Extremities: no edema, no cyanosis    The results of significant diagnostics from this hospitalization (including imaging, microbiology, ancillary and laboratory) are listed below for reference.     Microbiology: Recent Results (from the past 240 hour(s))  Blood culture (routine x 2)     Status: None (Preliminary result)   Collection Time: 05/16/22  5:35 PM   Specimen: BLOOD  Result Value Ref Range Status   Specimen Description BLOOD BLRA  Final   Special Requests BOTTLES DRAWN AEROBIC AND ANAEROBIC [facility]  Final   Culture   Final    NO GROWTH < 12 HOURS Performed at [facility], [address] [place], [place] [address]    Report Status PENDING  Incomplete  Blood culture (routine x 2)     Status: None (Preliminary result)   Collection Time: 05/16/22  5:40 PM   Specimen: BLOOD  Result Value Ref Range Status   Specimen Description BLOOD RFOA  Final   Special Requests BOTTLES DRAWN AEROBIC AND ANAEROBIC BCAV  Final   Culture   Final    NO GROWTH < 12 HOURS Performed at [facility], [address] [place], [place] [address]    Report Status PENDING  Incomplete     Labs: BNP (last 3 results) Recent Labs    03/17/22 0506  BNP 209.4*   Basic Metabolic Panel: Recent Labs  Lab 05/16/22 1258 05/17/22 0649  NA 138 141  K 4.5 4.1  CL 106 108  CO2 25 27  GLUCOSE 72 86  BUN 21 17  CREATININE 1.32* 1.27*  CALCIUM 8.8* 8.7*   Liver Function Tests: Recent Labs  Lab 05/16/22 1258 05/17/22 0649  AST 18 19   ALT 16 15  ALKPHOS 67 65  BILITOT 1.0 1.1  PROT 6.3* 6.1*  ALBUMIN 3.5 3.6   No results for input(s): "LIPASE", "AMYLASE" in the last 168 hours. No results for input(s): "AMMONIA" in the last  168 hours. CBC: Recent Labs  Lab 05/16/22 1258 05/17/22 0649  WBC 6.4 5.7  NEUTROABS 4.0  --   HGB 14.5 13.9  HCT 42.6 40.8  MCV 93.4 91.9  PLT 105* 80*   Cardiac Enzymes: No results for input(s): "CKTOTAL", "CKMB", "CKMBINDEX", "TROPONINI" in the last 168 hours. BNP: Invalid input(s): "POCBNP" CBG: Recent Labs  Lab 05/17/22 0225 05/17/22 0728  GLUCAP 89 79   D-Dimer No results for input(s): "DDIMER" in the last 72 hours. Hgb A1c No results for input(s): "HGBA1C" in the last 72 hours. Lipid Profile No results for input(s): "CHOL", "HDL", "LDLCALC", "TRIG", "CHOLHDL", "LDLDIRECT" in the last 72 hours. Thyroid function studies No results for input(s): "TSH", "T4TOTAL", "T3FREE", "THYROIDAB" in the last 72 hours.  Invalid input(s): "FREET3" Anemia work up No results for input(s): "VITAMINB12", "FOLATE", "FERRITIN", "TIBC", "IRON", "RETICCTPCT" in the last 72 hours. Urinalysis    Component Value Date/Time   COLORURINE STRAW (A) 05/16/2022 1636   APPEARANCEUR CLEAR (A) 05/16/2022 1636   LABSPEC 1.008 05/16/2022 1636   PHURINE 7.0 05/16/2022 1636   GLUCOSEU NEGATIVE 05/16/2022 1636   HGBUR NEGATIVE 05/16/2022 1636   BILIRUBINUR NEGATIVE 05/16/2022 1636   BILIRUBINUR 1+ 06/05/2021 1635   KETONESUR NEGATIVE 05/16/2022 1636   PROTEINUR NEGATIVE 05/16/2022 1636   UROBILINOGEN 0.2 06/05/2021 1635   NITRITE NEGATIVE 05/16/2022 1636   LEUKOCYTESUR NEGATIVE 05/16/2022 1636   Sepsis Labs Recent Labs  Lab 05/16/22 1258 05/17/22 0649  WBC 6.4 5.7   Microbiology Recent Results (from the past 240 hour(s))  Blood culture (routine x 2)     Status: None (Preliminary result)   Collection Time: 05/16/22  5:35 PM   Specimen: BLOOD  Result Value Ref Range Status   Specimen  Description BLOOD BLRA  Final   Special Requests BOTTLES DRAWN AEROBIC AND ANAEROBIC [place]  Final   Culture   Final    NO GROWTH < 12 HOURS Performed at [facility], [address] [place], [place] [address]    Report Status PENDING  Incomplete  Blood culture (routine x 2)     Status: None (Preliminary result)   Collection Time: 05/16/22  5:40 PM   Specimen: BLOOD  Result Value Ref Range Status   Specimen Description BLOOD RFOA  Final   Special Requests BOTTLES DRAWN AEROBIC AND ANAEROBIC BCAV  Final   Culture   Final    NO GROWTH < 12 HOURS Performed at [facility], [address], [place], [place] [address]    Report Status PENDING  Incomplete     Time coordinating discharge: 35 minutes  SIGNED:   Barb Merino, MD  [facility] 05/17/2022, 5:49 PM

## 2022-05-17 NOTE — Progress Notes (Signed)
Cardiology Consult    Patient ID: ISHMEL ACEVEDO MRN: [id number], DOB/AGE: [birth year]   Admit date: 05/16/2022 Date of Consult: 05/17/2022  Primary Physician: [name], MD Primary Cardiologist: Quay Burow, MD Requesting Provider: Dr. Sloan Leiter  Patient Profile    Dylan [name] is a 88 y.o. male with a history of recurrent syncope, orthostatic hypotension, PAD, HLD, dementia, CKD III, and RBBB,  who is being seen today for the evaluation of bradycardia/frequent PVCs at the request of Dr. Sloan Leiter.  Past Medical History   Past Medical History:  Diagnosis Date   Basal cell carcinoma 08/31/2020   Mid forehead - ED&C    Basal cell carcinoma of right nasal sidewall 12/15/2014   Bilateral hearing loss 08/03/2018   S/p audiology evaluation, has new hearing aides.   Bradycardia    a. 08/2018 ETT: Poor ex tolerance (2:10); b. 09/2018 s/p MDT Linq.   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 09/2018 s/p MDT Linq; b. 10/2021 Echo: EF 55-60%, no rwma, nl RV fxn, mild MR, triv AI.   Thrombocytopenia ([place]) 02/21/2011   periph smear stable 02/2020   Vitamin B12 deficiency 08/03/2018   Completed b12 shots 09/2018, maintaining levels on oral replacement (dissolvable tablets) 01/2019   Vitamin D deficiency 05/19/2018    Past Surgical History:  Procedure Laterality Date   BASAL CELL CARCINOMA EXCISION Right 01/2015   with reconstructive surgery around eye   CATARACT EXTRACTION Left 03/2017   ETT  09/2018   negative for ischemia, extremely poor exercise tolerance ([place])   FOOT SURGERY Left 1998   metatarsal amputation after injury   KNEE ARTHROSCOPY  1993   Right   KNEE ARTHROSCOPY  01/2001   Left   LOOP RECORDER INSERTION N/A 09/30/2018   Procedure: LOOP RECORDER INSERTION;  Surgeon: Constance Haw, MD;  Location: [name];  Service: Cardiovascular;  Laterality: N/A;     Allergies  Allergies  Allergen Reactions   Doxepin Other (See Comments)    Lethargy, overly sedating at low dose   Lincomycin Hcl Rash    History of Present Illness    Mr. Dylan [name] is a 88 year old male with a past medical history of recurrent syncope, orthostatic hypotension req midodrine, PAD, HLD, dementia, CKD III, and RBBB.  A MDT Linq was placed in 2019 when his  recurrent syncope first started.  He is followed by Dr. Curt Bears and though he had never had symptomatic brady noted on LINQ, he was found to have rate-controlled paroxysmal atrial flutter in 09/2021, and he was placed on eliquis at that time.  In 04/2022, he was hosp @ [facility] following a syncopal spell - his wife found him on the bathroom floor when she awoke that morning.  Not clear if he had had syncope before she awoke, but he was described as being pale and diaphoretic.  In the ED, his ECG showed sinus  rhythm @ 67 w/ ventricular bigeminy, LAD, and RBBB (old).   Linq interrogation w/ PAF (12.5% burden), periodic bradycardia - generally during hours of sleep and assoc w/ bigeminy.  In the setting of recurrent syncope and high risk for falls, eliquis was d/c'd.  He was discharged home with home health.  Since then, he has seen by both Cardiology and EP offices.  Both recommended to continue midodrine TID, oral hydration, and the use of an abdominal binder to assist with hypotension.  He has remained off of eliquis.  He was in his USOH on 7/6, when his wife checked his VS prior to giving him midodrine.  She noted on her BP cuff, that his HR was in the 30's w/ a BP of 177 systolic.  She repeated his BP several times and HR remained low.  She gave him his midodrine and when his HHRN arrived, BP was repeated and HR was again recorded in the 30's.  Per wife, pt was completely asymptomatic.  EMS was called after a discussion w/ his PCP and EMS ECG showed sinus rhythm @ 66 w/ ventricular bigeminy and RBBB.  Notes indicate that upon EMS arrival, BP was in the 60's and thus he was taken to the [facility] ED.  Here, initial BP was 100/54, and he remained in sinus w/ ventricular bigeminy.  With the exception of a lactate of 2.0  2.8, labs were otw unremarkable.  He was empirically treated w/ abx and placed on florinef and IVF.  Overnight, pt has remained in sinus rhythm w/ PACs and frequent PVCs/bigeminy.  He is sitting up at the bedside this AM w/ wife @ bedside.  Wife notes that on two occasions this AM, that he winced and said his chest hurt, though both episodes were brief.  Pt has no complaints.  Inpatient Medications     acidophilus  1 capsule Oral Daily   atropine  0.5 mg Intravenous Once   Chlorhexidine Gluconate Cloth  6 each Topical Daily   cholecalciferol  1,000 Units Oral Daily   fludrocortisone  0.1 mg Oral Daily   FLUoxetine  20 mg Oral Daily   heparin  5,000 Units Subcutaneous Q8H   midodrine  10 mg Oral  TID WC   polyethylene glycol  17 g Oral Daily   senna  1 tablet Oral QHS    Family History    Family History  Problem Relation Age of Onset   Congestive Heart Failure Mother    Stroke Father 69   Congestive Heart Failure Father    Cataracts Sister    Leukemia Brother    He indicated that his mother is deceased. He indicated that his father is deceased. He indicated that his sister is alive. He indicated that two of his three brothers are alive. He indicated that his maternal grandmother is deceased. He indicated that his maternal grandfather is deceased. He indicated that his paternal grandmother is deceased.  He indicated that his paternal grandfather is deceased.   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, minerals, Glass blower/designer  Tobacco Use   Smoking status: Former    Types: Cigarettes    Quit date: 11/11/1960    Years since quitting: 61.5   Smokeless tobacco: Never  Vaping Use   Vaping Use: Never used  Substance and Sexual Activity   Alcohol use: No   Drug use: No   Sexual activity: Not on file  Other Topics Concern   Not on file  Social History Narrative   Lives with wife [place])   One stepson   Occ: retired, Furniture conservator/restorer, 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  Cardiovascular:  +++ orthostatic hypotension. No chest pain, dyspnea on exertion, edema, orthopnea, palpitations, paroxysmal nocturnal dyspnea. Dermatological: No rash, lesions/masses Respiratory: No cough, dyspnea Urologic: No hematuria, dysuria Abdominal:   No nausea, vomiting, diarrhea, bright red blood per rectum, melena, or hematemesis Neurologic:  +++ dementia. No visual changes, wkns, changes in mental status. All other systems reviewed and are otherwise negative except as noted above.  Physical Exam    Blood pressure (!) 105/54, pulse (!) 25, temperature 98.6 F (37 C), temperature source Oral, resp. rate (!) 21, height '6\' 1"'$  (1.854 m), weight 81.8 kg, SpO2 95 %.  General: Pleasant, NAD Psych: Normal affect. Neuro: Disorientation to time/place. Moves all extremities spontaneously. HEENT: Normal  Neck: Supple without bruits or JVD. Lungs:  Resp regular and unlabored, CTA. Heart: RRR, freq ectopy, no s3, s4, or murmurs. Abdomen: Soft, non-tender, non-distended, BS + x 4.  Extremities: No clubbing, cyanosis or edema. DP/PT2+, Radials 2+ and equal bilaterally.  Labs    Cardiac Enzymes Recent Labs  Lab 05/16/22 1258 05/16/22 1455  TROPONINIHS 8 8     BNP    Component Value Date/Time   BNP 296.8 (H) 03/17/2022 0506    ProBNP No results found for: "PROBNP"  Lab Results  Component Value Date   WBC 5.7 05/17/2022   HGB 13.9 05/17/2022   HCT 40.8 05/17/2022   MCV 91.9 05/17/2022   PLT 80 (L) 05/17/2022    Recent Labs  Lab 05/16/22 1258  NA 138  K 4.5  CL 106  CO2 25  BUN 21  CREATININE 1.32*  CALCIUM 8.8*  PROT 6.3*  BILITOT 1.0  ALKPHOS 67  ALT 16  AST 18  GLUCOSE 72   Lab Results  Component Value Date   CHOL 168 04/22/2022   HDL 30 (L) 04/22/2022   LDLCALC 109 (H) 04/22/2022   TRIG 146 04/22/2022   Lab Results  Component Value Date  DDIMER <0.27 03/17/2022      Radiology Studies    DG Chest Portable 1  View  Result Date: 05/16/2022 CLINICAL DATA:  Bradycardia and hypotension. EXAM: PORTABLE CHEST 1 VIEW COMPARISON:  Chest x-ray dated Mar 17, 2022. FINDINGS: Unchanged loop recorder. The heart size and mediastinal contours are within normal limits. Normal pulmonary vascularity. No focal consolidation, pleural effusion, or pneumothorax. No acute osseous abnormality. IMPRESSION: 1. No active disease. Electronically Signed   By: Titus Dubin M.D.   On: 05/16/2022 13:28   CUP PACEART REMOTE DEVICE CHECK  Result Date: 04/22/2022 ILR summary report received. Battery status OK. Normal device function. No new symptom,  or pause episodes. No new AF episodes. Monthly summary reports and ROV/PRN 3 nocturnal brady events, SB with bigeminal PVC's, 8-37sec in duration.   AF burden 0.5%, no EGM's LA   ECG & Cardiac Imaging    EMS 12 lead from 7/6: Sinus rhythm, 66, ventricular bigeminy, RBBB - personally reviewed.  Tele sinus w/ frequent PVCs/bigeminy, and PACs - personally reviewed.  Assessment & Plan    1.  Ventricular Bigeminy:  Pt w/ a h/o PVCs and ventricular bigeminy, as well as paroxysmal atrial flutter.  Nl EF by echo in 10/2021.  He is not on AVN blocking agents due to a h/o orthostatic hypoentsion and syncope, for which he is on midodrine.  On 7/6, his wife checked his BP and his HR registered in the 30's on multiple repeat checks.  Pt was asymptomatic.  EMS was called and ECG showed sinus w/ ventricular bigeminy @ 66 bpm w/ RBBB (old).  BP initially in the 60's thus prompting ED eval.  Since admission, BPs have been stable in the 90's to low 100's.  HR's also stable in the 50's to 70's with sinus, PACs, and freq PVCs/bigeminy. Pt remains asymptomatic.  ILR has been interrogated and does not show any new significant events since his last interrogation in June.  Bradycardia is periodically reported, though no significant bradycardia yesterday.  As prev noted, most bradycardic episodes are associated w/  bigeminy and occur in the overnight/early morning hours.  Discussed lack of pulsatile perfusion assoc w/ PVCs with wife, resulting in falsely low HR recordings on BP cuff.  No additional intervention for reported bradycardia/PVCs required at this time.  2.  Orthostatic Hypotension/recurrent syncope:  In setting of above, BP initially reported to be in the 60's.  He has since been stable in the 90's to low 100's.  Per wife, he was asymptomatic throughout the day yesterday and has no complaints today.  Cont midodrine and florinef, the latter of which was added yesterday.  Prev advised on use of abd binder/compression hose @ home.  3.  CKD III: creat 1.27 this am.   4.  PAF: Last episode of Afib on 6/26 for 16 mins.  No known symptoms.  No longer on eliquis due to falls/syncope risk.  No AVN blocking agents due to #2.  Risk Assessment/Risk Scores:      CHA2DS2-VASc Score = 3   This indicates a 3.2% annual risk of stroke. The patient's score is based upon: CHF History: 0 HTN History: 0 Diabetes History: 0 Stroke History: 0 Vascular Disease History: 1 Age Score: 2 Gender Score: 0     Signed, Murray Hodgkins, NP 05/17/2022, 7:26 AM  For questions or updates, please contact   Please consult [email or web address] for contact info under Cardiology/STEMI.

## 2022-05-17 NOTE — Care Management CC44 (Deleted)
Condition Code 44 Documentation Completed  Patient Details  Name: Dylan [name] MRN: [id number] Date of Birth: [birth year]   Condition Code 44 given:  Yes  Patient signature on Condition Code 44 notice:  Yes  Documentation of 2 MD's agreement:   Yes Code 44 added to claim:   Yes    Roseanne Kaufman, RN 05/17/2022, 6:41 PM

## 2022-05-17 NOTE — Progress Notes (Signed)
*  PRELIMINARY RESULTS* Echocardiogram 2D Echocardiogram has been performed.  Dylan [name] 05/17/2022, 8:51 AM

## 2022-05-17 NOTE — Care Management CC44 (Signed)
Condition Code 44 Documentation Completed  Patient Details  Name: Dylan [name] MRN: [id number] Date of Birth: Sep 18, 1934   Condition Code 44 given:   Yes Patient signature on Condition Code 44 notice:  Yes  Documentation of 2 MD's agreement:   Yes Code 44 added to claim:  Yes     Roseanne Kaufman, RN 05/17/2022, 6:40 PM

## 2022-05-17 NOTE — Care Management Obs Status (Signed)
[facility] NOTIFICATION   Patient Details  Name: ATHEN RIEL MRN: [id number] Date of Birth: [birth year]   Medicare Observation Status Notification Given:   Yes    Roseanne Kaufman, RN 05/17/2022, 6:40 PM

## 2022-05-17 NOTE — Telephone Encounter (Signed)
Referral to be processed by [facility]  Message sent back to [facility] to cancel appt for Neuropsych Eval

## 2022-05-17 NOTE — Telephone Encounter (Addendum)
Do not want neuropsych eval but rather neurology evaluation which is what I initially ordered. Would prefer he go to [facility] for this. Please cancel appt with Dr Melvyn Novas.

## 2022-05-18 ENCOUNTER — Encounter: Payer: Self-pay | Admitting: Family Medicine

## 2022-05-20 ENCOUNTER — Ambulatory Visit (INDEPENDENT_AMBULATORY_CARE_PROVIDER_SITE_OTHER): Payer: Medicare Other

## 2022-05-20 DIAGNOSIS — R55 Syncope and collapse: Secondary | ICD-10-CM | POA: Diagnosis not present

## 2022-05-21 ENCOUNTER — Telehealth: Payer: Self-pay

## 2022-05-21 DIAGNOSIS — I480 Paroxysmal atrial fibrillation: Secondary | ICD-10-CM | POA: Diagnosis not present

## 2022-05-21 DIAGNOSIS — F32A Depression, unspecified: Secondary | ICD-10-CM | POA: Diagnosis not present

## 2022-05-21 DIAGNOSIS — F0393 Unspecified dementia, unspecified severity, with mood disturbance: Secondary | ICD-10-CM | POA: Diagnosis not present

## 2022-05-21 DIAGNOSIS — I4892 Unspecified atrial flutter: Secondary | ICD-10-CM | POA: Diagnosis not present

## 2022-05-21 DIAGNOSIS — I951 Orthostatic hypotension: Secondary | ICD-10-CM | POA: Diagnosis not present

## 2022-05-21 LAB — CULTURE, BLOOD (ROUTINE X 2)
Culture: NO GROWTH
Culture: NO GROWTH

## 2022-05-21 NOTE — Progress Notes (Addendum)
    Chronic Care Management Pharmacy Assistant   Name: ALIF PETRAK  MRN: [id number] DOB: 08-26-1934  Reason for Encounter: CCM [facility] Follow-Up)   Medications: Outpatient Encounter Medications as of 05/21/2022  Medication Sig   Cholecalciferol (VITAMIN D3) 1000 units CAPS Take 1 capsule (1,000 Units total) by mouth daily.   feeding supplement (BOOST HIGH PROTEIN) LIQD Take 237 mLs by mouth daily as needed (about 3 times a week).   fexofenadine (ALLEGRA ALLERGY) 180 MG tablet Take 1 tablet (180 mg total) by mouth daily.   finasteride (PROSCAR) 5 MG tablet Take 1 tablet (5 mg total) by mouth daily.   fludrocortisone (FLORINEF) 0.1 MG tablet Take 1 tablet (0.1 mg total) by mouth daily.   FLUoxetine (PROZAC) 20 MG capsule Take 1 capsule (20 mg total) by mouth daily.   guaiFENesin-dextromethorphan (ROBITUSSIN DM) 100-10 MG/5ML syrup Take 10 mLs by mouth every 4 (four) hours as needed for cough.   midodrine (PROAMATINE) 10 MG tablet Take 1 tablet (10 mg total) by mouth 3 (three) times daily before meals.   Polyethylene Glycol 3350 (MIRALAX PO) Take 17 g by mouth daily.   Probiotic Product (ALIGN PO) Take by mouth daily.   Sennosides (SENOKOT PO) Take 1 tablet by mouth at bedtime.   vitamin B-12 (V-R VITAMIN B-12) 500 MCG tablet Take 1 tablet (500 mcg total) by mouth daily. (Patient taking differently: Take 500 mcg by mouth at bedtime.)   No facility-administered encounter medications on file as of 05/21/2022.    Reviewed hospital notes for details of recent visit. Patient has been contacted by Transitions of Care team: No  Admitted to the ED on 05/17/22 transferred to ICU. Discharge date was 05/17/22. Discharged from [facility].   Primary discharge diagnosis: Hypotension Patient was discharged to Home - wife stated in phone call to Dr. Danise Mina on 05/18/22 she told hospital to release patient.   Brief summary of hospital course: ED via [facility] from home for Bradycardia  and hypotension. Pt initial BP 64/42 with EMS.  As per cardiology evaluation, patient is having frequent PVCs and ventricular bigeminy that is likely because of perceived bradycardia in the monitor showing low heart rate.  24 hours in the telemetry with reasonable heart rate.  Patient has severe orthostatic hypotension and currently on midodrine 10 mg 3 times a day.  He was also started on Florinef 0.1 mg daily that improved his orthostatic and currently asymptomatic.   As per cardiology recommendations, patient is not a candidate for pacemaker or not required any pacemaker at this time.  Added Florinef to the regimen for orthostatic hypotension and he was discharged home. He is not on anticoagulation, side effects outweigh benefits  New?Medications Started at [facility] Discharge:?? -started fludrocortisone (FLORINEF) 0.1 MG tablet   Medication Changes at Hospital Discharge: No changes.  Medications Discontinued at Hospital Discharge: None stopped  Medications that remain the same after Hospital Discharge:??  -All other medications will remain the same.    [place] appointment on 07/14/20223 Cardiology appointment on 06/20/22 and 07/22/22 for Remote Device Check Neurology appointment on 07/02/2022 PCP appointment on 08/19/2022 for AWV PCP appointment on 08/21/2022 for FU  [name], CPP notified  Marijean Niemann, [place] Pharmacy Assistant [phone]  [name], PharmD notified and will determine if action is needed.   Pharmacist addendum: Patient has appropriate follow up scheduled. Nothing further needed.  [name], PharmD, BCACP Clinical Pharmacist [place] [facility] at [facility] [phone]

## 2022-05-23 ENCOUNTER — Telehealth: Payer: Self-pay

## 2022-05-23 DIAGNOSIS — I480 Paroxysmal atrial fibrillation: Secondary | ICD-10-CM | POA: Diagnosis not present

## 2022-05-23 DIAGNOSIS — F0393 Unspecified dementia, unspecified severity, with mood disturbance: Secondary | ICD-10-CM | POA: Diagnosis not present

## 2022-05-23 DIAGNOSIS — I951 Orthostatic hypotension: Secondary | ICD-10-CM | POA: Diagnosis not present

## 2022-05-23 DIAGNOSIS — F32A Depression, unspecified: Secondary | ICD-10-CM | POA: Diagnosis not present

## 2022-05-23 DIAGNOSIS — I4892 Unspecified atrial flutter: Secondary | ICD-10-CM | POA: Diagnosis not present

## 2022-05-23 NOTE — Telephone Encounter (Signed)
PC SW outreached patients spouse to reschedule in home PC visit with [facility] RN.  Visit rescheduled for Fri 7/21 '@1pm'$ .

## 2022-05-24 ENCOUNTER — Other Ambulatory Visit: Payer: Medicare Other

## 2022-05-24 LAB — CUP PACEART REMOTE DEVICE CHECK
Date Time Interrogation Session: 20230713232836
Implantable Pulse Generator Implant Date: 20191120

## 2022-05-28 DIAGNOSIS — I951 Orthostatic hypotension: Secondary | ICD-10-CM | POA: Diagnosis not present

## 2022-05-28 DIAGNOSIS — I4892 Unspecified atrial flutter: Secondary | ICD-10-CM | POA: Diagnosis not present

## 2022-05-28 DIAGNOSIS — F32A Depression, unspecified: Secondary | ICD-10-CM | POA: Diagnosis not present

## 2022-05-28 DIAGNOSIS — F0393 Unspecified dementia, unspecified severity, with mood disturbance: Secondary | ICD-10-CM | POA: Diagnosis not present

## 2022-05-28 DIAGNOSIS — I480 Paroxysmal atrial fibrillation: Secondary | ICD-10-CM | POA: Diagnosis not present

## 2022-05-31 ENCOUNTER — Other Ambulatory Visit: Payer: Medicare Other

## 2022-05-31 VITALS — BP 102/60 | HR 55 | Temp 97.6°F | Resp 16

## 2022-05-31 DIAGNOSIS — Z515 Encounter for palliative care: Secondary | ICD-10-CM

## 2022-05-31 NOTE — Progress Notes (Unsigned)
PATIENT NAME: Dylan [name] DOB: [birth year] MRN: [id number]  PRIMARY CARE PROVIDER: [name], MD  RESPONSIBLE PARTY:  Acct ID - Guarantor Home Phone Work Phone Relationship Acct Type  [id number] [name], [name]  Self P/F     [address] [place], [place] [address]    Follow up visit completed with patient nad wife.  Dementia:  Worsening since last hospitalization.  Increase in agitation now in the mornings.  No dementia medications currently prescribed.  Constipation:  Miralax and probiotics given daily and 2 senna at hs.  Patient is having a bm qod now.  No further issues with constipation.   Adult Day Program:  Patient continues to go to [facility] program.  HISTORY OF PRESENT ILLNESS:    CODE STATUS: DNR ADVANCED DIRECTIVES: Yes MOST FORM: No PPS: 40%   PHYSICAL EXAM:   VITALS: Today's Vitals   05/31/22 1329  BP: 102/60  Pulse: (!) 55  Resp: 16  Temp: 97.6 F (36.4 C)  SpO2: 96%  PainSc: 0-No pain    LUNGS: {SYSTEM LUNGS ADULT/PED EXAM:21906} CARDIAC: {Mis exam cardio:32073}} *** EXTREMITIES: {Exam; extremity:10330} SKIN: {Findings; skin exam-one line:31329::"Skin color, texture, turgor normal. No rashes or lesions"}  NEURO: {Findings; ROS neuro:30532::"negative"}       Dylan Burton, RN

## 2022-06-04 ENCOUNTER — Ambulatory Visit: Payer: Medicare Other | Admitting: Cardiology

## 2022-06-06 ENCOUNTER — Ambulatory Visit: Payer: Medicare Other | Admitting: Physician Assistant

## 2022-06-06 ENCOUNTER — Encounter: Payer: Self-pay | Admitting: Physician Assistant

## 2022-06-06 VITALS — BP 110/54 | HR 71 | Wt 183.6 lb

## 2022-06-06 DIAGNOSIS — I4892 Unspecified atrial flutter: Secondary | ICD-10-CM | POA: Diagnosis not present

## 2022-06-06 DIAGNOSIS — I493 Ventricular premature depolarization: Secondary | ICD-10-CM | POA: Diagnosis not present

## 2022-06-06 DIAGNOSIS — Z4509 Encounter for adjustment and management of other cardiac device: Secondary | ICD-10-CM

## 2022-06-06 DIAGNOSIS — I951 Orthostatic hypotension: Secondary | ICD-10-CM

## 2022-06-06 MED ORDER — FLUDROCORTISONE ACETATE 0.1 MG PO TABS: 0.1000 mg | ORAL_TABLET | Freq: Every day | ORAL | 3 refills | Status: DC

## 2022-06-06 NOTE — Progress Notes (Signed)
Cardiology Office Note Date:  06/06/2022  Patient ID:  Dylan [name] [birth year], MRN [id number] PCP:  [name], MD  Cardiologist:  Dr. Bettina Gavia EP: Dr. Curt Bears   Chief Complaint:  hospital follow up  History of Present Illness: Dylan [name] is a 88 y.o. male with history of HLD, dementia, CKD (III), RBBB and syncope > ILR implant  He was admitted to [facility] 05/31/20 with acute onset of worsening generalized weakness with associated mild dyspnea. In the ER, VSS WBC 23.1, lactic acid 3.9, mild bump in Creat 1.59 (baselin 1.18) Started on IFV and antibiotics Suspect pneumonia Discharged on PO antibiotics and started on midodrine as well Discharged 06/02/20.  He comes today to be seen for Dr. Curt Bears, last seen by him 09/03/21.  He had a recent hospitalization for C-Diff, no cardiac issues, remained on midodrine for his orthostatic hypotension, no recurrent syncope. No changes were made.      09/25/21, ILR noted Aflutter w/CVR and planned to come in to discuss [place].  I saw him Nov 2022 He is accompanied by his wife. He has known baseline dementia and defers to his wife for answers.  She mentions he sleep poorly and tends to wander during the night He though denies any kind oc CP or SOB Today's BP is described as great for him He has not had recurrent syncope He will sometimes stumble because he likes his shoes tied loose, though does not fall.  His wife tries to keep them tied tight, No hx of bleeding or hematology history New onset AFlutter, started on a/c, he was rate controlled and unaware, did not want to pursue procedure, planned for a 1 mo f/u and DCCV once a/c appropriately.  (No f/u done)  Hospitalized 03/16/22 - 03/18/22 with c/o weakness, N/V/D, fever, found w/COVID  Hospitalized 04/16/22 - 04/23/22 after a syncopal event (x2), suspect orthostatic as major contributor, cardiology participated in his care, loop was interrogated with no HR/rhythm findings to explain  syncope. He was treated with fluids, and midodrine increased. His Eliquis stopped with concerns of recurrent falls/syncope, traumatic bleeding, this to be re-addressed by primary cardiology team at follow up  He was reported to have HRs to 30's by pulse ox,BP cuff, felt to be 2/2 PVCs  I saw him 04/25/22 He comes today accompanied by hs wife Ambulated in. His dementia is advancing, participates little in conversation, answers yes to everything, some yes answers are more enthusiastic though. He appropriately tells me his wife is with him. He has not had any falls or faints since home. His constipation is better back on his home bowel regime He eats very well but does not drink much water at all, his wife does well trying alternatives like watermelon, grapes, and flavored water. She checks his BP prior to getting out of bed and waits until his BP is good enough to let his sit, then stand slowly. She does not think he will wear thigh sleeves, but would perhaps do  an abdominal binder.  Loop without brady/pause episodes, discussed management strategies for orthostatic hypotension New Aflutter burden 1.4%, some false, d/w wife, given syncope, falls, not started on a/c.  He saw E. Dick, NP 04/30/22, no recurrent syncope, wife felt he was doing better. Again in d/w wife, no a/c going forward, Eliquis stopped Planned for an annual cardiology visit   Hospitalized 05/16/22 - 05/17/22 with observed bradycardia and low BPs. ? No symptoms.  Observed to have PVCs and this felt to  be the cause of observed brady rates at home, florinef added to his regime. Cardiology consulted, no particular recommendations, agreed with florinef.  TODAY He is with his wife He is pleasant and cooperative, advanced dementia He has not had recurrent syncope BP generally better, occasionally his BP gets low and she has him wait in bed until after his midodrine is in. He is very good about keeping the abdominal binder on when  up Loves salty snacks but does not drink much if any water despite his wifes urging/attempts to get him to drink.  Device information MDT LNQII  Implanted 09/30/2018 for syncope  Past Medical History:  Diagnosis Date   Basal cell carcinoma 08/31/2020   Mid forehead - ED&C    Basal cell carcinoma of right nasal sidewall 12/15/2014   Bilateral hearing loss 08/03/2018   S/p audiology evaluation, has new hearing aides.   Bradycardia    a. 08/2018 ETT: Poor ex tolerance (2:10); b. 09/2018 s/p MDT Linq.   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 09/2018 s/p MDT Linq; b. 10/2021 Echo: EF 55-60%, no rwma, nl RV fxn, mild MR, triv AI.   Thrombocytopenia ([place]) 02/21/2011   periph smear stable 02/2020   Vitamin B12 deficiency 08/03/2018   Completed b12 shots 09/2018, maintaining  levels on oral replacement (dissolvable tablets) 01/2019   Vitamin D deficiency 05/19/2018    Past Surgical History:  Procedure Laterality Date   BASAL CELL CARCINOMA EXCISION Right 01/2015   with reconstructive surgery around eye   CATARACT EXTRACTION Left 03/2017   ETT  09/2018   negative for ischemia, extremely poor exercise tolerance ([place])   FOOT SURGERY Left 1998   metatarsal amputation after injury   KNEE ARTHROSCOPY  1993   Right   KNEE ARTHROSCOPY  01/2001   Left   LOOP RECORDER INSERTION N/A 09/30/2018   Procedure: LOOP RECORDER INSERTION;  Surgeon: Constance Haw, MD;  Location: [facility];  Service: Cardiovascular;  Laterality: N/A;    Current Outpatient Medications  Medication Sig Dispense Refill   Cholecalciferol (VITAMIN D3) 1000 units CAPS Take 1 capsule (1,000 Units total) by mouth daily. 30 capsule    feeding supplement (BOOST HIGH PROTEIN) LIQD Take 237 mLs by mouth daily as needed (about 3 times a week).  0   fexofenadine (ALLEGRA ALLERGY) 180  MG tablet Take 1 tablet (180 mg total) by mouth daily.     finasteride (PROSCAR) 5 MG tablet Take 1 tablet (5 mg total) by mouth daily. 90 tablet 3   fludrocortisone (FLORINEF) 0.1 MG tablet Take 1 tablet (0.1 mg total) by mouth daily. 30 tablet 0   FLUoxetine (PROZAC) 20 MG capsule Take 1 capsule (20 mg total) by mouth daily. 30 capsule 6   guaiFENesin-dextromethorphan (ROBITUSSIN DM) 100-10 MG/5ML syrup Take 10 mLs by mouth every 4 (four) hours as needed for cough. 118 mL 0   midodrine (PROAMATINE) 10 MG tablet Take 1 tablet (10 mg total) by mouth 3 (three) times daily before meals. 90 tablet 1   Polyethylene Glycol 3350 (MIRALAX PO) Take 17 g by mouth daily.     Probiotic Product (ALIGN PO) Take by mouth daily.     Sennosides (SENOKOT PO) Take 1 tablet by mouth at bedtime.     vitamin B-12 (V-R VITAMIN B-12) 500 MCG tablet Take 1 tablet (500 mcg total) by mouth daily. (Patient taking differently: Take 500  mcg by mouth at bedtime.)     No current facility-administered medications for this visit.    Allergies:   Doxepin and Lincomycin hcl   Social History:  The patient  reports that he quit smoking about 61 years ago. His smoking use included cigarettes. He has never used smokeless tobacco. He reports that he does not drink alcohol and does not use drugs.   Family History:  The patient's family history includes Cataracts in his sister; Congestive Heart Failure in his father and mother; Leukemia in his brother; Stroke (age of onset: 32) in his father.  ROS:  Please see the history of present illness. All other systems are reviewed and otherwise negative.   PHYSICAL EXAM:  VS:  There were no vitals taken for this visit. BMI: There is no height or weight on file to calculate BMI. Well nourished, well developed, in no acute distress  HEENT: normocephalic, atraumatic  Neck: no JVD, carotid bruits or masses Cardiac: regularly irregular, no significant murmurs, no rubs, or gallops Lungs:   CTA b/l, no wheezing, rhonchi or rales  Abd: soft, nontender MS: no deformity, age appropriate, perhaps advanced atrophy Ext:  no edema  Skin: warm and dry, no rash Neuro:  known advanced dementia,  No gross focal motor deficits appreciated Psych: euthymic mood, full affect  ILR site is stable, no tethering or discomfort   EKG:  not done today  ILR interrogation done today and reviewed by myself: Brady episodes are artifact and bigeminy, AFib episode looks the same/ectopy, not clear, but suspect not true Afib  11/08/2021: TTE  1. Left ventricular ejection fraction, by estimation, is 55 to 60%. Left  ventricular ejection fraction by 2D MOD biplane is 59.7 %. The left  ventricle has normal function. The left ventricle has no regional wall  motion abnormalities. Left ventricular  diastolic parameters were normal.   2. Right ventricular systolic function is normal. The right ventricular  size is normal.    3. The mitral valve is normal in structure. Mild mitral valve  regurgitation.   4. The aortic valve was not well visualized. Aortic valve regurgitation  is trivial.   5. The inferior vena cava is normal in size with greater than 50%  respiratory variability, suggesting right atrial pressure of 3 mmHg.   09/04/2018: ETT Blood pressure demonstrated a normal response to exercise. Clinically and electrically negative for ischemia Extremelly poor exercise tolerance Dr.  [place] commented:  Poor exercise tolerance.  No evidence of chronotropic incompetence.  Will need Linq monitoring for episode of syncope.   14 day monitor Aug 2019  Indication, syncope   Baseline rhythm, sinus minimum maximum and average rate 44 136 and 64 bpm   Supraventricular ectopy, less than 1% there were frequent 297 runs of APCs longest lasting 11.2 seconds with a rate of 122 bpm the fastest 5 complexes a rate of 169 bpm.  There are no episodes of atrial fibrillation or flutter.   Ventricular ectopy, rare isolated APCs 5 couplets and one triplet less than 1% burden. There were no episodes of sinus node or AV nodal block There are no symptomatic events Conclusion brief runs of atrial premature contractions   Recent Labs: 03/17/2022: B Natriuretic Peptide 296.8 03/18/2022: TSH 0.753 04/22/2022: Magnesium 2.0 05/17/2022: ALT 15; BUN 17; Creatinine, Ser 1.27; Hemoglobin 13.9; Platelets 80; Potassium 4.1; Sodium 141  08/20/2021: Direct LDL 140.0 04/22/2022: Cholesterol 168; HDL 30; LDL Cholesterol 109; Total CHOL/HDL Ratio 5.6; Triglycerides 146; VLDL 29   CrCl cannot be calculated (Unknown ideal weight.).   Wt Readings from Last 3 Encounters:  05/17/22 180 lb 5.4 oz (81.8 kg)  04/30/22 176 lb (79.8 kg)  04/26/22 174 lb (78.9 kg)     Other studies reviewed: Additional studies/records reviewed today include: summarized above  ASSESSMENT AND PLAN:  1. Syncope     Baseline conduction system disease, RBBB, LAD      No true brady or pause episodes     Orthostatic hypotension has been demonstrated, now on midodrine and florinef  Re-discussed treatment strategies at length His wife is doing a great job Hard with his advancing dementia. Continue abdominal binder. Will continue to try and encourage hydration   Discussed dilemma between treating PVC and his low BP's/orthostatic hypotension I think the orthostatic issue is the bigger problem Try to hold off on meds for the PVCs   2. New Aflutter CHA2DS2Vasc is 2 We have previously stroke risk with Afib and fall/traumatic bleeding risk I agree at least right now, his fall and injury risk is higher His wife is most comfortable as well staying off eliquis.    Disposition: We can have him back soon to try and keep him out of the hospital    Current medicines are reviewed at length with the patient today.  The patient did not have any concerns regarding medicines.  Venetia Night, PA-C 06/06/2022 5:31 AM     [facility] [address] [place] [address] [phone] (office)  [phone] (fax)

## 2022-06-06 NOTE — Patient Instructions (Addendum)
Medication Instructions:  Your physician recommends that you continue on your current medications as directed. Please refer to the Current Medication list given to you today.  *If you need a refill on your cardiac medications before your next appointment, please call your pharmacy*   Lab Work: None  If you have labs (blood work) drawn today and your tests are completely normal, you will receive your results only by: [place] (if you have MyChart) OR A paper copy in the mail If you have any lab test that is abnormal or we need to change your treatment, we will call you to review the results.   Follow-Up: At [facility], you and your health needs are our priority.  As part of our continuing mission to provide you with exceptional heart care, we have created designated Provider Care Teams.  These Care Teams include your primary Cardiologist (physician) and Advanced Practice Providers (APPs -  Physician Assistants and Nurse Practitioners) who all work together to provide you with the care you need, when you need it.  Your next appointment:   07/05/22

## 2022-06-09 LAB — CUP PACEART REMOTE DEVICE CHECK
Date Time Interrogation Session: 20230727150909
Implantable Pulse Generator Implant Date: 20191120

## 2022-06-11 MED ORDER — MIDODRINE HCL 10 MG PO TABS: 10.0000 mg | ORAL_TABLET | Freq: Three times a day (TID) | ORAL | 3 refills | Status: DC

## 2022-06-11 NOTE — Telephone Encounter (Signed)
Spoke to pts wife. Informed wife that our office has never prescribed nor refilled this medication so I am not sure if should go through PCP instead.   Wife states they have seen so many doctors in the last year it is hard to keep track. Aware I will forward to [facility] PA, who has been seeing pt recently, for advisement on this matter. Wife aware I will let her know once reviewed/advised on. She is agreeable to plan.

## 2022-06-19 NOTE — Progress Notes (Signed)
Carelink Summary Report / Loop Recorder 

## 2022-06-20 ENCOUNTER — Telehealth: Payer: Self-pay

## 2022-06-20 NOTE — Telephone Encounter (Signed)
ILR reached RRT 06/18/22. Called and advised patients wife and discussed options of explant or leave device in. States she will discuss with patient. Advised she can call us back after patient makes decision. Appreciative of call.

## 2022-07-02 ENCOUNTER — Encounter: Payer: Self-pay | Admitting: Diagnostic Neuroimaging

## 2022-07-02 ENCOUNTER — Ambulatory Visit: Payer: Medicare Other | Admitting: Diagnostic Neuroimaging

## 2022-07-02 VITALS — BP 128/71 | HR 53 | Wt 182.1 lb

## 2022-07-02 DIAGNOSIS — F03918 Unspecified dementia, unspecified severity, with other behavioral disturbance: Secondary | ICD-10-CM

## 2022-07-02 MED ORDER — QUETIAPINE FUMARATE 25 MG PO TABS: 12.5000 mg | ORAL_TABLET | Freq: Every evening | ORAL | 6 refills | Status: DC | PRN

## 2022-07-02 NOTE — Progress Notes (Signed)
[facility]  PATIENT: Dylan [name] DOB: 1934/08/06  REFERRING CLINICIAN: [name], MD HISTORY FROM: patient  REASON FOR VISIT: new consult   HISTORICAL  CHIEF COMPLAINT:  Chief Complaint  Patient presents with   New Patient (Initial Visit)    His wife states that he was diagnosed 3 years ago and his dementia started getting worse. She noticed something differently daily. She also reports within the last 6 months she has has noticed a lot of changes. Room 7 with wife    HISTORY OF PRESENT ILLNESS:   88 year old male here for evaluation of dementia.  Gradual onset progressive short-term memory loss, confusion, mood and behavior changes since 2020.  Now having compulsory masturbation behavior at night, leading to self-harm, difficulty with urination, and daytime fatigue.  Wife is primary caregiver.  They have palliative care nurse home visits now.  Tried fluoxetine, without significant benefit.  Also having some issues with low blood pressure on Florinef and midodrine.  Tried Aricept and Namenda but these caused side effects.   REVIEW OF SYSTEMS: Full 14 system review of systems performed and negative with exception of: as per HPI.  ALLERGIES: Allergies  Allergen Reactions   Doxepin Other (See Comments)    Lethargy, overly sedating at low dose   Lincomycin Hcl Rash    HOME MEDICATIONS: Outpatient Medications Prior to Visit  Medication Sig Dispense Refill   Cholecalciferol (VITAMIN D3) 1000 units CAPS Take 1 capsule (1,000 Units total) by mouth daily. 30 capsule    fexofenadine (ALLEGRA ALLERGY) 180 MG tablet Take 1 tablet (180 mg total) by mouth daily.     finasteride (PROSCAR) 5 MG tablet Take 1 tablet (5 mg total) by mouth daily. 90 tablet 3   fludrocortisone (FLORINEF) 0.1 MG tablet Take 1 tablet (0.1 mg total) by mouth daily. 90 tablet 3   FLUoxetine (PROZAC) 20 MG capsule Take 1 capsule (20 mg total) by mouth daily. 30 capsule 6    guaiFENesin-dextromethorphan (ROBITUSSIN DM) 100-10 MG/5ML syrup Take 10 mLs by mouth every 4 (four) hours as needed for cough. 118 mL 0   midodrine (PROAMATINE) 10 MG tablet Take 1 tablet (10 mg total) by mouth 3 (three) times daily before meals. 270 tablet 3   Polyethylene Glycol 3350 (MIRALAX PO) Take 17 g by mouth daily.     Probiotic Product (ALIGN PO) Take by mouth daily.     Sennosides (SENOKOT PO) Take 1 tablet by mouth at bedtime.     vitamin B-12 (V-R VITAMIN B-12) 500 MCG tablet Take 1 tablet (500 mcg total) by mouth daily. (Patient taking differently: Take 500 mcg by mouth at bedtime.)     feeding supplement (BOOST HIGH PROTEIN) LIQD Take 237 mLs by mouth daily as needed (about 3 times a week).  0   No facility-administered medications prior to visit.    PAST MEDICAL HISTORY: Past Medical History:  Diagnosis Date   Basal cell carcinoma 08/31/2020   Mid forehead - ED&C    Basal cell carcinoma of right nasal sidewall 12/15/2014   Bilateral hearing loss 08/03/2018   S/p audiology evaluation, has new hearing aides.   Bradycardia    a. 08/2018 ETT: Poor ex tolerance (2:10); b. 09/2018 s/p MDT Linq.   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 09/2018 s/p MDT Linq; b. 10/2021 Echo: EF 55-60%, no rwma, nl RV fxn, mild MR, triv AI.   Thrombocytopenia ([place]) 02/21/2011   periph smear stable 02/2020   Vitamin B12 deficiency 08/03/2018   Completed b12 shots 09/2018, maintaining levels on oral replacement (dissolvable tablets) 01/2019   Vitamin D deficiency 05/19/2018    PAST SURGICAL HISTORY: Past Surgical History:  Procedure Laterality Date   BASAL CELL CARCINOMA EXCISION Right 01/2015   with reconstructive surgery around eye   CATARACT EXTRACTION Left 03/2017   ETT  09/2018   negative for ischemia, extremely poor exercise tolerance ([place])   FOOT SURGERY Left 1998   metatarsal amputation after injury   KNEE ARTHROSCOPY  1993   Right   KNEE ARTHROSCOPY  01/2001   Left   LOOP RECORDER INSERTION N/A 09/30/2018   Procedure: LOOP RECORDER INSERTION;  Surgeon: Constance Haw, MD;  Location: [facility];  Service: Cardiovascular;  Laterality: N/A;    FAMILY HISTORY: Family History  Problem Relation Age of Onset   Congestive Heart Failure Mother    Stroke Father 71   Congestive Heart Failure Father    Cataracts Sister    Leukemia Brother     SOCIAL HISTORY: Social History   Socioeconomic History   Marital status: Married    Spouse name: Not on file   Number of children: 3   Years of education: Not on file   Highest education level: Not on file  Occupational History   Occupation: Retired    Comment: 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, minerals, Glass blower/designer  Tobacco Use   Smoking status: Former    Types: Cigarettes    Quit  date: 11/11/1960    Years since quitting: 61.6   Smokeless tobacco: Never  Vaping Use   Vaping Use: Never used  Substance and Sexual Activity   Alcohol use: No   Drug use: No   Sexual activity: Not on file  Other Topics Concern   Not on file  Social History Narrative   Lives with wife [place])   One stepson   Occ: retired, Furniture conservator/restorer, 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 Wt Readings from Last 3 Encounters:  07/02/22 182 lb 2 oz (82.6 kg)  06/06/22 183 lb 9.6 oz (83.3 kg)  05/17/22 180 lb 5.4 oz (81.8 kg)   Patient is in no distress; well developed, nourished and groomed; neck is supple   NEUROLOGIC: MENTAL STATUS:     08/02/2018    1:35 PM  MMSE - Mini Mental State Exam  Orientation to time 5  Orientation to Place 5  Registration 3  Attention/ Calculation 0  Recall 3  Language- name 2 objects 0  Language- repeat 1  Language- follow 3 step command 3  Language- read & follow direction 0  Write a sentence 0  Copy design 0  Total score 20   awake, alert, oriented to person DECR FLUENCY  5th - facial sensation symmetric 7th - facial strength symmetric 8th - hearing intact 9th - palate elevates symmetrically, uvula midline 11th - shoulder shrug symmetric 12th - tongue protrusion midline  MOTOR:  normal bulk and tone, full strength in the BUE, BLE  SENSORY:  normal and symmetric to light  touch   GAIT/STATION:  narrow based gait; SLIGHTLY UNSTEADY     DIAGNOSTIC DATA (LABS, IMAGING, TESTING) - I reviewed patient records, labs, notes, testing and imaging myself where available.  Lab Results  Component Value Date   WBC 5.7 05/17/2022   HGB 13.9 05/17/2022   HCT 40.8 05/17/2022   MCV 91.9 05/17/2022   PLT 80 (L) 05/17/2022      Component Value Date/Time   NA 141 05/17/2022 0649   K 4.1 05/17/2022 0649   CL 108 05/17/2022 0649   CO2 27 05/17/2022 0649   GLUCOSE 86 05/17/2022 0649   BUN 17 05/17/2022 0649   CREATININE 1.27 (H) 05/17/2022 0649   CALCIUM 8.7 (L) 05/17/2022 0649   PROT 6.1 (L) 05/17/2022 0649   ALBUMIN 3.6 05/17/2022 0649   AST 19 05/17/2022 0649   ALT 15 05/17/2022 0649   ALKPHOS 65 05/17/2022 0649   BILITOT 1.1 05/17/2022 0649   GFRNONAA 55 (L) 05/17/2022 0649   GFRAA >60 06/02/2020 0433   Lab Results  Component Value Date   CHOL 168 04/22/2022   HDL 30 (L) 04/22/2022   LDLCALC 109 (H) 04/22/2022   LDLDIRECT 140.0 08/20/2021   TRIG 146 04/22/2022   CHOLHDL 5.6 04/22/2022   No results found for: "HGBA1C" Lab Results  Component Value Date   BHALPFXT02 409 08/20/2021   Lab Results  Component Value Date   TSH 0.753 03/18/2022    04/16/22 CT head [I reviewed images myself and agree with interpretation. -VRP]  1. No acute intracranial abnormality. 2. Chronic small vessel ischemic disease and cerebral atrophy. 3. No evidence for cervical spine fracture or subluxation. 4. Cervical degenerative disc disease.    ASSESSMENT AND PLAN  88 y.o. year old male here with:  Meds tried: donepezil, memantine, sertraline   Dx:  1. Dementia with behavioral disturbance     PLAN:  MODERATE DEMENTIA WITH BEHAVIOR CHANGES - continue supportive care and palliative care - continue fluoxetine '20mg'$  daily (could replace with mirtazapine)  - consider trial of quetiapine 12.'5mg'$  at bedtime (due to insomnia, agitation, compulsory masturbation  resulting in self-harm); black box  warning reviewed, but benefits may outweigh risks see --> Treatment of Inappropriate Sexual Behavior in Dementia - [place] ([email or web address])   Meds ordered this encounter  Medications   QUEtiapine (SEROQUEL) 25 MG tablet    Sig: Take 0.5-1 tablets (12.5-25 mg total) by mouth at bedtime as needed (agitation, insomnia).    Dispense:  30 tablet    Refill:  6   Return for pending if symptoms worsen or fail to improve, return to PCP.    Penni Bombard, MD 3/97/9536, 92:23 AM Certified in Neurology, Neurophysiology and Neuroimaging  [facility] [address] [place], [place] [address] [phone]

## 2022-07-02 NOTE — Patient Instructions (Signed)
MODERATE DEMENTIA WITH BEHAVIOR CHANGES - continue supportive care and palliative care  - continue fluoxetine '20mg'$  daily (could replace with mirtazapine)   - consider trial of quetiapine 12.'5mg'$  at bedtime (due to insomnia, agitation, compulsory masturbation resulting in self-harm); black box warning reviewed, but benefits may outweigh risks

## 2022-07-04 ENCOUNTER — Encounter: Payer: Medicare Other | Admitting: Physician Assistant

## 2022-07-04 NOTE — Telephone Encounter (Signed)
Spoke with patients wife she is very concerned about patient note being monitored and appointments with [name] being cancelled as Joseph Art has been the provider refilling his prescriptions, apt scheduled with Renee on 07/18/2022 at 1:55pm

## 2022-07-05 ENCOUNTER — Encounter: Payer: Medicare Other | Admitting: Physician Assistant

## 2022-07-06 ENCOUNTER — Encounter: Payer: Self-pay | Admitting: Family Medicine

## 2022-07-11 ENCOUNTER — Other Ambulatory Visit: Payer: Medicare Other | Admitting: Nurse Practitioner

## 2022-07-11 ENCOUNTER — Encounter: Payer: Self-pay | Admitting: Nurse Practitioner

## 2022-07-11 DIAGNOSIS — R5381 Other malaise: Secondary | ICD-10-CM | POA: Diagnosis not present

## 2022-07-11 DIAGNOSIS — Z515 Encounter for palliative care: Secondary | ICD-10-CM

## 2022-07-11 DIAGNOSIS — F02818 Dementia in other diseases classified elsewhere, unspecified severity, with other behavioral disturbance: Secondary | ICD-10-CM

## 2022-07-11 NOTE — Progress Notes (Signed)
[facility] Consult Note Telephone: [phone]  Fax: [phone]    Date of encounter: 07/11/22 2:56 PM PATIENT NAME: Dylan [name] [address] [place] [address]   [phone] (home)  DOB: 1934-11-06 MRN: [id number] PRIMARY CARE PROVIDER:    [name], MD,  [place] [address] [phone]  RESPONSIBLE PARTY:    Contact Information     Name Relation Home Work Mobile   Gandy Spouse [phone]  [phone]   [place]at[place], Rathman   [phone]      Due to the COVID-19 crisis, this visit was done via telemedicine from my office and it was initiated and consent by this patient and or family.  I connected with Dylan [name] with JOSEMIGUEL GRIES OR PROXY on 07/11/22 by telephone as video not available enabled telemedicine application and verified that I am speaking with the correct person using two identifiers.   I discussed the limitations of evaluation and management by telemedicine. The patient expressed understanding and agreed to proceed.  Palliative Care was asked to follow this patient by consultation request of  [name], MD to address advance care planning and complex medical decision making. This is a follow up visit.                                  ASSESSMENT AND PLAN / RECOMMENDATIONS: Symptom Management/Plan:   ACP:  DNR in place   2.   Debility, ongoing with frequently sliding off the cough, or sitting on the floor. Educated on fall risk; discussed mobility, safety   3. Dementia with Behavioral Disturbances:  with worsening behaviors discussed recently started seroquel, discussed alternative modalities, talked about caregiver stess; fatigue with coping strategies. We talked about expectations with overall ongoing disease progression. We talked about [facility] daycare program he goes to three times a week, does well there. Support provided.    Follow up Palliative  Care Visit: Palliative care will continue to follow for complex medical decision making, advance care planning, and clarification of goals. Return 12 weeks or prn.   I spent 31 minutes providing this consultation. More than 50% of the time in this consultation was spent in counseling and care coordination   PPS: 40% Chief Complaint: Follow up palliative consult for complex medical decision making   HISTORY OF PRESENT ILLNESS:  Dylan [name] is a 88 y.o. year old male  with including dementia, CKD stage III, depression. Dylan [name] resides at home with his wife who is his primary caregiver. Dylan [name] was hospitalized 03/16/2022 to 03/18/2022 for covid; stabilized and d/c home. Hospitalized 04/16/2022 to 04/23/2022 for syncope with workup significant for orthostatic hypotension, aflutter; stabilized and d/c home. Hospitalized 05/16/2022 to 05/17/2022 for orthostatic hypotension on midodrine, episodes of bradycardia; not a candidate per cardiology for a pacemaker with stabilized and d/c home. I called Ms. Mcgahee for telemedicine, telephonic visit as video not available. We talked about recent hospitalizations with multiple ED visits for falls. We talked about Dylan [name] going to [facility] daycare three times a week, behaving well there just when he comes home is more difficult. We talked about alternative modalities redirection, caregiver stress, fatigue, coping strategies, medications with recently started on seroquel. We talked about ros, weights with no weight loss actually gain. We talked about nutrition, supplements, appetite. We talked about cognitive and functional abilities, requires assistance with bathing, he  helps more getting himself dressed; incontinence bowel and bladder. Dylan [name] does feed himself. We talked about medical goals of care; discussed role pc in poc. We talked about f/u pc visit 12 weeks, or sooner if declines. Dylan [name] in agreement. Therapeutic listening, emotional support provided.  Questions answered.    History obtained from review of EMR, discussion with Dylan with Dylan. Zayed.  I reviewed available labs, medications, imaging, studies and related documents from the EMR.  Records reviewed and summarized above.    ROS 10 point system reviewed with Ms Sobczyk all negative except HPI   Physical Exam: Deferred  Thank you for the opportunity to participate in the care of Dylan. Golson.  The palliative care team will continue to follow. Please call our office at [phone] if we can be of additional assistance.   Temia Debroux Ihor Gully, NP

## 2022-07-14 NOTE — Progress Notes (Signed)
Cardiology Office Note Date:  07/14/2022  Patient ID:  Dylan [name], Dylan [name] [birth year], MRN [id number] PCP:  [name], MD  Cardiologist:  Dr. Bettina Gavia EP: Dr. Curt Bears   Chief Complaint:  hospital follow up  History of Present Illness: Dylan [name] is a 88 y.o. male with history of HLD, dementia, CKD (III), RBBB and syncope > ILR implant  He was admitted to [facility] 05/31/20 with acute onset of worsening generalized weakness with associated mild dyspnea. In the ER, VSS WBC 23.1, lactic acid 3.9, mild bump in Creat 1.59 (baselin 1.18) Started on IFV and antibiotics Suspect pneumonia Discharged on PO antibiotics and started on midodrine as well Discharged 06/02/20.  He comes today to be seen for Dr. Curt Bears, last seen by him 09/03/21.  He had a recent hospitalization for C-Diff, no cardiac issues, remained on midodrine for his orthostatic hypotension, no recurrent syncope. No changes were made.      09/25/21, ILR noted Aflutter w/CVR and planned to come in to discuss [place].  I saw him Nov 2022 He is accompanied by his wife. He has known baseline dementia and defers to his wife for answers.  She mentions he sleep poorly and tends to wander during the night He though denies any kind oc CP or SOB Today's BP is described as great for him He has not had recurrent syncope He will sometimes stumble because he likes his shoes tied loose, though does not fall.  His wife tries to keep them tied tight, No hx of bleeding or hematology history New onset AFlutter, started on a/c, he was rate controlled and unaware, did not want to pursue procedure, planned for a 1 mo f/u and DCCV once a/c appropriately.  (No f/u done)  Hospitalized 03/16/22 - 03/18/22 with c/o weakness, N/V/D, fever, found w/COVID  Hospitalized 04/16/22 - 04/23/22 after a syncopal event (x2), suspect orthostatic as major contributor, cardiology participated in his care, loop was interrogated with no HR/rhythm findings to explain  syncope. He was treated with fluids, and midodrine increased. His Eliquis stopped with concerns of recurrent falls/syncope, traumatic bleeding, this to be re-addressed by primary cardiology team at follow up  He was reported to have HRs to 30's by pulse ox,BP cuff, felt to be 2/2 PVCs  I saw him 04/25/22 He comes today accompanied by hs wife Ambulated in. His dementia is advancing, participates little in conversation, answers yes to everything, some yes answers are more enthusiastic though. He appropriately tells me his wife is with him. He has not had any falls or faints since home. His constipation is better back on his home bowel regime He eats very well but does not drink much water at all, his wife does well trying alternatives like watermelon, grapes, and flavored water. She checks his BP prior to getting out of bed and waits until his BP is good enough to let his sit, then stand slowly. She does not think he will wear thigh sleeves, but would perhaps do  an abdominal binder.  Loop without brady/pause episodes, discussed management strategies for orthostatic hypotension New Aflutter burden 1.4%, some false, d/w wife, given syncope, falls, not started on a/c.  He saw E. Dick, NP 04/30/22, no recurrent syncope, wife felt he was doing better. Again in d/w wife, no a/c going forward, Eliquis stopped Planned for an annual cardiology visit   Hospitalized 05/16/22 - 05/17/22 with observed bradycardia and low BPs. ? No symptoms.  Observed to have PVCs and this felt to  be the cause of observed brady rates at home, florinef added to his regime. Cardiology consulted, no particular recommendations, agreed with florinef.  I saw him 06/06/22 He is with his wife He is pleasant and cooperative, advanced dementia He has not had recurrent syncope BP generally better, occasionally his BP gets low and she has him wait in bed until after his midodrine is in. He is very good about keeping the abdominal  binder on when up Loves salty snacks but does not drink much if any water despite his wifes urging/attempts to get him to drink.  We revisited AOC and his orthostatics/falls/syncope, in d/w his wife, maintained OFF a/c. Discussed dilemma in treating PVCs and BP limitations His wife was doing a great job, trying to encourage oral hydration, he was wearing abd binder.  Device reached RRT, seems she got a call that no further monitoring would happen.  Seems perhaps she understood that we ere no longer goin to see him at all.  Palliative is on board.  TODAY He is accompanied by his wife today Since being started on Seroquel and increased to full tab daily, his BP has imroved quite a bit. This particular regime seems to be doing pretty well His dementia not particularly improved by it but BP has steadied out. His wife gets HRs at times in the 30's but recalled that the extra beats can make that number low, usually a recheck after meds and 40mnutes or so is better. He had not had any lethargy, unusual AMS, he has not had any falls or faints since our last visit. With the Seroquel and improved BP he has not had to wear the abdominal binder.  She has an alarm o his bed now and their hoe regime is getting smoother. He goes to adult day care 3d/week, gives her a couple hours these day to get stuff done. We discussed importance of her taking care of herself  as well.  He denies any symptoms She concurs, no noted cardiac issues/concerns No SOB  Device information MDT LNQII  Implanted 09/30/2018 for syncope  Past Medical History:  Diagnosis Date   Basal cell carcinoma 08/31/2020   Mid forehead - ED&C    Basal cell carcinoma of right nasal sidewall 12/15/2014   Bilateral hearing loss 08/03/2018   S/p audiology evaluation, has new hearing aides.   Bradycardia    a. 08/2018 ETT: Poor ex tolerance (2:10); b. 09/2018 s/p MDT Linq.   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 09/2018 s/p MDT Linq; b. 10/2021 Echo: EF 55-60%, no rwma, nl RV fxn, mild MR, triv AI.   Thrombocytopenia ([place]) 02/21/2011   periph smear stable 02/2020   Vitamin B12 deficiency 08/03/2018   Completed b12 shots 09/2018, maintaining levels on oral replacement (dissolvable tablets) 01/2019   Vitamin D deficiency 05/19/2018    Past Surgical History:  Procedure Laterality Date   BASAL CELL CARCINOMA EXCISION Right 01/2015   with reconstructive surgery around eye   CATARACT EXTRACTION Left 03/2017   ETT  09/2018   negative for ischemia, extremely poor exercise tolerance ([place])   FOOT SURGERY Left 1998   metatarsal amputation after injury   KNEE ARTHROSCOPY  1993   Right   KNEE ARTHROSCOPY  01/2001   Left   LOOP RECORDER INSERTION N/A 09/30/2018   Procedure: LOOP RECORDER INSERTION;   Surgeon: Constance Haw, MD;  Location: [facility];  Service: Cardiovascular;  Laterality: N/A;    Current Outpatient Medications  Medication Sig Dispense Refill   Cholecalciferol (VITAMIN D3) 1000 units CAPS Take 1 capsule (1,000 Units total) by mouth daily. 30 capsule    fexofenadine (ALLEGRA ALLERGY) 180 MG tablet Take 1 tablet (180 mg total) by mouth daily.     finasteride (PROSCAR) 5 MG tablet Take 1 tablet (5 mg total) by mouth daily. 90 tablet 3   fludrocortisone (FLORINEF) 0.1 MG tablet Take 1 tablet (0.1 mg total) by mouth daily. 90 tablet 3   FLUoxetine (PROZAC) 20 MG capsule Take 1 capsule (20 mg total) by mouth daily. 30 capsule 6   guaiFENesin-dextromethorphan (ROBITUSSIN DM) 100-10 MG/5ML syrup Take 10 mLs by mouth every 4 (four) hours as needed for cough. 118 mL 0   midodrine (PROAMATINE) 10 MG tablet Take 1 tablet (10 mg total) by mouth 3 (three) times daily before meals. 270 tablet 3   Polyethylene Glycol 3350 (MIRALAX PO) Take 17 g by mouth daily.     Probiotic Product (ALIGN PO) Take by mouth daily.     QUEtiapine (SEROQUEL) 25 MG tablet Take 0.5-1 tablets (12.5-25 mg total) by mouth at bedtime as needed (agitation, insomnia). 30 tablet 6   Sennosides (SENOKOT PO) Take 1 tablet by mouth at bedtime.     vitamin B-12 (V-R VITAMIN B-12) 500 MCG tablet Take 1 tablet (500 mcg total) by mouth daily. (Patient taking differently: Take 500 mcg by mouth at bedtime.)     No current facility-administered medications for this visit.    Allergies:   Doxepin and Lincomycin hcl   Social History:  The patient  reports that he quit smoking about 61 years ago. His smoking use included cigarettes. He has never used smokeless tobacco. He reports that he does not drink alcohol and does not use drugs.   Family History:  The patient's family history includes Cataracts in his sister; Congestive Heart Failure in his father and mother; Leukemia in his brother; Stroke (age of onset: 63)  in his father.  ROS:  Please see the history of present illness. All other systems are reviewed and otherwise negative.   PHYSICAL EXAM:  VS:  There were no vitals taken for this visit. BMI: There is no height or weight on file to calculate BMI. Well nourished, well developed, in no acute distress  HEENT: normocephalic, atraumatic  Neck: no JVD, carotid bruits  or masses Cardiac: RRR, a few extrasystoles with an prolonged ausculation, no significant murmurs, no rubs, or gallops Lungs:   CTA b/l, no wheezing, rhonchi or rales  Abd: soft, nontender MS: no deformity, age appropriate, perhaps advanced atrophy Ext:  no edema  Skin: warm and dry, no rash Neuro:  known advanced dementia,  No gross focal motor deficits appreciated Psych: euthymic mood, full affect  ILR site is stable, no tethering or discomfort   EKG:  not done today  ILR interrogation done today and reviewed by myself: Known to be RRT, not interrogated  11/08/2021: TTE  1. Left ventricular ejection fraction, by estimation, is 55 to 60%. Left  ventricular ejection fraction by 2D MOD biplane is 59.7 %. The left  ventricle has normal function. The left ventricle has no regional wall  motion abnormalities. Left ventricular  diastolic parameters were normal.   2. Right ventricular systolic function is normal. The right ventricular  size is normal.   3. The mitral valve is normal in structure. Mild mitral valve  regurgitation.   4. The aortic valve was not well visualized. Aortic valve regurgitation  is trivial.   5. The inferior vena cava is normal in size with greater than 50%  respiratory variability, suggesting right atrial pressure of 3 mmHg.   09/04/2018: ETT Blood pressure demonstrated a normal response to exercise. Clinically and electrically negative for ischemia Extremelly poor exercise tolerance Dr. Curt Bears commented:  Poor exercise tolerance.  No evidence of chronotropic incompetence.  Will need Linq  monitoring for episode of syncope.   14 day monitor Aug 2019  Indication, syncope   Baseline rhythm, sinus minimum maximum and average rate 44 136 and 64 bpm   Supraventricular ectopy, less than 1% there were frequent 297 runs of APCs longest lasting 11.2 seconds with a rate of 122 bpm the fastest 5 complexes a rate of 169 bpm.  There are no episodes of atrial fibrillation or flutter.   Ventricular ectopy, rare isolated APCs 5 couplets and one triplet less than 1% burden. There were no episodes of sinus node or AV nodal block There are no symptomatic events Conclusion brief runs of atrial premature contractions   Recent Labs: 03/17/2022: B Natriuretic Peptide 296.8 03/18/2022: TSH 0.753 04/22/2022: Magnesium 2.0 05/17/2022: ALT 15; BUN 17; Creatinine, Ser 1.27; Hemoglobin 13.9; Platelets 80; Potassium 4.1; Sodium 141  08/20/2021: Direct LDL 140.0 04/22/2022: Cholesterol 168; HDL 30; LDL Cholesterol 109; Total CHOL/HDL Ratio 5.6; Triglycerides 146; VLDL 29   CrCl cannot be calculated (Patient's most recent lab result is older than the maximum 21 days allowed.).   Wt Readings from Last 3 Encounters:  07/02/22 182 lb 2 oz (82.6 kg)  06/06/22 183 lb 9.6 oz (83.3 kg)  05/17/22 180 lb 5.4 oz (81.8 kg)     Other studies reviewed: Additional studies/records reviewed today include: summarized above  ASSESSMENT AND PLAN:  1. Syncope     Baseline conduction system disease, RBBB, LAD     Device is RRT      By prior device interrogations,  No true brady or pause episodes     Orthostatic hypotension has been demonstrated, now on midodrine and florinef     The addition of Seroquel to his regime has added his BP has had further improvement  Re-discussed treatment strategies at length His wife is doing a great job Hard with his advancing dementia. Will continue to try and encourage hydration   We have previously discussed dilemma between treating PVC and  his low BP's/orthostatic  hypotension I think the orthostatic issue is the bigger problem Try to hold off on meds for the PVCs  Sees his PMD soon for annual physical and labs   2. Aflutter CHA2DS2Vasc is 2 We have previously discussed stroke risk with Afib and fall/traumatic bleeding risk Revisited this today, His wife remains most comfortable as well staying off eliquis for now    Disposition: will have him back in 24mo sooner if needed     Current medicines are reviewed at length with the patient today.  The patient did not have any concerns regarding medicines.  SVenetia Night PA-C 07/14/2022 5:57 PM     [place] [phone](office)  [phone](fax)

## 2022-07-16 ENCOUNTER — Telehealth: Payer: Self-pay

## 2022-07-16 NOTE — Telephone Encounter (Signed)
Outreach made to Pt's wife.  Apologized that the in office appointment for her husband had been cancelled in error.  She indicates understanding.    Confirmed upcoming appointment.

## 2022-07-18 ENCOUNTER — Encounter: Payer: Self-pay | Admitting: Physician Assistant

## 2022-07-18 ENCOUNTER — Ambulatory Visit: Payer: Medicare Other | Attending: Physician Assistant | Admitting: Physician Assistant

## 2022-07-18 VITALS — BP 126/60 | HR 54 | Ht 73.0 in | Wt 182.0 lb

## 2022-07-18 DIAGNOSIS — I493 Ventricular premature depolarization: Secondary | ICD-10-CM

## 2022-07-18 DIAGNOSIS — I4892 Unspecified atrial flutter: Secondary | ICD-10-CM

## 2022-07-18 DIAGNOSIS — I951 Orthostatic hypotension: Secondary | ICD-10-CM

## 2022-07-18 NOTE — Patient Instructions (Signed)
Medication Instructions:   Your physician recommends that you continue on your current medications as directed. Please refer to the Current Medication list given to you today.   *If you need a refill on your cardiac medications before your next appointment, please call your pharmacy*   Lab Work: [place]    If you have labs (blood work) drawn today and your tests are completely normal, you will receive your results only by: [place] (if you have MyChart) OR A paper copy in the mail If you have any lab test that is abnormal or we need to change your treatment, we will call you to review the results.   Testing/Procedures: NONE ORDERED  TODAY     Follow-Up: At [facility], you and your health needs are our priority.  As part of our continuing mission to provide you with exceptional heart care, we have created designated Provider Care Teams.  These Care Teams include your primary Cardiologist (physician) and Advanced Practice Providers (APPs -  Physician Assistants and Nurse Practitioners) who all work together to provide you with the care you need, when you need it.  We recommend signing up for the patient portal called "MyChart".  Sign up information is provided on this After Visit Summary.  MyChart is used to connect with patients for Virtual Visits (Telemedicine).  Patients are able to view lab/test results, encounter notes, upcoming appointments, etc.  Non-urgent messages can be sent to your provider as well.   To learn more about what you can do with MyChart, go to [email or web address].    Your next appointment:   3 month(s)  The format for your next appointment:   In Person  Provider:   You may see Will [name], MD or one of the following Advanced Practice Providers on your designated Care Team:   [name], [place]   Other Instructions   Important Information About Sugar

## 2022-07-21 ENCOUNTER — Encounter: Payer: Self-pay | Admitting: Diagnostic Neuroimaging

## 2022-08-11 ENCOUNTER — Encounter: Payer: Self-pay | Admitting: Diagnostic Neuroimaging

## 2022-08-19 ENCOUNTER — Ambulatory Visit: Payer: Medicare Other

## 2022-08-20 ENCOUNTER — Encounter: Payer: Self-pay | Admitting: Family Medicine

## 2022-08-20 ENCOUNTER — Ambulatory Visit (INDEPENDENT_AMBULATORY_CARE_PROVIDER_SITE_OTHER): Payer: Medicare Other | Admitting: Family Medicine

## 2022-08-20 VITALS — BP 137/76 | Ht 73.0 in | Wt 187.0 lb

## 2022-08-20 DIAGNOSIS — Z9189 Other specified personal risk factors, not elsewhere classified: Secondary | ICD-10-CM

## 2022-08-20 DIAGNOSIS — Z0000 Encounter for general adult medical examination without abnormal findings: Secondary | ICD-10-CM | POA: Diagnosis not present

## 2022-08-20 NOTE — Patient Instructions (Signed)
CHECKLIST FOR A HEALTHY LIFE:  -Eat a healthy whole foods based diet, get regular physical activity, manage stress and engage in social connections - see below for specific suggestions and more information.  -Vaccines due:  -covid yearly dose  -yearly flu shot - can do with primary care office  -pneumonia vaccine (check to see if down with health departement) - if not can do with primary care  -shingles vaccine (check to see if done with health department) - if not can do with primary care or pharmacy  -tetanus or Tdap (check to see if done with health department) - if not can do with primary care  -Referrals:  -none  -Get your eyes checked per your eye specialist's recommendations  -Other issues addressed today: -healthy whole food plant heavy diet (see below for details) -gradually start adding in more gentle physical activity if able - even chair exercises can be beneficial if he is able  -Follow up: -yearly for annual wellness visit with primary care office -see primary care as planned tomorrow - ask about updating your vaccines, bring copy of list of vaccines he had at the health department if you are able    [place]: -eat real food: lots of colorful vegetables (half the plate) -consume on a regular basis: whole grains (make sure first ingredient on label contains the word "whole"), fresh fruits, fish, nuts, seeds, healthy oils (such as olive oil, avocado oil or organic grass fed unsalted butter) -may eat small amounts of dairy and lean meat on occasion, but avoid processed meats such as ham, bacon, lunch meat, etc. -drink water -try to avoid fast food and pre-packaged foods, processed meat -try to  avoid foods that contain any ingredients with names you do not recognize  -try to avoid sugar/sweets (except for the natural sugar that occurs in fresh fruit) -try to avoid sweet drinks -try to avoid white rice, white bread, pasta (unless whole grain), white or yellow potatoes  MOVE - the key to keeping your body moving and working best: -gradually increase intentional physical activity -move and stretch your body, legs, feet and arms when sitting for long periods -YouTube has lots of exercise videos for different ages and abilities as well

## 2022-08-20 NOTE — Progress Notes (Signed)
PATIENT CHECK-IN and HEALTH RISK ASSESSMENT QUESTIONNAIRE:  -completed by phone/video for upcoming Medicare Preventive Visit  Pre-Visit Check-in: 1)Vitals (height, wt, BP, etc) - record in vitals section for visit on day of visit 2)Review and Update Medications, Allergies PMH, Surgeries, Social history in Epic 3)Hospitalizations in the last year with date/reason? Yes, Jan to July for covid and for a few days in July - issues with hypotension from a blood pressure medication 4)Review and Update Care Team (patient's specialists) in Epic 5) Complete PHQ9 in Epic  6) Complete Fall Screening in Epic 7)Review all Health Maintenance Due and order under PCP if not done.  Medicare Wellness Patient Questionnaire:  Answer theses question about your habits: Do you drink alcohol? none How many drinks do you have a day? N/a Have you ever smoked? Smoked remotely, over 33 years ago Have you stopped smoking and date if applicable? Over 33 years ago  How many packs a day do you smoke? N/a Do you use smokeless tobacco?n/a Do you use an illicit drugs? N/a Do you exercises? Able to ambulate, but no exercise, in a dementia program 3 days per week - sometimes some bowling, some cycling. He is not steady on his feet. He did physical therapy for sometime at home.  Are you sexually active? None Number of partners? 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 No, wife dose most household chores Do you find it hard to follow a conversation in a noisy room? Some. Has hearing aides and see audiologist Do you often misplace items? Has  dementia  Do you balance your checkbook and or bank acounts? Wife dose this Do you feel safe at home? Feels safe Last dentist visit? Full dentures Do you need assistance with any of the following:  Driving?yes, wife dose  Feeding yourself?no  Getting from bed to chair?no  Getting to the toilet?no  Bathing or showering?wife assists  Dressing yourself?no  Managing money?yes, wife does this  Climbing a flight of stairs? Yes, needs assistance - does not have to climb stairs  Preparing meals?yes, wife does this  Do you have Advanced Directives in place (Living Will, Healthcare Power or [place])? Done per wife   Last eye Exam and location? Goes yearly, [facility]   Do you currently use prescribed or non-prescribed narcotic or opioid pain medications? none   Nurse/Assistant Credentials/time stamp:   ----------------------------------------------------------------------------------------------------------------------------------------------------------------------------------------------------------------------    Dylan [name] (Welcome to [employer], initial annual wellness or annual wellness exam)  Virtual Visit via Video Note  I connected with Dylan [name] and his wife Dylan [name]  on 08/20/22 by a video enabled telemedicine application and verified that I am speaking with the correct person using two identifiers.  Location patient: home Location provider:work or home office Persons participating in the virtual visit: patient, provider  Concerns and/or follow up today: none   See HM section in Epic for other details of completed HM.    ROS: negative for report of fevers, unintentional weight loss, vision changes, vision loss, hearing loss or change, chest pain, sob, hemoptysis, melena, hematochezia, hematuria, genital discharge or lesions, falls, bleeding or bruising, loc, thoughts  of suicide or self harm, memory loss  Patient-completed  extensive health risk assessment - reviewed and discussed with the patient: See Health Risk Assessment completed with patient prior to the visit either above or in recent phone note. This was reviewed in detailed with the patient today and appropriate recommendations, orders and referrals were placed as needed per Summary below and patient instructions.   Review of Medical History: -PMH, [place], Family History and current specialty and care providers reviewed and updated and listed below   Patient Care Team: [name], MD as PCP - General (Family Medicine) Constance Haw, MD as PCP - Electrophysiology (Cardiology) Lorretta Harp, MD as PCP - Cardiology (Cardiology) [name], [facility] as Pharmacist (Pharmacist)   Past Medical History:  Diagnosis Date   Basal cell carcinoma 08/31/2020   Mid forehead - ED&C    Basal cell carcinoma of right nasal sidewall 12/15/2014   Bilateral hearing loss 08/03/2018   S/p audiology evaluation, has new hearing aides.   Bradycardia    a. 08/2018 ETT: Poor ex tolerance (2:10); b. 09/2018 s/p MDT Linq.   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 09/2018 s/p MDT Linq; b. 10/2021 Echo: EF 55-60%, no rwma, nl RV fxn, mild MR, triv AI.   Thrombocytopenia ([place]) 02/21/2011   periph smear stable 02/2020   Vitamin B12 deficiency 08/03/2018   Completed b12 shots 09/2018, maintaining levels on oral replacement (dissolvable tablets) 01/2019   Vitamin D deficiency 05/19/2018    Past Surgical History:  Procedure Laterality Date   BASAL CELL CARCINOMA EXCISION Right 01/2015   with reconstructive surgery around eye   CATARACT EXTRACTION Left 03/2017   ETT  09/2018   negative for ischemia, extremely poor exercise tolerance ([place])   FOOT SURGERY Left 1998   metatarsal amputation after injury   KNEE ARTHROSCOPY  1993   Right   KNEE ARTHROSCOPY  01/2001   Left   LOOP RECORDER INSERTION N/A 09/30/2018   Procedure: LOOP RECORDER INSERTION;  Surgeon: Constance Haw, MD;  Location: [facility];  Service: Cardiovascular;  Laterality: N/A;    Social History   Socioeconomic History   Marital status: Married    Spouse name: Not on file   Number of children: 3   Years of education: Not on file   Highest education  level: Not on file  Occupational History   Occupation: Retired    Comment: 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, minerals, Glass blower/designer  Tobacco Use   Smoking status: Former    Types: Cigarettes    Quit date: 11/11/1960    Years since quitting: 61.8   Smokeless tobacco: Never  Vaping Use   Vaping Use: Never used  Substance and Sexual Activity   Alcohol use: No   Drug use: No   Sexual activity: Not on file  Other Topics Concern   Not on file  Social History Narrative   Lives with wife  [place])   One stepson   Occ: retired, Furniture conservator/restorer, 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, [employer]   Edu: HS   Social Determinants of Radio broadcast assistant Strain: [place]  (10/15/2021)   Overall Financial Resource Strain (CARDIA)    Difficulty of Paying Living Expenses: Not hard at all  Food Insecurity: Not on file  Transportation Needs: Not on file  Physical Activity: Not on file  Stress: Not on file  Social Connections: Not on file  Intimate Partner Violence: Not on file    Family History  Problem Relation Age of Onset   Congestive Heart Failure Mother    Stroke Father 40   Congestive Heart Failure Father    Cataracts Sister    Leukemia Brother     Current Outpatient Medications on File Prior to Visit  Medication Sig Dispense Refill   Cholecalciferol (VITAMIN D3) 1000 units CAPS Take 1 capsule (1,000 Units total) by mouth daily. 30 capsule    doxylamine, Sleep, (UNISOM) 25 MG tablet Take 25 mg by mouth at bedtime as needed.     fexofenadine (ALLEGRA ALLERGY) 180 MG tablet Take 1 tablet (180 mg total) by mouth daily.     finasteride (PROSCAR) 5 MG tablet Take 1 tablet (5 mg total) by mouth daily. 90 tablet 3   fludrocortisone (FLORINEF) 0.1 MG tablet Take 1 tablet (0.1 mg total) by mouth daily. 90 tablet 3   FLUoxetine (PROZAC) 20 MG capsule Take 1 capsule (20 mg total) by mouth daily. 30 capsule 6   guaiFENesin-dextromethorphan (ROBITUSSIN DM) 100-10 MG/5ML syrup Take 10 mLs by mouth every 4 (four) hours as needed for cough. 118 mL 0   midodrine (PROAMATINE) 10 MG tablet Take 1 tablet (10 mg total) by mouth 3 (three) times daily before meals. 270 tablet 3   Polyethylene Glycol 3350 (MIRALAX PO) Take 17 g by mouth daily.     Probiotic Product (ALIGN PO) Take by mouth daily.     Sennosides (SENOKOT PO) Take 1 tablet by mouth at bedtime.     vitamin B-12 (V-R VITAMIN B-12) 500 MCG tablet Take 1 tablet (500 mcg total) by mouth daily. (Patient taking differently: Take 500 mcg  by mouth at bedtime.)     No current facility-administered medications on file prior to visit.    Allergies  Allergen Reactions   Doxepin Other (See Comments)    Lethargy, overly sedating at low dose   Lincomycin Hcl Rash       Physical Exam Vitals:   08/20/22 1120  BP: 137/76   Estimated body mass index is 24.67 kg/m as calculated from the following:   Height as of this encounter: '6\' 1"'$  (1.854 m).   Weight as of this encounter: 187 lb (84.8 kg). BP 137/76  EKG (optional): deferred due to virtual visit  GENERAL:  patient sounds alert and in no acute distress  LUNGS: no audible sounds of distress  PSYCH/NEURO: patient sounds alert, answers some questions - pleasant and cooperative at time of phone visit, wife give most of history  Flowsheet Row Office Visit from 08/20/2022 in [place] at [place]  PHQ-9 Total Score 0           08/20/2022   11:17 AM 08/20/2021   10:27 AM 08/16/2020   12:28 PM 08/11/2019   11:38 AM 08/02/2018    1:36 PM  Depression screen PHQ 2/9  Decreased Interest 0 0 0 0 0  Down, Depressed, Hopeless 0 0 0 0 0  PHQ - 2 Score 0 0 0 0 0  Altered sleeping 0    0  Tired, decreased energy 0    0  Change in appetite 0    0  Feeling bad or failure about yourself  0    0  Trouble concentrating 0    0  Moving slowly or fidgety/restless 0    0  Suicidal thoughts 0    0  PHQ-9 Score 0    0  Difficult doing work/chores     Not difficult at all      04/23/2022    8:00 AM 04/23/2022   11:00 AM 05/16/2022   12:55 PM 05/17/2022    3:00 AM 08/20/2022   11:17 AM  Fall Risk  Falls in the past year?     0  Was there an injury with Fall?     0  Fall Risk Category Calculator     0  Fall Risk Category     Low  Patient Fall Risk Level High fall risk High fall risk High fall risk High fall risk Low fall risk  Patient at Risk for Falls Due to     No Fall Risks  Fall risk Follow up     Falls evaluation completed      SUMMARY AND PLAN:  Medicare  annual wellness visit, subsequent  Sedentary lifestyle   The following health maintenance/preventive care measures were recommended/discussed. The patient was referred if needed and if the patient was agreeable.  Vaccines: -reviewed multiple due and recommendations, covid, flu, pneumonia, shingles, tetanus -wife reports had several through the health department prior to enrolling in dementia daycare - she agrees to obtain dates/names of vaccines to take to PCP visit tomorrow. Plans to get some at that visit if not done. Discussed options for getting the latest covid vaccine.     Education and counseling on the following was provided based on the above review of health and a plan/checklist for the patient, along with additional information discussed, was provided for the patient in the patient instructions :  -Advised on importance of and resources for completing advanced directives - aready done -Provided counseling and plan for difficulty hearing - he has hearing aides and sees audiologist, gross hearing seems ok during our interaction. -Provided counseling and plan for function difficulties/ difficulties with ADLs if applicable per above screening. -Advised and counseled on maintaining healthy weight and healthy lifestyle - including the importance of a health diet, regular physical activity, social connections and stress management. He is doing dementia daycare. Diet fairly good. Still good appetite per wife. Inadequate physical activity. Offered PT - he already did PT and they gave him things to do at home, the dementia makes this difficult for the wife to get him to do. Get social interactions. Mood good for the most part per wife. Encouraged staying as active as possible, possible  chair exercises and inquiring at adult daycare if some activities are more physically active than others. Advised fall precautions if up and around and discussing with cardiologist if any symptoms with physical  activity before proceeding.  -Advised dental visits at minimum and regular eye exams   Follow up: see patient instructions   Patient Instructions  CHECKLIST FOR A HEALTHY LIFE:  -Eat a healthy whole foods based diet, get regular physical activity, manage stress and engage in social connections - see below for specific suggestions and more information.  -Vaccines due:  -covid yearly dose  -yearly flu shot - can do with primary care office  -pneumonia vaccine (check to see if down with health departement) - if not can do with primary care  -shingles vaccine (check to see if done with health department) - if not can do with primary care or pharmacy  -tetanus or Tdap (check to see if done with health department) - if not can do with primary care  -Referrals:  -none  -Get your eyes checked per your eye specialist's recommendations  -Other issues addressed today: -healthy whole food plant heavy diet (see below for details) -gradually start adding in more gentle physical activity if able - even chair exercises can be beneficial if he is able  -Follow up: -yearly for annual wellness visit with primary care office -see primary care as planned tomorrow - ask about updating your vaccines, bring copy of list of vaccines he had at the health department if you are able    [place]: -eat real food: lots of colorful vegetables (half the plate) -consume on a regular basis: whole grains (make sure first ingredient on label contains the word "whole"), fresh fruits, fish, nuts, seeds, healthy oils (such as olive oil, avocado oil or organic grass fed unsalted butter) -may eat small amounts of dairy and lean meat on  occasion, but avoid processed meats such as ham, bacon, lunch meat, etc. -drink water -try to avoid fast food and pre-packaged foods, processed meat -try to avoid foods that contain any ingredients with names you do not recognize  -try to avoid sugar/sweets (except for the natural sugar that occurs in fresh fruit) -try to avoid sweet drinks -try to avoid white rice, white bread, pasta (unless whole grain), white or yellow potatoes  MOVE - the key to keeping your body moving and working best: -gradually increase intentional physical activity -move and stretch your body, legs, feet and arms when sitting for long periods -YouTube has lots of exercise videos for different ages and abilities as well            Lucretia Kern, DO

## 2022-08-21 ENCOUNTER — Encounter: Payer: Self-pay | Admitting: Family Medicine

## 2022-08-21 ENCOUNTER — Ambulatory Visit (INDEPENDENT_AMBULATORY_CARE_PROVIDER_SITE_OTHER): Payer: Medicare Other | Admitting: Family Medicine

## 2022-08-21 VITALS — BP 138/84 | HR 57 | Temp 96.6°F | Ht 72.0 in | Wt 183.0 lb

## 2022-08-21 DIAGNOSIS — E785 Hyperlipidemia, unspecified: Secondary | ICD-10-CM | POA: Diagnosis not present

## 2022-08-21 DIAGNOSIS — N1831 Chronic kidney disease, stage 3a: Secondary | ICD-10-CM

## 2022-08-21 DIAGNOSIS — Z7189 Other specified counseling: Secondary | ICD-10-CM | POA: Diagnosis not present

## 2022-08-21 DIAGNOSIS — Z0000 Encounter for general adult medical examination without abnormal findings: Secondary | ICD-10-CM

## 2022-08-21 DIAGNOSIS — E538 Deficiency of other specified B group vitamins: Secondary | ICD-10-CM

## 2022-08-21 DIAGNOSIS — E559 Vitamin D deficiency, unspecified: Secondary | ICD-10-CM | POA: Diagnosis not present

## 2022-08-21 DIAGNOSIS — I951 Orthostatic hypotension: Secondary | ICD-10-CM

## 2022-08-21 DIAGNOSIS — Z23 Encounter for immunization: Secondary | ICD-10-CM

## 2022-08-21 DIAGNOSIS — I452 Bifascicular block: Secondary | ICD-10-CM

## 2022-08-21 DIAGNOSIS — I4892 Unspecified atrial flutter: Secondary | ICD-10-CM

## 2022-08-21 DIAGNOSIS — F03918 Unspecified dementia, unspecified severity, with other behavioral disturbance: Secondary | ICD-10-CM | POA: Diagnosis not present

## 2022-08-21 DIAGNOSIS — D696 Thrombocytopenia, unspecified: Secondary | ICD-10-CM | POA: Diagnosis not present

## 2022-08-21 DIAGNOSIS — Z66 Do not resuscitate: Secondary | ICD-10-CM

## 2022-08-21 DIAGNOSIS — I739 Peripheral vascular disease, unspecified: Secondary | ICD-10-CM

## 2022-08-21 DIAGNOSIS — F66 Other sexual disorders: Secondary | ICD-10-CM

## 2022-08-21 MED ORDER — DIPHENHYDRAMINE HCL (SLEEP) 25 MG PO CAPS: 12.5000 mg | ORAL_CAPSULE | Freq: Every evening | ORAL | Status: DC

## 2022-08-21 NOTE — Assessment & Plan Note (Signed)
H/o low HDL on latest FLP. Will stay off medication.

## 2022-08-21 NOTE — Assessment & Plan Note (Signed)
Advanced directive - scanned into chart 12/2019. Wife [name] then Jeneen Rinks are [place]. Does not want life prolonging measures if terminal condition. Confirmed DNR.

## 2022-08-21 NOTE — Assessment & Plan Note (Addendum)
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  Now on zzzquil 12.'5mg'$  nightly with benefit.

## 2022-08-21 NOTE — Assessment & Plan Note (Signed)
Now off AC. Appreciate cards care.

## 2022-08-21 NOTE — Assessment & Plan Note (Signed)
Continue 1000 IU daily. 

## 2022-08-21 NOTE — Assessment & Plan Note (Signed)
Continues vit B12 510mg nightly.

## 2022-08-21 NOTE — Assessment & Plan Note (Signed)
Did not respond to seroquel.  Now on benadryl with benefit.

## 2022-08-21 NOTE — Assessment & Plan Note (Signed)
Appreciate cards care. 

## 2022-08-21 NOTE — Assessment & Plan Note (Signed)
Confirmed with wife

## 2022-08-21 NOTE — Assessment & Plan Note (Addendum)
Latest kidney function stable with GFR 50s (05/2022)

## 2022-08-21 NOTE — Assessment & Plan Note (Addendum)
No noted claudication symptoms. Not on aspirin or statin.

## 2022-08-21 NOTE — Assessment & Plan Note (Signed)
Preventative protocols reviewed and updated unless pt declined. Discussed healthy diet and lifestyle.  

## 2022-08-21 NOTE — Assessment & Plan Note (Addendum)
Continues midodrine and florinef.

## 2022-08-21 NOTE — Patient Instructions (Addendum)
Flu shot today  Prevnar 20 today (final pneumonia shot).  No labs today.  Good to see you today.  Return in 4 months for follow up visit.   Health Maintenance After Age 86 After age 12, you are at a higher risk for certain long-term diseases and infections as well as injuries from falls. Falls are a major cause of broken bones and head injuries in people who are older than age 56. Getting regular preventive care can help to keep you healthy and well. Preventive care includes getting regular testing and making lifestyle changes as recommended by your health care provider. Talk with your health care provider about: Which screenings and tests you should have. A screening is a test that checks for a disease when you have no symptoms. A diet and exercise plan that is right for you. What should I know about screenings and tests to prevent falls? Screening and testing are the best ways to find a health problem early. Early diagnosis and treatment give you the best chance of managing medical conditions that are common after age 60. Certain conditions and lifestyle choices may make you more likely to have a fall. Your health care provider may recommend: Regular vision checks. Poor vision and conditions such as cataracts can make you more likely to have a fall. If you wear glasses, make sure to get your prescription updated if your vision changes. Medicine review. Work with your health care provider to regularly review all of the medicines you are taking, including over-the-counter medicines. Ask your health care provider about any side effects that may make you more likely to have a fall. Tell your health care provider if any medicines that you take make you feel dizzy or sleepy. Strength and balance checks. Your health care provider may recommend certain tests to check your strength and balance while standing, walking, or changing positions. Foot health exam. Foot pain and numbness, as well as not wearing  proper footwear, can make you more likely to have a fall. Screenings, including: Osteoporosis screening. Osteoporosis is a condition that causes the bones to get weaker and break more easily. Blood pressure screening. Blood pressure changes and medicines to control blood pressure can make you feel dizzy. Depression screening. You may be more likely to have a fall if you have a fear of falling, feel depressed, or feel unable to do activities that you used to do. Alcohol use screening. Using too much alcohol can affect your balance and may make you more likely to have a fall. Follow these instructions at home: Lifestyle Do not drink alcohol if: Your health care provider tells you not to drink. If you drink alcohol: Limit how much you have to: 0-1 drink a day for women. 0-2 drinks a day for men. Know how much alcohol is in your drink. In the [place] one drink equals one 12 oz bottle of beer (355 mL), one 5 oz glass of wine (148 mL), or one 1 oz glass of hard liquor (44 mL). Do not use any products that contain nicotine or tobacco. These products include cigarettes, chewing tobacco, and vaping devices, such as e-cigarettes. If you need help quitting, ask your health care provider. Activity  Follow a regular exercise program to stay fit. This will help you maintain your balance. Ask your health care provider what types of exercise are appropriate for you. If you need a cane or walker, use it as recommended by your health care provider. Wear supportive shoes that have nonskid  soles. Safety  Remove any tripping hazards, such as rugs, cords, and clutter. Install safety equipment such as grab bars in bathrooms and safety rails on stairs. Keep rooms and walkways well-lit. General instructions Talk with your health care provider about your risks for falling. Tell your health care provider if: You fall. Be sure to tell your health care provider about all falls, even ones that seem minor. You feel  dizzy, tiredness (fatigue), or off-balance. Take over-the-counter and prescription medicines only as told by your health care provider. These include supplements. Eat a healthy diet and maintain a healthy weight. A healthy diet includes low-fat dairy products, low-fat (lean) meats, and fiber from whole grains, beans, and lots of fruits and vegetables. Stay current with your vaccines. Schedule regular health, dental, and eye exams. Summary Having a healthy lifestyle and getting preventive care can help to protect your health and wellness after age 21. Screening and testing are the best way to find a health problem early and help you avoid having a fall. Early diagnosis and treatment give you the best chance for managing medical conditions that are more common for people who are older than age 53. Falls are a major cause of broken bones and head injuries in people who are older than age 34. Take precautions to prevent a fall at home. Work with your health care provider to learn what changes you can make to improve your health and wellness and to prevent falls. This information is not intended to replace advice given to you by your health care provider. Make sure you discuss any questions you have with your health care provider. Document Revised: 03/19/2021 Document Reviewed: 03/19/2021 Elsevier Patient Education  [place].

## 2022-08-21 NOTE — Assessment & Plan Note (Addendum)
Chronic, ?ITP related - will continue to monitor. Last check plt 80 (04/2022)

## 2022-08-21 NOTE — Progress Notes (Signed)
Patient ID: Dylan [name], male    DOB: 1934/01/14, 88 y.o.   MRN: [id number]  This visit was conducted in person.  BP 138/84 (BP Location: Left Arm, Patient Position: Sitting, Cuff Size: Large)   Pulse (!) 57   Temp (!) 96.6 F (35.9 C)   Ht 6' (1.829 m)   Wt 183 lb (83 kg)   SpO2 100%   BMI 24.82 kg/m    CC: CPE Subjective:   HPI: Dylan [name] is a 88 y.o. male presenting on 08/21/2022 for Medicare Wellness (Part 2. Part 1 yesterday. Here with his wife. Wife put him on an OTC sleep aid.)   Saw Dr Maudie Mercury yesterday for medicare wellness visit. Note reviewed.   No results found.  [place] Visit from 08/20/2022 in [facility]  PHQ-2 Total Score 0          08/20/2022   11:17 AM 08/20/2021   10:26 AM 08/16/2020   12:27 PM 08/11/2019   11:38 AM 08/02/2018    1:36 PM  [place] in the past year? 0 0 1 1 Yes  Comment     lost balance  Number falls in past yr: 0  0 0 2 or more  Injury with Fall? 0  0 1 No  Risk for fall due to : No Fall Risks      Follow up Falls evaluation completed      No falls since July.   Regularly sees cardiology for cardiac conduction abnormality with baseline RBBB with syncope s/p implantable loop recorder, as well as atrial flutter. Avoiding anticoagulant due to fall risk. Continues midodrine and florinef.   Established with neurology Dr Leta Baptist for dementia with behavioral disturbance, on fluoxetine '20mg'$  daily, started on seroquel 12.'5mg'$  nightly - this did not help sexual disinhibition and increased blood pressure and agitation - now off this. No f/u planned at this time. Now on OTC zzquil 1/2 pill nightly.   Palliative care home checks on him once a quarter.   Preventative: Colon cancer screening - aged out. Denies changes in BM or blood. Prostate cancer screening - aged out.  Lung cancer screening - not eligible Flu shot - yearly [place] 11/2019, 12/2019, booster 08/2020, bivalent  08/2021 Td 2006, 2011  Pneumovax-23 08/2020, FYBOFBP-10 today Shingrix - declined  Advanced directive - scanned into chart 12/2019. Wife [name] then Jeneen Rinks are [place]. Does not want life prolonging measures if terminal condition. Confirmed DNR.  Seat belt use discussed  Sunscreen use discussed. No changing moles on skin. Sees dermatologist.  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 Interim medical history since our last visit reviewed. Allergies and medications reviewed and updated. Outpatient Medications Prior to Visit  Medication Sig Dispense Refill   Cholecalciferol (VITAMIN D3) 1000 units CAPS Take 1 capsule (1,000 Units total) by mouth daily. 30 capsule    fexofenadine (ALLEGRA ALLERGY) 180 MG tablet Take 1 tablet (180 mg total) by mouth daily.  finasteride (PROSCAR) 5 MG tablet Take 1 tablet (5 mg total) by mouth daily. 90 tablet 3   fludrocortisone (FLORINEF) 0.1 MG tablet Take 1 tablet (0.1 mg total) by mouth daily. 90 tablet 3   FLUoxetine (PROZAC) 20 MG capsule Take 1 capsule (20 mg total) by mouth daily. 30 capsule 6   guaiFENesin-dextromethorphan (ROBITUSSIN DM) 100-10 MG/5ML syrup Take 10 mLs by mouth every 4 (four) hours as needed for cough. 118 mL 0   midodrine (PROAMATINE) 10 MG tablet Take 1 tablet (10 mg total) by mouth 3 (three) times daily before meals. 270 tablet 3   Polyethylene Glycol 3350 (MIRALAX PO) Take 17 g by mouth daily.     Probiotic Product (ALIGN PO) Take by mouth daily.      Sennosides (SENOKOT PO) Take 1 tablet by mouth at bedtime.     vitamin B-12 (V-R VITAMIN B-12) 500 MCG tablet Take 1 tablet (500 mcg total) by mouth daily. (Patient taking differently: Take 500 mcg by mouth at bedtime.)     doxylamine, Sleep, (UNISOM) 25 MG tablet Take 12.5 mg by mouth at bedtime as needed.     No facility-administered medications prior to visit.     Per HPI unless specifically indicated in ROS section below Review of Systems  Constitutional:  Negative for activity change, appetite change, chills, fatigue, fever and unexpected weight change.  HENT:  Negative for hearing loss.   Eyes:  Negative for visual disturbance.  Respiratory:  Positive for cough (occ). Negative for chest tightness, shortness of breath and wheezing.   Cardiovascular:  Negative for chest pain, palpitations and leg swelling.  Gastrointestinal:  Negative for abdominal distention, abdominal pain, blood in stool, constipation, diarrhea, nausea and vomiting.  Endocrine: Positive for cold intolerance.  Genitourinary:  Negative for difficulty urinating and hematuria.  Musculoskeletal:  Negative for arthralgias, myalgias and neck pain.  Skin:  Negative for rash.  Neurological:  Negative for dizziness, seizures, syncope and headaches.  Hematological:  Negative for adenopathy. Bruises/bleeds easily.  Psychiatric/Behavioral:  Negative for dysphoric mood. The patient is nervous/anxious.     Objective:  BP 138/84 (BP Location: Left Arm, Patient Position: Sitting, Cuff Size: Large)   Pulse (!) 57   Temp (!) 96.6 F (35.9 C)   Ht 6' (1.829 m)   Wt 183 lb (83 kg)   SpO2 100%   BMI 24.82 kg/m   Wt Readings from Last 3 Encounters:  08/21/22 183 lb (83 kg)  08/20/22 187 lb (84.8 kg)  07/18/22 182 lb (82.6 kg)      Physical Exam Vitals and nursing note reviewed.  Constitutional:      General: He is not in acute distress.    Appearance: Normal appearance. He is well-developed. He is not ill-appearing.   HENT:     Head: Normocephalic and atraumatic.     Right Ear: Hearing normal.     Left Ear: Hearing normal.     Ears:     Comments: Hearing aides in place    Mouth/Throat:     Comments: Mask in place Eyes:     General: No scleral icterus.    Extraocular Movements: Extraocular movements intact.     Conjunctiva/sclera: Conjunctivae normal.     Pupils: Pupils are equal, round, and reactive to light.  Neck:     Thyroid: No thyroid mass or thyromegaly.     Vascular: No carotid bruit.  Cardiovascular:     Rate and Rhythm: Normal rate and regular rhythm.  Pulses: Normal pulses.          Radial pulses are 2+ on the right side and 2+ on the left side.     Heart sounds: Normal heart sounds. No murmur heard. Pulmonary:     Effort: Pulmonary effort is normal. No respiratory distress.     Breath sounds: Normal breath sounds. No wheezing, rhonchi or rales.  Abdominal:     General: Bowel sounds are normal. There is no distension.     Palpations: Abdomen is soft. There is no mass.     Tenderness: There is no abdominal tenderness. There is no guarding or rebound.     Hernia: No hernia is present.  Musculoskeletal:        General: Normal range of motion.     Cervical back: Normal range of motion and neck supple.     Right lower leg: No edema.     Left lower leg: No edema.  Lymphadenopathy:     Cervical: No cervical adenopathy.  Skin:    General: Skin is warm and dry.     Findings: No rash.  Neurological:     General: No focal deficit present.     Mental Status: He is alert and oriented to person, place, and time.  Psychiatric:        Mood and Affect: Mood normal.        Behavior: Behavior normal.        Thought Content: Thought content normal.        Judgment: Judgment normal.       Results for orders placed or performed in visit on 06/06/22  CUP PACEART REMOTE DEVICE CHECK  Result Value Ref Range   Date Time Interrogation Session [phone]    Pulse Generator Manufacturer  MERM    Pulse Gen Model BWG66 Reveal LINQ    Pulse Gen Serial Number [id number]    Clinic Name [facility]    Implantable Pulse Generator Type ICM/ILR    Implantable Pulse Generator Implant Date [id number]    Battery Status OK    Eval Rhythm SR at 40 bpm/bigeminal PVCs    Lab Results  Component Value Date   CREATININE 1.27 (H) 05/17/2022   BUN 17 05/17/2022   NA 141 05/17/2022   K 4.1 05/17/2022   CL 108 05/17/2022   CO2 27 05/17/2022    Lab Results  Component Value Date   WBC 5.7 05/17/2022   HGB 13.9 05/17/2022   HCT 40.8 05/17/2022   MCV 91.9 05/17/2022   PLT 80 (L) 05/17/2022   No results found for: "HGBA1C"  Lab Results  Component Value Date   TSH 0.753 03/18/2022  Last vitamin D Lab Results  Component Value Date   VD25OH 54.44 08/20/2021    Assessment & Plan:   Problem List Items Addressed This Visit     Health maintenance examination - Primary (Chronic)    Preventative protocols reviewed and updated unless pt declined. Discussed healthy diet and lifestyle.       Advanced care planning/counseling discussion (Chronic)    Advanced directive - scanned into chart 12/2019. Wife [name] then Jeneen Rinks are [place]. Does not want life prolonging measures if terminal condition. Confirmed DNR.       DNR (do not resuscitate) (Chronic)    Confirmed with wife.       Dyslipidemia    H/o low HDL on latest FLP. Will stay off medication.       Right bundle branch block with left anterior fascicular  block    Appreciate cards care.       Thrombocytopenia (HCC)    Chronic, ?ITP related - will continue to monitor. Last check plt 80 (04/2022)      Vitamin D deficiency    Continue 1000 IU daily.       Dementia with behavioral disturbance    Saw neurology - trial of seroquel for sexual disinhibition was not effective, worsened agitation.  Now on zzzquil 12.'5mg'$  nightly with benefit.       Relevant Medications   diphenhydrAMINE HCl, Sleep, 25 MG CAPS   Vitamin B12  deficiency    Continues vit B12 559mg nightly.       CKD (chronic kidney disease) stage 3, GFR 30-59 ml/min (HCC)    Latest kidney function stable with GFR 50s (05/2022)      PAD (peripheral artery disease) (HCC)    No noted claudication symptoms. Not on aspirin or statin.      Sexual disinhibition    Did not respond to seroquel.  Now on benadryl with benefit.      Orthostatic hypotension    Continues midodrine and florinef.       Paroxysmal atrial flutter (HCC)    Now off AC. Appreciate cards care.       Other Visit Diagnoses     Need for influenza vaccination       Relevant Orders   Flu Vaccine QUAD High Dose(Fluad) (Completed)   Need for vaccination against Streptococcus pneumoniae       Relevant Orders   Pneumococcal conjugate vaccine 20-valent (Prevnar 20) (Completed)        Meds ordered this encounter  Medications   diphenhydrAMINE HCl, Sleep, 25 MG CAPS    Sig: Take 12.5 mg by mouth at bedtime.   Orders Placed This Encounter  Procedures   Flu Vaccine QUAD High Dose(Fluad)   Pneumococcal conjugate vaccine 20-valent (Prevnar 20)     Patient instructions: Flu shot today  Prevnar 20 today (final pneumonia shot).  No labs today.  Good to see you today.  Return in 4 months for follow up visit.   Follow up plan: Return in about 4 months (around 12/22/2022) for follow up visit.  JRia Bush MD

## 2022-08-25 ENCOUNTER — Emergency Department
Admission: EM | Admit: 2022-08-25 | Discharge: 2022-08-25 | Disposition: A | Discharge status: Home / Self Care | Payer: Medicare Other | Attending: Emergency Medicine | Admitting: Emergency Medicine

## 2022-08-25 ENCOUNTER — Other Ambulatory Visit: Payer: Self-pay

## 2022-08-25 ENCOUNTER — Encounter: Payer: Self-pay | Admitting: Emergency Medicine

## 2022-08-25 DIAGNOSIS — F0390 Unspecified dementia without behavioral disturbance: Secondary | ICD-10-CM | POA: Diagnosis not present

## 2022-08-25 DIAGNOSIS — I509 Heart failure, unspecified: Secondary | ICD-10-CM | POA: Diagnosis not present

## 2022-08-25 DIAGNOSIS — I10 Essential (primary) hypertension: Secondary | ICD-10-CM

## 2022-08-25 DIAGNOSIS — I110 Hypertensive heart disease with heart failure: Secondary | ICD-10-CM | POA: Diagnosis not present

## 2022-08-25 LAB — CBC
HCT: 46.9 % (ref 39.0–52.0)
Hemoglobin: 15.7 g/dL (ref 13.0–17.0)
MCH: 32 pg (ref 26.0–34.0)
MCHC: 33.5 g/dL (ref 30.0–36.0)
MCV: 95.5 fL (ref 80.0–100.0)
Platelets: 124 10*3/uL — ABNORMAL LOW (ref 150–400)
RBC: 4.91 MIL/uL (ref 4.22–5.81)
RDW: 13.1 % (ref 11.5–15.5)
WBC: 7.5 10*3/uL (ref 4.0–10.5)
nRBC: 0 % (ref 0.0–0.2)

## 2022-08-25 LAB — BASIC METABOLIC PANEL
Anion gap: 7 (ref 5–15)
BUN: 19 mg/dL (ref 8–23)
CO2: 27 mmol/L (ref 22–32)
Calcium: 9 mg/dL (ref 8.9–10.3)
Chloride: 109 mmol/L (ref 98–111)
Creatinine, Ser: 1.43 mg/dL — ABNORMAL HIGH (ref 0.61–1.24)
GFR, Estimated: 48 mL/min — ABNORMAL LOW (ref >60)
Glucose, Bld: 102 mg/dL — ABNORMAL HIGH (ref 70–99)
Potassium: 4.5 mmol/L (ref 3.5–5.1)
Sodium: 143 mmol/L (ref 135–145)

## 2022-08-25 LAB — TROPONIN I (HIGH SENSITIVITY): Troponin I (High Sensitivity): 11 ng/L (ref <18)

## 2022-08-25 NOTE — ED Triage Notes (Addendum)
As per wife, pt brought in due to BP of 186/116 and patient was in a-fib. Pt denies pain or fluttering in the chest. Pt has history of a-fib. As per wife, pt took his normal BP medications this morning.  As per wife, pt did get flu shot and pneumonia shot this past week.

## 2022-08-25 NOTE — ED Provider Notes (Signed)
[facility] Provider Note    Event Date/Time   First MD Initiated Contact with Patient 08/25/22 1415     (approximate)  History   Chief Complaint: Hypertension  HPI  BOEN STERBENZ is a 88 y.o. male with a past medical history of dementia, hypertension, CKD, presents to the emergency department for elevated blood pressure.  According to the wife who cares for the patient she states the patient has dementia, she checks his blood pressure every morning and she states this morning the pulse was reading low and the blood pressure was reading very high as high as 102 systolic.  Wife became concerned, dose the patient's medication decided to recheck the blood pressure was still elevated so they came to the emergency department for evaluation.  Here the patient's blood pressure is 126/61 with a pulse rate in the 50s.  Patient has no complaints.  Wife states often times he will not complain due to his dementia.  Physical Exam   Triage Vital Signs: ED Triage Vitals  Enc Vitals Group     BP 08/25/22 1259 126/61     Pulse Rate 08/25/22 1259 (!) 51     Resp 08/25/22 1259 18     Temp 08/25/22 1259 97.7 F (36.5 C)     Temp Source 08/25/22 1259 Axillary     SpO2 08/25/22 1259 95 %     Weight 08/25/22 1302 183 lb (83 kg)     Height 08/25/22 1302 '6\' 1"'$  (1.854 m)     Head Circumference --      Peak Flow --      Pain Score 08/25/22 1301 0     Pain Loc --      Pain Edu? --      Excl. in [place]? --     Most recent vital signs: Vitals:   08/25/22 1259  BP: 126/61  Pulse: (!) 51  Resp: 18  Temp: 97.7 F (36.5 C)  SpO2: 95%    General: Awake, no distress.  CV:  Good peripheral perfusion.  1 irregular rhythm rate around 70 bpm. Resp:  Normal effort.  Equal breath sounds bilaterally.  Abd:  No distention.  Soft, nontender.  No rebound or guarding. Other:  Calm and cooperative.   ED Results / Procedures / Treatments   EKG  EKG viewed and interpreted by myself  appears to show a bifascicular block with occasional ectopic beats around 80 bpm.  Narrow QRS, left axis deviation, nonspecific ST changes.  Prior EKG also appears to show a bifascicular block although without ectopic beats.   MEDICATIONS ORDERED IN ED: Medications - No data to display   IMPRESSION / MDM / [place] / ED COURSE  I reviewed the triage vital signs and the nursing notes.  Patient's presentation is most consistent with acute presentation with potential threat to life or bodily function.  Patient presents to the emergency department for hypertension as high as 585 systolic at home.  Here the patient appears well he took his home blood pressure medications.  Blood pressure 126/61.  The wife brought their home machine, I rechecked the blood pressure with the patient's home machine currently 128/81 on their machine which correlates to our blood pressure well.  Patient has no complaints.  Wife is concerned given the patient's dementia she does not believe he would complain.  Discussed with the wife checking basic labs including a CBC chemistry and a troponin.  Wife agreeable to plan of care.  As long as labs are normal/reassuring I believe the patient could be discharged from the emergency department and follow-up with his doctor.  Patient agreeable to plan.  FINAL CLINICAL IMPRESSION(S) / ED DIAGNOSES   Hypertension    Note:  This document was prepared using Dragon voice recognition software and may include unintentional dictation errors.   Harvest Dark, MD 08/25/22 1445

## 2022-08-25 NOTE — ED Provider Notes (Signed)
Patient received in signout from Dr. Kerman Passey pending follow-up blood work after patient presented for evaluation of asymptomatic hypertension.  His blood pressures have been normal since being here.  He remains asymptomatic.  His exam is nonfocal and reassuring.  Family at bedside denies any additional concerns and feels comfortable with discharge home with close outpatient follow-up.   Merlyn Lot, MD 08/25/22 1535

## 2022-08-28 ENCOUNTER — Encounter: Payer: Self-pay | Admitting: Family Medicine

## 2022-09-03 ENCOUNTER — Telehealth: Payer: Self-pay

## 2022-09-03 NOTE — Progress Notes (Signed)
    Chronic Care Management Pharmacy Assistant   Name: Dylan [name]  MRN: [id number] DOB: Oct 18, 1934  Reason for Encounter: CCM (Hosptial Follow Up)  Medications: Outpatient Encounter Medications as of 09/03/2022  Medication Sig   Cholecalciferol (VITAMIN D3) 1000 units CAPS Take 1 capsule (1,000 Units total) by mouth daily.   diphenhydrAMINE HCl, Sleep, 25 MG CAPS Take 12.5 mg by mouth at bedtime.   fexofenadine (ALLEGRA ALLERGY) 180 MG tablet Take 1 tablet (180 mg total) by mouth daily.   finasteride (PROSCAR) 5 MG tablet Take 1 tablet (5 mg total) by mouth daily.   fludrocortisone (FLORINEF) 0.1 MG tablet Take 1 tablet (0.1 mg total) by mouth daily.   FLUoxetine (PROZAC) 20 MG capsule Take 1 capsule (20 mg total) by mouth daily.   guaiFENesin-dextromethorphan (ROBITUSSIN DM) 100-10 MG/5ML syrup Take 10 mLs by mouth every 4 (four) hours as needed for cough.   midodrine (PROAMATINE) 10 MG tablet Take 1 tablet (10 mg total) by mouth 3 (three) times daily before meals.   Polyethylene Glycol 3350 (MIRALAX PO) Take 17 g by mouth daily.   Probiotic Product (ALIGN PO) Take by mouth daily.   Sennosides (SENOKOT PO) Take 1 tablet by mouth at bedtime.   vitamin B-12 (V-R VITAMIN B-12) 500 MCG tablet Take 1 tablet (500 mcg total) by mouth daily. (Patient taking differently: Take 500 mcg by mouth at bedtime.)   No facility-administered encounter medications on file as of 09/03/2022.    Reviewed hospital notes for details of recent visit. Has patient been contacted by Transitions of Care team? No Has patient seen PCP/specialist for hospital follow up (summarize OV if yes): No  Admitted to the ED on 08/25/2022. Discharge date was 08/25/2022.  Discharged from [facility].   Discharge diagnosis (Principal Problem): Hypertension Patient was discharged to Home  Brief summary of hospital course: Patient's presentation is most consistent with acute presentation with potential threat  to life or bodily function.   Patient presents to the emergency department for hypertension as high as 846 systolic at home. Here the patient appears well he took his home blood pressure medications.  Blood pressure 126/61. The wife brought their home machine, I rechecked the blood pressure with the patient's home machine currently 128/81 on their machine which correlates to our blood pressure well.  Patient has no complaints.  Wife is concerned given the patient's dementia she does not believe he would complain.  Discussed with the wife checking basic labs including a CBC chemistry and a troponin. Wife agreeable to plan of care.  As long as labs are normal/reassuring I believe the patient could be discharged from the emergency department and follow-up with his doctor.  Patient agreeable to plan.  Medications that remain the same after Hospital Discharge:??  -All other medications will remain the same.    Next CCM appt: 10/22/2022  Other upcoming appts: No appointments scheduled within the next 30 days.  [name], CPP notified  Marijean Niemann, [place] Clinical Pharmacy Assistant [phone]    [name], PharmD notified and will determine if action is needed.

## 2022-09-05 ENCOUNTER — Ambulatory Visit: Payer: Medicare Other | Admitting: Dermatology

## 2022-09-05 DIAGNOSIS — L820 Inflamed seborrheic keratosis: Secondary | ICD-10-CM | POA: Diagnosis not present

## 2022-09-05 DIAGNOSIS — L821 Other seborrheic keratosis: Secondary | ICD-10-CM | POA: Diagnosis not present

## 2022-09-05 DIAGNOSIS — L570 Actinic keratosis: Secondary | ICD-10-CM | POA: Diagnosis not present

## 2022-09-05 DIAGNOSIS — L578 Other skin changes due to chronic exposure to nonionizing radiation: Secondary | ICD-10-CM

## 2022-09-05 DIAGNOSIS — L720 Epidermal cyst: Secondary | ICD-10-CM

## 2022-09-05 DIAGNOSIS — Z85828 Personal history of other malignant neoplasm of skin: Secondary | ICD-10-CM | POA: Diagnosis not present

## 2022-09-05 DIAGNOSIS — Z1283 Encounter for screening for malignant neoplasm of skin: Secondary | ICD-10-CM

## 2022-09-05 DIAGNOSIS — D229 Melanocytic nevi, unspecified: Secondary | ICD-10-CM | POA: Diagnosis not present

## 2022-09-05 DIAGNOSIS — L814 Other melanin hyperpigmentation: Secondary | ICD-10-CM | POA: Diagnosis not present

## 2022-09-05 NOTE — Progress Notes (Signed)
Follow-Up Visit   Subjective  Dylan [name] is a 88 y.o. male who presents for the following: Annual Exam (1 year tbse. Patient has severe dementia unable to communicate. Wife reports spot under right eye, spot at right clavicle area, right brow area, something inside left ear. Patient has history of multiple bcc. ). The patient presents for Total-Body Skin Exam (TBSE) for skin cancer screening and mole check.  The patient has spots, moles and lesions to be evaluated, some may be new or changing and the patient has concerns that these could be cancer.  The following portions of the chart were reviewed this encounter and updated as appropriate:  Tobacco  Allergies  Meds  Problems  Med Hx  Surg Hx  Fam Hx     Review of Systems: No other skin or systemic complaints except as noted in HPI or Assessment and Plan.  Objective  Well appearing patient in no apparent distress; mood and affect are within normal limits.  A full examination was performed including scalp, head, eyes, ears, nose, lips, neck, chest, axillae, abdomen, back, buttocks, bilateral upper extremities, bilateral lower extremities, hands, feet, fingers, toes, fingernails, and toenails. All findings within normal limits unless otherwise noted below.  left dorsum hand x 1 Erythematous thin papules/macules with gritty scale.   right chest x 1, (2) Erythematous stuck-on, waxy papule or plaque  Right Lower Eyelid Smooth white papule(s).    Assessment & Plan  Actinic keratosis left dorsum hand x 1 Actinic keratoses are precancerous spots that appear secondary to cumulative UV radiation exposure/sun exposure over time. They are chronic with expected duration over 1 year. A portion of actinic keratoses will progress to squamous cell carcinoma of the skin. It is not possible to reliably predict which spots will progress to skin cancer and so treatment is recommended to prevent development of skin cancer.  Recommend daily  broad spectrum sunscreen SPF 30+ to sun-exposed areas, reapply every 2 hours as needed.  Recommend staying in the shade or wearing long sleeves, sun glasses (UVA+UVB protection) and wide brim hats (4-inch brim around the entire circumference of the hat). Call for new or changing lesions.  Destruction of lesion - left dorsum hand x 1 Complexity: simple   Destruction method: cryotherapy   Informed consent: discussed and consent obtained   Timeout:  patient name, date of birth, surgical site, and procedure verified Lesion destroyed using liquid nitrogen: Yes   Region frozen until ice ball extended beyond lesion: Yes   Outcome: patient tolerated procedure well with no complications   Post-procedure details: wound care instructions given    Inflamed seborrheic keratosis (2) right chest x 1, Symptomatic, irritating, patient  would like treated. Patient had isk at left antihelix and left concha. Unable to treat today due patient not cooperating with treatment.   Destruction of lesion - right chest x 1, Complexity: simple   Destruction method: cryotherapy   Informed consent: discussed and consent obtained   Timeout:  patient name, date of birth, surgical site, and procedure verified Lesion destroyed using liquid nitrogen: Yes   Region frozen until ice ball extended beyond lesion: Yes   Outcome: patient tolerated procedure well with no complications   Post-procedure details: wound care instructions given   Additional details:  Prior to procedure, discussed risks of blister formation, small wound, skin dyspigmentation, or rare scar following cryotherapy. Recommend Vaseline ointment to treated areas while healing.  [place] Right Lower Eyelid Benign-appearing.  Recommend observation for change.  Lentigines - Scattered tan macules - Due to sun exposure - Benign-appearing, observe - Recommend daily broad spectrum sunscreen SPF 30+ to sun-exposed areas, reapply every 2 hours as needed. - Call  for any changes  Seborrheic Keratoses - Stuck-on, waxy, tan-brown papules and/or plaques  - Benign-appearing - Discussed benign etiology and prognosis. - Observe - Call for any changes  Melanocytic Nevi - Tan-brown and/or pink-flesh-colored symmetric macules and papules - Benign appearing on exam today - Observation - Call clinic for new or changing moles - Recommend daily use of broad spectrum spf 30+ sunscreen to sun-exposed areas.   Hemangiomas - Red papules - Discussed benign nature - Observe - Call for any changes  Actinic Damage - Chronic condition, secondary to cumulative UV/sun exposure - diffuse scaly erythematous macules with underlying dyspigmentation - Recommend daily broad spectrum sunscreen SPF 30+ to sun-exposed areas, reapply every 2 hours as needed.  - Staying in the shade or wearing long sleeves, sun glasses (UVA+UVB protection) and wide brim hats (4-inch brim around the entire circumference of the hat) are also recommended for sun protection.  - Call for new or changing lesions.  History of Basal Cell Carcinoma of the Skin Mid forehead ED&C 2021 Right nasal sidewall 12/2014 Mohs - No evidence of recurrence today - Recommend regular full body skin exams - Recommend daily broad spectrum sunscreen SPF 30+ to sun-exposed areas, reapply every 2 hours as needed.  - Call if any new or changing lesions are noted between office visits  Skin cancer screening performed today. Return in about 1 year (around 09/06/2023) for TBSE. IRuthell Rummage, CMA, am acting as scribe for Sarina Ser, MD. Documentation: I have reviewed the above documentation for accuracy and completeness, and I agree with the above.  Sarina Ser, MD

## 2022-09-05 NOTE — Patient Instructions (Addendum)
Recommend Urea 40 % cream apply topically to areas he scratches daily. Will help smooth areas out.    Actinic keratoses are precancerous spots that appear secondary to cumulative UV radiation exposure/sun exposure over time. They are chronic with expected duration over 1 year. A portion of actinic keratoses will progress to squamous cell carcinoma of the skin. It is not possible to reliably predict which spots will progress to skin cancer and so treatment is recommended to prevent development of skin cancer.  Recommend daily broad spectrum sunscreen SPF 30+ to sun-exposed areas, reapply every 2 hours as needed.  Recommend staying in the shade or wearing long sleeves, sun glasses (UVA+UVB protection) and wide brim hats (4-inch brim around the entire circumference of the hat). Call for new or changing lesions.    Seborrheic Keratosis  What causes seborrheic keratoses? Seborrheic keratoses are harmless, common skin growths that first appear during adult life.  As time goes by, more growths appear.  Some people may develop a large number of them.  Seborrheic keratoses appear on both covered and uncovered body parts.  They are not caused by sunlight.  The tendency to develop seborrheic keratoses can be inherited.  They vary in color from skin-colored to gray, brown, or even black.  They can be either smooth or have a rough, warty surface.   Seborrheic keratoses are superficial and look as if they were stuck on the skin.  Under the microscope this type of keratosis looks like layers upon layers of skin.  That is why at times the top layer may seem to fall off, but the rest of the growth remains and re-grows.    Treatment Seborrheic keratoses do not need to be treated, but can easily be removed in the office.  Seborrheic keratoses often cause symptoms when they rub on clothing or jewelry.  Lesions can be in the way of shaving.  If they become inflamed, they can cause itching, soreness, or burning.  Removal  of a seborrheic keratosis can be accomplished by freezing, burning, or surgery. If any spot bleeds, scabs, or grows rapidly, please return to have it checked, as these can be an indication of a skin cancer.  Cryotherapy Aftercare  Wash gently with soap and water everyday.   Apply Vaseline and Band-Aid daily until healed.       Melanoma ABCDEs  Melanoma is the most dangerous type of skin cancer, and is the leading cause of death from skin disease.  You are more likely to develop melanoma if you: Have light-colored skin, light-colored eyes, or red or blond hair Spend a lot of time in the sun Tan regularly, either outdoors or in a tanning bed Have had blistering sunburns, especially during childhood Have a close family member who has had a melanoma Have atypical moles or large birthmarks  Early detection of melanoma is key since treatment is typically straightforward and cure rates are extremely high if we catch it early.   The first sign of melanoma is often a change in a mole or a new dark spot.  The ABCDE system is a way of remembering the signs of melanoma.  A for asymmetry:  The two halves do not match. B for border:  The edges of the growth are irregular. C for color:  A mixture of colors are present instead of an even brown color. D for diameter:  Melanomas are usually (but not always) greater than 45m - the size of a pencil eraser. E for evolution:  The spot keeps changing in size, shape, and color.  Please check your skin once per month between visits. You can use a small mirror in front and a large mirror behind you to keep an eye on the back side or your body.   If you see any new or changing lesions before your next follow-up, please call to schedule a visit.  Please continue daily skin protection including broad spectrum sunscreen SPF 30+ to sun-exposed areas, reapplying every 2 hours as needed when you're outdoors.   Staying in the shade or wearing long sleeves, sun  glasses (UVA+UVB protection) and wide brim hats (4-inch brim around the entire circumference of the hat) are also recommended for sun protection.    Due to recent changes in healthcare laws, you may see results of your pathology and/or laboratory studies on MyChart before the doctors have had a chance to review them. We understand that in some cases there may be results that are confusing or concerning to you. Please understand that not all results are received at the same time and often the doctors may need to interpret multiple results in order to provide you with the best plan of care or course of treatment. Therefore, we ask that you please give [place] 2 business days to thoroughly review all your results before contacting the office for clarification. Should we see a critical lab result, you will be contacted sooner.   If You Need Anything After Your Visit  If you have any questions or concerns for your doctor, please call our main line at [phone] and press option 4 to reach your doctor's medical assistant. If no one answers, please leave a voicemail as directed and we will return your call as soon as possible. Messages left after 4 pm will be answered the following business day.   You may also send [place] a message via [place]. We typically respond to MyChart messages within 1-2 business days.  For prescription refills, please ask your pharmacy to contact our office. Our fax number is [phone].  If you have an urgent issue when the clinic is closed that cannot wait until the next business day, you can page your doctor at the number below.    Please note that while we do our best to be available for urgent issues outside of office hours, we are not available 24/7.   If you have an urgent issue and are unable to reach [place], you may choose to seek medical care at your doctor's office, retail clinic, urgent care center, or emergency room.  If you have a medical emergency, please immediately call  911 or go to the emergency department.  Pager Numbers  - Dr. Nehemiah Massed: [phone]  - Dr. Laurence Ferrari: [phone]  - Dr. Nicole Kindred: [phone]  In the event of inclement weather, please call our main line at [phone] for an update on the status of any delays or closures.  Dermatology Medication Tips: Please keep the boxes that topical medications come in in order to help keep track of the instructions about where and how to use these. Pharmacies typically print the medication instructions only on the boxes and not directly on the medication tubes.   If your medication is too expensive, please contact our office at [phone] option 4 or send [place] a message through [place].   We are unable to tell what your co-pay for medications will be in advance as this is different depending on your insurance coverage. However, we may be able to find  a substitute medication at lower cost or fill out paperwork to get insurance to cover a needed medication.   If a prior authorization is required to get your medication covered by your insurance company, please allow [place] 1-2 business days to complete this process.  Drug prices often vary depending on where the prescription is filled and some pharmacies may offer cheaper prices.  The website www.goodrx.com contains coupons for medications through different pharmacies. The prices here do not account for what the cost may be with help from insurance (it may be cheaper with your insurance), but the website can give you the price if you did not use any insurance.  - You can print the associated coupon and take it with your prescription to the pharmacy.  - You may also stop by our office during regular business hours and pick up a GoodRx coupon card.  - If you need your prescription sent electronically to a different pharmacy, notify our office through [facility] or by phone at [phone] option 4.     Si Usted Necesita Algo Despus de Su  Visita  Tambin puede enviarnos un mensaje a travs de Pharmacist, community. Por lo general respondemos a los mensajes de MyChart en el transcurso de 1 a 2 das hbiles.  Para renovar recetas, por favor pida a su farmacia que se ponga en contacto con nuestra oficina. Harland Dingwall de fax es [place] [phone].  Si tiene un asunto urgente cuando la clnica est cerrada y que no puede esperar hasta el siguiente da hbil, puede llamar/localizar a su doctor(a) al nmero que aparece a continuacin.   Por favor, tenga en cuenta que aunque hacemos todo lo posible para estar disponibles para asuntos urgentes fuera del horario de [place], no estamos disponibles las 24 horas del da, los 7 das de la [place].   Si tiene un problema urgente y no puede comunicarse con nosotros, puede optar por buscar atencin mdica  en el consultorio de su doctor(a), en una clnica privada, en un centro de atencin urgente o en una sala de emergencias.  Si tiene Engineering geologist, por favor llame inmediatamente al 911 o vaya a la sala de emergencias.  Nmeros de bper  - Dr. Nehemiah Massed: [phone]  - Dra. Moye: [phone]  - Dra. Nicole Kindred: [phone]  En caso de inclemencias del [place], por favor llame a Johnsie Kindred principal al [phone] para una actualizacin sobre el [place] de cualquier retraso o cierre.  Consejos para la medicacin en dermatologa: Por favor, guarde las cajas en las que vienen los medicamentos de uso tpico para ayudarle a seguir las instrucciones sobre dnde y cmo usarlos. Las farmacias generalmente imprimen las instrucciones del medicamento slo en las cajas y no directamente en los tubos del [place].   Si su medicamento es muy caro, por favor, pngase en contacto con Zigmund Daniel llamando al [phone] y presione la opcin 4 o envenos un mensaje a travs de Pharmacist, community.   No podemos decirle cul ser su copago por los medicamentos por adelantado ya que esto es diferente dependiendo de la  cobertura de su seguro. Sin embargo, es posible que podamos encontrar un medicamento sustituto a Electrical engineer un formulario para que el seguro cubra el medicamento que se considera necesario.   Si se requiere una autorizacin previa para que su compaa de seguros [place] su medicamento, por favor permtanos de 1 a 2 das hbiles para completar este proceso.  Los precios de los medicamentos varan con frecuencia dependiendo del  lugar de dnde se surte la receta y alguna farmacias pueden ofrecer precios ms baratos.  El sitio web www.goodrx.com tiene cupones para medicamentos de Airline pilot. Los precios aqu no tienen en cuenta lo que podra costar con la ayuda del seguro (puede ser ms barato con su seguro), pero el sitio web puede darle el precio si no utiliz Research scientist (physical sciences).  - Puede imprimir el cupn correspondiente y llevarlo con su receta a la farmacia.  - Tambin puede pasar por nuestra oficina durante el horario de atencin regular y Charity fundraiser una tarjeta de cupones de GoodRx.  - Si necesita que su receta se enve electrnicamente a una farmacia diferente, informe a nuestra oficina a travs de MyChart de [place] o por telfono llamando al [phone] y presione la opcin 4.

## 2022-09-09 ENCOUNTER — Encounter: Payer: Self-pay | Admitting: Dermatology

## 2022-09-09 ENCOUNTER — Other Ambulatory Visit: Payer: Self-pay | Admitting: Family Medicine

## 2022-09-12 ENCOUNTER — Telehealth: Payer: Self-pay

## 2022-09-12 NOTE — Progress Notes (Signed)
Chronic Care Management Pharmacy Assistant   Name: Dylan [name]  MRN: [id number] DOB: [birth year]  Reason for Encounter: CCM (Hypertension Disease State)  Recent office visits:  None since last CCM contact  Recent consult visits:  09/05/22 Sarina Ser, MD (Dermatology) Annual Exam Procedure: Cryotherapy x2    Hospital visits:  08/25/22 [facility] Follow-Up documented on 09/03/2022  Medications: Outpatient Encounter Medications as of 09/12/2022  Medication Sig   Cholecalciferol (VITAMIN D3) 1000 units CAPS Take 1 capsule (1,000 Units total) by mouth daily.   diphenhydrAMINE HCl, Sleep, 25 MG CAPS Take 12.5 mg by mouth at bedtime.   fexofenadine (ALLEGRA ALLERGY) 180 MG tablet Take 1 tablet (180 mg total) by mouth daily.   finasteride (PROSCAR) 5 MG tablet Take 1 tablet by mouth once daily   fludrocortisone (FLORINEF) 0.1 MG tablet Take 1 tablet (0.1 mg total) by mouth daily.   FLUoxetine (PROZAC) 20 MG capsule Take 1 capsule (20 mg total) by mouth daily.   guaiFENesin-dextromethorphan (ROBITUSSIN DM) 100-10 MG/5ML syrup Take 10 mLs by mouth every 4 (four) hours as needed for cough.   midodrine (PROAMATINE) 10 MG tablet Take 1 tablet (10 mg total) by mouth 3 (three) times daily before meals.   Polyethylene Glycol 3350 (MIRALAX PO) Take 17 g by mouth daily.   Probiotic Product (ALIGN PO) Take by mouth daily.   Sennosides (SENOKOT PO) Take 1 tablet by mouth at bedtime.   vitamin B-12 (V-R VITAMIN B-12) 500 MCG tablet Take 1 tablet (500 mcg total) by mouth daily. (Patient taking differently: Take 500 mcg by mouth at bedtime.)   No facility-administered encounter medications on file as of 09/12/2022.    Recent Office Vitals: BP Readings from Last 3 Encounters:  08/25/22 126/61  08/21/22 138/84  08/20/22 137/76   Pulse Readings from Last 3 Encounters:  08/25/22 (!) 51  08/21/22 (!) 57  07/18/22 (!) 54    Wt Readings from Last 3 Encounters:  08/25/22 183 lb (83 kg)   08/21/22 183 lb (83 kg)  08/20/22 187 lb (84.8 kg)     Kidney Function Lab Results  Component Value Date/Time   CREATININE 1.43 (H) 08/25/2022 02:42 PM   CREATININE 1.27 (H) 05/17/2022 06:49 AM   GFR 46.81 (L) 04/26/2022 01:12 PM   GFRNONAA 48 (L) 08/25/2022 02:42 PM   GFRAA >60 06/02/2020 04:33 AM       Latest Ref Rng & Units 08/25/2022    2:42 PM 05/17/2022    6:49 AM 05/16/2022   12:58 PM  BMP  Glucose 70 - 99 mg/dL 102  86  72   BUN 8 - 23 mg/dL '19  17  21   '$ Creatinine 0.61 - 1.24 mg/dL 1.43  1.27  1.32   Sodium 135 - 145 mmol/L 143  141  138   Potassium 3.5 - 5.1 mmol/L 4.5  4.1  4.5   Chloride 98 - 111 mmol/L 109  108  106   CO2 22 - 32 mmol/L '27  27  25   '$ Calcium 8.9 - 10.3 mg/dL 9.0  8.7  8.8    Contacted patient on 09/23/2022 to discuss hypertension disease state  Current antihypertensive regimen:  Midodrine 10 mg - 1 tablet three times daily Florinef 0.1 mg - 1 tablet daily  Patient verbally confirms he is taking the above medications as directed. Yes  How often are you checking your Blood Pressure? daily  he checks his blood pressure in the morning after taking his  medication.  Current home BP readings: Patient's wife was not home to give me readings; she stated she has been in contact with cardiology for his blood pressure.   Wrist or arm cuff: Arm cuff Caffeine intake: Coffee - 1 cup every morning; off tea and drinking Prop[place]  Salt intake: No added salt  Over the counter medications including pseudoephedrine or NSAIDs? Allegra - 1 daily and Tylenol if he has a vaccination/shot  Any readings above 180/120? No  What recent interventions/DTPs have been made by any provider to improve Blood Pressure control since last CPP Visit:  No recent interventions  Any recent hospitalizations or ED visits since last visit with CPP? Yes  What diet changes have been made to improve Blood Pressure Control?  Patient's wife Dylan [name] takes care of his meals.  What  exercise is being done to improve your Blood Pressure Control?  Patient doe not exercise  Adherence Review: Is the patient currently on ACE/ARB medication? No  Summary of recommendations from last [place] visit (Date:10/15/2021)  Summary: -Patient's wife reports compliance with new Eliquis as prescribed, denies bleeding issues. His bleeding risk is slightly higher than normal due to concomitant SSRI. Counseled on s/sx of bleeding, when to seek emergency care -Patient's wife reports improvement in "inhibitions" with higher dose of sertraline   Recommendations/Changes made from today's visit: -Advised to avoid NSAIDs -No med changes   Plan: -[place] will call patient in 3 months for BP check -Pharmacist follow up televisit scheduled for 6 months  Star Rating Drugs:  Medication:  Last Fill: Day Supply No Star Rating Drugs Noted  Care Gaps: Annual wellness visit in last year? Yes 08/20/2022 Most Recent BP reading: 126/61 on 08/25/2022  Upcoming appointments: CCM appointment on 10/22/2022  [name], CPP notified  Marijean Niemann, [place] Assistant [phone]

## 2022-09-15 ENCOUNTER — Other Ambulatory Visit: Payer: Self-pay | Admitting: Family Medicine

## 2022-09-20 ENCOUNTER — Encounter: Payer: Self-pay | Admitting: Family Medicine

## 2022-09-20 NOTE — Telephone Encounter (Signed)
Updated pt's chart.  

## 2022-09-22 ENCOUNTER — Other Ambulatory Visit: Payer: Self-pay | Admitting: Family Medicine

## 2022-09-23 ENCOUNTER — Telehealth: Payer: Self-pay | Admitting: Cardiology

## 2022-09-23 NOTE — Telephone Encounter (Signed)
Received the following MyChart message from Pt Schedule  Scheduler message: Good Morning, Could you please give [place] some more information on the patient's symptoms?   1. What are your last 5 BP and HR readings?  2. Are you having any other symptoms (ex. Dizziness, headache, blurred vision, passed out)?  3. What is your medication issue?   Patient response: "His high high blood pressure in the mornings, with low low pulse rate. His weakness and confusion that goes with it.   144/101.    Pl - 31 149/93.      Pl - 57 157/81.     Pl - 31 This morning  out of the blue it's better  146/72.      Pl - 50 Is this just his Afib?  Some mornings it takes a couple of hours to get it to regulate out . Are these to high to you or am I being paranoid?"

## 2022-09-23 NOTE — Telephone Encounter (Signed)
Patient's wife states patient's heart rate has been in the 30s frequently. Patient has dementia and does not express discomfort.  Advised that if patient demonstrates dizziness, SOB or expresses CP or chest discomfort to call EMS or go the ED for urgent evaluation. Patient scheduled with Ambrose Pancoast, NP on 09/26/2022.

## 2022-09-24 NOTE — Telephone Encounter (Signed)
Fluoxetine Last filled:  08/26/22, #30 Last OV:  08/21/22, CPE Next OV:  01/21/23, 4-6 mo f/u

## 2022-09-25 NOTE — Progress Notes (Unsigned)
Office Visit    Patient Name: [name] Date of Encounter: 09/26/2022  Primary Care Provider:  [name], MD Primary Cardiologist:  Quay Burow, MD Primary Electrophysiologist: Constance Haw, MD  Chief Complaint    Dylan [name] is a 88 y.o. male with PMH of PAD, RBBB, paroxysmal atrial flutter CKD stage III, dementia, orthostatic hypotension who presents today for bradycardia.  Past Medical History    Past Medical History:  Diagnosis Date   Basal cell carcinoma 08/31/2020   Mid forehead - ED&C    Basal cell carcinoma of right nasal sidewall 12/15/2014   Bilateral hearing loss 08/03/2018   S/p audiology evaluation, has new hearing aides.   Bradycardia    a. 08/2018 ETT: Poor ex tolerance (2:10); b. 09/2018 s/p MDT Linq.   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 09/2018 s/p MDT Linq; b. 10/2021 Echo: EF 55-60%, no rwma, nl RV fxn, mild MR, triv AI.   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   KNEE ARTHROSCOPY  1993   Right   KNEE ARTHROSCOPY  01/2001   Left   LOOP RECORDER INSERTION N/A 09/30/2018   Procedure: LOOP RECORDER INSERTION;  Surgeon: Constance Haw, MD;  Location: [facility];  Service: Cardiovascular;  Laterality: N/A;    Allergies  Allergies  Allergen Reactions   Doxepin Other (See Comments)    Lethargy, overly sedating at low dose   Seroquel [Quetiapine] Other (See Comments)    Worsened agitation, increased BP   Lincomycin Hcl Rash    History of Present Illness    [name] is a 88 year old male with the above-mentioned past medical history who presents today for posthospital follow-up of syncope.  He presented to [facility] via EMS on 04/16/2022 following 2 episodes of syncope that morning.  Patient  had mechanical fall and injured his head. CT performed in the ED was negative and C-spine was also negative for acute injury.  EKG revealed bifascicular block with ventricular bigeminy.  Dylan [name] has been followed by Dr. Curt Bears since 2022 for management of PAF and longstanding history of orthostasis.  Patient has ILR that was placed in 2019 for cryptogenic stroke that revealed no significant events but histogram show periods of bradycardia with PVCs.  His midodrine was increased to 10  mg 3 times daily and Eliquis held due to bleeding risk.  He was seen on 04/25/2022 by [name], PA who agreed with remaining off of Eliquis due to increased fall risk.  He was seen in follow-up 04/2022 and was doing well with no reports of dizziness or syncope.  His blood pressure was well controlled at 126/72 and he was encouraged to continue to hydrate as tolerated.  He was seen in the ED most recently on 08/2022 for follow-up blood pressure.  Blood pressures were reported to monitor systolically at home however were well controlled at 126/61 at the ED.  Dylan [name] presents today for follow-up of bradycardia with his wife.  Since last being seen in the office patient's wife reports that he has elevated blood pressures and today pressure was initially 140/90 and was 126/72 on recheck.  He was seen recently in the ED for hypertension and blood pressures were normal upon arrival.  No medication changes were made at that time.  She also notes that he has experienced bradycardia at home with heart rates as low as the 30s no associated symptoms.  He reports that he has been taking Benadryl for sleep and at times has difficulty getting up following his dose the next morning.  She is also endorsed elevation in his systolic and diastolic blood pressures.  He is currently not using his abdominal binder due to increased blood pressures and she denies any syncope since his previous appointment in June..  Patient denies chest pain, palpitations, dyspnea, PND, orthopnea, nausea, vomiting, dizziness, syncope, edema, weight gain, or early satiety.  Home Medications    Current Outpatient Medications  Medication Sig Dispense Refill   Cholecalciferol (VITAMIN D3) 1000 units CAPS Take 1 capsule (1,000 Units total) by mouth daily. 30 capsule    fexofenadine (ALLEGRA ALLERGY) 180 MG tablet Take 1 tablet (180 mg total) by mouth daily.     finasteride (PROSCAR) 5 MG tablet Take 1 tablet by mouth once daily 90 tablet 0    FLUoxetine (PROZAC) 20 MG capsule Take 1 capsule by mouth once daily 30 capsule 3   guaiFENesin (MUCINEX) 600 MG 12 hr tablet Take 1,200 mg by mouth daily.     midodrine (PROAMATINE) 10 MG tablet Take 1 tablet (10 mg total) by mouth 3 (three) times daily before meals. 270 tablet 3   Polyethylene Glycol 3350 (MIRALAX PO) Take 17 g by mouth daily.     Probiotic Product (ALIGN PO) Take by mouth daily.     Sennosides (SENOKOT PO) Take 1 tablet by mouth at bedtime.     vitamin B-12 (V-R VITAMIN B-12) 500 MCG tablet Take 1 tablet (500 mcg total) by mouth daily. (Patient taking differently: Take 500 mcg by mouth at bedtime.)     diphenhydrAMINE HCl, Sleep, 25 MG CAPS Take 12.5 mg by mouth as directed. 2 times a week 28 capsule    No current facility-administered medications for this visit.     Review of Systems  Please see the history  of present illness.    (+) Weakness  All other systems reviewed and are otherwise negative except as noted above.  Physical Exam    Wt Readings from Last 3 Encounters:  09/26/22 181 lb 9.6 oz (82.4 kg)  08/25/22 183 lb (83 kg)  08/21/22 183 lb (83 kg)   VS: Vitals:   09/26/22 1328 09/26/22 1418  BP: (!) 140/90 126/72  Pulse: (!) 52   SpO2: 95%   ,Body mass index is 23.96 kg/m.  Constitutional:      Appearance: Healthy appearance. Not in distress.  Neck:     Vascular: JVD normal.  Pulmonary:     Effort: Pulmonary effort is normal.     Breath sounds: No wheezing. No rales. Diminished in the bases Cardiovascular:     Normal rate. Regular rhythm. Normal S1. Normal S2.      Murmurs: There is no murmur.  Edema:    Peripheral edema absent.  Abdominal:     Palpations: Abdomen is soft non tender. There is no hepatomegaly.  Skin:    General: Skin is warm and dry.  Neurological:     General: No focal deficit present.     Mental Status: Alert and oriented to person, place and time.     Cranial Nerves: Cranial nerves are intact.  EKG/LABS/Other Studies  Reviewed    ECG personally reviewed by me today -sinus bradycardia with PVCs and right bundle branch block with left anterior fascicular block and compensatory pause with no acute changes compared to previous EKG.    Lab Results  Component Value Date   WBC 7.5 08/25/2022   HGB 15.7 08/25/2022   HCT 46.9 08/25/2022   MCV 95.5 08/25/2022   PLT 124 (L) 08/25/2022   Lab Results  Component Value Date   CREATININE 1.43 (H) 08/25/2022   BUN 19 08/25/2022   NA 143 08/25/2022   K 4.5 08/25/2022   CL 109 08/25/2022   CO2 27 08/25/2022   Lab Results  Component Value Date   ALT 15 05/17/2022   AST 19 05/17/2022   ALKPHOS 65 05/17/2022   BILITOT 1.1 05/17/2022   Lab Results  Component Value Date   CHOL 168 04/22/2022   HDL 30 (L) 04/22/2022   LDLCALC 109 (H) 04/22/2022   LDLDIRECT 140.0 08/20/2021   TRIG 146 04/22/2022   CHOLHDL 5.6 04/22/2022    No results found for: "HGBA1C"  Assessment & Plan    1.  Syncope and collapse: -Patient denies any increased syncope or collapse  -He is no longer wearing abdominal binder and blood pressures have been elevated with diastolics in the 119J. -Continue to hydrate with water throughout the day as tolerated   2.  Orthostatic hypotension : -Continue midodrine and we will discontinue Florinef 0.1 mg now. -We will plan to discontinue midodrine if blood pressures remain elevated above goal of 130/80 -Patient's wife was encouraged to contact us if his blood pressure drops below 478 systolic and bradycardia continues patient becomes symptomatic -Bradycardia thought to be related to pulse ox counting PVCs.   3.  Chronic kidney disease stage III: -Renal function panel completed with creatinine of 1.37 and BUN of 25 -Followed by PCP encourage increase hydration as noted above   4.  Paroxysmal atrial flutter: -Eliquis 5 mg was discontinued due to patient's increased risk of fall and previous head trauma due to hypotension. -A shared decision  was completed with the patient's wife and review of CHA2DS2-VASc and stroke risk was discussed at  length with agreement to discontinue Eliquis -CHA2DS2-VASc Score = 3 [CHF History: 0, HTN History: 0, Diabetes History: 0, Stroke History: 0, Vascular Disease History: 1, Age Score: 2, Gender Score: 0].  Therefore, the patient's annual risk of stroke is 3.2 %.       5.  Dementia: -Advancing continue treatment plan per PCP   Follow-up with Quay Burow, MD or APP as scheduled    Medication Adjustments/Labs and Tests Ordered: Current medicines are reviewed at length with the patient today.  Concerns regarding medicines are outlined above.   Signed, Mable Fill, Marissa Nestle, NP 09/26/2022, 2:24 PM [facility]  Note:  This document was prepared using Dragon voice recognition software and may include unintentional dictation errors.

## 2022-09-26 ENCOUNTER — Ambulatory Visit: Payer: Medicare Other | Attending: Nurse Practitioner | Admitting: Nurse Practitioner

## 2022-09-26 ENCOUNTER — Encounter: Payer: Self-pay | Admitting: Nurse Practitioner

## 2022-09-26 VITALS — BP 126/72 | HR 52 | Ht 73.0 in | Wt 181.6 lb

## 2022-09-26 DIAGNOSIS — N1831 Chronic kidney disease, stage 3a: Secondary | ICD-10-CM

## 2022-09-26 DIAGNOSIS — R55 Syncope and collapse: Secondary | ICD-10-CM

## 2022-09-26 DIAGNOSIS — I951 Orthostatic hypotension: Secondary | ICD-10-CM

## 2022-09-26 DIAGNOSIS — F03918 Unspecified dementia, unspecified severity, with other behavioral disturbance: Secondary | ICD-10-CM | POA: Diagnosis not present

## 2022-09-26 DIAGNOSIS — I4892 Unspecified atrial flutter: Secondary | ICD-10-CM | POA: Diagnosis not present

## 2022-09-26 MED ORDER — DIPHENHYDRAMINE HCL (SLEEP) 25 MG PO CAPS: 12.5000 mg | ORAL_CAPSULE | ORAL | Status: DC

## 2022-09-26 NOTE — Patient Instructions (Signed)
Medication Instructions:  DISCONTINUE fludrocortisone (FLORINEF)  *If you need a refill on your cardiac medications before your next appointment, please call your pharmacy*   Lab Work: None ordered If you have labs (blood work) drawn today and your tests are completely normal, you will receive your results only by: [place] (if you have MyChart) OR A paper copy in the mail If you have any lab test that is abnormal or we need to change your treatment, we will call you to review the results.   Testing/Procedures: None Ordered   Follow-Up: At [facility], you and your health needs are our priority.  As part of our continuing mission to provide you with exceptional heart care, we have created designated Provider Care Teams.  These Care Teams include your primary Cardiologist (physician) and Advanced Practice Providers (APPs -  Physician Assistants and Nurse Practitioners) who all work together to provide you with the care you need, when you need it.  We recommend signing up for the patient portal called "MyChart".  Sign up information is provided on this After Visit Summary.  MyChart is used to connect with patients for Virtual Visits (Telemedicine).  Patients are able to view lab/test results, encounter notes, upcoming appointments, etc.  Non-urgent messages can be sent to your provider as well.   To learn more about what you can do with MyChart, go to [email or web address].    Your next appointment:   FOLLOW UP AS SCHEDULED  The format for your next appointment:   In Person  Provider:   Allegra Lai, MD    Other Instructions   Important Information About Sugar

## 2022-09-30 ENCOUNTER — Emergency Department: Payer: Medicare Other

## 2022-09-30 ENCOUNTER — Observation Stay
Admission: EM | Admit: 2022-09-30 | Discharge: 2022-10-01 | Disposition: A | Discharge status: Home / Self Care | Payer: Medicare Other | Attending: Internal Medicine | Admitting: Internal Medicine

## 2022-09-30 ENCOUNTER — Other Ambulatory Visit: Payer: Self-pay

## 2022-09-30 DIAGNOSIS — I5032 Chronic diastolic (congestive) heart failure: Secondary | ICD-10-CM | POA: Diagnosis present

## 2022-09-30 DIAGNOSIS — I130 Hypertensive heart and chronic kidney disease with heart failure and stage 1 through stage 4 chronic kidney disease, or unspecified chronic kidney disease: Secondary | ICD-10-CM | POA: Diagnosis not present

## 2022-09-30 DIAGNOSIS — R55 Syncope and collapse: Secondary | ICD-10-CM | POA: Diagnosis not present

## 2022-09-30 DIAGNOSIS — I4892 Unspecified atrial flutter: Secondary | ICD-10-CM | POA: Diagnosis not present

## 2022-09-30 DIAGNOSIS — R296 Repeated falls: Secondary | ICD-10-CM | POA: Diagnosis not present

## 2022-09-30 DIAGNOSIS — I4510 Unspecified right bundle-branch block: Secondary | ICD-10-CM

## 2022-09-30 DIAGNOSIS — D696 Thrombocytopenia, unspecified: Secondary | ICD-10-CM | POA: Diagnosis present

## 2022-09-30 DIAGNOSIS — I951 Orthostatic hypotension: Secondary | ICD-10-CM | POA: Diagnosis present

## 2022-09-30 DIAGNOSIS — R001 Bradycardia, unspecified: Secondary | ICD-10-CM | POA: Diagnosis not present

## 2022-09-30 DIAGNOSIS — I499 Cardiac arrhythmia, unspecified: Secondary | ICD-10-CM | POA: Diagnosis not present

## 2022-09-30 DIAGNOSIS — N1831 Chronic kidney disease, stage 3a: Secondary | ICD-10-CM | POA: Insufficient documentation

## 2022-09-30 DIAGNOSIS — R2689 Other abnormalities of gait and mobility: Secondary | ICD-10-CM | POA: Diagnosis not present

## 2022-09-30 DIAGNOSIS — N400 Enlarged prostate without lower urinary tract symptoms: Secondary | ICD-10-CM | POA: Diagnosis present

## 2022-09-30 DIAGNOSIS — F03911 Unspecified dementia, unspecified severity, with agitation: Secondary | ICD-10-CM | POA: Diagnosis present

## 2022-09-30 DIAGNOSIS — F32A Depression, unspecified: Secondary | ICD-10-CM | POA: Diagnosis present

## 2022-09-30 DIAGNOSIS — Z8616 Personal history of COVID-19: Secondary | ICD-10-CM | POA: Insufficient documentation

## 2022-09-30 DIAGNOSIS — Z87891 Personal history of nicotine dependence: Secondary | ICD-10-CM | POA: Diagnosis not present

## 2022-09-30 DIAGNOSIS — Z7901 Long term (current) use of anticoagulants: Secondary | ICD-10-CM | POA: Diagnosis not present

## 2022-09-30 DIAGNOSIS — E785 Hyperlipidemia, unspecified: Secondary | ICD-10-CM | POA: Diagnosis not present

## 2022-09-30 DIAGNOSIS — Z85828 Personal history of other malignant neoplasm of skin: Secondary | ICD-10-CM | POA: Insufficient documentation

## 2022-09-30 DIAGNOSIS — W19XXXA Unspecified fall, initial encounter: Secondary | ICD-10-CM | POA: Diagnosis not present

## 2022-09-30 DIAGNOSIS — F03918 Unspecified dementia, unspecified severity, with other behavioral disturbance: Secondary | ICD-10-CM | POA: Diagnosis present

## 2022-09-30 DIAGNOSIS — I444 Left anterior fascicular block: Secondary | ICD-10-CM | POA: Diagnosis not present

## 2022-09-30 DIAGNOSIS — Z743 Need for continuous supervision: Secondary | ICD-10-CM | POA: Diagnosis not present

## 2022-09-30 DIAGNOSIS — R531 Weakness: Secondary | ICD-10-CM | POA: Insufficient documentation

## 2022-09-30 DIAGNOSIS — F0390 Unspecified dementia without behavioral disturbance: Secondary | ICD-10-CM | POA: Insufficient documentation

## 2022-09-30 DIAGNOSIS — Z79899 Other long term (current) drug therapy: Secondary | ICD-10-CM | POA: Insufficient documentation

## 2022-09-30 DIAGNOSIS — Z66 Do not resuscitate: Secondary | ICD-10-CM | POA: Diagnosis not present

## 2022-09-30 DIAGNOSIS — R404 Transient alteration of awareness: Secondary | ICD-10-CM | POA: Diagnosis not present

## 2022-09-30 LAB — CBC
HCT: 44.3 % (ref 39.0–52.0)
Hemoglobin: 15 g/dL (ref 13.0–17.0)
MCH: 31.8 pg (ref 26.0–34.0)
MCHC: 33.9 g/dL (ref 30.0–36.0)
MCV: 94.1 fL (ref 80.0–100.0)
Platelets: 118 10*3/uL — ABNORMAL LOW (ref 150–400)
RBC: 4.71 MIL/uL (ref 4.22–5.81)
RDW: 13.4 % (ref 11.5–15.5)
WBC: 7.9 10*3/uL (ref 4.0–10.5)
nRBC: 0 % (ref 0.0–0.2)

## 2022-09-30 LAB — PROTIME-INR
INR: 1.1 (ref 0.8–1.2)
Prothrombin Time: 13.9 seconds (ref 11.4–15.2)

## 2022-09-30 LAB — BASIC METABOLIC PANEL
Anion gap: 10 (ref 5–15)
BUN: 14 mg/dL (ref 8–23)
CO2: 21 mmol/L — ABNORMAL LOW (ref 22–32)
Calcium: 9.1 mg/dL (ref 8.9–10.3)
Chloride: 110 mmol/L (ref 98–111)
Creatinine, Ser: 1.24 mg/dL (ref 0.61–1.24)
GFR, Estimated: 57 mL/min — ABNORMAL LOW (ref >60)
Glucose, Bld: 122 mg/dL — ABNORMAL HIGH (ref 70–99)
Potassium: 4.5 mmol/L (ref 3.5–5.1)
Sodium: 141 mmol/L (ref 135–145)

## 2022-09-30 LAB — TROPONIN I (HIGH SENSITIVITY): Troponin I (High Sensitivity): 9 ng/L (ref <18)

## 2022-09-30 LAB — BRAIN NATRIURETIC PEPTIDE: B Natriuretic Peptide: 471.9 pg/mL — ABNORMAL HIGH (ref 0.0–100.0)

## 2022-09-30 MED ORDER — ACETAMINOPHEN 325 MG PO TABS: 650.0000 mg | ORAL_TABLET | Freq: Four times a day (QID) | ORAL | Status: DC | PRN

## 2022-09-30 MED ORDER — SENNA 8.6 MG PO TABS
1.0000 | ORAL_TABLET | Freq: Every day | ORAL | Status: DC
  Administered 2022-09-30: 8.6 mg via ORAL
  Filled 2022-09-30: qty 1

## 2022-09-30 MED ORDER — FLUOXETINE HCL 20 MG PO CAPS
20.0000 mg | ORAL_CAPSULE | Freq: Every day | ORAL | Status: DC
  Administered 2022-10-01: 20 mg via ORAL
  Filled 2022-09-30: qty 1

## 2022-09-30 MED ORDER — VITAMIN B-12 1000 MCG PO TABS
500.0000 ug | ORAL_TABLET | Freq: Every day | ORAL | Status: DC
  Administered 2022-09-30 – 2022-10-01 (×2): 500 ug via ORAL
  Filled 2022-09-30 (×2): qty 1

## 2022-09-30 MED ORDER — ONDANSETRON HCL 4 MG/2ML IJ SOLN: 4.0000 mg | Freq: Three times a day (TID) | INTRAMUSCULAR | Status: DC | PRN

## 2022-09-30 MED ORDER — FINASTERIDE 5 MG PO TABS
5.0000 mg | ORAL_TABLET | Freq: Every day | ORAL | Status: DC
  Administered 2022-10-01: 5 mg via ORAL
  Filled 2022-09-30: qty 1

## 2022-09-30 MED ORDER — VITAMIN D 25 MCG (1000 UNIT) PO TABS
1000.0000 [IU] | ORAL_TABLET | Freq: Every day | ORAL | Status: DC
  Administered 2022-09-30 – 2022-10-01 (×2): 1000 [IU] via ORAL
  Filled 2022-09-30 (×2): qty 1

## 2022-09-30 MED ORDER — ENOXAPARIN SODIUM 40 MG/0.4ML IJ SOSY
40.0000 mg | PREFILLED_SYRINGE | INTRAMUSCULAR | Status: DC
  Administered 2022-09-30: 40 mg via SUBCUTANEOUS
  Filled 2022-09-30: qty 0.4

## 2022-09-30 MED ORDER — POLYETHYLENE GLYCOL 3350 17 G PO PACK
17.0000 g | PACK | Freq: Every day | ORAL | Status: DC
  Administered 2022-10-01: 17 g via ORAL
  Filled 2022-09-30: qty 1

## 2022-09-30 MED ORDER — SODIUM CHLORIDE 0.9 % IV SOLN
INTRAVENOUS | Status: DC
  Administered 2022-09-30: 75 mL/h via INTRAVENOUS

## 2022-09-30 MED ORDER — FLUDROCORTISONE ACETATE 0.1 MG PO TABS
0.1000 mg | ORAL_TABLET | Freq: Every day | ORAL | Status: DC
  Administered 2022-09-30 – 2022-10-01 (×2): 0.1 mg via ORAL
  Filled 2022-09-30 (×2): qty 1

## 2022-09-30 MED ORDER — GUAIFENESIN ER 600 MG PO TB12: 1200.0000 mg | ORAL_TABLET | Freq: Two times a day (BID) | ORAL | Status: DC | PRN

## 2022-09-30 MED ORDER — LORATADINE 10 MG PO TABS
10.0000 mg | ORAL_TABLET | Freq: Every day | ORAL | Status: DC
  Administered 2022-10-01: 10 mg via ORAL
  Filled 2022-09-30: qty 1

## 2022-09-30 MED ORDER — RISAQUAD PO CAPS
1.0000 | ORAL_CAPSULE | Freq: Every day | ORAL | Status: DC
  Administered 2022-10-01: 1 via ORAL
  Filled 2022-09-30: qty 1

## 2022-09-30 MED ORDER — MIDODRINE HCL 5 MG PO TABS
10.0000 mg | ORAL_TABLET | Freq: Three times a day (TID) | ORAL | Status: DC
  Administered 2022-09-30 (×2): 10 mg via ORAL
  Filled 2022-09-30 (×3): qty 2

## 2022-09-30 NOTE — Consult Note (Signed)
Cardiology Consult    Patient ID: QUINTEN ALLERTON MRN: [id number], DOB/AGE: 1934-08-22   Admit date: 09/30/2022 Date of Consult: 09/30/2022  Primary Physician: [name], MD Primary Cardiologist: Quay Burow, MD/EP: Elliot Cousin, MD Requesting Provider: Mora Bellman, MD  Patient Profile    Dylan [name] is a 88 y.o. male with a history of recurrent syncope, orthostatic hypotension, peripheral arterial disease, right bundle branch block, stage III chronic kidney disease, and dementia, who is being seen today for the evaluation of syncope and bradycardia at the request of Dr. Blaine Hamper.  Past Medical History   Past Medical History:  Diagnosis Date   Basal cell carcinoma 08/31/2020   Mid forehead - ED&C    Basal cell carcinoma of right nasal sidewall 12/15/2014   Bilateral hearing loss 08/03/2018   S/p audiology evaluation, has new hearing aides.   Bradycardia    a. 08/2018 ETT: Poor ex tolerance (2:10); b. 09/2018 s/p MDT Linq.   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 09/2018 s/p MDT Linq; b. 10/2021 Echo: EF 55-60%, no rwma, nl RV fxn, mild MR, triv AI.   Thrombocytopenia ([place]) 02/21/2011   periph smear stable 02/2020   Vitamin B12 deficiency 08/03/2018   Completed b12 shots 09/2018, maintaining levels on oral replacement (dissolvable tablets) 01/2019   Vitamin D deficiency 05/19/2018    Past Surgical History:  Procedure Laterality Date   BASAL CELL CARCINOMA EXCISION Right 01/2015   with reconstructive surgery around eye   CATARACT EXTRACTION Left 03/2017   ETT  09/2018   negative for ischemia, extremely poor exercise tolerance ([place])   FOOT SURGERY Left 1998   metatarsal amputation after injury   KNEE ARTHROSCOPY  1993   Right   KNEE ARTHROSCOPY  01/2001   Left   LOOP RECORDER INSERTION N/A 09/30/2018   Procedure: LOOP RECORDER INSERTION;  Surgeon: Constance Haw, MD;  Location: [facility];  Service: Cardiovascular;  Laterality: N/A;     Allergies  Allergies  Allergen Reactions   Doxepin Other (See Comments)    Lethargy, overly sedating at low dose   Seroquel [Quetiapine] Other (See Comments)    Worsened agitation, increased BP   Lincomycin Hcl Rash    History of Present Illness    88 year old male with a history of recurrent syncope, orthostatic hypotension  on midodrine (and recently Florinef but this was discontinued), peripheral arterial disease, right bundle branch block, stage III chronic kidney disease, and dementia.  History of recurrent syncope dates back to 2019.  He underwent [employer] Linq placement at that time.  He has been followed by Dr. Curt Bears and in November 2022, he was noted to have paroxysmal atrial flutter with rate control, and was placed on Eliquis.  In the setting of dementia, Mr.  Kohlmeyer is unable to provide many details related to his history however, his wife notes that he has never had symptomatic A-fib/flutter.  In June 2023, he was seen in the emergency department following a syncopal episode where his wife found him on the floor in the bathroom.  At that time, his Linq was still functioning and was interrogated showing a 12.5% A-fib burden with periodic bradycardia, generally during hours of sleep and associated with bigeminy, but no significant arrhythmias.  In the setting of recurrent syncope, oral anticoagulation was discontinued.  He was admitted again in July 2023 in the setting of bradycardia noted on home pulse oximetry.  In the ED, he was noted to have PVCs and ventricular bigeminy.  Implantable loop recorder was again interrogated and showed no significant events.  It was felt that the pulse oximetry recorded heart rates were artifactual in the setting of PVCs.  Florinef was added to midodrine in the setting of hypotension.  Mr. Fecher was most recently seen in cardiology clinic on November 16, at which time his wife reported blood pressures frequently in the 160-170 range at home.  In that setting, she had stopped placing his abdominal binder.  He had not had any recurrent presyncope/syncope since July and Florinef therapy was discontinued.  This morning, patient woke up as usual.  His wife checked his heart rate and noted that was in the 50s on pulse oximetry.  She got him up and helped him to shower.  He then was sitting on the closed toilet (not using the toilet), while his wife brushed his hair and he suddenly lost consciousness and leaned forward into his right.  His wife noted that he did not seem to be breathing.  She initially rested his head on a wall and called 911.  Upon their advice, she was able to drag him onto the floor where after about 5 minutes, she noted spontaneous respirations.  EMS arrived and within 15 minutes of onset, patient seemed to be alert.  ECG  showed sinus rhythm with ventricular trigeminy and rates in the 50s.  Initial blood pressure was 92/50.  He was taken to the [facility] ED where troponin is normal at 9.  BNP is elevated at 471.9.  Head CT is unremarkable.  ECG shows sinus rhythm at 78 with PVCs and bigeminy/trigeminy.  Left axis deviation, left anterior fascicular block, and right bundle branch block are also present.  Patient is currently asymptomatic and hemodynamically stable.  Wife at bedside.  Inpatient Medications     enoxaparin (LOVENOX) injection  40 mg Subcutaneous Q24H    Family History -history obtained from patient's wife    Family History  Problem Relation Age of Onset   Congestive Heart Failure Mother    Stroke Father 33   Congestive Heart Failure Father    Cataracts Sister    Leukemia Brother    He indicated that his mother is deceased. He indicated that his father is deceased. He indicated that his sister is alive. He indicated that two of his three brothers  are alive. He indicated that his maternal grandmother is deceased. He indicated that his maternal grandfather is deceased. He indicated that his paternal grandmother is deceased. He indicated that his paternal grandfather is deceased.   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, minerals, Glass blower/designer  Tobacco Use   Smoking status: Former    Types: Cigarettes    Quit date: 11/11/1960    Years since quitting: 61.9   Smokeless tobacco: Never  Vaping Use   Vaping Use: Never used  Substance and Sexual Activity   Alcohol use: No   Drug use: No   Sexual activity: Not Currently  Other Topics Concern   Not on file  Social History Narrative   Lives with wife  [place])   One stepson   Occ: retired, Furniture conservator/restorer, 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  Cardiovascular:  +++ sudden syncope this AM.  No chest pain, dyspnea on exertion, edema, orthopnea, palpitations, paroxysmal nocturnal dyspnea. Dermatological: No rash, lesions/masses Respiratory: No cough, dyspnea Urologic: No hematuria, dysuria Abdominal:   No nausea, vomiting, diarrhea, bright red blood per rectum, melena, or hematemesis Neurologic:  No visual changes, wkns, changes in mental status. All other systems reviewed and are otherwise negative except as noted above.  Physical Exam    Blood pressure 133/67, pulse (!) 54, temperature 97.8 F (36.6 C), temperature source Oral, resp. rate 17, height '6\' 1"'$  (1.854 m), weight 82 kg, SpO2 100 %.  General: Pleasant, NAD Psych: Flat affect. Neuro: Patient currently not responding to questions-nonverbal.  Follows commands.  Moves all extremities spontaneously. HEENT: Normal  Neck: Supple without bruits or JVD. Lungs:  Resp regular and unlabored, CTA. Heart: Irregular, distant, no s3, s4, or murmurs. Abdomen: Soft, non-tender, non-distended, BS + x 4.  Extremities: No clubbing, cyanosis or edema. DP/PT2+, Radials 2+ and equal bilaterally.  Labs    Cardiac Enzymes Recent Labs  Lab 09/30/22 0835  TROPONINIHS 9     BNP    Component Value Date/Time   BNP 296.8 (H) 03/17/2022 0506    ProBNP No results found for:  "PROBNP"  Lab Results  Component Value Date   WBC 7.9 09/30/2022   HGB 15.0 09/30/2022   HCT 44.3 09/30/2022   MCV 94.1 09/30/2022   PLT 118 (L) 09/30/2022    Recent Labs  Lab 09/30/22 0835  NA 141  K 4.5  CL 110  CO2 21*  BUN 14  CREATININE 1.24  CALCIUM 9.1  GLUCOSE 122*   Lab Results  Component Value Date   CHOL 168 04/22/2022   HDL  30 (L) 04/22/2022   LDLCALC 109 (H) 04/22/2022   TRIG 146 04/22/2022   Lab Results  Component Value Date   DDIMER <0.27 03/17/2022      Radiology Studies    CT Head Wo Contrast  Result Date: 09/30/2022 CLINICAL DATA:  Syncope EXAM: CT HEAD WITHOUT CONTRAST TECHNIQUE: Contiguous axial images were obtained from the base of the skull through the vertex without intravenous contrast. RADIATION DOSE REDUCTION: This exam was performed according to the departmental dose-optimization program which includes automated exposure control, adjustment of the mA and/or kV according to patient size and/or use of iterative reconstruction technique. COMPARISON:  CT head 04/16/2022 FINDINGS: Brain: There is no acute intracranial hemorrhage, extra-axial fluid collection, or acute infarct There is background parenchymal volume loss with apparent frontal lobe predilection, similar to the prior study. Patchy hypodensity in the supratentorial white matter predominantly affecting the frontal lobes is unchanged. Gray-white differentiation is preserved There is no mass lesion.  There is no mass effect or midline shift. Vascular: There is calcification of the bilateral carotid siphons. Skull: Normal. Negative for fracture or focal lesion. Sinuses/Orbits: The imaged paranasal sinuses are clear. The imaged globes and orbits are unremarkable. Other: None. IMPRESSION: 1. No acute intracranial pathology. 2. Unchanged parenchymal volume loss and supratentorial white matter disease both primarily affecting the frontal lobes. Electronically Signed   By: Valetta Mole M.D.   On:  09/30/2022 09:08    ECG & Cardiac Imaging    Sinus rhythm, 78, PVCs with bigeminy and trigeminy.  Left axis deviation, left anterior fascicular block, right bundle branch block.  No acute changes- personally reviewed.  Assessment & Plan    1.  Syncope: Patient with a history of recurrent syncope and orthostatic hypotension previously on both midodrine and Florinef in the outpatient setting though Florinef was recently discontinued in the setting of resting hypertension with pressures in the 160-170 range at home.  This morning, following a shower and while sitting and having his hair brushed by his wife, she noted sudden loss of consciousness, and what she describes as respiratory arrest.  She thinks it was about 5 minutes before she could confirm that he was breathing again, when she was able to lie him down on the floor.  He seemed to regain consciousness within about 15 minutes.  On EMS arrival, blood pressure was 92 systolic with heart rates in the 50s and frequent PVCs.  In the emergency department, ECG shows sinus rhythm with PVCs, bigeminy, and trigeminy, with known left axis deviation, left anterior fascicular block, and right bundle branch block.  No high-grade heart block or prolonged pauses on telemetry.  He is currently hemodynamically stable.  He does have an implantable loop recorder however, device reached RRT in August and is thus no longer functional.  Continue to follow telemetry while here.  I think we will need to add back at least low-dose Florinef and to be willing to live with resting hypertension in order to prevent events like today, assuming that high-grade heart block or ventricular arrhythmias are not at play.  2.  Orthostatic hypotension: See above.  Resume Florinef.  3.  Paroxysmal atrial fibrillation: 12.5% A. tach/A-fib burden on interrogation in June 2022.  His device is no longer functional.  He is in sinus rhythm today with frequent PVCs.  He is not on AV nodal blocking  agents secondary to baseline bradycardia with conduction abnormalities including left anterior fascicular block and right bundle branch block.  Anticoagulation previously discontinued in  the setting of recurrent syncope and falls.  Risk Assessment/Risk Scores:           Signed, Murray Hodgkins, NP 09/30/2022, 11:22 AM  For questions or updates, please contact   Please consult [email or web address] for contact info under Cardiology/STEMI.

## 2022-09-30 NOTE — ED Triage Notes (Signed)
Pt from home via ems, lives with wife. Pt was in bath room and had a syncopal episode. Unsure if pt fell and hit head. Pt denies pain. When ems arrived pt was in bigeminy and BP was 92/50.

## 2022-09-30 NOTE — ED Provider Notes (Signed)
[facility] Provider Note    Event Date/Time   First MD Initiated Contact with Patient 09/30/22 [phone]     (approximate)   History   Loss of Consciousness  Attempted to reach patient once listed power of attorney his wife and [name], on listed phone number.  It rings but no voicemail available.  EM caveat, some limitation as patient has dementia.  Attempted to reach patient's wife at listed contact number, no answer  HPI  Dylan [name] is a 88 y.o. male with a history of bradycardia, following with cardiology for episodes of syncope   EMS reports they were called to the patient's home the patient had an episode where he got lightheaded in the shower.  This was evidently witnessed by his wife per EMS, he then briefly passed out but did not suffer any notable fall or injury.  At the time EMS arrived his mental status had started to clear and he was waking up  Physical Exam   Triage Vital Signs: ED Triage Vitals [09/30/22 0821]  Enc Vitals Group     BP 137/65     Pulse Rate 68     Resp 16     Temp 97.8 F (36.6 C)     Temp Source Oral     SpO2 100 %     Weight 180 lb 12.4 oz (82 kg)     Height '6\' 1"'$  (1.854 m)     Head Circumference      Peak Flow      Pain Score 0     Pain Loc      Pain Edu?      Excl. in [place]?     Most recent vital signs: Vitals:   09/30/22 0840 09/30/22 0900  BP: 118/60 133/67  Pulse:  (!) 54  Resp: 14 17  Temp:    SpO2:       General: Awake, no distress.  Very pleasant.  Recognizes at a hospital.  Follows all commands.  Normocephalic atraumatic no cervical tenderness.  Moves extremities without difficulty and no injuries are noted.  He is not able to tell me the year or how he ended up here at the hospital CV:  Good peripheral perfusion.  Normal heart tones.  Rate is slow at palpable approximately 25-30 Resp:  Normal effort.  Clear lungs bilateral Abd:  No distention.  Soft nontender nondistended Other:     ED  Results / Procedures / Treatments   Labs (all labs ordered are listed, but only abnormal results are displayed) Labs Reviewed  CBC - Abnormal; Notable for the following components:      Result Value   Platelets 118 (*)    All other components within normal limits  BASIC METABOLIC PANEL - Abnormal; Notable for the following components:   CO2 21 (*)    Glucose, Bld 122 (*)    GFR, Estimated 57 (*)    All other components within normal limits  PROTIME-INR  TROPONIN I (HIGH SENSITIVITY)     EKG  Interpreted by me at 820 heart rate 80 QRS 180 QTc 440 Occasional sinus beats, occasional PVCs in a pattern seen as bigeminy, bifascicular block.  Some ectopy and irregularity are notable.  No obvious frank ischemia   RADIOLOGY  Personally interpreted the patient's CT scan of the head as negative for acute gross intracranial hemorrhage      PROCEDURES:  Critical Care performed: No  Procedures   MEDICATIONS ORDERED IN ED:  Medications - No data to display   IMPRESSION / MDM / [place] / ED COURSE  I reviewed the triage vital signs and the nursing notes.                              Differential diagnosis includes, but is not limited to, dysrhythmia, bradycardia, vasovagal vagal episode, etc.  He has a history of syncope and given he has bradycardia with palpable rate of approximately 25-30 but is fully alert and oriented normotensive he does not appear to need emergent pacing but I did discuss with cardiology who has seen him in the ER as of 9 AM, they recommend he be observed closely and they will continue to evaluate and provide consultation.  The patient does not seem to have suffered any injuries.  Given his dementia CT scan of the head was ordered  He endorses no symptoms at present.  Based on his previous history and today's presentation I suspect the patient is also suffering from episodes of syncope could be possibly secondary to symptomatic bradycardia.   Other causes are considered such as cardiac, other arrhythmias, electrolyte abnormalities and anemia etc. and we will evaluate broadly but based on initial evaluation suspect this was likely an episode of cardiogenic syncope or possibly that with a combination of vasogenic cause.   Patient's presentation is most consistent with acute presentation with potential threat to life or bodily function.  The patient is on the cardiac monitor to evaluate for evidence of arrhythmia and/or significant heart rate changes.  Labs reviewed normal CBC and initial troponin.  Chemistry normal with exception to very minimally reduced CO2.    Unable to contact patient's significant other, but based on patient's presentation today and clinical indications, will admit patient to the hospitalist service for further care and treatment with cardiology consulting  I consulted with her hospitalist Dr. Blaine Hamper, Patient will be admitted to the hospitalist service  FINAL CLINICAL IMPRESSION(S) / ED DIAGNOSES   Final diagnoses:  Syncope and collapse  Bradycardia     Rx / DC Orders   ED Discharge Orders     None        Note:  This document was prepared using Dragon voice recognition software and may include unintentional dictation errors.   Delman Kitten, MD 09/30/22 [phone]

## 2022-09-30 NOTE — H&P (Signed)
History and Physical    Dylan [name] WER:[id number] DOB: 29-Aug-1934 DOA: 09/30/2022  Referring MD/NP/PA:   PCP: [name], MD   Patient coming from:  The patient is coming from home.    Chief Complaint: syncope  HPI: HUE FRICK is a 88 y.o. male with medical history significant of of dementia, hyperlipidemia, A flutter off Eliquis, CKD stage IIIa, BPH, orthostatic hypotension, bradycardia, thrombocytopenia, PAD, C. difficile, hard of hearing, who presents with recurrent syncope.   Per his wife, patient passed out shortly when he was in the bathroom, no fall or injury.  Patient does not complain chest pain or shortness breath.  No respiratory distress.  No active nausea vomiting, diarrhea noted.  Patient does not have abdominal pain.  No symptoms of UTI.  He moves all extremities normally.  No facial droop or slurred speech.  Per EMS report, patient had blood pressure 92/50 and EKG strips showed ventricular bigeminy. Per his wife, his mental status is at baseline.  Of note, pt was seen by card in clinic on 11/16.  Eliquis was discontinued.  They d/c'ed Florinef  and continued midodrine.    Data reviewed independently and ED Course: pt was found to have WBC 7.9, troponin level 9, INR 1.1, stable renal function, temperature normal, blood pressure 133/67, heart rate is 68, 54, RR 17, oxygen saturation 100% on room air.  CT of head is negative.  Patient is placed on telemetry bed for observation, Dr. Fletcher Anon of cardiology is consulted for   EKG: I have personally reviewed.  Sinus rhythm, QTc 436, heart rate of 78, bifascicular block, early R wave progression, ventricular bigeminy   Review of Systems:   General: no fevers, chills, no body weight gain, fatigue HEENT: no blurry vision, hearing changes or sore throat Respiratory: no dyspnea, coughing, wheezing CV: no chest pain, no palpitations GI: no nausea, vomiting, abdominal pain, diarrhea, constipation GU: no dysuria, burning  on urination, increased urinary frequency, hematuria  Ext: no leg edema Neuro: no unilateral weakness, numbness, or tingling, no vision change or hearing loss.  Has syncope Skin: no rash, no skin tear. MSK: No muscle spasm, no deformity, no limitation of range of movement in spin Heme: No easy bruising.  Travel history: No recent long distant travel.   Allergy:  Allergies  Allergen Reactions   Doxepin Other (See Comments)    Lethargy, overly sedating at low dose   Seroquel [Quetiapine] Other (See Comments)    Worsened agitation, increased BP   Lincomycin Hcl Rash    Past Medical History:  Diagnosis Date   Basal cell carcinoma 08/31/2020   Mid forehead - ED&C    Basal cell carcinoma of right nasal sidewall 12/15/2014   Bilateral hearing loss 08/03/2018   S/p audiology evaluation, has new hearing aides.   Bradycardia    a. 08/2018 ETT: Poor ex tolerance (2:10); b. 09/2018 s/p MDT Linq.   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   CKD (chronic kidney disease) stage 3, GFR 30-59 ml/min (HCC) 01/13/2019   Clostridium difficile colitis 05/2021   COVID-19 virus infection 03/17/2022   Dementia ([place])    Dysphagia 02/19/2020   ERECTILE DYSFUNCTION, MILD 02/03/2007   Qualifier: Diagnosis of  By: Council Mechanic MD, Hilaria Ota    Eversion of right lacrimal punctum 05/17/2015   Exposure keratopathy, 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   HYDROCELE, LEFT 02/03/2007   Hyperlipidemia 12/1999   Malignant  neoplasm of skin of right eyelid including canthus 12/14/2014   Mohs defect of eyelid 12/14/2014   Nuclear sclerosis, left 04/01/2017   Obstruction of right lacrimal duct 10/16/2015   PAD (peripheral artery disease) ([place]) 03/21/2020   Abnormal ABI, abnormal arterial duplex 03/2020:  R - moderate RLE disease, abnormal TBI L - WNL, abnormal TBI Referred for vascular consult - rec medical management   Pruritus 02/19/2020   Recurrent  syncope 06/22/2018   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   Right leg swelling 02/19/2020   Sepsis ([place]) 05/30/2020   Syncope    a. 09/2018 s/p MDT Linq; b. 10/2021 Echo: EF 55-60%, no rwma, nl RV fxn, mild MR, triv AI.   Thrombocytopenia ([place]) 02/21/2011   periph smear stable 02/2020   Vitamin B12 deficiency 08/03/2018   Completed b12 shots 09/2018, maintaining levels on oral replacement (dissolvable tablets) 01/2019   Vitamin D deficiency 05/19/2018    Past Surgical History:  Procedure Laterality Date   BASAL CELL CARCINOMA EXCISION Right 01/2015   with reconstructive surgery around eye   CATARACT EXTRACTION Left 03/2017   ETT  09/2018   negative for ischemia, extremely poor exercise tolerance ([place])   FOOT SURGERY Left 1998   metatarsal amputation after injury   KNEE ARTHROSCOPY  1993   Right   KNEE ARTHROSCOPY  01/2001   Left   LOOP RECORDER INSERTION N/A 09/30/2018   Procedure: LOOP RECORDER INSERTION;  Surgeon: Constance Haw, MD;  Location: [facility];  Service: Cardiovascular;  Laterality: N/A;    Social History:  reports that he quit smoking about 61 years ago. His smoking use included cigarettes. He has never used smokeless tobacco. He reports that he does not drink alcohol and does not use drugs.  Family History:  Family History  Problem Relation Age of Onset   Congestive Heart Failure Mother    Stroke Father 42   Congestive Heart Failure Father    Cataracts Sister    Leukemia Brother      Prior to Admission medications   Medication Sig Start Date End Date Taking? Authorizing Provider  Cholecalciferol (VITAMIN D3) 1000 units CAPS Take 1 capsule (1,000 Units total) by mouth daily. 08/03/18   [name], MD  diphenhydrAMINE HCl, Sleep, 25 MG CAPS Take 12.5 mg by mouth as directed. 2 times a week 09/26/22   Marylu Lund., NP  fexofenadine [facility] ALLERGY) 180 MG tablet Take 1 tablet (180 mg total) by  mouth daily. 02/19/22   [name], MD  finasteride (PROSCAR) 5 MG tablet Take 1 tablet by mouth once daily 09/10/22   [name], MD  FLUoxetine [facility]) 20 MG capsule Take 1 capsule by mouth once daily 09/24/22   [name], MD  guaiFENesin (MUCINEX) 600 MG 12 hr tablet Take 1,200 mg by mouth daily.    [provider]  midodrine (PROAMATINE) 10 MG tablet Take 1 tablet (10 mg total) by mouth 3 (three) times daily before meals. 06/11/22   Baldwin Jamaica, PA-C  Polyethylene Glycol 3350 (MIRALAX PO) Take 17 g by mouth daily.    [provider]  Probiotic Product (ALIGN PO) Take by mouth daily.    [provider]  Sennosides (SENOKOT PO) Take 1 tablet by mouth at bedtime.    [provider]  vitamin B-12 (V-R VITAMIN B-12) 500 MCG tablet Take 1 tablet (500 mcg total) by mouth daily. Patient taking differently: Take 500 mcg by mouth  at bedtime. 08/11/19   [name], MD    Physical Exam: Vitals:   09/30/22 1300 09/30/22 1400 09/30/22 1500 09/30/22 1700  BP: 121/64 (!) 127/57 128/75 124/67  Pulse:  (!) 59  62  Resp: '18 16 17 18  '$ Temp: 97.7 F (36.5 C)   98.4 F (36.9 C)  TempSrc: Axillary   Oral  SpO2: 98%  98% 100%  Weight:      Height:       General: Not in acute distress HEENT:       Eyes: PERRL, EOMI, no scleral icterus.       ENT: No discharge from the ears and nose, no pharynx injection, no tonsillar enlargement.        Neck: No JVD, no bruit, no mass felt. Heme: No neck lymph node enlargement. Cardiac: S1/S2, RRR, No murmurs, No gallops or rubs. Respiratory: No rales, wheezing, rhonchi or rubs. GI: Soft, nondistended, nontender, no rebound pain, no organomegaly, BS present. GU: No hematuria Ext: No pitting leg edema bilaterally. 1+DP/PT pulse bilaterally. Musculoskeletal: No joint deformities, No joint redness or warmth, no limitation of ROM in spin. Skin: No rashes.  Neuro: Alert, following command, cranial  nerves II-XII grossly intact, moves all extremities normally.  Psych: Patient is not psychotic, no suicidal or hemocidal ideation.  Labs on Admission: I have personally reviewed following labs and imaging studies  CBC: Recent Labs  Lab 09/30/22 0835  WBC 7.9  HGB 15.0  HCT 44.3  MCV 94.1  PLT 676*   Basic Metabolic Panel: Recent Labs  Lab 09/30/22 0835  NA 141  K 4.5  CL 110  CO2 21*  GLUCOSE 122*  BUN 14  CREATININE 1.24  CALCIUM 9.1   GFR: Estimated Creatinine Clearance: 48.3 mL/min (by C-G formula based on SCr of 1.24 mg/dL). Liver Function Tests: No results for input(s): "AST", "ALT", "ALKPHOS", "BILITOT", "PROT", "ALBUMIN" in the last 168 hours. No results for input(s): "LIPASE", "AMYLASE" in the last 168 hours. No results for input(s): "AMMONIA" in the last 168 hours. Coagulation Profile: Recent Labs  Lab 09/30/22 0835  INR 1.1   Cardiac Enzymes: No results for input(s): "CKTOTAL", "CKMB", "CKMBINDEX", "TROPONINI" in the last 168 hours. BNP (last 3 results) No results for input(s): "PROBNP" in the last 8760 hours. HbA1C: No results for input(s): "HGBA1C" in the last 72 hours. CBG: No results for input(s): "GLUCAP" in the last 168 hours. Lipid Profile: No results for input(s): "CHOL", "HDL", "LDLCALC", "TRIG", "CHOLHDL", "LDLDIRECT" in the last 72 hours. Thyroid Function Tests: No results for input(s): "TSH", "T4TOTAL", "FREET4", "T3FREE", "THYROIDAB" in the last 72 hours. Anemia Panel: No results for input(s): "VITAMINB12", "FOLATE", "FERRITIN", "TIBC", "IRON", "RETICCTPCT" in the last 72 hours. Urine analysis:    Component Value Date/Time   COLORURINE STRAW (A) 05/16/2022 1636   APPEARANCEUR CLEAR (A) 05/16/2022 1636   LABSPEC 1.008 05/16/2022 1636   PHURINE 7.0 05/16/2022 1636   GLUCOSEU NEGATIVE 05/16/2022 1636   HGBUR NEGATIVE 05/16/2022 1636   BILIRUBINUR NEGATIVE 05/16/2022 1636   BILIRUBINUR 1+ 06/05/2021 1635   KETONESUR NEGATIVE  05/16/2022 1636   PROTEINUR NEGATIVE 05/16/2022 1636   UROBILINOGEN 0.2 06/05/2021 1635   NITRITE NEGATIVE 05/16/2022 1636   LEUKOCYTESUR NEGATIVE 05/16/2022 1636   Sepsis Labs: '@LABRCNTIP'$ (procalcitonin:4,lacticidven:4) )No results found for this or any previous visit (from the past 240 hour(s)).   Radiological Exams on Admission: CT Head Wo Contrast  Result Date: 09/30/2022 CLINICAL DATA:  Syncope EXAM: CT HEAD WITHOUT CONTRAST TECHNIQUE: Contiguous axial  images were obtained from the base of the skull through the vertex without intravenous contrast. RADIATION DOSE REDUCTION: This exam was performed according to the departmental dose-optimization program which includes automated exposure control, adjustment of the mA and/or kV according to patient size and/or use of iterative reconstruction technique. COMPARISON:  CT head 04/16/2022 FINDINGS: Brain: There is no acute intracranial hemorrhage, extra-axial fluid collection, or acute infarct There is background parenchymal volume loss with apparent frontal lobe predilection, similar to the prior study. Patchy hypodensity in the supratentorial white matter predominantly affecting the frontal lobes is unchanged. Gray-white differentiation is preserved There is no mass lesion.  There is no mass effect or midline shift. Vascular: There is calcification of the bilateral carotid siphons. Skull: Normal. Negative for fracture or focal lesion. Sinuses/Orbits: The imaged paranasal sinuses are clear. The imaged globes and orbits are unremarkable. Other: None. IMPRESSION: 1. No acute intracranial pathology. 2. Unchanged parenchymal volume loss and supratentorial white matter disease both primarily affecting the frontal lobes. Electronically Signed   By: Valetta Mole M.D.   On: 09/30/2022 09:08      Assessment/Plan Principal Problem:   Syncope Active Problems:   Orthostatic hypotension   Bradycardia   Paroxysmal atrial flutter (HCC)   Chronic diastolic CHF  (congestive heart failure) (HCC)   Thrombocytopenia (HCC)   BPH (benign prostatic hyperplasia)   Depression   Chronic kidney disease, stage 3a (HCC)   Assessment and Plan:  Syncope: CT head negative.  Likely multifactorial etiology, including orthostatic status and bradycardia. Consulted Dr. Fletcher Anon of card -Placed on telemetry bed for observation -For precaution -Check orthostatic vital sign -IV fluid: 75 cc/h -Telemetry monitoring -Frequent neurochecks -PT/OT  Orthostatic hypotension -Midodrine  Bradycardia -Avoid using nodal blockers  Paroxysmal atrial flutter [facility]): Patient is not taking blood thinner due to fall and syncope.  Patient has bradycardia -Telemetry monitoring  Chronic diastolic CHF (congestive heart failure) ([place]): 2D echo on 05/17/2022 showed EF of 55 to 60% with grade 2 diastolic dysfunction.  Patient does not have leg edema JVD.  CHF is compensated. -Check BNP  Thrombocytopenia ([place]): This is chronic issue.  Platelets 118, no active bleeding. -Follow-up with CBC  BPH (benign prostatic hyperplasia) -Continue Proscar  Depression -Continue home medications  Chronic kidney disease, stage 3a ([place]): Stable -Follow-up with BMI of    DVT ppx: sQ Lovenox  Code Status: DNR per his wife   Family Communication:   Yes, patient's wife   at bed side.       Disposition Plan:  Anticipate discharge back to previous environment  Consults called:  Dr. Fletcher Anon of card  Admission status and Level of care: Telemetry Cardiac:   for obs     Dispo: The patient is from: Home              Anticipated d/c is to: Home              Anticipated d/c date is: 1 day              Patient currently is not medically stable to d/c.    Severity of Illness:  The appropriate patient status for this patient is OBSERVATION. Observation status is judged to be reasonable and necessary in order to provide the required intensity of service to ensure the patient's safety. The  patient's presenting symptoms, physical exam findings, and initial radiographic and laboratory data in the context of their medical condition is felt to place them at decreased risk for further clinical deterioration.  Furthermore, it is anticipated that the patient will be medically stable for discharge from the hospital within 2 midnights of admission.        Date of Service 09/30/2022    [facility]   If 7PM-7AM, please contact night-coverage [email or web address] 09/30/2022, 7:36 PM

## 2022-10-01 ENCOUNTER — Inpatient Hospital Stay (HOSPITAL_COMMUNITY)
Admit: 2022-10-01 | Discharge: 2022-10-01 | Disposition: A | Discharge status: Home / Self Care | Payer: Medicare Other | Attending: Nurse Practitioner | Admitting: Nurse Practitioner

## 2022-10-01 ENCOUNTER — Observation Stay: Payer: Medicare Other

## 2022-10-01 ENCOUNTER — Other Ambulatory Visit: Payer: Medicare Other | Admitting: Nurse Practitioner

## 2022-10-01 DIAGNOSIS — R55 Syncope and collapse: Secondary | ICD-10-CM

## 2022-10-01 DIAGNOSIS — D696 Thrombocytopenia, unspecified: Secondary | ICD-10-CM | POA: Diagnosis not present

## 2022-10-01 DIAGNOSIS — N1831 Chronic kidney disease, stage 3a: Secondary | ICD-10-CM | POA: Diagnosis not present

## 2022-10-01 DIAGNOSIS — F32A Depression, unspecified: Secondary | ICD-10-CM

## 2022-10-01 DIAGNOSIS — I951 Orthostatic hypotension: Secondary | ICD-10-CM | POA: Diagnosis not present

## 2022-10-01 DIAGNOSIS — I5032 Chronic diastolic (congestive) heart failure: Secondary | ICD-10-CM | POA: Diagnosis not present

## 2022-10-01 DIAGNOSIS — I4892 Unspecified atrial flutter: Secondary | ICD-10-CM | POA: Diagnosis not present

## 2022-10-01 DIAGNOSIS — N400 Enlarged prostate without lower urinary tract symptoms: Secondary | ICD-10-CM

## 2022-10-01 DIAGNOSIS — F0390 Unspecified dementia without behavioral disturbance: Secondary | ICD-10-CM

## 2022-10-01 LAB — URINALYSIS, COMPLETE (UACMP) WITH MICROSCOPIC
Bacteria, UA: NONE SEEN
Bilirubin Urine: NEGATIVE
Glucose, UA: NEGATIVE mg/dL
Hgb urine dipstick: NEGATIVE
Ketones, ur: NEGATIVE mg/dL
Leukocytes,Ua: NEGATIVE
Nitrite: NEGATIVE
Protein, ur: NEGATIVE mg/dL
Specific Gravity, Urine: 1.012 (ref 1.005–1.030)
Squamous Epithelial / HPF: NONE SEEN (ref 0–5)
pH: 6 (ref 5.0–8.0)

## 2022-10-01 LAB — BASIC METABOLIC PANEL
Anion gap: 5 (ref 5–15)
BUN: 14 mg/dL (ref 8–23)
CO2: 25 mmol/L (ref 22–32)
Calcium: 8.3 mg/dL — ABNORMAL LOW (ref 8.9–10.3)
Chloride: 111 mmol/L (ref 98–111)
Creatinine, Ser: 1.33 mg/dL — ABNORMAL HIGH (ref 0.61–1.24)
GFR, Estimated: 52 mL/min — ABNORMAL LOW (ref >60)
Glucose, Bld: 88 mg/dL (ref 70–99)
Potassium: 3.4 mmol/L — ABNORMAL LOW (ref 3.5–5.1)
Sodium: 141 mmol/L (ref 135–145)

## 2022-10-01 MED ORDER — FLUDROCORTISONE ACETATE 0.1 MG PO TABS: 0.1000 mg | ORAL_TABLET | Freq: Every day | ORAL | 0 refills | Status: DC

## 2022-10-01 NOTE — Assessment & Plan Note (Signed)
Creatinine 1.33 with a GFR 52 upon discharge

## 2022-10-01 NOTE — Assessment & Plan Note (Signed)
Likely secondary to orthostatic hypotension.

## 2022-10-01 NOTE — Assessment & Plan Note (Signed)
On Proscar 

## 2022-10-01 NOTE — Assessment & Plan Note (Signed)
Chronic thrombocytopenia

## 2022-10-01 NOTE — Assessment & Plan Note (Signed)
Restarted Florinef 0.1 mg daily.  Continue midodrine 10 mg 3 times a day.  Patient declined wearing TED hose.  Advised to stay hydrated.  Advised sitting up into the bed before standing up.  Before stopping these medications must check blood pressure after standing for 3 minutes.

## 2022-10-01 NOTE — Evaluation (Signed)
Physical Therapy Evaluation Patient Details Name: Dylan [name] MRN: [id number] DOB: Jun 18, 1934 Today's Date: 10/01/2022  History of Present Illness  88 y/o male presented to ED on 09/30/22 after syncopal episode in bathroom. CT head negative. PMH: dementia, orthostatic hypotension, PAD, CKD IIIa, Aflutter, hx of Cdiff.  Clinical Impression  Patient admitted with the above. PTA, patient lives with wife who assists with ADLs and mobility but patient able to mobilize with no AD. Reports 10 falls in past year due to syncopal episodes. OT saw prior to PT session and obtain orthostatic vitals which were normal with no significant decrease in SBP with transitions. Patient required minA to stand from recliner and ambulated with HHA and min guard in hallway. Denies dizziness throughout mobility. Wife present and discussed with her about close supervision/assistance initially for safety due to changing environment but otherwise seems to be close to baseline. Patient will benefit from skilled PT services during acute stay to address listed deficits. No PT follow up recommended at this time.        Recommendations for follow up therapy are one component of a multi-disciplinary discharge planning process, led by the attending physician.  Recommendations may be updated based on patient status, additional functional criteria and insurance authorization.  Follow Up Recommendations No PT follow up      Assistance Recommended at Discharge Frequent or constant Supervision/Assistance  Patient can return home with the following  A little help with walking and/or transfers;A little help with bathing/dressing/bathroom;Assistance with cooking/housework;Direct supervision/assist for financial management;Direct supervision/assist for medications management;Assist for transportation;Help with stairs or ramp for entrance    Equipment Recommendations None recommended by PT  Recommendations for Other Services        Functional Status Assessment Patient has had a recent decline in their functional status and demonstrates the ability to make significant improvements in function in a reasonable and predictable amount of time.     Precautions / Restrictions Precautions Precautions: Fall Precaution Comments: watch BP/HR Restrictions Weight Bearing Restrictions: No      Mobility  Bed Mobility               General bed mobility comments: In recliner on arrival    Transfers Overall transfer level: Needs assistance Equipment used: None Transfers: Sit to/from Stand Sit to Stand: Min assist           General transfer comment: assist to boost into standing. Patient using momentum to stand    Ambulation/Gait Ambulation/Gait assistance: Min guard Gait Distance (Feet): 120 Feet Assistive device: 1 person hand held assist Gait Pattern/deviations: Step-through pattern, Decreased stride length Gait velocity: decreased     General Gait Details: min guard for safety. Patient utilizing HHA offered but feel as though patient does not require HHA. No LOB and denies dizziness throughout  Stairs            Wheelchair Mobility    Modified Rankin (Stroke Patients Only)       Balance Overall balance assessment: Mild deficits observed, not formally tested, History of Falls                                           Pertinent Vitals/Pain Pain Assessment Pain Assessment: Faces Faces Pain Scale: No hurt Pain Intervention(s): Monitored during session    Home Living Family/patient expects to be discharged to:: Private residence Living Arrangements: Spouse/significant other Available Help  at Discharge: Family;Available 24 hours/day Type of Home: House Home Access: Stairs to enter Entrance Stairs-Rails: Can reach both;Left;Right Entrance Stairs-Number of Steps: 3   Home Layout: One level Home Equipment: Cane - single point      Prior Function Prior Level of  Function : Needs assist;History of Falls (last six months)             Mobility Comments: per wife, patient ambulates with no AD but requires supervision for safety. 10 falls in past year due to syncopal episodes.       Hand Dominance        Extremity/Trunk Assessment   Upper Extremity Assessment Upper Extremity Assessment: Defer to OT evaluation    Lower Extremity Assessment Lower Extremity Assessment: Generalized weakness    Cervical / Trunk Assessment Cervical / Trunk Assessment: Normal  Communication   Communication: HOH  Cognition Arousal/Alertness: Awake/alert Behavior During Therapy: WFL for tasks assessed/performed Overall Cognitive Status: History of cognitive impairments - at baseline                                          General Comments General comments (skin integrity, edema, etc.): Per OT, orthostatic vitals normal without significant drop in BP. Denies dizziness throughout    Exercises     Assessment/Plan    PT Assessment Patient needs continued PT services  PT Problem List Decreased strength;Decreased activity tolerance;Decreased mobility;Decreased coordination;Decreased balance;Decreased cognition;Decreased safety awareness       PT Treatment Interventions DME instruction;Gait training;Functional mobility training;Therapeutic activities;Balance training;Therapeutic exercise;Stair training;Patient/family education    PT Goals (Current goals can be found in the Care Plan section)  Acute Rehab PT Goals Patient Stated Goal: did not state PT Goal Formulation: With family Time For Goal Achievement: 10/15/22 Potential to Achieve Goals: Fair    Frequency Min 2X/week     Co-evaluation               AM-PAC PT "6 Clicks" Mobility  Outcome Measure Help needed turning from your back to your side while in a flat bed without using bedrails?: A Little Help needed moving from lying on your back to sitting on the side of a  flat bed without using bedrails?: A Little Help needed moving to and from a bed to a chair (including a wheelchair)?: A Little Help needed standing up from a chair using your arms (e.g., wheelchair or bedside chair)?: A Little Help needed to walk in hospital room?: A Little Help needed climbing 3-5 steps with a railing? : A Little 6 Click Score: 18    End of Session Equipment Utilized During Treatment: Gait belt Activity Tolerance: Patient tolerated treatment well Patient left: in chair;with call bell/phone within reach;with chair alarm set Nurse Communication: Mobility status PT Visit Diagnosis: Unsteadiness on feet (R26.81);Muscle weakness (generalized) (M62.81);History of falling (Z91.81)    Time: 5400-8676 PT Time Calculation (min) (ACUTE ONLY): 19 min   Charges:   PT Evaluation $PT Eval Low Complexity: 1 Low          Daneen Volcy A. Gilford Rile PT, DPT Acute Rehabilitation Services Office [phone]   Linna Hoff 10/01/2022, 12:03 PM

## 2022-10-01 NOTE — Assessment & Plan Note (Signed)
on Prozac.

## 2022-10-01 NOTE — TOC Initial Note (Signed)
Transition of Care [facility]) - Initial/Assessment Note    Patient Details  Name: Dylan [name] MRN: [id number] Date of Birth: 21-Aug-1934  Transition of Care [facility]) CM/SW Contact:    Laurena Slimmer, RN Phone Number: 10/01/2022, 12:26 PM  Clinical Narrative:                  Transition of Care (TOC) Screening Note   Patient Details  Name: Dylan [name] Date of Birth: 1934/09/11   Transition of Care [facility]) CM/SW Contact:    Laurena Slimmer, RN Phone Number: 10/01/2022, 12:27 PM    Transition of Care Department [facility]) has reviewed patient and no TOC needs have been identified at this time. We will continue to monitor patient advancement through interdisciplinary progression rounds. If new patient transition needs arise, please place a TOC consult.          Patient Goals and CMS Choice        Expected Discharge Plan and Services           Expected Discharge Date: 10/01/22                                    Prior Living Arrangements/Services                       Activities of Daily Living Home Assistive Devices/Equipment: Dentures (specify type), Hearing aid, Shower chair with back ADL Screening (condition at time of admission) Patient's cognitive ability adequate to safely complete daily activities?: No Is the patient deaf or have difficulty hearing?: Yes Does the patient have difficulty seeing, even when wearing glasses/contacts?: No Does the patient have difficulty concentrating, remembering, or making decisions?: Yes Patient able to express need for assistance with ADLs?: No Does the patient have difficulty dressing or bathing?: Yes Independently performs ADLs?: No Communication: Independent Dressing (OT): Needs assistance Is this a change from baseline?: Pre-admission baseline Grooming: Needs assistance Is this a change from baseline?: Pre-admission baseline Feeding: Independent Is this a change from baseline?: Pre-admission  baseline Bathing: Needs assistance Is this a change from baseline?: Pre-admission baseline Toileting: Needs assistance Is this a change from baseline?: Pre-admission baseline In/Out Bed: Independent Walks in Home: Independent Does the patient have difficulty walking or climbing stairs?: Yes Weakness of Legs: None Weakness of Arms/Hands: None  Permission Sought/Granted                  Emotional Assessment              Admission diagnosis:  Syncope and collapse [R55] Bradycardia [R00.1] Syncope [R55] Patient Active Problem List   Diagnosis Date Noted   Syncope 09/30/2022   Depression 09/30/2022   Chronic diastolic CHF (congestive heart failure) ([place]) 09/30/2022   Bradycardia 09/30/2022   Chronic kidney disease, stage 3a ([place]) 04/16/2022   BPH (benign prostatic hyperplasia) 03/17/2022   DNR (do not resuscitate) 01/11/2022   Paroxysmal atrial flutter ([place]) 11/05/2021   Clostridium difficile colitis 06/04/2021   Left testicular pain 06/04/2021   Sacral pressure sore 06/04/2021   Symptomatic bradycardia 06/04/2021   Fecal impaction ([place])    Constipation 05/04/2021   Proteinuria 05/04/2021   Orthostatic hypotension 05/04/2021   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   Sepsis with acute organ dysfunction ([place]) 05/30/2020   PAD (peripheral artery disease) ([place]) 03/21/2020   Right leg swelling 02/19/2020   Dysphagia 02/19/2020   Pruritus 02/19/2020  Medicare annual wellness visit, subsequent 08/11/2019   CKD (chronic kidney disease) stage 3, GFR 30-59 ml/min (HCC) 01/13/2019   Health maintenance examination 08/03/2018   Advanced care planning/counseling discussion 08/03/2018   Vitamin B12 deficiency 08/03/2018   Bilateral hearing loss 08/03/2018   Recurrent syncope 06/22/2018   Vitamin D deficiency 05/19/2018   Fall 05/19/2018   Dementia with behavioral disturbance 05/19/2018   Cataract extraction status 03/06/2017   Obstruction of right lacrimal duct 10/16/2015    Exposure keratopathy, 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   Eversion of right lacrimal punctum 05/17/2015   Epiphora due to insufficient drainage of right side 05/17/2015   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   Basal cell carcinoma of right nasal sidewall 12/15/2014   Mohs defect of tear duct of right eye 12/15/2014   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

## 2022-10-01 NOTE — Discharge Summary (Signed)
Physician Discharge Summary   Patient: Dylan [name] MRN: [id number] DOB: 29-Feb-1934  Admit date:     09/30/2022  Discharge date: 10/01/22  Discharge Physician: Loletha Grayer   PCP: [name], MD   Recommendations at discharge:   Follow-up PCP  Discharge Diagnoses: Principal Problem:   Syncope Active Problems:   Orthostatic hypotension   Bradycardia   Paroxysmal atrial flutter (HCC)   Chronic diastolic CHF (congestive heart failure) (HCC)   Thrombocytopenia (HCC)   BPH (benign prostatic hyperplasia)   Depression   Chronic kidney disease, stage 3a (HCC)   Dementia without behavioral disturbance [facility])    Hospital Course: The patient was brought in as an observation on 09/30/2022 and discharged on 10/01/2022.  Patient coming in with syncope.  Florinef was recently discontinued.  With EMS the patient did have a low blood pressure of 92/50.  Ventricular bigeminy seen on EKG strips.  Cardiology was consulted and will set up for monitor as outpatient and restarted Florinef.  Assessment and Plan: * Syncope Likely secondary to orthostatic hypotension.  Orthostatic hypotension Restarted Florinef 0.1 mg daily.  Continue midodrine 10 mg 3 times a day.  Patient declined wearing TED hose.  Advised to stay hydrated.  Advised sitting up into the bed before standing up.  Before stopping these medications must check blood pressure after standing for 3 minutes.  Bradycardia Heart rate 63 upon discharge.  Paroxysmal atrial flutter (HCC) Eliquis discontinued as outpatient.  Chronic diastolic CHF (congestive heart failure) (HCC) No current signs of heart failure  Thrombocytopenia (HCC) Chronic thrombocytopenia  BPH (benign prostatic hyperplasia) On Proscar  Depression on Prozac.  Chronic kidney disease, stage 3a (HCC) Creatinine 1.33 with a GFR 52 upon discharge  Dementia without behavioral disturbance ([place]) Will discharge home.  Patient did not sleep last night in  the hospital.  Likely will get better sleep and is familiar environment.         Consultants: Cardiology Procedures performed: None Disposition: Home Diet recommendation:  Regular diet DISCHARGE MEDICATION: Allergies as of 10/01/2022       Reactions   Doxepin Other (See Comments)   Lethargy, overly sedating at low dose   Seroquel [quetiapine] Other (See Comments)   Worsened agitation, increased BP   Lincomycin Hcl Rash        Medication List     STOP taking these medications    diphenhydrAMINE HCl (Sleep) 25 MG Caps       TAKE these medications    ALIGN PO Take by mouth daily.   cyanocobalamin 500 MCG tablet Commonly known as: V-R VITAMIN B-12 Take 1 tablet (500 mcg total) by mouth daily. What changed: when to take this   fexofenadine 180 MG tablet Commonly known as: Allegra Allergy Take 1 tablet (180 mg total) by mouth daily.   finasteride 5 MG tablet Commonly known as: PROSCAR Take 1 tablet by mouth once daily   fludrocortisone 0.1 MG tablet Commonly known as: FLORINEF Take 1 tablet (0.1 mg total) by mouth daily. Start taking on: October 02, 2022   FLUoxetine 20 MG capsule Commonly known as: PROZAC Take 1 capsule by mouth once daily   guaiFENesin 600 MG 12 hr tablet Commonly known as: MUCINEX Take 1,200 mg by mouth daily.   midodrine 10 MG tablet Commonly known as: PROAMATINE Take 1 tablet (10 mg total) by mouth 3 (three) times daily before meals.   MIRALAX PO Take 17 g by mouth daily.   SENOKOT PO Take 1 tablet by mouth  at bedtime.   Vitamin D3 25 MCG (1000 UT) Caps Take 1 capsule (1,000 Units total) by mouth daily.        Follow-up Information     [name], MD. Go in 5 day(s).   Specialty: Family Medicine Why: Appointment on Wednesday, 10/09/2022 at 11:00am. Contact information: [place] [address] [phone]                Discharge Exam: [name] Weights   09/30/22 0821   Weight: 82 kg   Physical Exam HENT:     Head: Normocephalic.     Mouth/Throat:     Pharynx: No oropharyngeal exudate.  Eyes:     General: Lids are normal.     Conjunctiva/sclera: Conjunctivae normal.  Cardiovascular:     Rate and Rhythm: Normal rate and regular rhythm.     Heart sounds: Normal heart sounds, S1 normal and S2 normal.  Pulmonary:     Breath sounds: Normal breath sounds. No decreased breath sounds, wheezing, rhonchi or rales.  Abdominal:     Palpations: Abdomen is soft.     Tenderness: There is no abdominal tenderness.  Musculoskeletal:     Right lower leg: No swelling.     Left lower leg: No swelling.  Skin:    General: Skin is warm.     Findings: No rash.  Neurological:     Mental Status: He is alert.      Condition at discharge: stable  The results of significant diagnostics from this hospitalization (including imaging, microbiology, ancillary and laboratory) are listed below for reference.   Imaging Studies: DG Chest Port 1 View  Result Date: 10/01/2022 CLINICAL DATA:  Syncope. EXAM: PORTABLE CHEST 1 VIEW COMPARISON:  05/16/2022 FINDINGS: The lungs are clear without focal pneumonia, edema, pneumothorax or pleural effusion. Cardiopericardial silhouette is at upper limits of normal for size. The visualized bony structures of the thorax are unremarkable. Telemetry leads overlie the chest. IMPRESSION: No active disease. Electronically Signed   By: Misty Stanley M.D.   On: 10/01/2022 08:39   CT Head Wo Contrast  Result Date: 09/30/2022 CLINICAL DATA:  Syncope EXAM: CT HEAD WITHOUT CONTRAST TECHNIQUE: Contiguous axial images were obtained from the base of the skull through the vertex without intravenous contrast. RADIATION DOSE REDUCTION: This exam was performed according to the departmental dose-optimization program which includes automated exposure control, adjustment of the mA and/or kV according to patient size and/or use of iterative reconstruction  technique. COMPARISON:  CT head 04/16/2022 FINDINGS: Brain: There is no acute intracranial hemorrhage, extra-axial fluid collection, or acute infarct There is background parenchymal volume loss with apparent frontal lobe predilection, similar to the prior study. Patchy hypodensity in the supratentorial white matter predominantly affecting the frontal lobes is unchanged. Gray-white differentiation is preserved There is no mass lesion.  There is no mass effect or midline shift. Vascular: There is calcification of the bilateral carotid siphons. Skull: Normal. Negative for fracture or focal lesion. Sinuses/Orbits: The imaged paranasal sinuses are clear. The imaged globes and orbits are unremarkable. Other: None. IMPRESSION: 1. No acute intracranial pathology. 2. Unchanged parenchymal volume loss and supratentorial white matter disease both primarily affecting the frontal lobes. Electronically Signed   By: Valetta Mole M.D.   On: 09/30/2022 09:08    Microbiology: Results for orders placed or performed during the hospital encounter of 05/16/22  Blood culture (routine x 2)     Status: None   Collection Time: 05/16/22  5:35 PM  Specimen: BLOOD  Result Value Ref Range Status   Specimen Description BLOOD BLRA  Final   Special Requests BOTTLES DRAWN AEROBIC AND ANAEROBIC [place]  Final   Culture   Final    NO GROWTH 5 DAYS Performed at [facility], [address] [place], [place] [address]    Report Status 05/21/2022 FINAL  Final  Blood culture (routine x 2)     Status: None   Collection Time: 05/16/22  5:40 PM   Specimen: BLOOD  Result Value Ref Range Status   Specimen Description BLOOD RFOA  Final   Special Requests BOTTLES DRAWN AEROBIC AND ANAEROBIC BCAV  Final   Culture   Final    NO GROWTH 5 DAYS Performed at [facility], [address] [place], [place] [address]    Report Status 05/21/2022 FINAL  Final    Labs: CBC: Recent Labs  Lab 09/30/22 0835  WBC 7.9  HGB 15.0   HCT 44.3  MCV 94.1  PLT 353*   Basic Metabolic Panel: Recent Labs  Lab 09/30/22 0835 10/01/22 0450  NA 141 141  K 4.5 3.4*  CL 110 111  CO2 21* 25  GLUCOSE 122* 88  BUN 14 14  CREATININE 1.24 1.33*  CALCIUM 9.1 8.3*     Discharge time spent: greater than 30 minutes.  Signed: Loletha Grayer, MD [facility] 10/01/2022

## 2022-10-01 NOTE — Progress Notes (Signed)
Jennefer Bravo Klapper to be D/C'd Home per MD order.  Discussed prescriptions and follow up appointments with the patient and wife. Prescriptions given to patient, medication list explained in detail. Wife verbalized understanding.  Allergies as of 10/01/2022       Reactions   Doxepin Other (See Comments)   Lethargy, overly sedating at low dose   Seroquel [quetiapine] Other (See Comments)   Worsened agitation, increased BP   Lincomycin Hcl Rash        Medication List     STOP taking these medications    diphenhydrAMINE HCl (Sleep) 25 MG Caps       TAKE these medications    ALIGN PO Take by mouth daily.   cyanocobalamin 500 MCG tablet Commonly known as: V-R VITAMIN B-12 Take 1 tablet (500 mcg total) by mouth daily. What changed: when to take this   fexofenadine 180 MG tablet Commonly known as: Allegra Allergy Take 1 tablet (180 mg total) by mouth daily.   finasteride 5 MG tablet Commonly known as: PROSCAR Take 1 tablet by mouth once daily   fludrocortisone 0.1 MG tablet Commonly known as: FLORINEF Take 1 tablet (0.1 mg total) by mouth daily. Start taking on: October 02, 2022   FLUoxetine 20 MG capsule Commonly known as: PROZAC Take 1 capsule by mouth once daily   guaiFENesin 600 MG 12 hr tablet Commonly known as: MUCINEX Take 1,200 mg by mouth daily.   midodrine 10 MG tablet Commonly known as: PROAMATINE Take 1 tablet (10 mg total) by mouth 3 (three) times daily before meals.   MIRALAX PO Take 17 g by mouth daily.   SENOKOT PO Take 1 tablet by mouth at bedtime.   Vitamin D3 25 MCG (1000 UT) Caps Take 1 capsule (1,000 Units total) by mouth daily.        Vitals:   10/01/22 0859 10/01/22 1224  BP: (!) 145/63 (!) 148/93  Pulse: 60 63  Resp: 20 18  Temp: 98.1 F (36.7 C) 97.6 F (36.4 C)  SpO2: 95% 97%    Tele box removed and returned. Skin clean, dry and intact without evidence of skin break down, no evidence of skin tears noted. IV catheter  discontinued intact. Site without signs and symptoms of complications. Dressing and pressure applied. Pt denies pain at this time. No complaints noted.  An After Visit Summary was printed and given to the patient. Patient escorted via [place], and D/C home via private auto.  Rolley Sims

## 2022-10-01 NOTE — Assessment & Plan Note (Signed)
Eliquis discontinued as outpatient.

## 2022-10-01 NOTE — Assessment & Plan Note (Signed)
Will discharge home.  Patient did not sleep last night in the hospital.  Likely will get better sleep and is familiar environment.

## 2022-10-01 NOTE — Assessment & Plan Note (Signed)
No current signs of heart failure

## 2022-10-01 NOTE — Evaluation (Signed)
Occupational Therapy Evaluation Patient Details Name: Dylan [name] MRN: [id number] DOB: July 15, 1934 Today's Date: 10/01/2022   History of Present Illness 88 y/o male presented to ED on 09/30/22 after syncopal episode in bathroom. CT head negative. PMH: dementia, orthostatic hypotension, PAD, CKD IIIa, Aflutter, hx of Cdiff.   Clinical Impression   Patient seen for OT evaluation. Spouse present. Pt presenting with decreased independence in self care, balance, functional mobility/transfers, endurance, and safety awareness. Prior to admission, pt received assistance from wife for all ADLs and IADLs. Pt is a poor historian 2/2 baseline cognitive deficits, PLOF obtained from spouse. Pt currently functioning at [place] A for simulated toilet transfer, Min guard for functional mobility to the recliner, and Min A for dressing tasks. Negative for orthostatic hypotension during evaluation, no c/o dizziness. Pt oriented to self and being in the "hospital." He required verbal cues for safety awareness and sequencing of tasks throughout. Pt currently has 24/7 supervision/assistance. Pt appears close to baseline level of function with ADLs. Will keep on OT caseload to continue ADL training in order to prevent functional decline and reduce caregiver burden. No follow up OT needs recommended at this time.     Recommendations for follow up therapy are one component of a multi-disciplinary discharge planning process, led by the attending physician.  Recommendations may be updated based on patient status, additional functional criteria and insurance authorization.   Follow Up Recommendations  No OT follow up     Assistance Recommended at Discharge Frequent or constant Supervision/Assistance  Patient can return home with the following A little help with walking and/or transfers;Direct supervision/assist for medications management;Direct supervision/assist for financial management;A little help with  bathing/dressing/bathroom;Help with stairs or ramp for entrance;Assistance with cooking/housework;Assist for transportation    Functional Status Assessment  Patient has had a recent decline in their functional status and demonstrates the ability to make significant improvements in function in a reasonable and predictable amount of time.  Equipment Recommendations  None recommended by OT    Recommendations for Other Services       Precautions / Restrictions Precautions Precautions: Fall Precaution Comments: watch BP/HR Restrictions Weight Bearing Restrictions: No      Mobility Bed Mobility Overal bed mobility: Needs Assistance Bed Mobility: Supine to Sit     Supine to sit: Supervision, HOB elevated          Transfers Overall transfer level: Needs assistance Equipment used: None Transfers: Sit to/from Stand Sit to Stand: Min assist           General transfer comment: STS from EOB and recliner      Balance Overall balance assessment: History of Falls, Mild deficits observed, not formally tested                                         ADL either performed or assessed with clinical judgement   ADL Overall ADL's : Needs assistance/impaired                 Upper Body Dressing : Minimal assistance Upper Body Dressing Details (indicate cue type and reason): don/doff gown Lower Body Dressing: Minimal assistance Lower Body Dressing Details (indicate cue type and reason): to don bedroom shoes Toilet Transfer: Minimal assistance;Cueing for safety;Cueing for sequencing Toilet Transfer Details (indicate cue type and reason): simulated with STS from EOB Toileting- Clothing Manipulation and Hygiene: Maximal assistance;Sit to/from stand [place]  Manipulation Details (indicate cue type and reason): assist with doffing soiled brief     Functional mobility during ADLs: Min guard;Cueing for safety;Cueing for sequencing;Rolling walker (2  wheels) (to take steps toward recliner) General ADL Comments: NT present to assist with linen change (bed soiled)     Vision Patient Visual Report: No change from baseline       Perception     Praxis      Pertinent Vitals/Pain Pain Assessment Pain Assessment: No/denies pain     Hand Dominance Right   Extremity/Trunk Assessment Upper Extremity Assessment Upper Extremity Assessment: Generalized weakness   Lower Extremity Assessment Lower Extremity Assessment: Generalized weakness   Cervical / Trunk Assessment Cervical / Trunk Assessment: Normal   Communication Communication Communication: HOH (hearing aids present)   Cognition Arousal/Alertness: Awake/alert Behavior During Therapy: WFL for tasks assessed/performed Overall Cognitive Status: History of cognitive impairments - at baseline                                 General Comments: Oriented to self and being at the "hospital." Wife reports pt is not normally oriented to the date. Decreased safety awareness, step by step VCs     General Comments  Supine BP: 131/74 (MAP 90). Sitting BP: 139/84 (MAP 101). SpO2 maintained >90% on RA. Denied dizziness throughout.    Exercises Other Exercises Other Exercises: OT provided education re: role of OT, OT POC, post acute recs, sitting up for all meals, EOB/OOB mobility with assistance, home/fall safety.     Shoulder Instructions      Home Living Family/patient expects to be discharged to:: Private residence Living Arrangements: Spouse/significant other Available Help at Discharge: Family;Available 24 hours/day Type of Home: House Home Access: Stairs to enter [employer] of Steps: 3 Entrance Stairs-Rails: Can reach both;Left;Right Home Layout: One level     Bathroom Shower/Tub: Occupational psychologist: Handicapped height     Home Equipment: Cane - single point;Rolling Walker (2 wheels);Grab bars - toilet;Grab bars -  tub/shower;Shower seat - built in;BSC/3in1          Prior Functioning/Environment Prior Level of Function : Needs assist;History of Falls (last six months)  Cognitive Assist : Mobility (cognitive);ADLs (cognitive) Mobility (Cognitive): Step by step cues ADLs (Cognitive): Step by step cues Physical Assist : Mobility (physical);ADLs (physical) Mobility (physical): Bed mobility;Transfers;Gait;Stairs ADLs (physical): IADLs;Bathing;Feeding;Grooming;Dressing;Toileting Mobility Comments: per wife, patient ambulates with no AD but requires supervision for safety. 10 falls in past year due to syncopal episodes. ADLs Comments: Wife assists with all ADLs (supervision for bathing) and IADLs (including medication management), sometimes able to do light housekeeping tasks with VC for sequencing/initiation, set up for self feeding, wife drives, incontinent at baseline (wears depends)        OT Problem List: Decreased cognition;Decreased safety awareness;Decreased knowledge of use of DME or AE;Decreased knowledge of precautions;Decreased strength;Decreased activity tolerance;Impaired balance (sitting and/or standing);Decreased coordination      OT Treatment/Interventions: Self-care/ADL training;Therapeutic exercise;Therapeutic activities;DME and/or AE instruction;Patient/family education;Balance training;Cognitive remediation/compensation    OT Goals(Current goals can be found in the care plan section) Acute Rehab OT Goals Patient Stated Goal: none stated OT Goal Formulation: With family Time For Goal Achievement: 10/15/22 Potential to Achieve Goals: Fair   OT Frequency: Min 2X/week    Co-evaluation              AM-PAC OT "6 Clicks" Daily Activity     Outcome  Measure Help from another person eating meals?: A Little Help from another person taking care of personal grooming?: A Little Help from another person toileting, which includes using toliet, bedpan, or urinal?: A Lot Help from  another person bathing (including washing, rinsing, drying)?: A Little Help from another person to put on and taking off regular upper body clothing?: A Little Help from another person to put on and taking off regular lower body clothing?: A Little 6 Click Score: 17   End of Session Equipment Utilized During Treatment: Gait belt;Rolling walker (2 wheels) Nurse Communication: Mobility status  Activity Tolerance: Patient tolerated treatment well Patient left: in chair;with call bell/phone within reach;with chair alarm set  OT Visit Diagnosis: Unsteadiness on feet (R26.81);Repeated falls (R29.6);Other symptoms and signs involving cognitive function;Muscle weakness (generalized) (M62.81)                Time: 5790-3833 OT Time Calculation (min): 33 min Charges:  OT General Charges $OT Visit: 1 Visit OT Evaluation $OT Eval Low Complexity: 1 Low  [facility] MS, OTR/L ascom [phone]  10/01/22, 1:10 PM

## 2022-10-01 NOTE — Assessment & Plan Note (Signed)
Heart rate 63 upon discharge.

## 2022-10-01 NOTE — Progress Notes (Signed)
Patient continues attempting to leave the bed. Attempted to reorient without success. Bed alarm on, call bell within reach.

## 2022-10-01 NOTE — Progress Notes (Signed)
Cardiology Progress Note   Patient Name: Dylan [name] Date of Encounter: 10/01/2022  Primary Cardiologist: Quay Burow, MD  Subjective   No recurrent presyncope or syncope.  Maintaining sinus rhythm with frequent PVCs on telemetry.  No evidence of high-grade heart block.  Wife at bedside.  Apparently became restless last night.  Inpatient Medications    Scheduled Meds:  acidophilus  1 capsule Oral Daily   cholecalciferol  1,000 Units Oral Daily   cyanocobalamin  500 mcg Oral Daily   enoxaparin (LOVENOX) injection  40 mg Subcutaneous Q24H   finasteride  5 mg Oral Daily   fludrocortisone  0.1 mg Oral Daily   FLUoxetine  20 mg Oral Daily   loratadine  10 mg Oral Daily   midodrine  10 mg Oral TID WC   polyethylene glycol  17 g Oral Daily   senna  1 tablet Oral QHS   Continuous Infusions:  sodium chloride 75 mL/hr at 10/01/22 0437   PRN Meds: acetaminophen, guaiFENesin, ondansetron (ZOFRAN) IV   Vital Signs    Vitals:   10/01/22 0323 10/01/22 0324 10/01/22 0520 10/01/22 0859  BP: (!) 95/53 (!) 95/53  (!) 145/63  Pulse: 66 65  60  Resp: '17  17 20  '$ Temp: 98.6 F (37 C) 98.7 F (37.1 C)  98.1 F (36.7 C)  TempSrc: Oral Oral  Oral  SpO2: 95% 96%  95%  Weight:      Height:        Intake/Output Summary (Last 24 hours) at 10/01/2022 1007 Last data filed at 10/01/2022 0437 Gross per 24 hour  Intake 944.96 ml  Output 2 ml  Net 942.96 ml   Filed Weights   09/30/22 0821  Weight: 82 kg    Physical Exam   GEN: Well nourished, well developed, in no acute distress.  HEENT: Grossly normal.  Neck: Supple, no JVD, carotid bruits, or masses. Cardiac: Irregular, distant, no murmurs, rubs, or gallops. No clubbing, cyanosis, edema.  Radials 2+, DP/PT 2+ and equal bilaterally.  Respiratory:  Respirations regular and unlabored, clear to auscultation bilaterally. GI: Soft, nontender, nondistended, BS + x 4. MS: no deformity or atrophy. Skin: warm and dry, no  rash. Neuro:  Strength and sensation are intact. Psych: Gives a thumbs up when asked any questions.  Labs    Chemistry Recent Labs  Lab 09/30/22 0835 10/01/22 0450  NA 141 141  K 4.5 3.4*  CL 110 111  CO2 21* 25  GLUCOSE 122* 88  BUN 14 14  CREATININE 1.24 1.33*  CALCIUM 9.1 8.3*  GFRNONAA 57* 52*  ANIONGAP 10 5     Hematology Recent Labs  Lab 09/30/22 0835  WBC 7.9  RBC 4.71  HGB 15.0  HCT 44.3  MCV 94.1  MCH 31.8  MCHC 33.9  RDW 13.4  PLT 118*    Cardiac Enzymes  Recent Labs  Lab 09/30/22 0835  TROPONINIHS 9      BNP    Component Value Date/Time   BNP 471.9 (H) 09/30/2022 0835    Lipids  Lab Results  Component Value Date   CHOL 168 04/22/2022   HDL 30 (L) 04/22/2022   LDLCALC 109 (H) 04/22/2022   LDLDIRECT 140.0 08/20/2021   TRIG 146 04/22/2022   CHOLHDL 5.6 04/22/2022    Radiology    DG Chest Port 1 View  Result Date: 10/01/2022 CLINICAL DATA:  Syncope. EXAM: PORTABLE CHEST 1 VIEW COMPARISON:  05/16/2022 FINDINGS: The lungs are clear without focal pneumonia,  edema, pneumothorax or pleural effusion. Cardiopericardial silhouette is at upper limits of normal for size. The visualized bony structures of the thorax are unremarkable. Telemetry leads overlie the chest. IMPRESSION: No active disease. Electronically Signed   By: Misty Stanley M.D.   On: 10/01/2022 08:39   CT Head Wo Contrast  Result Date: 09/30/2022 CLINICAL DATA:  Syncope EXAM: CT HEAD WITHOUT CONTRAST TECHNIQUE: Contiguous axial images were obtained from the base of the skull through the vertex without intravenous contrast. RADIATION DOSE REDUCTION: This exam was performed according to the departmental dose-optimization program which includes automated exposure control, adjustment of the mA and/or kV according to patient size and/or use of iterative reconstruction technique. COMPARISON:  CT head 04/16/2022 FINDINGS: Brain: There is no acute intracranial hemorrhage, extra-axial fluid  collection, or acute infarct There is background parenchymal volume loss with apparent frontal lobe predilection, similar to the prior study. Patchy hypodensity in the supratentorial white matter predominantly affecting the frontal lobes is unchanged. Gray-white differentiation is preserved There is no mass lesion.  There is no mass effect or midline shift. Vascular: There is calcification of the bilateral carotid siphons. Skull: Normal. Negative for fracture or focal lesion. Sinuses/Orbits: The imaged paranasal sinuses are clear. The imaged globes and orbits are unremarkable. Other: None. IMPRESSION: 1. No acute intracranial pathology. 2. Unchanged parenchymal volume loss and supratentorial white matter disease both primarily affecting the frontal lobes. Electronically Signed   By: Valetta Mole M.D.   On: 09/30/2022 09:08    Telemetry    Sinus bradycardia to sinus rhythm with frequent PVCs, rates typically in the 50s to 60s.  No evidence of high-grade heart block or prolonged pauses.  Brief runs of SVT/atrial tachycardia- Personally Reviewed  Cardiac Studies   2D Echocardiogram 7.7.2023   1. Left ventricular ejection fraction, by estimation, is 55 to 60%. The  left ventricle has normal function. The left ventricle has no regional  wall motion abnormalities. Left ventricular diastolic parameters are  consistent with Grade II diastolic  dysfunction (pseudonormalization).   2. Right ventricular systolic function is normal. The right ventricular  size is normal.   3. Left atrial size was mildly dilated.   4. The mitral valve is normal in structure. Mild mitral valve  regurgitation. No evidence of mitral stenosis.   5. The aortic valve was not well visualized. Aortic valve regurgitation  is mild. No aortic stenosis is present.   6. The inferior vena cava is normal in size with greater than 50%  respiratory variability, suggesting right atrial pressure of 3 mmHg.  _____________   Patient Profile      88 y.o. male  with a history of recurrent syncope, orthostatic hypotension, peripheral arterial disease, right bundle branch block, stage III chronic kidney disease, and dementia, who was admitted November 20 following syncopal spell.  Assessment & Plan    Syncope: Patient with a history of recurrent syncope and orthostatic hypotension previously on both midodrine and Florinef in the outpatient setting, the Florinef was discontinued November 16 in the setting of resting hypertension.  Prolonged syncopal spell in November 20 that occurred while sitting and having his hair brushed by his wife.  There was a question of respiratory arrest as well.  He was found to be hypotensive by EMS.  ECG and telemetry have shown sinus rhythm with frequent PVCs.  No prolonged arrhythmias, pauses, or evidence of high-grade heart block.  No recurrent symptoms.  We have resumed his Florinef and think he will do better with  resting hypertension in order to prevent hypotension with changes in position.  Continue midodrine.  We will arrange for a 2-week ZIO monitor to be placed prior to discharge.  2.  Orthostatic hypotension: See above.  Florinef should be continued at discharge in addition to midodrine.  3.  Paroxysmal atrial fibrillation: 12.5% atrial tachycardia/A-fib burden on interrogation of his Medtr[employer] Linq in June 2022.  His device is no longer functional.  Maintaining sinus rhythm with frequent PVCs.  No AV nodal blocking agents secondary to baseline bradycardia with conduction abnormalities including left anterior fascicular block and right bundle branch block.  Anticoagulation previously discontinued in the setting of recurrent syncope and falls.  4.  Stage III chronic kidney disease: Appears to be stable.  Wife at bedside.  Encourage adequate hydration.  Signed, Murray Hodgkins, NP  10/01/2022, 10:07 AM    For questions or updates, please contact   Please consult [email or web address] for contact info under  Cardiology/STEMI.

## 2022-10-02 DIAGNOSIS — R55 Syncope and collapse: Secondary | ICD-10-CM | POA: Diagnosis not present

## 2022-10-04 ENCOUNTER — Telehealth: Payer: Self-pay

## 2022-10-04 NOTE — Telephone Encounter (Signed)
Transition Care Management Follow-up Telephone Call Date of discharge and from where: 10/01/22  [facility] How have you been since you were released from the hospital? Improved Any questions or concerns? Yes; fluctuations with blood pressure   Items Reviewed: Did the pt receive and understand the discharge instructions provided? Yes  Medications obtained and verified? Yes  Other?  N/a  Any new allergies since your discharge? No  Dietary orders reviewed? Yes Do you have support at home? Yes   Home Care and Equipment/Supplies: Were home health services ordered? no If so, what is the name of the agency? N/a   Has the agency set up a time to come to the patient's home? not applicable Were any new equipment or medical supplies ordered?  No What is the name of the medical supply agency? N/a  Were you able to get the supplies/equipment? not applicable Do you have any questions related to the use of the equipment or supplies? No  Functional Questionnaire: (I = Independent and D = Dependent) ADLs: D  Bathing/Dressing- I  Meal Prep- D  Eating- I  Maintaining continence- I  Transferring/Ambulation- I  Managing Meds- D  Follow up appointments reviewed:  PCP Hospital f/u appt confirmed? Yes  Scheduled to see Dr. Danise Mina on 10/09/22 @ 11. [facility] f/u appt confirmed? Yes  Scheduled to see Cardiology-Dr. Curt Bears on 10/17/22 @ 3:45. Are transportation arrangements needed? No  If their condition worsens, is the pt aware to call PCP or go to the Emergency Dept.? Yes Was the patient provided with contact information for the PCP's office or ED? Yes Was to pt encouraged to call back with questions or concerns? Yes

## 2022-10-09 ENCOUNTER — Ambulatory Visit (INDEPENDENT_AMBULATORY_CARE_PROVIDER_SITE_OTHER): Payer: Medicare Other | Admitting: Family Medicine

## 2022-10-09 ENCOUNTER — Encounter: Payer: Self-pay | Admitting: Family Medicine

## 2022-10-09 VITALS — BP 130/80 | HR 52 | Temp 97.8°F | Ht 72.0 in | Wt 182.4 lb

## 2022-10-09 DIAGNOSIS — I951 Orthostatic hypotension: Secondary | ICD-10-CM

## 2022-10-09 DIAGNOSIS — I4892 Unspecified atrial flutter: Secondary | ICD-10-CM | POA: Diagnosis not present

## 2022-10-09 DIAGNOSIS — R55 Syncope and collapse: Secondary | ICD-10-CM | POA: Diagnosis not present

## 2022-10-09 NOTE — Progress Notes (Unsigned)
Patient ID: Dylan [name], male    DOB: December 13, 1933, 88 y.o.   MRN: [id number]  This visit was conducted in person.  BP 130/80   Pulse (!) 52   Temp 97.8 F (36.6 C) (Temporal)   Ht 6' (1.829 m)   Wt 182 lb 6 oz (82.7 kg)   SpO2 100%   BMI 24.73 kg/m    CC: hosp f/u visit  Subjective:   HPI: Dylan [name] is a 88 y.o. male presenting on 10/09/2022 for Hospitalization Follow-up (Admitted on 09/30/22 at [facility], dx syncope and collapse; bradycardia. Pt accompanied by wife, [name]. )   Recent hospitalization for syncope due to orthostatic hypotension by EMS down to 92/50. Florinef recently discontinued.  Cardiology was consulted.  Ventricular bigeminy on EKG.  Florinef 0.'1mg'$  daily was restarted, midodrine '10mg'$  TID was continued.  Planned zio patch through 10/16/2022.  Hospital records reviewed. Med rec performed.   Known PAF - off eliquis AC due to fall risk.  PRN benadryl was stopped.  Had Linq monitor but that has stopped working, still in place.   Routine - vitals checked when he first sits up in the morning at 7am, then eats breakfast at side of bed, then stands up and BP is checked When home, he drinks propel 2-3 times daily.   Since home, notes significantly fluctuating blood pressures - systolics run low 809-983, diastolics running high 38-250N.  Pt is unable to tolerate TED hose. Pt had not been using abdominal binder due to fluctuating pressures.   Home health not set up.  Other follow up appointments scheduled: cardiology Dr Curt Bears 10/17/2022 ______________________________________________________________________ Hospital admission: 09/30/2022 Hospital discharge: 10/01/2022 TCM f/u phone call: 10/04/2022 performed   Recommendations at discharge:  Follow-up PCP   Discharge Diagnoses: Principal Problem:   Syncope Active Problems:   Orthostatic hypotension   Bradycardia   Paroxysmal atrial flutter (HCC)   Chronic diastolic CHF (congestive heart failure)  (HCC)   Thrombocytopenia (HCC)   BPH (benign prostatic hyperplasia)   Depression   Chronic kidney disease, stage 3a (HCC)   Dementia without behavioral disturbance (HCC)     Relevant past medical, surgical, family and social history reviewed and updated as indicated. Interim medical history since our last visit reviewed. Allergies and medications reviewed and updated. Outpatient Medications Prior to Visit  Medication Sig Dispense Refill   Cholecalciferol (VITAMIN D3) 1000 units CAPS Take 1 capsule (1,000 Units total) by mouth daily. 30 capsule    fexofenadine (ALLEGRA ALLERGY) 180 MG tablet Take 1 tablet (180 mg total) by mouth daily.     finasteride (PROSCAR) 5 MG tablet Take 1 tablet by mouth once daily 90 tablet 0   fludrocortisone (FLORINEF) 0.1 MG tablet Take 1 tablet (0.1 mg total) by mouth daily. 30 tablet 0   FLUoxetine (PROZAC) 20 MG capsule Take 1 capsule by mouth once daily 30 capsule 3   guaiFENesin (MUCINEX) 600 MG 12 hr tablet Take 1,200 mg by mouth daily.     midodrine (PROAMATINE) 10 MG tablet Take 1 tablet (10 mg total) by mouth 3 (three) times daily before meals. 270 tablet 3   Polyethylene Glycol 3350 (MIRALAX PO) Take 17 g by mouth daily.     Probiotic Product (ALIGN PO) Take by mouth daily.     Sennosides (SENOKOT PO) Take 1 tablet by mouth at bedtime.     vitamin B-12 (V-R VITAMIN B-12) 500 MCG tablet Take 1 tablet (500 mcg total) by mouth daily. (Patient taking differently:  Take 500 mcg by mouth at bedtime.)     No facility-administered medications prior to visit.     Per HPI unless specifically indicated in ROS section below Review of Systems  Objective:  BP 130/80   Pulse (!) 52   Temp 97.8 F (36.6 C) (Temporal)   Ht 6' (1.829 m)   Wt 182 lb 6 oz (82.7 kg)   SpO2 100%   BMI 24.73 kg/m   Wt Readings from Last 3 Encounters:  10/09/22 182 lb 6 oz (82.7 kg)  09/30/22 180 lb 12.4 oz (82 kg)  09/26/22 181 lb 9.6 oz (82.4 kg)      Physical Exam Vitals  and nursing note reviewed.  Constitutional:      Appearance: Normal appearance. He is not ill-appearing.  HENT:     Mouth/Throat:     Comments: Wearing mask Eyes:     Extraocular Movements: Extraocular movements intact.     Pupils: Pupils are equal, round, and reactive to light.  Cardiovascular:     Rate and Rhythm: Bradycardia present. Rhythm irregular.     Pulses: Normal pulses.     Heart sounds: Normal heart sounds. No murmur heard.    Comments: Zio patch in place Pulmonary:     Effort: Pulmonary effort is normal. No respiratory distress.     Breath sounds: Normal breath sounds. No wheezing, rhonchi or rales.  Musculoskeletal:     Right lower leg: No edema.     Left lower leg: No edema.  Skin:    General: Skin is warm and dry.     Findings: No rash.  Neurological:     Mental Status: He is alert.  Psychiatric:        Mood and Affect: Mood normal.        Behavior: Behavior normal.       Results for orders placed or performed during the hospital encounter of 09/30/22  CBC  Result Value Ref Range   WBC 7.9 4.0 - 10.5 K/uL   RBC 4.71 4.22 - 5.81 MIL/uL   Hemoglobin 15.0 13.0 - 17.0 g/dL   HCT 44.3 39.0 - 52.0 %   MCV 94.1 80.0 - 100.0 fL   MCH 31.8 26.0 - 34.0 pg   MCHC 33.9 30.0 - 36.0 g/dL   RDW 13.4 11.5 - 15.5 %   Platelets 118 (L) 150 - 400 K/uL   nRBC 0.0 0.0 - 0.2 %  Basic metabolic panel  Result Value Ref Range   Sodium 141 135 - 145 mmol/L   Potassium 4.5 3.5 - 5.1 mmol/L   Chloride 110 98 - 111 mmol/L   CO2 21 (L) 22 - 32 mmol/L   Glucose, Bld 122 (H) 70 - 99 mg/dL   BUN 14 8 - 23 mg/dL   Creatinine, Ser 1.24 0.61 - 1.24 mg/dL   Calcium 9.1 8.9 - 10.3 mg/dL   GFR, Estimated 57 (L) >60 mL/min   Anion gap 10 5 - 15  Protime-INR  Result Value Ref Range   Prothrombin Time 13.9 11.4 - 15.2 seconds   INR 1.1 0.8 - 1.2  Brain natriuretic peptide  Result Value Ref Range   B Natriuretic Peptide 471.9 (H) 0.0 - 100.0 pg/mL  Basic metabolic panel  Result  Value Ref Range   Sodium 141 135 - 145 mmol/L   Potassium 3.4 (L) 3.5 - 5.1 mmol/L   Chloride 111 98 - 111 mmol/L   CO2 25 22 - 32 mmol/L   Glucose, Bld  88 70 - 99 mg/dL   BUN 14 8 - 23 mg/dL   Creatinine, Ser 1.33 (H) 0.61 - 1.24 mg/dL   Calcium 8.3 (L) 8.9 - 10.3 mg/dL   GFR, Estimated 52 (L) >60 mL/min   Anion gap 5 5 - 15  Urinalysis, Complete w Microscopic  Result Value Ref Range   Color, Urine YELLOW (A) YELLOW   APPearance CLEAR (A) CLEAR   Specific Gravity, Urine 1.012 1.005 - 1.030   pH 6.0 5.0 - 8.0   Glucose, UA NEGATIVE NEGATIVE mg/dL   Hgb urine dipstick NEGATIVE NEGATIVE   Bilirubin Urine NEGATIVE NEGATIVE   Ketones, ur NEGATIVE NEGATIVE mg/dL   Protein, ur NEGATIVE NEGATIVE mg/dL   Nitrite NEGATIVE NEGATIVE   Leukocytes,Ua NEGATIVE NEGATIVE   WBC, UA 0-5 0 - 5 WBC/hpf   Bacteria, UA NONE SEEN NONE SEEN   Squamous Epithelial / LPF NONE SEEN 0 - 5   Mucus PRESENT   Troponin I (High Sensitivity)  Result Value Ref Range   Troponin I (High Sensitivity) 9 <18 ng/L    Assessment & Plan:   Problem List Items Addressed This Visit   None    No orders of the defined types were placed in this encounter.  No orders of the defined types were placed in this encounter.    Patient Instructions  Restart abdominal binder.  Continue current medicines Will await zio patch and electrophysiology report.   Follow up plan: No follow-ups on file.  [name], MD

## 2022-10-09 NOTE — Patient Instructions (Signed)
Restart abdominal binder.  Continue current medicines Will await zio patch and electrophysiology report.

## 2022-10-10 ENCOUNTER — Other Ambulatory Visit: Payer: Self-pay

## 2022-10-10 NOTE — Assessment & Plan Note (Signed)
Off AC due to fall risk.

## 2022-10-10 NOTE — Assessment & Plan Note (Signed)
Thought due to orthostatic hypotension, occurred shortly after stopping florinef. This has been restarted, along with midodrine TID.  He has Zio patch monitor in place.  Appreciate cards/EP care - upcoming appt.

## 2022-10-10 NOTE — Addendum Note (Signed)
Addended by: [name] on: 10/10/2022 08:46 PM   Modules accepted: Level of Service

## 2022-10-10 NOTE — Assessment & Plan Note (Addendum)
See above - continue florinef and midodrine.  Discussed dosing meds based on standing blood pressures.  He cannot tolerate even OTC TED hose compression.  Rec restart abdominal binder.

## 2022-10-17 ENCOUNTER — Ambulatory Visit: Payer: Medicare Other | Attending: Cardiology | Admitting: Cardiology

## 2022-10-17 ENCOUNTER — Encounter: Payer: Self-pay | Admitting: Cardiology

## 2022-10-17 ENCOUNTER — Telehealth: Payer: Self-pay

## 2022-10-17 VITALS — BP 136/62 | HR 51 | Ht 72.0 in | Wt 183.6 lb

## 2022-10-17 DIAGNOSIS — R55 Syncope and collapse: Secondary | ICD-10-CM | POA: Diagnosis not present

## 2022-10-17 NOTE — Progress Notes (Signed)
    Chronic Care Management Pharmacy Assistant   Name: Dylan [name]  MRN: [id number] DOB: December 02, 1933  Reason for Encounter: CCM (Appointment Reminder)  Medications: Outpatient Encounter Medications as of 10/17/2022  Medication Sig   Cholecalciferol (VITAMIN D3) 1000 units CAPS Take 1 capsule (1,000 Units total) by mouth daily.   fexofenadine (ALLEGRA ALLERGY) 180 MG tablet Take 1 tablet (180 mg total) by mouth daily.   finasteride (PROSCAR) 5 MG tablet Take 1 tablet by mouth once daily   fludrocortisone (FLORINEF) 0.1 MG tablet Take 1 tablet (0.1 mg total) by mouth daily.   FLUoxetine (PROZAC) 20 MG capsule Take 1 capsule by mouth once daily   guaiFENesin (MUCINEX) 600 MG 12 hr tablet Take 1,200 mg by mouth daily.   midodrine (PROAMATINE) 10 MG tablet Take 1 tablet (10 mg total) by mouth 3 (three) times daily before meals.   Polyethylene Glycol 3350 (MIRALAX PO) Take 17 g by mouth daily.   Probiotic Product (ALIGN PO) Take by mouth daily.   Sennosides (SENOKOT PO) Take 1 tablet by mouth at bedtime.   vitamin B-12 (V-R VITAMIN B-12) 500 MCG tablet Take 1 tablet (500 mcg total) by mouth daily. (Patient taking differently: Take 500 mcg by mouth at bedtime.)   No facility-administered encounter medications on file as of 10/17/2022.   Dylan [name] was contacted to remind of upcoming telephone visit with [name] on 10/22/2022 at 11:00. Patient was reminded to have any blood glucose and blood pressure readings available for review at appointment.   Message was left reminding patient of appointment.  CCM referral has been placed prior to visit?  No   Star Rating Drugs: Medication:  Last Fill: Day Supply No star rating drugs noted  [name], CPP notified  Marijean Niemann, [facility]acy Assistant [phone]

## 2022-10-17 NOTE — Progress Notes (Signed)
Electrophysiology Office Note   Date:  10/17/2022   ID:  Dylan [name], DOB 09/01/1934, MRN [id number]  PCP:  [name], MD  Cardiologist:  Bettina Gavia Primary Electrophysiologist:  Allegra Lai, MD  No chief complaint on file.     History of Present Illness: Dylan [name] is a 88 y.o. male who is being seen today for the evaluation of syncope at the request of Shirlee More. Presenting today for electrophysiology evaluation.  He has a ILR in place. Device is very near end of battery life. He has been hospitalized many times this past year. He was most recently hospitalized 11/20 after syncope that was different and longer than previous syncope episodes. Typically, patient I smore likely to syncopize first thing in the morning if getting up and out of bed too quickly. This episode was after he had been up and active for hours, after taking a shower while sitting with wife brushing hair. He presented to [facility], workup was largely unremarkable. Since his ILR is near EOS, a 2week zio was applied. Wife mailed back zio yesterday so results are not available.   Wife provides all of history.  Today, denies symptoms of palpitations, chest pain, shortness of breath, orthopnea, PND, lower extremity edema, claudication, dizziness, presyncope, syncope, bleeding, or neurologic sequela. The patient is tolerating medications without difficulties.  Overall he is doing well.  He has no complaints.  No chest pain or shortness of breath.     Past Medical History:  Diagnosis Date   Basal cell carcinoma 08/31/2020   Mid forehead - ED&C    Basal cell carcinoma of right nasal sidewall 12/15/2014   Bilateral hearing loss 08/03/2018   S/p audiology evaluation, has new hearing aides.   Bradycardia    a. 08/2018 ETT: Poor ex tolerance (2:10); b. 09/2018 s/p MDT Linq.   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 09/2018 s/p MDT Linq; b. 10/2021 Echo: EF 55-60%, no rwma, nl RV fxn, mild MR, triv AI.   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 30 capsule    fexofenadine (ALLEGRA ALLERGY) 180 MG tablet Take 1 tablet (180 mg total) by mouth daily.     finasteride (PROSCAR) 5 MG tablet Take 1 tablet by mouth once daily 90 tablet 0   fludrocortisone (FLORINEF) 0.1 MG tablet Take 1 tablet (0.1 mg total) by mouth daily. 30 tablet 0   FLUoxetine (PROZAC) 20 MG capsule Take 1 capsule by mouth once daily 30 capsule 3   guaiFENesin (MUCINEX) 600 MG 12 hr tablet Take 1,200 mg by mouth daily.     midodrine (PROAMATINE) 10 MG tablet Take 1 tablet (10 mg total) by mouth 3 (three) times daily before meals. 270 tablet 3   Polyethylene Glycol 3350 (MIRALAX PO) Take 17 g by mouth daily.     Probiotic Product (ALIGN PO) Take by mouth daily.     Sennosides (SENOKOT PO) Take 1 tablet by mouth at bedtime.     vitamin B-12 (V-R VITAMIN B-12) 500 MCG tablet Take 1 tablet (500 mcg total) by mouth daily. (Patient taking differently: Take 500 mcg by mouth at bedtime.)     No current facility-administered medications for this visit.    Allergies:   Doxepin, Seroquel [quetiapine], and Lincomycin hcl   Social History:  The patient  reports that he quit smoking about 61 years ago. His smoking use included cigarettes. He has never used smokeless tobacco. He reports that he does not drink alcohol and does not use drugs.   Family History:  The patient's family history includes Cataracts in his sister; Congestive Heart Failure in his father and mother; Leukemia in his brother; Stroke (age of onset: 10) in his father.   ROS:  Please see the history of present illness.   Otherwise, review of systems is positive for none.   All other systems are reviewed and negative.   PHYSICAL EXAM: VS:  BP 136/62   Pulse (!) 51   Ht 6' (1.829 m)   Wt 183 lb 9.6 oz (83.3 kg)    SpO2 95%   BMI 24.90 kg/m  , BMI Body mass index is 24.9 kg/m. GEN: Well nourished, well developed, in no acute distress  HEENT: normal  Neck: no JVD, carotid bruits, or masses Cardiac: RRR; no murmurs, rubs, or gallops,no edema  Respiratory:  clear to auscultation bilaterally, normal work of breathing GI: soft, nontender, nondistended, + BS MS: no deformity or atrophy  Skin: warm and dry, device site well healed Neuro:  Strength and sensation are intact Psych: hearing aids not in place, largely not-participatory in visit.   EKG:  EKG is not ordered today. Personal review of the ekg ordered 06/23/21 shows sinus rhythm, right bundle branch block, left anterior fascicular block  Personal review of the device interrogation today. Results in [facility]  No bradycardia episode correlates to syncope 09/30/21  Recent  Labs: 03/18/2022: TSH 0.753 04/22/2022: Magnesium 2.0 05/17/2022: ALT 15 09/30/2022: B Natriuretic Peptide 471.9; Hemoglobin 15.0; Platelets 118 10/01/2022: BUN 14; Creatinine, Ser 1.33; Potassium 3.4; Sodium 141    Lipid Panel     Component Value Date/Time   CHOL 168 04/22/2022 0600   TRIG 146 04/22/2022 0600   HDL 30 (L) 04/22/2022 0600   CHOLHDL 5.6 04/22/2022 0600   VLDL 29 04/22/2022 0600   LDLCALC 109 (H) 04/22/2022 0600   LDLDIRECT 140.0 08/20/2021 1117     Wt Readings from Last 3 Encounters:  10/17/22 183 lb 9.6 oz (83.3 kg)  10/09/22 182 lb 6 oz (82.7 kg)  09/30/22 180 lb 12.4 oz (82 kg)      Other studies Reviewed: Additional studies/ records that were reviewed today include: ZIO monitor 07/20/18 - personally reviewed Review of the above records today demonstrates:  297 runs of APCs longest lasting 11.2 seconds with a rate of 122 bpm the fastest 5 complexes a rate of 169 bpm.  There are no episodes of atrial fibrillation or flutter. There were no episodes of sinus node or AV nodal block There are no symptomatic events     ASSESSMENT AND PLAN:  1.   Syncope: Patient is status post Linq monitor.  He has a right bundle branch block but no obvious bradycardia that would cause syncope.  Notification settings on Linq raised from 30bpm --> 40bpm. Glory Graefe await zio results to determine next steps and whether ILR re-implantation is appropriate. Wife and patient are agreeable with this plan.   Current medicines are reviewed at length with the patient today.   The patient does not have concerns regarding his medicines.  The following changes were made today: None.  Labs/ tests ordered today include:  No orders of the defined types were placed in this encounter.    Disposition:   FU pending zio monitor results   Signed, Mamie Levers, NP  10/17/2022 5:32 PM     [place] [phone] (office) [phone] (fax)  I have seen and examined this patient with Mamie Levers.  Agree with above, note added to reflect my findings.  Patient is feeling well today.  He did have an episode of syncope.  He presented to the hospital.  Workup was unremarkable.  Review of ILR around the time of his episode showed no arrhythmias.  Today he feels well and is without complaint.  He is able to do all of his daily activities.  His history is provided by his wife due to dementia.  GEN: Well nourished, well developed, in no acute distress  HEENT: normal  Neck: no JVD, carotid bruits, or masses Cardiac: RRR; no murmurs, rubs, or gallops,no edema  Respiratory:  clear to auscultation bilaterally, normal work of breathing GI: soft, nontender, nondistended, + BS MS: no deformity or atrophy  Skin: warm and dry, device site well healed Neuro:  Strength and sensation are intact Psych: euthymic mood, full affect   Syncope: Unclear as to the cause.  He recently wore a cardiac monitor.  Makeba Delcastillo see if there are any bradycardia or tachyarrhythmias to explain his episodes.  If not, we Abbey Veith continue to monitor.   Follow-up to be determined by monitor results.  Jaelan Rasheed M. Welles Walthall MD 10/18/2022 9:45 AM

## 2022-10-18 ENCOUNTER — Encounter: Payer: Self-pay | Admitting: Cardiology

## 2022-10-22 ENCOUNTER — Ambulatory Visit: Payer: Medicare Other | Admitting: Pharmacist

## 2022-10-22 DIAGNOSIS — I951 Orthostatic hypotension: Secondary | ICD-10-CM

## 2022-10-22 DIAGNOSIS — E785 Hyperlipidemia, unspecified: Secondary | ICD-10-CM

## 2022-10-22 DIAGNOSIS — F03918 Unspecified dementia, unspecified severity, with other behavioral disturbance: Secondary | ICD-10-CM

## 2022-10-22 NOTE — Progress Notes (Signed)
Chronic Care Management Pharmacy Note  10/22/2022 Name:  Dylan [name] MRN:  [id number] DOB:  1934-03-12  Summary: F/U visit -Pt wife reports compliance with medications and denies side effects -Hx syncope: pt reports no further episodes since restarting Florinef. Discussed permissive hypertension given syncopal issues  Recommendations/Changes made from today's visit: -No med changes -Advised pt to contact cardiology or PCP if BP is > 160/100  Plan: -[place] will call patient in 2 months for BP check -Pharmacist follow up televisit scheduled for 6 months -PCP appt 01/21/23    Subjective: Dylan [name] is an 88 y.o. year old male who is a primary patient of [name], MD.  The CCM team was consulted for assistance with disease management and care coordination needs.    Engaged with patient by telephone for follow up visit in response to provider referral for pharmacy case management and/or care coordination services.   Consent to Services:  The patient was given information about Chronic Care Management services, agreed to services, and gave verbal consent prior to initiation of services.  Please see initial visit note for detailed documentation.   Patient Care Team: [name], MD as PCP - General (Family Medicine) Constance Haw, MD as PCP - Electrophysiology (Cardiology) Lorretta Harp, MD as PCP - Cardiology (Cardiology) [name], [facility] as Pharmacist (Pharmacist)  Patient has severe dementia and his wife Dylan [name] is his caregiver. 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   Recent office visits: 10/09/22 Dr Danise Mina OV: hospital f/u - discussed dosing florinef/midodrine on standing BP. Restart abdominal binder.  08/21/22 Dr Danise Mina OV: annual - confirmed DNR. On Zzquil with benefit.  Recent consult visits: 10/17/22 Dr Curt Bears  (Cardiology): f/u - await zio results. No changes.  09/26/22 Dr Barbarann Ehlers (Cardiology):f/u - orthostasis. D/c fludrocortisone.  07/02/22 Dr Leta Baptist (Neurology): inappropriate sexual behaviors - continue fluoxetine, trial quetiapine 12.5 mg  Hospital visits: 09/30/22 - 10/01/22 Admission [facility]): syncope. BP 92/50 per EMS. Restarted Florinef.  08/25/22 ED visit [facility] hypertension - SBP 200 at home. 126/61 in ED. Labs stable, discharged home.  Hospitalized 05/16/22 - 05/17/22 with observed bradycardia and low BPs. ? No symptoms.  Observed to have PVCs and this felt to be the cause of observed brady rates at home, florinef added to his regime. Cardiology consulted, no particular recommendations, agreed with florinef.  Hospitalized 04/16/22 - 04/23/22 after a syncopal event (x2), suspect orthostatic as major contributor, cardiology participated in his care, loop was interrogated with no HR/rhythm findings to explain syncope. He was treated with fluids, and midodrine increased. His Eliquis stopped with concerns of recurrent falls/syncope, traumatic bleeding, this to be re-addressed by primary cardiology team at follow up  He was reported to have HRs to 30's by pulse ox,BP cuff, felt to be 2/2 PVCs  Hospitalized 03/16/22 - 03/18/22 with c/o weakness, N/V/D, fever, found w/COVID    Objective:  Lab Results  Component Value Date   CREATININE 1.33 (H) 10/01/2022   BUN 14 10/01/2022   GFR 46.81 (L) 04/26/2022   GFRNONAA 52 (L) 10/01/2022   GFRAA >60 06/02/2020   NA 141 10/01/2022   K 3.4 (L) 10/01/2022   CALCIUM 8.3 (L) 10/01/2022   CO2 25 10/01/2022   GLUCOSE 88 10/01/2022    Lab Results  Component Value Date/Time   GFR 46.81 (L) 04/26/2022 01:12 PM   GFR  50.09 (L) 08/20/2021 11:17 AM   MICROALBUR 2.1 (H) 08/20/2021 11:22 AM    Last diabetic Eye exam: No results found for: "HMDIABEYEEXA"  Last diabetic Foot exam: No results found for: "HMDIABFOOTEX"   Lab Results  Component Value Date   CHOL  168 04/22/2022   HDL 30 (L) 04/22/2022   LDLCALC 109 (H) 04/22/2022   LDLDIRECT 140.0 08/20/2021   TRIG 146 04/22/2022   CHOLHDL 5.6 04/22/2022       Latest Ref Rng & Units 05/17/2022    6:49 AM 05/16/2022   12:58 PM 04/26/2022    1:12 PM  Hepatic Function  Total Protein 6.5 - 8.1 g/dL 6.1  6.3    Albumin 3.5 - 5.0 g/dL 3.6  3.5  4.2   AST 15 - 41 U/L 19  18    ALT 0 - 44 U/L 15  16    Alk Phosphatase 38 - 126 U/L 65  67    Total Bilirubin 0.3 - 1.2 mg/dL 1.1  1.0      Lab Results  Component Value Date/Time   TSH 0.753 03/18/2022 06:10 AM   TSH 1.45 08/09/2020 11:53 AM   TSH 1.56 02/22/2020 10:34 AM       Latest Ref Rng & Units 09/30/2022    8:35 AM 08/25/2022    2:42 PM 05/17/2022    6:49 AM  CBC  WBC 4.0 - 10.5 K/uL 7.9  7.5  5.7   Hemoglobin 13.0 - 17.0 g/dL 15.0  15.7  13.9   Hematocrit 39.0 - 52.0 % 44.3  46.9  40.8   Platelets 150 - 400 K/uL 118  124  80     Lab Results  Component Value Date/Time   VD25OH 54.44 08/20/2021 11:17 AM   VD25OH 45.75 08/09/2020 11:53 AM    Clinical ASCVD: Yes  The ASCVD Risk score (Arnett DK, et al., 2019) failed to calculate for the following reasons:   The 2019 ASCVD risk score is only valid for ages 44 to 15       08/20/2022   11:17 AM 08/20/2021   10:27 AM 08/16/2020   12:28 PM  Depression screen PHQ 2/9  Decreased Interest 0 0 0  Down, Depressed, Hopeless 0 0 0  PHQ - 2 Score 0 0 0  Altered sleeping 0    Tired, decreased energy 0    Change in appetite 0    Feeling bad or failure about yourself  0    Trouble concentrating 0    Moving slowly or fidgety/restless 0    Suicidal thoughts 0    PHQ-9 Score 0       Social History   Tobacco Use  Smoking Status Former   Types: Cigarettes   Quit date: 11/11/1960   Years since quitting: 62.0  Smokeless Tobacco Never   BP Readings from Last 3 Encounters:  10/17/22 136/62  10/09/22 130/80  10/01/22 (!) 148/93   Pulse Readings from Last 3 Encounters:  10/17/22 (!) 51   10/09/22 (!) 52  10/01/22 63   Wt Readings from Last 3 Encounters:  10/17/22 183 lb 9.6 oz (83.3 kg)  10/09/22 182 lb 6 oz (82.7 kg)  09/30/22 180 lb 12.4 oz (82 kg)   BMI Readings from Last 3 Encounters:  10/17/22 24.90 kg/m  10/09/22 24.73 kg/m  09/30/22 23.85 kg/m    Assessment/Interventions: Review of patient past medical history, allergies, medications, health status, including review of consultants reports, laboratory and other test data, was performed  as part of comprehensive evaluation and provision of chronic care management services.   SDOH:  (Social Determinants of Health) assessments and interventions performed: Yes SDOH Interventions    Flowsheet Row Chronic Care Management from 10/22/2022 in [place] at [facility] from 10/15/2021 in [place] at [place] Interventions Intervention Not Indicated --  Housing Interventions Intervention Not Indicated --  Transportation Interventions Intervention Not Indicated --  Utilities Interventions Intervention Not Indicated --  Financial Strain Interventions Intervention Not Indicated Intervention Not Indicated      [place]: No Food Insecurity (10/28/2022)  Housing: Low Risk  (10/28/2022)  Transportation Needs: No Transportation Needs (10/28/2022)  Utilities: Not At Risk (10/28/2022)  Depression (PHQ2-9): Low Risk  (08/20/2022)  Financial Resource Strain: Low Risk  (10/28/2022)  Tobacco Use: Medium Risk (10/18/2022)    CCM Care Plan  Allergies  Allergen Reactions   Doxepin Other (See Comments)    Lethargy, overly sedating at low dose   Seroquel [Quetiapine] Other (See Comments)    Worsened agitation, increased BP   Lincomycin Hcl Rash    Medications Reviewed Today     Reviewed by [name], [facility] (Pharmacist) on 10/22/22 at 1121  Med List Status: <None>   Medication Order Taking? Sig  Documenting Provider Last Dose Status Informant  Cholecalciferol (VITAMIN D3) 1000 units CAPS [id number] Yes Take 1 capsule (1,000 Units total) by mouth daily. [name], MD Taking Active Spouse/Significant Other           Med Note Nickolas Madrid   Thu Apr 25, 2022  2:56 PM)    fexofenadine [facility] ALLERGY) 180 MG tablet [id number] Yes Take 1 tablet (180 mg total) by mouth daily. [name], MD Taking Active Spouse/Significant Other  finasteride (PROSCAR) 5 MG tablet [id number] Yes Take 1 tablet by mouth once daily [name], MD Taking Active Spouse/Significant Other  fludrocortisone (FLORINEF) 0.1 MG tablet [id number] Yes Take 1 tablet (0.1 mg total) by mouth daily. Loletha Grayer, MD Taking Active   FLUoxetine (PROZAC) 20 MG capsule [id number] Yes Take 1 capsule by mouth once daily [name], MD Taking Active Spouse/Significant Other  guaiFENesin (MUCINEX) 600 MG 12 hr tablet [id number] Yes Take 1,200 mg by mouth daily. [provider] Taking Active Spouse/Significant Other  midodrine (PROAMATINE) 10 MG tablet [id number] Yes Take 1 tablet (10 mg total) by mouth 3 (three) times daily before meals. Baldwin [place], PA-C Taking Active Spouse/Significant Other  Polyethylene Glycol 3350 (MIRALAX PO) [id number] Yes Take 17 g by mouth daily. [provider] Taking Active Spouse/Significant Other           Med Note York Grice Apr 25, 2022  2:56 PM)    Probiotic Product (ALIGN PO) [id number] Yes Take by mouth daily. [provider] Taking Active Spouse/Significant Other  Sennosides (SENOKOT PO) [id number] Yes Take 1 tablet by mouth at bedtime. [provider] Taking Active Spouse/Significant Other           Med Note York Grice Apr 25, 2022  2:56 PM)    vitamin B-12 (V-R VITAMIN B-12) 500 MCG tablet [id number] Yes Take 1 tablet (500 mcg total) by mouth daily.  Patient taking differently: Take 500 mcg by mouth at  bedtime.   [name], MD Taking Active Spouse/Significant Other           Med Note [place], [place] R  Thu Apr 25, 2022  2:56 PM)              Patient Active Problem List   Diagnosis Date Noted   Syncope 09/30/2022   Depression 09/30/2022   Chronic diastolic CHF (congestive heart failure) ([place]) 09/30/2022   Bradycardia 09/30/2022   Chronic kidney disease, stage 3a ([place]) 04/16/2022   BPH (benign prostatic hyperplasia) 03/17/2022   DNR (do not resuscitate) 01/11/2022   Paroxysmal atrial flutter ([place]) 11/05/2021   Clostridium difficile colitis 06/04/2021   Left testicular pain 06/04/2021   Sacral pressure sore 06/04/2021   Symptomatic bradycardia 06/04/2021   Fecal impaction (HCC)    Constipation 05/04/2021   Proteinuria 05/04/2021   Orthostatic hypotension 05/04/2021   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   Sepsis with acute organ dysfunction ([place]) 05/30/2020   PAD (peripheral artery disease) ([place]) 03/21/2020   Right leg swelling 02/19/2020   Dysphagia 02/19/2020   Pruritus 02/19/2020   Medicare annual wellness visit, subsequent 08/11/2019   CKD (chronic kidney disease) stage 3, GFR 30-59 ml/min ([place]) 01/13/2019   Health maintenance examination 08/03/2018   Advanced care planning/counseling discussion 08/03/2018   Vitamin B12 deficiency 08/03/2018   Bilateral hearing loss 08/03/2018   Recurrent syncope 06/22/2018   Vitamin D deficiency 05/19/2018   Fall 05/19/2018   Dementia without behavioral disturbance ([place]) 05/19/2018   Cataract extraction status 03/06/2017   Obstruction of right lacrimal duct 10/16/2015   Exposure keratopathy, 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   Eversion of right lacrimal punctum 05/17/2015   Epiphora due to insufficient drainage of right side 05/17/2015   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   Basal cell carcinoma of right nasal sidewall 12/15/2014   Mohs defect of tear duct of right eye 12/15/2014   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  12/14/2014   Mohs defect of eyelid 12/14/2014   Thrombocytopenia ([place]) 02/21/2011   Dyslipidemia 02/04/2008   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   ERECTILE DYSFUNCTION, MILD 02/03/2007   Spermatocele of epididymis, single 02/03/2007    Immunization History  Administered Date(s) Administered   Fluad Quad(high Dose 65+) 08/11/2019, 08/16/2020, 08/20/2021, 08/21/2022   Influenza Whole 09/11/2002   PFIZER Comirnaty(Gray Top)Covid-19 Tri-Sucrose Vaccine 09/19/2022   PFIZER(Purple Top)SARS-COV-2 Vaccination 11/26/2019, 12/17/2019, 08/25/2020   PNEUMOCOCCAL CONJUGATE-20 08/21/2022   PPD Test 01/22/2022   Pfizer Covid-19 Vaccine Bivalent Booster 38yr & up 08/23/2021   Pneumococcal Polysaccharide-23 08/16/2020   Respiratory Syncytial Virus Vaccine,Recomb Aduvanted(Arexvy) 09/19/2022   Td 01/24/2005, 10/03/2010    Conditions to be addressed/monitored:  Hyperlipidemia, Atrial Fibrillation, Chronic Kidney Disease, and BPH, Dementia, Orthostatic hypotension  Care Plan : [place] Updates made by FCharlton Haws [place] 10/28/2022 12:00 AM     Problem: Hyperlipidemia, Atrial Fibrillation, and BPH, Dementia, Orthostatic hypotension   Priority: High     Long-Range Goal: Disease mgmt   Start Date: 10/15/2021  Expected End Date: 10/15/2022  This Visit's Progress: On track  Recent Progress: On track  Priority: High  Note:   Current Barriers:  None identified  Pharmacist Clinical Goal(s):  Patient will contact provider office for questions/concerns as evidenced notation of same in electronic health record through collaboration with PharmD and provider.   Interventions: 1:1 collaboration with GRia Bush MD regarding development and update of comprehensive plan of care as evidenced by provider attestation and co-signature Inter-disciplinary care team collaboration (see longitudinal plan of care) Comprehensive medication  review performed; medication list updated in electronic medical record  Hyperlipidemia: (LDL goal <  100) -Not ideally controlled - LDL 109 (04/2022) reasonable given dementia and comorbidities -Hx PAD. No statin recommended per PCP 08/2021 -Current treatment: None -Medications previously tried: n/a  -Educated on Cholesterol goals; Importance of limiting foods high in cholesterol; -Counseled on diet and exercise extensively  Atrial Flutter (Goal: prevent stroke and major bleeding) -Controlled - follows with cardiology regularly -CHADSVASC: 2; off [place] due to fall risk (previously on Eliquis) -Current treatment: None -Continue to monitor  Orthostatic hypotension (Goal: maintain BP 100/60-150/90) -Controlled - caregiver reports compliance with AM and HS doses of midodrine, does occasionally miss midday dose -Hx recurrent syncope; wife reports no further episodes of syncope since re-starting Florinef -Home BP readings: checks once daily in the bed -Current treatment  Midodrine 10 mg TID - Appropriate, Effective, Safe, Accessible Fludrocortisone 0.1 mg daily - Appropriate, Effective, Safe, Accessible -Counseled on BP goals; importance of hydration to maintain BP and prevent kidney impairment -Discussed permissive hypertension given hx of syncope; advised pt to contact PCP or cardiology if BP is persistently >160/100 -Recommended to continue current medication  Dementia w/ behavioral disturbance (Goal: slow progression) -Controlled - caregiver reports compliance with medications; she has noticed improvement in "inhibitions" since increasing sertraline to 100 mg -Pt has previously declined neurology evaluation -Current treatment  Fluoxetine 20 mg daily - Appropriate, Effective, Safe, Accessible -Medications previously tried: donepezil, memantine, quetiapine, sertraline -Counseled on benefits of memantine for slowing progression of disease; discussed she is unlikely to see benefit on  day-to-day basis -Pt is at elevated risk for bleeding given SSRI + Eliquis, discussed risks at length; benefits of sertraline outweigh risks currently as pt could not be in public before being on sertraline -Recommended to continue current medication  Urinary incontinence (Goal: manage symptoms) -Controlled - caregiver reports urinary habits are improved since starting finasteride -CT scan 04/2021 showed mild prostate enlargement -Current treatment  Finasteride 5 mg daily - Appropriate, Effective, Safe, Accessible -Counseled on enlarged prostate and benefits of finasteride -Recommended to continue current medication  Health Maintenance -Vaccine gaps: Prevnar, Shingrix, Covid booster, TDAP -Caregiver reports he had Covid booster 08/20/21 at pharmacy, same day as flu shot. Updated chart. -Hx recurrent Cdiff, chronic constipation - wife reports he has regular BM on current regimen  Patient Goals/Self-Care Activities Patient will:  - take medications as prescribed as evidenced by patient report and record review focus on medication adherence by pill box check blood pressure daily, document, and provide at future appointments       Medication Assistance: None required.  Patient affirms current coverage meets needs.  Compliance/Adherence/Medication fill history: Care Gaps: None  Star-Rating Drugs: None  Medication Access: Within the past 30 days, how often has patient missed a dose of medication? 0 Is a pillbox or other method used to improve adherence? Yes  Factors that may affect medication adherence? no barriers identified Are meds synced by current pharmacy? No  Are meds delivered by current pharmacy? No  Does patient experience delays in picking up medications due to transportation concerns? No   [facility] Services Reviewed: Is patient disadvantaged to use [facility]?: No  Current Rx insurance plan: [facility] Name and location of Current pharmacy:  [facility] [place] [place] [address] [place] [address] Phone: [phone] Fax: [phone]  [facility] services reviewed with patient today?: No  Patient requests to transfer care to [facility]?: No  Reason patient declined to change pharmacies: Not mentioned at this visit   Care Plan and Follow  Up Patient Decision:  Patient agrees to Care Plan and Follow-up.  Plan: Telephone follow up appointment with care management team member scheduled for:  6 months  [name], PharmD, BCACP Clinical Pharmacist [facility] at [facility] [phone]

## 2022-10-28 NOTE — Addendum Note (Signed)
Encounter addended by: Jeannette How on: 10/28/2022 9:09 AM  Actions taken: Imaging Exam ended

## 2022-10-28 NOTE — Patient Instructions (Signed)
Visit Information  Phone number for Pharmacist: [phone]   Goals Addressed   None     Care Plan : [name]  Updates made by [name], [name] since 10/28/2022 12:00 AM     Problem: Hyperlipidemia, Atrial Fibrillation, and BPH, Dementia, Orthostatic hypotension   Priority: High     Long-Range Goal: Disease mgmt   Start Date: 10/15/2021  Expected End Date: 10/15/2022  This Visit's Progress: On track  Recent Progress: On track  Priority: High  Note:   Current Barriers:  None identified  Pharmacist Clinical Goal(s):  Patient will contact provider office for questions/concerns as evidenced notation of same in electronic health record through collaboration with PharmD and provider.   Interventions: 1:1 collaboration with [name], MD regarding development and update of comprehensive plan of care as evidenced by provider attestation and co-signature Inter-disciplinary care team collaboration (see longitudinal plan of care) Comprehensive medication review performed; medication list updated in electronic medical record  Hyperlipidemia: (LDL goal < 100) -Not ideally controlled - LDL 109 (04/2022) reasonable given dementia and comorbidities -Hx PAD. No statin recommended per PCP 08/2021 -Current treatment: None -Medications previously tried: n/a  -Educated on Cholesterol goals; Importance of limiting foods high in cholesterol; -Counseled on diet and exercise extensively  Atrial Flutter (Goal: prevent stroke and major bleeding) -Controlled - follows with cardiology regularly -CHADSVASC: 2; off [name] due to fall risk (previously on Eliquis) -Current treatment: None -Continue to monitor  Orthostatic hypotension (Goal: maintain BP 100/60-150/90) -Controlled - caregiver reports compliance with AM and HS doses of midodrine, does occasionally miss midday dose -Hx recurrent syncope; wife reports no further episodes of syncope since re-starting  Florinef -Home BP readings: checks once daily in the bed -Current treatment  Midodrine 10 mg TID - Appropriate, Effective, Safe, Accessible Fludrocortisone 0.1 mg daily - Appropriate, Effective, Safe, Accessible -Counseled on BP goals; importance of hydration to maintain BP and prevent kidney impairment -Discussed permissive hypertension given hx of syncope; advised pt to contact PCP or cardiology if BP is persistently >160/100 -Recommended to continue current medication  Dementia w/ behavioral disturbance (Goal: slow progression) -Controlled - caregiver reports compliance with medications; she has noticed improvement in "inhibitions" since increasing sertraline to 100 mg -Pt has previously declined neurology evaluation -Current treatment  Fluoxetine 20 mg daily - Appropriate, Effective, Safe, Accessible -Medications previously tried: donepezil, memantine, quetiapine, sertraline -Counseled on benefits of memantine for slowing progression of disease; discussed she is unlikely to see benefit on day-to-day basis -Pt is at elevated risk for bleeding given SSRI + Eliquis, discussed risks at length; benefits of sertraline outweigh risks currently as pt could not be in public before being on sertraline -Recommended to continue current medication  Urinary incontinence (Goal: manage symptoms) -Controlled - caregiver reports urinary habits are improved since starting finasteride -CT scan 04/2021 showed mild prostate enlargement -Current treatment  Finasteride 5 mg daily - Appropriate, Effective, Safe, Accessible -Counseled on enlarged prostate and benefits of finasteride -Recommended to continue current medication  Health Maintenance -Vaccine gaps: Prevnar, Shingrix, Covid booster, TDAP -Caregiver reports he had Covid booster 08/20/21 at pharmacy, same day as flu shot. Updated chart. -Hx recurrent Cdiff, chronic constipation - wife reports he has regular BM on current regimen  Patient  Goals/Self-Care Activities Patient will:  - take medications as prescribed as evidenced by patient report and record review focus on medication adherence by pill box check blood pressure daily, document, and provide at future appointments      Patient  verbalizes understanding of instructions and care plan provided today and agrees to view in [name]. Active MyChart status and patient understanding of how to access instructions and care plan via MyChart confirmed with patient.    Telephone follow up appointment with pharmacy team member scheduled for: 6 months  [name], PharmD, [facility] Clinical Pharmacist [facility] [phone]

## 2022-11-20 ENCOUNTER — Ambulatory Visit (INDEPENDENT_AMBULATORY_CARE_PROVIDER_SITE_OTHER): Payer: Medicare Other | Admitting: Family Medicine

## 2022-11-20 ENCOUNTER — Telehealth: Payer: Self-pay

## 2022-11-20 ENCOUNTER — Encounter: Payer: Self-pay | Admitting: Family Medicine

## 2022-11-20 VITALS — BP 150/80 | HR 56 | Temp 97.2°F | Ht 72.0 in | Wt 180.1 lb

## 2022-11-20 DIAGNOSIS — F0390 Unspecified dementia without behavioral disturbance: Secondary | ICD-10-CM

## 2022-11-20 DIAGNOSIS — I471 Supraventricular tachycardia, unspecified: Secondary | ICD-10-CM | POA: Diagnosis not present

## 2022-11-20 DIAGNOSIS — I739 Peripheral vascular disease, unspecified: Secondary | ICD-10-CM | POA: Diagnosis not present

## 2022-11-20 DIAGNOSIS — R55 Syncope and collapse: Secondary | ICD-10-CM | POA: Diagnosis not present

## 2022-11-20 DIAGNOSIS — I4892 Unspecified atrial flutter: Secondary | ICD-10-CM | POA: Diagnosis not present

## 2022-11-20 DIAGNOSIS — I10 Essential (primary) hypertension: Secondary | ICD-10-CM | POA: Diagnosis not present

## 2022-11-20 DIAGNOSIS — I951 Orthostatic hypotension: Secondary | ICD-10-CM | POA: Diagnosis not present

## 2022-11-20 MED ORDER — MIDODRINE HCL 5 MG PO TABS: 5.0000 mg | ORAL_TABLET | Freq: Three times a day (TID) | ORAL | 6 refills | Status: DC

## 2022-11-20 NOTE — Telephone Encounter (Signed)
Seen today in office

## 2022-11-20 NOTE — Telephone Encounter (Signed)
Pts wife(DPR signed) said for 3 - 4 days BP has been elevated. Pt has appt with card on 11/28/22. Pt wife has tried to call cardiology x 2 different days and no one answers phone per pts wife. 11/19/22 BP 138/131 ? And 11/20/22 at 8:45 am BP 222/71 P 51. Pts wife said when pt is up walking she holds to pt and pt appears to have slight weakness in legs but pt rubs rt leg all the time for a long time. Occasionally pt seems to stumble backwards but has not fallen. Pts wife said pt has dementia and not sure if pt has H/A or dizziness, or CP. Pt does not complain or rub or hold chest. Pt does not appear SOB. No slurred speech. Pts wife wants [facility] at [facility]. Scheduled appt with Dr Darnell Level 11/20/22 at 10:30 with UC and ED precautions and pts wife voiced  understanding. Sending note to Dr Marguerita Beards pool and will teams [facility].

## 2022-11-20 NOTE — Patient Instructions (Signed)
Drop midodrine dose to '5mg'$  three times daily - new dose sent to pharmacy. If BP staying high despite this, cut fludrocortisone dose to 1/2 tab daily.  Keep appointment with cardiology next week.

## 2022-11-20 NOTE — Progress Notes (Unsigned)
Patient ID: Dylan [name], male    DOB: 12/12/1933, 89 y.o.   MRN: [id number]  This visit was conducted in person.  BP (!) 150/80 (BP Location: Right Arm, Cuff Size: Large)   Pulse (!) 56   Temp (!) 97.2 F (36.2 C) (Temporal)   Ht 6' (1.829 m)   Wt 180 lb 2 oz (81.7 kg)   SpO2 100%   BMI 24.43 kg/m    CC: elevated BP readings  Subjective:   HPI: Dylan [name] is a 89 y.o. male presenting on 11/20/2022 for Elevated Blood Pressure (C/o recent elevated BP readings- 11/19/22- 138/131 and today- 8:45 am BP 222/71. Pt also c/o Pt accompanied by wife, [name]. Pt has cards appt on 11/28/22. Also, c/o leg weakness. )   See prior notes for details.  Known orthostatic hypotension with syncope. He is on florinef 0.'1mg'$  daily as well as midodrine '10mg'$  TID.  He has been unable to tolerate TED hose or compression stockings. He's using abdominal binder on days he goes to adult daycare.   Recent elevated sitting BP readings - 138/131 yesterday, 222/77 this morning.  Wife states BP readings have been markedly fluctuating - worse high readings in the mornings.   Known PAF but off eliquis due to fall risk.  Upcoming cardiology appt next week as zio patch found frequent runs of SVT.      Relevant past medical, surgical, family and social history reviewed and updated as indicated. Interim medical history since our last visit reviewed. Allergies and medications reviewed and updated. Outpatient Medications Prior to Visit  Medication Sig Dispense Refill   Cholecalciferol (VITAMIN D3) 1000 units CAPS Take 1 capsule (1,000 Units total) by mouth daily. 30 capsule    fexofenadine (ALLEGRA ALLERGY) 180 MG tablet Take 1 tablet (180 mg total) by mouth daily.     finasteride (PROSCAR) 5 MG tablet Take 1 tablet by mouth once daily 90 tablet 0   fludrocortisone (FLORINEF) 0.1 MG tablet Take 1 tablet (0.1 mg total) by mouth daily. 30 tablet 0   FLUoxetine (PROZAC) 20 MG capsule Take 1 capsule by mouth once  daily 30 capsule 3   guaiFENesin (MUCINEX) 600 MG 12 hr tablet Take 1,200 mg by mouth daily.     Polyethylene Glycol 3350 (MIRALAX PO) Take 17 g by mouth daily.     Probiotic Product (ALIGN PO) Take by mouth daily.     Sennosides (SENOKOT PO) Take 1 tablet by mouth at bedtime.     vitamin B-12 (V-R VITAMIN B-12) 500 MCG tablet Take 1 tablet (500 mcg total) by mouth daily. (Patient taking differently: Take 500 mcg by mouth at bedtime.)     midodrine (PROAMATINE) 10 MG tablet Take 1 tablet (10 mg total) by mouth 3 (three) times daily before meals. 270 tablet 3   No facility-administered medications prior to visit.     Per HPI unless specifically indicated in ROS section below Review of Systems  Objective:  BP (!) 150/80 (BP Location: Right Arm, Cuff Size: Large)   Pulse (!) 56   Temp (!) 97.2 F (36.2 C) (Temporal)   Ht 6' (1.829 m)   Wt 180 lb 2 oz (81.7 kg)   SpO2 100%   BMI 24.43 kg/m   Wt Readings from Last 3 Encounters:  11/20/22 180 lb 2 oz (81.7 kg)  10/17/22 183 lb 9.6 oz (83.3 kg)  10/09/22 182 lb 6 oz (82.7 kg)      Physical Exam Vitals and  nursing note reviewed.  Constitutional:      Appearance: Normal appearance. He is not ill-appearing.  Eyes:     Extraocular Movements: Extraocular movements intact.     Pupils: Pupils are equal, round, and reactive to light.  Cardiovascular:     Rate and Rhythm: Normal rate and regular rhythm.     Pulses: Normal pulses.     Heart sounds: Normal heart sounds. No murmur heard. Pulmonary:     Effort: Pulmonary effort is normal. No respiratory distress.     Breath sounds: Normal breath sounds. No wheezing, rhonchi or rales.  Musculoskeletal:     Right lower leg: No edema.     Left lower leg: No edema.     Comments:  Wearing compression stocking to R leg No significant leg swelling  Skin:    General: Skin is warm and dry.     Findings: No rash.  Neurological:     Mental Status: He is alert.       Results for orders  placed or performed during the hospital encounter of 09/30/22  CBC  Result Value Ref Range   WBC 7.9 4.0 - 10.5 K/uL   RBC 4.71 4.22 - 5.81 MIL/uL   Hemoglobin 15.0 13.0 - 17.0 g/dL   HCT 44.3 39.0 - 52.0 %   MCV 94.1 80.0 - 100.0 fL   MCH 31.8 26.0 - 34.0 pg   MCHC 33.9 30.0 - 36.0 g/dL   RDW 13.4 11.5 - 15.5 %   Platelets 118 (L) 150 - 400 K/uL   nRBC 0.0 0.0 - 0.2 %  Basic metabolic panel  Result Value Ref Range   Sodium 141 135 - 145 mmol/L   Potassium 4.5 3.5 - 5.1 mmol/L   Chloride 110 98 - 111 mmol/L   CO2 21 (L) 22 - 32 mmol/L   Glucose, Bld 122 (H) 70 - 99 mg/dL   BUN 14 8 - 23 mg/dL   Creatinine, Ser 1.24 0.61 - 1.24 mg/dL   Calcium 9.1 8.9 - 10.3 mg/dL   GFR, Estimated 57 (L) >60 mL/min   Anion gap 10 5 - 15  Protime-INR  Result Value Ref Range   Prothrombin Time 13.9 11.4 - 15.2 seconds   INR 1.1 0.8 - 1.2  Brain natriuretic peptide  Result Value Ref Range   B Natriuretic Peptide 471.9 (H) 0.0 - 100.0 pg/mL  Basic metabolic panel  Result Value Ref Range   Sodium 141 135 - 145 mmol/L   Potassium 3.4 (L) 3.5 - 5.1 mmol/L   Chloride 111 98 - 111 mmol/L   CO2 25 22 - 32 mmol/L   Glucose, Bld 88 70 - 99 mg/dL   BUN 14 8 - 23 mg/dL   Creatinine, Ser 1.33 (H) 0.61 - 1.24 mg/dL   Calcium 8.3 (L) 8.9 - 10.3 mg/dL   GFR, Estimated 52 (L) >60 mL/min   Anion gap 5 5 - 15  Urinalysis, Complete w Microscopic  Result Value Ref Range   Color, Urine YELLOW (A) YELLOW   APPearance CLEAR (A) CLEAR   Specific Gravity, Urine 1.012 1.005 - 1.030   pH 6.0 5.0 - 8.0   Glucose, UA NEGATIVE NEGATIVE mg/dL   Hgb urine dipstick NEGATIVE NEGATIVE   Bilirubin Urine NEGATIVE NEGATIVE   Ketones, ur NEGATIVE NEGATIVE mg/dL   Protein, ur NEGATIVE NEGATIVE mg/dL   Nitrite NEGATIVE NEGATIVE   Leukocytes,Ua NEGATIVE NEGATIVE   WBC, UA 0-5 0 - 5 WBC/hpf   Bacteria, UA NONE  SEEN NONE SEEN   Squamous Epithelial / HPF NONE SEEN 0 - 5   Mucus PRESENT   Troponin I (High Sensitivity)   Result Value Ref Range   Troponin I (High Sensitivity) 9 <18 ng/L    Assessment & Plan:   There are no diagnoses linked to this encounter.   Patient Instructions  Drop midodrine dose to '5mg'$  three times daily - new dose sent to pharmacy. If BP staying high despite this, cut fludrocortisone dose to 1/2 tab daily.  Keep appointment with cardiology next week.    Follow up plan: Return if symptoms worsen or fail to improve.  [name], MD

## 2022-11-21 DIAGNOSIS — I471 Supraventricular tachycardia, unspecified: Secondary | ICD-10-CM | POA: Insufficient documentation

## 2022-11-21 NOTE — Assessment & Plan Note (Signed)
Chronically on florinef 0.'1mg'$  daily and midodrine '10mg'$  TID however now with rising blood pressures both supine and standing anticipate over-treatment. Will drop midodrine to '5mg'$  TID, with option to drop florinef to 0.'05mg'$  daily if needed. Wife agrees with plan - she will continue closely monitoring blood pressure readings at home.

## 2022-11-21 NOTE — Assessment & Plan Note (Signed)
Caution with compression stocking use.

## 2022-11-21 NOTE — Assessment & Plan Note (Signed)
This complicates care.

## 2022-11-21 NOTE — Assessment & Plan Note (Signed)
Frequent SVT by ZioPatch results - pending EP appt next week.

## 2022-11-21 NOTE — Assessment & Plan Note (Signed)
Off AC due to fall risk.

## 2022-11-28 ENCOUNTER — Encounter: Payer: Self-pay | Admitting: Cardiology

## 2022-11-28 ENCOUNTER — Ambulatory Visit: Payer: Medicare Other | Attending: Cardiology | Admitting: Cardiology

## 2022-11-28 VITALS — BP 110/76 | HR 79 | Ht 73.0 in | Wt 182.0 lb

## 2022-11-28 DIAGNOSIS — I493 Ventricular premature depolarization: Secondary | ICD-10-CM | POA: Diagnosis not present

## 2022-11-28 NOTE — Patient Instructions (Addendum)
Medication Instructions:  Your physician recommends that you continue on your current medications as directed. Please refer to the Current Medication list given to you today.  *If you need a refill on your cardiac medications before your next appointment, please call your pharmacy*   Lab Work: None ordered   Testing/Procedures: None ordered   Follow-Up: At [facility], you and your health needs are our priority.  As part of our continuing mission to provide you with exceptional heart care, we have created designated Provider Care Teams.  These Care Teams include your primary Cardiologist (physician) and Advanced Practice Providers (APPs -  Physician Assistants and Nurse Practitioners) who all work together to provide you with the care you need, when you need it.   Your next appointment:   To be  determined  The format for your next appointment:   In Person  Provider:   Allegra Lai, MD    Thank you for choosing [facility]!!   Trinidad Curet, RN [phone]  Other Instructions   Dr. Curt Bears wants to start Amiodarone for your PVCs.   You cannot take Prozac with this medication.  Please talk to the ordering provider about switching to: Effexor, Pristiq or Cymbalta.   Please let me know once you have switched to one of those medications and we will start the Amiodarone.    Amiodarone Tablets What is this medication? AMIODARONE (a MEE oh da rone) prevents and treats a fast or irregular heartbeat (arrhythmia). It works by slowing down overactive electric signals in the heart, which stabilizes your heart rhythm. It belongs to a group of medications called antiarrhythmics. This medicine may be used for other purposes; ask your health care provider or pharmacist if you have questions. COMMON BRAND NAME(S): Cordarone, Pacerone What should I tell my care team before I take this medication? They need to know if you have any of these conditions: Liver disease Lung  disease Other heart problems Thyroid disease An unusual or allergic reaction to amiodarone, iodine, other medications, foods, dyes, or preservatives Pregnant or trying to get pregnant Breast-feeding How should I use this medication? Take this medication by mouth with water. Take it as directed on the prescription label at the same time every day. You can take it with or without food. You should always take it the same way. Keep taking it unless your care team tells you to stop. A special MedGuide will be given to you by the pharmacist with each prescription and refill. Be sure to read this information carefully each time. Talk to your care team about the use of this medication in children. Special care may be needed. Overdosage: If you think you have taken too much of this medicine contact a poison control center or emergency room at once. NOTE: This medicine is only for you. Do not share this medicine with others. What if I miss a dose? If you miss a dose, take it as soon as you can. If it is almost time for your next dose, take only that dose. Do not take double or extra doses. What may interact with this medication? Do not take this medication with any of the following: Abarelix Apomorphine Arsenic trioxide Certain antibiotics, such as erythromycin, gemifloxacin, levofloxacin, or pentamidine Certain medications for depression, such as amoxapine or tricyclic antidepressants Certain medications for fungal infections, such as fluconazole, itraconazole, ketoconazole, posaconazole, or voriconazole Certain medications for irregular heartbeat, such as disopyramide, dronedarone, ibutilide, propafenone, or sotalol Certain medications for malaria, such as chloroquine  or halofantrine Cisapride Droperidol Haloperidol [place] Maprotiline Methadone Phenothiazines, such as chlorpromazine, mesoridazine, or thioridazine Pimozide Ranolazine Red yeast rice Vardenafil This medication may also  interact with the following: Antivirals for HIV Certain medications for blood pressure, heart disease, irregular heartbeat Certain medications for cholesterol, such as atorvastatin, cerivastatin, lovastatin, or simvastatin Certain medications for hepatitis C, such as sofosbuvir and ledipasvir; sofosbuvir Certain medications for seizures, such as phenytoin Certain medications for thyroid problems Certain medications that prevent or treat blood clots, such as warfarin Cholestyramine Cimetidine Clopidogrel Cyclosporine Dextromethorphan Diuretics Dofetilide Fentanyl General anesthetics Grapefruit juice Lidocaine Loratadine Methotrexate Other medications that cause heart rhythm changes Procainamide Quinidine Rifabutin, rifampin, or rifapentine St. John's Wort Trazodone Ziprasidone This list may not describe all possible interactions. Give your health care provider a list of all the medicines, herbs, non-prescription drugs, or dietary supplements you use. Also tell them if you smoke, drink alcohol, or use illegal drugs. Some items may interact with your medicine. What should I watch for while using this medication? Your condition will be monitored closely when you first begin therapy. This medication is often started in a hospital or other monitored health care setting. Once you are on maintenance therapy, visit your care team for regular checks on your progress. Because your condition and use of this medication carry some risk, it is a good idea to carry an identification card, necklace, or bracelet with details of your condition, medications, and care team. This medication may affect your coordination, reaction time, or judgment. Do not drive or operate machinery until you know how this medication affects you. Sit up or stand slowly to reduce the risk of dizzy or fainting spells. Drinking alcohol with this medication can increase the risk of these side effects. This medication can make  you more sensitive to the sun. Keep out of the sun. If you cannot avoid being in the sun, wear protective clothing and sunscreen. Do not use sun lamps, tanning beds, or tanning booths. You should have regular eye exams before and during treatment. Call your care team if you have blurred vision, see halos, or your eyes become sensitive to light. Your eyes may get dry. It may be helpful to use a lubricating eye solution or artificial tears solution. If you are going to have surgery or a procedure that requires contrast dyes, tell your care team that you are taking this medication. What side effects may I notice from receiving this medication? Side effects that you should report to your care team as soon as possible: Allergic reactions--skin rash, itching, hives, swelling of the face, lips, tongue, or throat Bluish-gray skin Change in vision such as blurry vision, seeing halos around lights, vision loss Heart failure--shortness of breath, swelling of the ankles, feet, or hands, sudden weight gain, unusual weakness or fatigue Heart rhythm changes--fast or irregular heartbeat, dizziness, feeling faint or lightheaded, chest pain, trouble breathing High thyroid levels (hyperthyroidism)--fast or irregular heartbeat, weight loss, excessive sweating or sensitivity to heat, tremors or shaking, anxiety, nervousness, irregular menstrual cycle or spotting Liver injury--right upper belly pain, loss of appetite, nausea, light-colored stool, dark yellow or brown urine, yellowing skin or eyes, unusual weakness or fatigue Low thyroid levels (hypothyroidism)--unusual weakness or fatigue, sensitivity to cold, constipation, hair loss, dry skin, weight gain, feelings of depression Lung injury--shortness of breath or trouble breathing, cough, spitting up blood, chest pain, fever Pain, tingling, or numbness in the hands or feet, muscle weakness, trouble walking, loss of balance or coordination Side effects that usually  do not  require medical attention (report to your care team if they continue or are bothersome): Nausea Vomiting This list may not describe all possible side effects. Call your doctor for medical advice about side effects. You may report side effects to FDA at 1-800-FDA-1088. Where should I keep my medication? Keep out of the reach of children and pets. Store at room temperature between 20 and 25 degrees C (68 and 77 degrees F). Protect from light. Keep container tightly closed. Throw away any unused medication after the expiration date. NOTE: This sheet is a summary. It may not cover all possible information. If you have questions about this medicine, talk to your doctor, pharmacist, or health care provider.  2023 Elsevier/Gold Standard (2020-12-22 00:00:00)

## 2022-11-28 NOTE — Progress Notes (Signed)
Electrophysiology Office Note   Date:  11/28/2022   ID:  Dylan [name], DOB [birth year], MRN [id number]  PCP:  [name], MD  Cardiologist:  Bettina Gavia Primary Electrophysiologist:  Allegra Lai, MD  No chief complaint on file.     History of Present Illness: Dylan [name] is a 89 y.o. male who is being seen today for the evaluation of syncope at the request of Shirlee More. Presenting today for electrophysiology evaluation.    He has a history significant for bradycardia, CKD stage III, hypertension, hyperlipidemia, recurrent syncope, right bundle branch block with left anterior fascicular block.  He has had an ILR in place that showed no cause for his syncope.  He continued to have episodes of syncope and wore a cardiac monitor that showed an elevated burden of PVCs.  The patient states that he has been feeling well, though his family states that he has been more fatigued and weak.  He also gets mildly short of breath.  Today, denies symptoms of palpitations, chest pain, orthopnea, PND, lower extremity edema, claudication, dizziness, presyncope, syncope, bleeding, or neurologic sequela. The patient is tolerating medications without difficulties.      Past Medical History:  Diagnosis Date   Basal cell carcinoma 08/31/2020   Mid forehead - ED&C    Basal cell carcinoma of right nasal sidewall 12/15/2014   Bilateral hearing loss 08/03/2018   S/p audiology evaluation, has new hearing aides.   Bradycardia    a. 08/2018 ETT: Poor ex tolerance (2:10); b. 09/2018 s/p MDT Linq.   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 09/2018 s/p MDT Linq; b. 10/2021 Echo: EF 55-60%, no rwma, nl RV fxn, mild MR, triv AI.   Thrombocytopenia ([place]) 02/21/2011   periph smear stable 02/2020   Vitamin B12 deficiency 08/03/2018   Completed b12 shots 09/2018, maintaining levels on oral replacement (dissolvable tablets) 01/2019   Vitamin D deficiency 05/19/2018   Past Surgical History:  Procedure Laterality Date   BASAL CELL CARCINOMA EXCISION Right 01/2015   with reconstructive surgery around eye   CATARACT EXTRACTION Left 03/2017   ETT  09/2018   negative for ischemia, extremely poor exercise tolerance ([place])   FOOT SURGERY Left 1998   metatarsal amputation after injury   KNEE ARTHROSCOPY  1993   Right   KNEE ARTHROSCOPY  01/2001   Left   LOOP RECORDER INSERTION N/A 09/30/2018   Procedure: LOOP RECORDER INSERTION;  Surgeon: Constance Haw, MD;  Location: [facility];  Service: Cardiovascular;  Laterality: N/A;     Current Outpatient Medications  Medication Sig Dispense Refill   Cholecalciferol (VITAMIN D3) 1000 units CAPS Take 1 capsule (1,000 Units total) by mouth daily. 30 capsule    fexofenadine (ALLEGRA ALLERGY) 180 MG tablet Take 1  tablet (180 mg total) by mouth daily.     finasteride (PROSCAR) 5 MG tablet Take 1 tablet by mouth once daily 90 tablet 0   fludrocortisone (FLORINEF) 0.1 MG tablet Take 1 tablet (0.1 mg total) by mouth daily. 30 tablet 0   FLUoxetine (PROZAC) 20 MG capsule Take 1 capsule by mouth once daily 30 capsule 3   guaiFENesin (MUCINEX) 600 MG 12 hr tablet Take 1,200 mg by mouth daily.     midodrine (PROAMATINE) 5 MG tablet Take 1 tablet (5 mg total) by mouth 3 (three) times daily with meals. 90 tablet 6   Polyethylene Glycol 3350 (MIRALAX PO) Take 17 g by mouth daily.     Probiotic Product (ALIGN PO) Take by mouth daily.     Sennosides (SENOKOT PO) Take 1 tablet by mouth at bedtime.     vitamin B-12 (V-R VITAMIN B-12) 500 MCG tablet Take 1 tablet (500 mcg total) by mouth daily. (Patient taking differently: Take 500 mcg by mouth at bedtime.)     No current facility-administered medications for this visit.    Allergies:   Doxepin, Seroquel [quetiapine], and Lincomycin hcl   Social History:  The patient  reports that he quit smoking about 62 years ago. His smoking use included cigarettes. He has never used smokeless tobacco. He reports that he does not drink alcohol and does not use drugs.   Family History:  The patient's family history includes Cataracts in his sister; Congestive Heart Failure in his father and mother; Leukemia in his brother; Stroke (age of onset: 43) in his father.   ROS:  Please see the history of present illness.   Otherwise, review of systems is positive for none.   All other systems are reviewed and negative.   PHYSICAL EXAM: VS:  BP 110/76   Pulse 79   Ht '6\' 1"'$  (1.854 m)   Wt 182 lb (82.6 kg)   SpO2 98%   BMI 24.01 kg/m  , BMI Body mass index is 24.01 kg/m. GEN: Well nourished, well developed, in no acute distress  HEENT: normal  Neck: no JVD, carotid bruits, or masses Cardiac: RRR; no murmurs, rubs, or gallops,no edema  Respiratory:  clear to auscultation  bilaterally, normal work of breathing GI: soft, nontender, nondistended, + BS MS: no deformity or atrophy  Skin: warm and dry, device site well healed Neuro:  Strength and sensation are intact Psych: hearing aids not in place, largely not-participatory in visit.   EKG:  EKG is not ordered today. Personal review of the ekg ordered 06/23/21 shows sinus rhythm, right bundle branch block, left anterior fascicular block  Personal review of the device interrogation today. Results in [facility]  No bradycardia episode correlates to syncope 09/30/21  Recent Labs: 03/18/2022: TSH 0.753 04/22/2022: Magnesium 2.0 05/17/2022: ALT 15 09/30/2022: B Natriuretic Peptide 471.9; Hemoglobin 15.0; Platelets 118 10/01/2022: BUN 14; Creatinine, Ser 1.33; Potassium 3.4; Sodium 141    Lipid Panel     Component Value Date/Time   CHOL 168 04/22/2022 0600   TRIG 146 04/22/2022  0600   HDL 30 (L) 04/22/2022 0600   CHOLHDL 5.6 04/22/2022 0600   VLDL 29 04/22/2022 0600   LDLCALC 109 (H) 04/22/2022 0600   LDLDIRECT 140.0 08/20/2021 1117     Wt Readings from Last 3 Encounters:  11/28/22 182 lb (82.6 kg)  11/20/22 180 lb 2 oz (81.7 kg)  10/17/22 183 lb 9.6 oz (83.3 kg)      Other studies Reviewed: Additional studies/ records that were reviewed today include: ZIO monitor 07/20/18 - personally reviewed Review of the above records today demonstrates:  297 runs of APCs longest lasting 11.2 seconds with a rate of 122 bpm the fastest 5 complexes a rate of 169 bpm.  There are no episodes of atrial fibrillation or flutter. There were no episodes of sinus node or AV nodal block There are no symptomatic events     ASSESSMENT AND PLAN:  1.  Syncope: Cause Continues to remain unclear.  No obvious cause for syncope on his cardiac monitor.  He did have a high burden of PVCs pill we Zaelyn Barbary reassess once PVCs are suppressed.  2.  PVCs: Significantly elevated burden at 29%.  He feels weak and fatigued.  He would benefit from  suppression therapy.  Due to his conduction system disease, and current medications are limited.  Due to that, we Makiyah Zentz plan to load him on amiodarone.  He is on Prozac.  This does limit amiodarone and he Blaire Palomino try an alternative medication for depression so his anxiety.  A list was given to the patient for him to discuss with his primary physician.   Current medicines are reviewed at length with the patient today.   The patient does not have concerns regarding his medicines.  The following changes were made today: None  Labs/ tests ordered today include:  Orders Placed This Encounter  Procedures   EKG 12-Lead     Disposition:   FU 3 results   Signed, Nayda Riesen [name], MD  11/28/2022 1:57 PM     [address] [place] [address] [phone] (office) [phone] (fax)

## 2022-12-08 ENCOUNTER — Other Ambulatory Visit: Payer: Self-pay | Admitting: Family Medicine

## 2022-12-08 DIAGNOSIS — N400 Enlarged prostate without lower urinary tract symptoms: Secondary | ICD-10-CM

## 2022-12-10 ENCOUNTER — Ambulatory Visit (INDEPENDENT_AMBULATORY_CARE_PROVIDER_SITE_OTHER): Payer: Medicare Other | Admitting: Family Medicine

## 2022-12-10 ENCOUNTER — Telehealth: Payer: Self-pay | Admitting: Cardiology

## 2022-12-10 ENCOUNTER — Encounter: Payer: Self-pay | Admitting: Family Medicine

## 2022-12-10 VITALS — BP 126/78 | HR 56 | Temp 97.7°F | Ht 73.0 in | Wt 180.4 lb

## 2022-12-10 DIAGNOSIS — F32A Depression, unspecified: Secondary | ICD-10-CM

## 2022-12-10 DIAGNOSIS — I493 Ventricular premature depolarization: Secondary | ICD-10-CM | POA: Diagnosis not present

## 2022-12-10 DIAGNOSIS — F0390 Unspecified dementia without behavioral disturbance: Secondary | ICD-10-CM

## 2022-12-10 DIAGNOSIS — F66 Other sexual disorders: Secondary | ICD-10-CM

## 2022-12-10 MED ORDER — DULOXETINE HCL 30 MG PO CPEP: 30.0000 mg | ORAL_CAPSULE | Freq: Every day | ORAL | 3 refills | Status: DC

## 2022-12-10 NOTE — Telephone Encounter (Signed)
Wife aware forwarding to MD for advisement of Amiodarone loading instructions. Aware will follow up by end of week/beginning of next. Wife agreeable to plan.

## 2022-12-10 NOTE — Telephone Encounter (Signed)
Pt c/o medication issue:  1. Name of Medication: Amiodarone   2. How are you currently taking this medication (dosage and times per day)? Not currently taking   3. Are you having a reaction (difficulty breathing--STAT)? No   4. What is your medication issue? Patient's wife is calling to speak with Venida Jarvis stating they will be able to start this medication on 02/07 so they would like the prescription sent to the pharmacy

## 2022-12-10 NOTE — Progress Notes (Signed)
Patient ID: Dylan [name], male    DOB: 11/29/1933, 89 y.o.   MRN: [id number]  This visit was conducted in person.  BP 126/78   Pulse (!) 56   Temp 97.7 F (36.5 C) (Temporal)   Ht '6\' 1"'$  (1.854 m)   Wt 180 lb 6 oz (81.8 kg)   SpO2 98%   BMI 23.80 kg/m    CC: discuss meds  Subjective:   HPI: Dylan [name] is a 89 y.o. male presenting on 12/10/2022 for Disccuss Medication (Per pt's wife, Dylan [name] today's OV], Dr. Curt Bears [cards] wants to start pt on Amiodarone due to PVCs. So Dr. Curt Bears suggests that Prozac be changed to Effexor, Pristiq or Cymbalta due to interaction.     )   High PVC burden followed by EP - has been recommended to start amiodarone suppression treatment. Desires to switch from prozac to different medication. 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   They are recommending SNRI in place of SSRI.      Relevant past medical, surgical, family and social history reviewed and updated as indicated. Interim medical history since our last visit reviewed. Allergies and medications reviewed and updated. Outpatient Medications Prior to Visit  Medication Sig Dispense Refill   Cholecalciferol (VITAMIN D3) 1000 units CAPS Take 1 capsule (1,000 Units total) by mouth daily. 30 capsule    fexofenadine (ALLEGRA ALLERGY) 180 MG tablet Take 1 tablet (180 mg total) by mouth daily.     finasteride (PROSCAR) 5 MG tablet Take 1 tablet by mouth once daily 90 tablet 3   fludrocortisone (FLORINEF) 0.1 MG tablet Take 1 tablet (0.1 mg total) by mouth daily. 30 tablet 0   guaiFENesin (MUCINEX) 600 MG 12 hr tablet Take 1,200 mg by mouth daily.     midodrine (PROAMATINE) 5 MG tablet Take 1 tablet (5 mg total) by mouth 3 (three) times daily with meals. 90 tablet 6   Polyethylene Glycol 3350 (MIRALAX PO) Take 17 g by mouth daily.     Probiotic Product (ALIGN PO) Take by mouth daily.     Sennosides (SENOKOT PO) Take 1 tablet by mouth at bedtime.     vitamin B-12 (V-R  VITAMIN B-12) 500 MCG tablet Take 1 tablet (500 mcg total) by mouth daily. (Patient taking differently: Take 500 mcg by mouth at bedtime.)     FLUoxetine (PROZAC) 20 MG capsule Take 1 capsule by mouth once daily 30 capsule 3   No facility-administered medications prior to visit.     Per HPI unless specifically indicated in ROS section below Review of Systems  Objective:  BP 126/78   Pulse (!) 56   Temp 97.7 F (36.5 C) (Temporal)   Ht '6\' 1"'$  (1.854 m)   Wt 180 lb 6 oz (81.8 kg)   SpO2 98%   BMI 23.80 kg/m   Wt Readings from Last 3 Encounters:  12/10/22 180 lb 6 oz (81.8 kg)  11/28/22 182 lb (82.6 kg)  11/20/22 180 lb 2 oz (81.7 kg)      Physical Exam Vitals and nursing note reviewed.  Constitutional:      Appearance: Normal appearance. He is not ill-appearing.  Neurological:     Mental Status: He is alert.  Psychiatric:        Mood and Affect: Mood normal.        Behavior: Behavior normal.       Results for orders placed or performed during the hospital encounter of 09/30/22  CBC  Result Value Ref Range   WBC 7.9 4.0 - 10.5 K/uL   RBC 4.71 4.22 - 5.81 MIL/uL   Hemoglobin 15.0 13.0 - 17.0 g/dL   HCT 44.3 39.0 - 52.0 %   MCV 94.1 80.0 - 100.0 fL   MCH 31.8 26.0 - 34.0 pg   MCHC 33.9 30.0 - 36.0 g/dL   RDW 13.4 11.5 - 15.5 %   Platelets 118 (L) 150 - 400 K/uL   nRBC 0.0 0.0 - 0.2 %  Basic metabolic panel  Result Value Ref Range   Sodium 141 135 - 145 mmol/L   Potassium 4.5 3.5 - 5.1 mmol/L   Chloride 110 98 - 111 mmol/L   CO2 21 (L) 22 - 32 mmol/L   Glucose, Bld 122 (H) 70 - 99 mg/dL   BUN 14 8 - 23 mg/dL   Creatinine, Ser 1.24 0.61 - 1.24 mg/dL   Calcium 9.1 8.9 - 10.3 mg/dL   GFR, Estimated 57 (L) >60 mL/min   Anion gap 10 5 - 15  Protime-INR  Result Value Ref Range   Prothrombin Time 13.9 11.4 - 15.2 seconds   INR 1.1 0.8 - 1.2  Brain natriuretic peptide  Result Value Ref Range   B Natriuretic Peptide 471.9 (H) 0.0 - 100.0 pg/mL  Basic metabolic  panel  Result Value Ref Range   Sodium 141 135 - 145 mmol/L   Potassium 3.4 (L) 3.5 - 5.1 mmol/L   Chloride 111 98 - 111 mmol/L   CO2 25 22 - 32 mmol/L   Glucose, Bld 88 70 - 99 mg/dL   BUN 14 8 - 23 mg/dL   Creatinine, Ser 1.33 (H) 0.61 - 1.24 mg/dL   Calcium 8.3 (L) 8.9 - 10.3 mg/dL   GFR, Estimated 52 (L) >60 mL/min   Anion gap 5 5 - 15  Urinalysis, Complete w Microscopic  Result Value Ref Range   Color, Urine YELLOW (A) YELLOW   APPearance CLEAR (A) CLEAR   Specific Gravity, Urine 1.012 1.005 - 1.030   pH 6.0 5.0 - 8.0   Glucose, UA NEGATIVE NEGATIVE mg/dL   Hgb urine dipstick NEGATIVE NEGATIVE   Bilirubin Urine NEGATIVE NEGATIVE   Ketones, ur NEGATIVE NEGATIVE mg/dL   Protein, ur NEGATIVE NEGATIVE mg/dL   Nitrite NEGATIVE NEGATIVE   Leukocytes,Ua NEGATIVE NEGATIVE   WBC, UA 0-5 0 - 5 WBC/hpf   Bacteria, UA NONE SEEN NONE SEEN   Squamous Epithelial / HPF NONE SEEN 0 - 5   Mucus PRESENT   Troponin I (High Sensitivity)  Result Value Ref Range   Troponin I (High Sensitivity) 9 <18 ng/L    Assessment & Plan:   Problem List Items Addressed This Visit     Dementia without behavioral disturbance (HCC)   Relevant Medications   DULoxetine (CYMBALTA) 30 MG capsule   Sexual disinhibition - Primary    Need to come off SSRI due to amiodarone - QT prolonging interaction.  Cardiology recommends SNRI.  Limited in other options including TCAs, trazodone, remeron, atypical antipsychotics due to same (QT prolongation). Stop prozac - prozac should taper off on its own given prolonged half life. Will start cymbalta '30mg'$  daily 3 days after prozac stopped.  Rec start amiodarone 1 wk after stopping prozac.  Update with effect.       Depression    See below re antidepressant changes      Relevant Medications   DULoxetine (CYMBALTA) 30 MG capsule   Frequent PVCs  Frequent symptomatic PVCs. EP recommends starting amiodarone - see below re med changes.         Meds ordered  this encounter  Medications   DULoxetine (CYMBALTA) 30 MG capsule    Sig: Take 1 capsule (30 mg total) by mouth daily.    Dispense:  30 capsule    Refill:  3    To replace prozac    No orders of the defined types were placed in this encounter.   Patient Instructions  Stop prozac. After 3 days start cymbalta '30mg'$  daily. This will be new mood medication. Let us know how you tolerate this medicine. You could start amiodarone 1 week after stopping prozac.   Follow up plan: No follow-ups on file.  [name], MD

## 2022-12-10 NOTE — Assessment & Plan Note (Signed)
See below re antidepressant changes

## 2022-12-10 NOTE — Assessment & Plan Note (Signed)
Frequent symptomatic PVCs. EP recommends starting amiodarone - see below re med changes.

## 2022-12-10 NOTE — Assessment & Plan Note (Signed)
Need to come off SSRI due to amiodarone - QT prolonging interaction.  Cardiology recommends SNRI.  Limited in other options including TCAs, trazodone, remeron, atypical antipsychotics due to same (QT prolongation). Stop prozac - prozac should taper off on its own given prolonged half life. Will start cymbalta '30mg'$  daily 3 days after prozac stopped.  Rec start amiodarone 1 wk after stopping prozac.  Update with effect.

## 2022-12-10 NOTE — Patient Instructions (Addendum)
Stop prozac. After 3 days start cymbalta '30mg'$  daily. This will be new mood medication. Let us know how you tolerate this medicine. You could start amiodarone 1 week after stopping prozac.

## 2022-12-12 ENCOUNTER — Telehealth: Payer: Self-pay

## 2022-12-12 MED ORDER — AMIODARONE HCL 200 MG PO TABS: ORAL_TABLET | ORAL | 1 refills | Status: DC

## 2022-12-12 NOTE — Telephone Encounter (Signed)
Wife informed Dr. Curt Bears recommends starting Amiodarone 200 mg BID x 1 month, then reducing to 200 mg ONCE daily. Rx send to pharmacy. Advised to call if SE occur after staring the medication. Wife agreeable to plan.

## 2022-12-12 NOTE — Progress Notes (Signed)
Care Management & Coordination Services Pharmacy Team  Reason for Encounter: Hypertension   Contacted patient to discuss hypertension disease state.  Attempted contact with patient 3 times. Unsuccessful outreach. Patient has follow up visit with PCP next month.   Current antihypertensive regimen:  Florinef 0.1 mg daily - 1 tablet by mouth daily. Midodrine 5 mg - 1 tablet by mouth 3 times daily with meals  Adherence Review: Is the patient currently on ACE/ARB medication? No Does the patient have >5 day gap between last estimated fill dates? N/A  Star Rating Drugs:  Medication:  Last Fill: Day Supply No Star Rating Drugs noted  Chart Updates: Recent office visits:  12/11/2022 [name], MD Sexual Disinhibition Start: Duloxetine HCI 30 mg Stop: Fluoxetine 20 mg 11/20/22 [name], MD Orthostatic Hypotension Change: Midodrine 5 mg vs. 10 mg. Change: If BP staying high, cut fludrocortisone dose to 1/2 tab daily.   Recent consult visits:  01/18//2024 Allegra Lai, MD (Cardiology) Ordered: EKG F/U 3 months  Hospital visits:  None since last contact  Medications: Outpatient Encounter Medications as of 12/12/2022  Medication Sig   Cholecalciferol (VITAMIN D3) 1000 units CAPS Take 1 capsule (1,000 Units total) by mouth daily.   DULoxetine (CYMBALTA) 30 MG capsule Take 1 capsule (30 mg total) by mouth daily.   fexofenadine (ALLEGRA ALLERGY) 180 MG tablet Take 1 tablet (180 mg total) by mouth daily.   finasteride (PROSCAR) 5 MG tablet Take 1 tablet by mouth once daily   fludrocortisone (FLORINEF) 0.1 MG tablet Take 1 tablet (0.1 mg total) by mouth daily.   guaiFENesin (MUCINEX) 600 MG 12 hr tablet Take 1,200 mg by mouth daily.   midodrine (PROAMATINE) 5 MG tablet Take 1 tablet (5 mg total) by mouth 3 (three) times daily with meals.   Polyethylene Glycol 3350 (MIRALAX PO) Take 17 g by mouth daily.   Probiotic Product (ALIGN PO) Take by mouth daily.   Sennosides (SENOKOT PO)  Take 1 tablet by mouth at bedtime.   vitamin B-12 (V-R VITAMIN B-12) 500 MCG tablet Take 1 tablet (500 mcg total) by mouth daily. (Patient taking differently: Take 500 mcg by mouth at bedtime.)   No facility-administered encounter medications on file as of 12/12/2022.   Recent Office Vitals: BP Readings from Last 3 Encounters:  12/10/22 126/78  11/28/22 110/76  11/20/22 (!) 150/80   Pulse Readings from Last 3 Encounters:  12/10/22 (!) 56  11/28/22 79  11/20/22 (!) 56    Wt Readings from Last 3 Encounters:  12/10/22 180 lb 6 oz (81.8 kg)  11/28/22 182 lb (82.6 kg)  11/20/22 180 lb 2 oz (81.7 kg)    Kidney Function Lab Results  Component Value Date/Time   CREATININE 1.33 (H) 10/01/2022 04:50 AM   CREATININE 1.24 09/30/2022 08:35 AM   GFR 46.81 (L) 04/26/2022 01:12 PM   GFRNONAA 52 (L) 10/01/2022 04:50 AM   GFRAA >60 06/02/2020 04:33 AM      Latest Ref Rng & Units 10/01/2022    4:50 AM 09/30/2022    8:35 AM 08/25/2022    2:42 PM  BMP  Glucose 70 - 99 mg/dL 88  122  102   BUN 8 - 23 mg/dL 14  14  19   $ Creatinine 0.61 - 1.24 mg/dL 1.33  1.24  1.43   Sodium 135 - 145 mmol/L 141  141  143   Potassium 3.5 - 5.1 mmol/L 3.4  4.5  4.5   Chloride 98 - 111 mmol/L 111  110  109   CO2 22 - 32 mmol/L 25  21  27   $ Calcium 8.9 - 10.3 mg/dL 8.3  9.1  9.0    [name], PharmD notified  Marijean Niemann, [place] [facility] Assistant [phone]

## 2022-12-24 ENCOUNTER — Telehealth: Payer: Self-pay | Admitting: Cardiology

## 2022-12-24 NOTE — Telephone Encounter (Signed)
  Per MyChart message:  Pt c/o medication issue:  1. Name of Medication: amiodarone (PACERONE) 200 MG tablet   2. How are you currently taking this medication (dosage and times per day)? Take 1 tablet (200 mg total) TWICE a day for one month, then take 1 tablet ONCE daily thereafter.   3. Are you having a reaction (difficulty breathing--STAT)?    4. What is your medication issue?  Sluggish, hard time concentrating, not sleeping, right now he has picked up a stomach bug which has wiped him out. I don't know if it's from the new medicine or not.

## 2022-12-24 NOTE — Telephone Encounter (Signed)
Spoke to the patient wife, her husband is symptomatic. He has vomited on Sunday, several times on Monday, and has not vomited today. Patient does have dementia, he was sleeping more until the medication change. Wife also stated she is not sure if he has a stomach bug, because she also vomited last night. She wanted to know if this is a side effect of amiodarone. Denies any other symptoms, has not contact PCP pertaining to the symptoms. MD and nurse are not in the office. Will forward to Pharm D.

## 2022-12-25 NOTE — Telephone Encounter (Signed)
Spoke to wife. Advised to decrease to once daily. She is agreeable and will relay message to pt. They will call back if no improvement.

## 2022-12-25 NOTE — Telephone Encounter (Signed)
Followed up w/ wife. Pt feels more like himself this morning, according to wife [facility]), but he has only had one dose of Amiodarone so far today. Aware I will forward to MD for advisement. Informed that we will most  likely decrease it to once daily to see if symptoms improve. Wife is agreeable to plan. Aware I will call her today/tomorrow with advisement as MD is not in the office today. She will have pt hold this evenings dose of Amiodarone until hearing back from our office. She is agreeable to plan.

## 2023-01-21 ENCOUNTER — Encounter: Payer: Medicare Other | Admitting: Psychology

## 2023-01-21 ENCOUNTER — Encounter: Payer: Self-pay | Admitting: Family Medicine

## 2023-01-21 ENCOUNTER — Ambulatory Visit (INDEPENDENT_AMBULATORY_CARE_PROVIDER_SITE_OTHER): Payer: Medicare Other | Admitting: Family Medicine

## 2023-01-21 VITALS — BP 134/70 | HR 55 | Temp 97.2°F | Ht 73.0 in | Wt 177.4 lb

## 2023-01-21 DIAGNOSIS — F66 Other sexual disorders: Secondary | ICD-10-CM

## 2023-01-21 DIAGNOSIS — I493 Ventricular premature depolarization: Secondary | ICD-10-CM | POA: Diagnosis not present

## 2023-01-21 DIAGNOSIS — F0390 Unspecified dementia without behavioral disturbance: Secondary | ICD-10-CM

## 2023-01-21 DIAGNOSIS — I471 Supraventricular tachycardia, unspecified: Secondary | ICD-10-CM | POA: Diagnosis not present

## 2023-01-21 DIAGNOSIS — R55 Syncope and collapse: Secondary | ICD-10-CM | POA: Diagnosis not present

## 2023-01-21 MED ORDER — DULOXETINE HCL 40 MG PO CPEP: 40.0000 mg | ORAL_CAPSULE | Freq: Every day | ORAL | 3 refills | Status: DC

## 2023-01-21 NOTE — Patient Instructions (Addendum)
Increase cymbalta to '40mg'$  daily - new dose sent to pharmacy.  Update [place] with effect. If no benefit, will discuss prozac again with Dr Curt Bears.  Good to see you today.

## 2023-01-21 NOTE — Progress Notes (Unsigned)
Patient ID: Dylan [name], male    DOB: 1934-10-17, 89 y.o.   MRN: [id number]  This visit was conducted in person.  BP 134/70   Pulse (!) 55   Temp (!) 97.2 F (36.2 C) (Temporal)   Ht '6\' 1"'$  (1.854 m)   Wt 177 lb 6 oz (80.5 kg)   SpO2 97%   BMI 23.40 kg/m   BP Readings from Last 3 Encounters:  01/21/23 134/70  12/10/22 126/78  11/28/22 110/76    CC: 4 mo f/u visit  Subjective:   HPI: Dylan [name] is a 89 y.o. male presenting on 01/21/2023 for Medical Management of Chronic Issues (Here for 4 mo f/u. Pt accompanied by wife, Dylan [name]. )   See prior note for details. Seen here 12/10/2022 - at that time we changed antidepressant used for sexual disinhibition from SSIR to SNRI as he was to start amiodarone treatment and there was concern for QT prolongation. Limited in other options including TCAs, trazodone, remeron, atypical antipsychotics due to same (QT prolongation).   We started cymbalta '30mg'$  daily - wife notes worsening agitation, restlessness, recurrent sexual disinhibitions. This also in setting of renovations and other changes to routine - that has since resolved. Appetite good.   Using new bed at home - had trouble getting accustomed to this.  New later bedtime at 10pm (instead of 9pm) this has also helped some.   He took amiodarone '200mg'$  BID for about a week then changed to once daily - it may have caused nausea/vomiting and insomnia. He continues taking amiodarone '200mg'$  once daily. No recent syncopal episodes. BP and pulse staying well controlled.      Relevant past medical, surgical, family and social history reviewed and updated as indicated. Interim medical history since our last visit reviewed. Allergies and medications reviewed and updated. Outpatient Medications Prior to Visit  Medication Sig Dispense Refill   amiodarone (PACERONE) 200 MG tablet Take 1 tablet (200 mg total) TWICE a day for one month, then take 1 tablet ONCE daily thereafter. 90 tablet 1    Cholecalciferol (VITAMIN D3) 1000 units CAPS Take 1 capsule (1,000 Units total) by mouth daily. 30 capsule    fexofenadine (ALLEGRA ALLERGY) 180 MG tablet Take 1 tablet (180 mg total) by mouth daily.     finasteride (PROSCAR) 5 MG tablet Take 1 tablet by mouth once daily 90 tablet 3   fludrocortisone (FLORINEF) 0.1 MG tablet Take 1 tablet (0.1 mg total) by mouth daily. 30 tablet 0   guaiFENesin (MUCINEX) 600 MG 12 hr tablet Take 1,200 mg by mouth daily.     midodrine (PROAMATINE) 5 MG tablet Take 1 tablet (5 mg total) by mouth 3 (three) times daily with meals. 90 tablet 6   Polyethylene Glycol 3350 (MIRALAX PO) Take 17 g by mouth daily.     Probiotic Product (ALIGN PO) Take by mouth daily.     Sennosides (SENOKOT PO) Take 1 tablet by mouth at bedtime.     vitamin B-12 (V-R VITAMIN B-12) 500 MCG tablet Take 1 tablet (500 mcg total) by mouth daily. (Patient taking differently: Take 500 mcg by mouth at bedtime.)     DULoxetine (CYMBALTA) 30 MG capsule Take 1 capsule (30 mg total) by mouth daily. 30 capsule 3   No facility-administered medications prior to visit.     Per HPI unless specifically indicated in ROS section below Review of Systems  Objective:  BP 134/70   Pulse (!) 55   Temp (!)  97.2 F (36.2 C) (Temporal)   Ht '6\' 1"'$  (1.854 m)   Wt 177 lb 6 oz (80.5 kg)   SpO2 97%   BMI 23.40 kg/m   Wt Readings from Last 3 Encounters:  01/21/23 177 lb 6 oz (80.5 kg)  12/10/22 180 lb 6 oz (81.8 kg)  11/28/22 182 lb (82.6 kg)      Physical Exam Vitals and nursing note reviewed.  Constitutional:      Appearance: Normal appearance. He is not ill-appearing.  Cardiovascular:     Rate and Rhythm: Normal rate and regular rhythm. Frequent Extrasystoles are present.    Pulses: Normal pulses.     Heart sounds: Normal heart sounds. No murmur heard. Pulmonary:     Effort: Pulmonary effort is normal. No respiratory distress.     Breath sounds: Normal breath sounds. No wheezing, rhonchi or rales.   Musculoskeletal:     Right lower leg: No edema.     Left lower leg: No edema.  Skin:    General: Skin is warm and dry.     Findings: No rash.  Neurological:     Mental Status: He is alert.  Psychiatric:        Mood and Affect: Mood normal.        Behavior: Behavior normal.        Assessment & Plan:   Problem List Items Addressed This Visit     Dementia without behavioral disturbance ([place])    Thought bvFTD, r/o Alzheimer's disease.  This complicates care.      Relevant Medications   DULoxetine 40 MG CPEP   Sexual disinhibition - Primary    Worse with transition from SSRI (prozac) to SNRI (duloxetine).  Will try increasing duloxetine to '40mg'$  daily.  Discussed possible retrial of prozac if ok by cardiology despite amiodarone interaction and possible QT prolongation. I think he should tolerate prozac better than other agents more likely to cause QT prolongation such as TCAs, trazodone, remeron, atypical antipsychotics.      Syncope    No syncope in the past month since starting amiodarone.       SVT (supraventricular tachycardia)    Amiodarone seems effective for h/o SVT and frequent PVCs      Frequent PVCs     Meds ordered this encounter  Medications   DULoxetine 40 MG CPEP    Sig: Take 1 capsule (40 mg total) by mouth daily.    Dispense:  30 capsule    Refill:  3    Note new dose    No orders of the defined types were placed in this encounter.   Patient Instructions  Increase cymbalta to '40mg'$  daily - new dose sent to pharmacy.  Update [place] with effect. If no benefit, will discuss prozac again with Dr Curt Bears.  Good to see you today.   Follow up plan: Return in about 3 months (around 04/23/2023) for follow up visit.  [name], MD

## 2023-01-22 ENCOUNTER — Encounter: Payer: Self-pay | Admitting: Family Medicine

## 2023-01-22 NOTE — Assessment & Plan Note (Signed)
No syncope in the past month since starting amiodarone.

## 2023-01-22 NOTE — Assessment & Plan Note (Addendum)
Worse with transition from SSRI (prozac) to SNRI (duloxetine).  Will try increasing duloxetine to '40mg'$  daily.  Discussed possible retrial of prozac if ok by cardiology despite amiodarone interaction and possible QT prolongation. I think he should tolerate prozac better than other agents more likely to cause QT prolongation such as TCAs, trazodone, remeron, atypical antipsychotics.

## 2023-01-22 NOTE — Assessment & Plan Note (Signed)
Amiodarone seems effective for h/o SVT and frequent PVCs

## 2023-01-22 NOTE — Assessment & Plan Note (Signed)
Thought bvFTD, r/o Alzheimer's disease.  This complicates care.

## 2023-01-24 ENCOUNTER — Telehealth: Payer: Self-pay | Admitting: Family Medicine

## 2023-01-24 NOTE — Telephone Encounter (Signed)
Patient's wife Romel Gibas dropped off document  [employer] Applicant Medical Information Form , to be filled out by provider. Patient requested to send it via Call Patient to pick up within ASAP. Document is located in providers tray at front office.Please advise at Mobile [phone] (mobile).

## 2023-01-24 NOTE — Telephone Encounter (Signed)
Placed form in Dr. G's box.  

## 2023-01-27 NOTE — Telephone Encounter (Signed)
Form placed and in Lisa's box. Last PPD was 01/29/2022. He may need this updated.

## 2023-01-27 NOTE — Telephone Encounter (Signed)
Patient's wife notified as instructed by telephone and verbalized understanding. Patient's wife was advised that the form is up front ready for pickup.

## 2023-01-28 ENCOUNTER — Encounter: Payer: Medicare Other | Admitting: Psychology

## 2023-01-30 ENCOUNTER — Telehealth: Payer: Self-pay | Admitting: Cardiology

## 2023-01-30 NOTE — Telephone Encounter (Signed)
Pt c/o medication issue:  1. Name of Medication:   DULoxetine 40 MG CPEP   2. How are you currently taking this medication (dosage and times per day)? As prescribed  3. Are you having a reaction (difficulty breathing--STAT)?   4. What is your medication issue?   Wife states they received a letter from [employer] company that they are no longer covering this medication.  Wife wants a call back from RN Sherri regarding next steps.

## 2023-01-31 ENCOUNTER — Telehealth: Payer: Self-pay | Admitting: Family Medicine

## 2023-01-31 MED ORDER — DULOXETINE HCL 20 MG PO CPEP: 40.0000 mg | ORAL_CAPSULE | Freq: Every day | ORAL | 3 refills | Status: DC

## 2023-01-31 NOTE — Telephone Encounter (Signed)
Placed letter in Dr. G's box.  

## 2023-01-31 NOTE — Telephone Encounter (Signed)
Pt wife came in office to drop off paperwork for PCP to review stated the insurance will not cover prescription pt  need's an alternative . Place in PCP folder

## 2023-01-31 NOTE — Telephone Encounter (Signed)
Wife informed that we do not prescribe/refill antidepressant medication. She thought it might me one of his heart medications. She will call PCP to discuss. She applicates my return call.

## 2023-01-31 NOTE — Telephone Encounter (Signed)
Spoke with pt's wife, [name] (on dpr), relaying Dr. Synthia Innocent message. She verbalizes understanding.

## 2023-01-31 NOTE — Addendum Note (Signed)
Addended by: [name] on: 01/31/2023 02:03 PM   Modules accepted: Orders

## 2023-01-31 NOTE — Telephone Encounter (Addendum)
40mg  capsules not covered, but 20, 30, 60mg  is.  Will see if insurance covers duloxetine 20mg  2 cap daily (for total 40mg ).  I've sent in new dose.

## 2023-02-20 ENCOUNTER — Ambulatory Visit: Payer: Medicare Other | Attending: Cardiology | Admitting: Cardiology

## 2023-02-20 ENCOUNTER — Encounter: Payer: Self-pay | Admitting: Cardiology

## 2023-02-20 VITALS — BP 136/80 | HR 57 | Ht 73.0 in | Wt 177.0 lb

## 2023-02-20 DIAGNOSIS — I493 Ventricular premature depolarization: Secondary | ICD-10-CM | POA: Diagnosis not present

## 2023-02-20 DIAGNOSIS — Z79899 Other long term (current) drug therapy: Secondary | ICD-10-CM | POA: Diagnosis not present

## 2023-02-20 DIAGNOSIS — R55 Syncope and collapse: Secondary | ICD-10-CM | POA: Diagnosis not present

## 2023-02-20 NOTE — Patient Instructions (Signed)
Medication Instructions:  Your physician recommends that you continue on your current medications as directed. Please refer to the Current Medication list given to you today.  *If you need a refill on your cardiac medications before your next appointment, please call your pharmacy*   Lab Work: Today, Amiodarone surveillance labs: TSH & LFTs  If you have labs (blood work) drawn today and your tests are completely normal, you will receive your results only by: MyChart Message (if you have MyChart) OR A paper copy in the mail If you have any lab test that is abnormal or we need to change your treatment, we will call you to review the results.   Testing/Procedures: None ordered   Follow-Up: At [facility], you and your health needs are our priority.  As part of our continuing mission to provide you with exceptional heart care, we have created designated Provider Care Teams.  These Care Teams include your primary Cardiologist (physician) and Advanced Practice Providers (APPs -  Physician Assistants and Nurse Practitioners) who all work together to provide you with the care you need, when you need it.  Your next appointment:   6 month(s)  The format for your next appointment:   In Person  Provider:   Loman Brooklyn, MD    Thank you for choosing [facility]!!   Dory Horn, RN [phone]

## 2023-02-20 NOTE — Progress Notes (Signed)
Electrophysiology Office Note   Date:  02/20/2023   ID:  Dylan [name], DOB [birth year], MRN [id number]  PCP:  Eustaquio Boyden, MD  Cardiologist:  Dulce Sellar Primary Electrophysiologist:  Loman Brooklyn, MD  No chief complaint on file.     History of Present Illness: Dylan [name] is a 89 y.o. male who is being seen today for the evaluation of syncope at the request of Dylan [name]. Presenting today for electrophysiology evaluation.    He has a history significant for bradycardia, CKD stage III, hypertension, hyperlipidemia, recurrent syncope, right bundle branch block, left anterior fascicular block.  He has an ILR in place that showed no cause for his syncope.  He is continue to have episodes of syncope and wore cardiac monitor that showed an elevated PVC burden.  He is now on amiodarone.  Today, denies symptoms of palpitations, chest pain, shortness of breath, orthopnea, PND, lower extremity edema, claudication, dizziness, presyncope, syncope, bleeding, or neurologic sequela. The patient is tolerating medications without difficulties.  Since being seen he has been put on amiodarone.  His caregiver feels that he has been doing better on amiodarone and is more awake and alert.  She has noticed elevated blood pressures in the mornings, but they are improved as the day goes on and after his medications.    Past Medical History:  Diagnosis Date   Basal cell carcinoma 08/31/2020   Mid forehead - ED&C    Basal cell carcinoma of right nasal sidewall 12/15/2014   Bilateral hearing loss 08/03/2018   S/p audiology evaluation, has new hearing aides.   Bradycardia    a. 08/2018 ETT: Poor ex tolerance (2:10); b. 09/2018 s/p MDT Linq.   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 09/2018 s/p MDT Linq; b. 10/2021 Echo: EF 55-60%, no rwma, nl RV fxn, mild MR, triv AI.   Thrombocytopenia 02/21/2011   periph smear stable 02/2020   Vitamin B12 deficiency 08/03/2018   Completed b12 shots 09/2018, maintaining levels on oral replacement (dissolvable tablets) 01/2019   Vitamin D deficiency 05/19/2018   Past Surgical History:  Procedure Laterality Date   BASAL CELL CARCINOMA EXCISION Right 01/2015   with reconstructive surgery around eye   CATARACT EXTRACTION Left 03/2017   ETT  09/2018   negative for ischemia, extremely poor exercise tolerance ([place])   FOOT SURGERY Left 1998  metatarsal amputation after injury   KNEE ARTHROSCOPY  1993   Right   KNEE ARTHROSCOPY  01/2001   Left   LOOP RECORDER INSERTION N/A 09/30/2018   Procedure: LOOP RECORDER INSERTION;  Surgeon: Regan Lemming, MD;  Location: [facility];  Service: Cardiovascular;  Laterality: N/A;     Current Outpatient Medications  Medication Sig Dispense Refill   amiodarone (PACERONE) 200 MG tablet Take 1 tablet (200  mg total) TWICE a day for one month, then take 1 tablet ONCE daily thereafter. 90 tablet 1   Cholecalciferol (VITAMIN D3) 1000 units CAPS Take 1 capsule (1,000 Units total) by mouth daily. 30 capsule    DULoxetine (CYMBALTA) 20 MG capsule Take 2 capsules (40 mg total) by mouth daily. 60 capsule 3   fexofenadine (ALLEGRA ALLERGY) 180 MG tablet Take 1 tablet (180 mg total) by mouth daily.     finasteride (PROSCAR) 5 MG tablet Take 1 tablet by mouth once daily 90 tablet 3   fludrocortisone (FLORINEF) 0.1 MG tablet Take 1 tablet (0.1 mg total) by mouth daily. 30 tablet 0   guaiFENesin (MUCINEX) 600 MG 12 hr tablet Take 1,200 mg by mouth daily.     midodrine (PROAMATINE) 5 MG tablet Take 1 tablet (5 mg total) by mouth 3 (three) times daily with meals. 90 tablet 6   Polyethylene Glycol 3350 (MIRALAX PO) Take 17 g by mouth daily.     Probiotic Product (ALIGN PO) Take by mouth daily.     Sennosides (SENOKOT PO) Take 1 tablet by mouth at bedtime.     vitamin B-12 (V-R VITAMIN B-12) 500 MCG tablet Take 1 tablet (500 mcg total) by mouth daily. (Patient taking differently: Take 500 mcg by mouth at bedtime.)     No current facility-administered medications for this visit.    Allergies:   Doxepin, Seroquel [quetiapine], and Lincomycin hcl   Social History:  The patient  reports that he quit smoking about 62 years ago. His smoking use included cigarettes. He has never used smokeless tobacco. He reports that he does not drink alcohol and does not use drugs.   Family History:  The patient's family history includes Cataracts in his sister; Congestive Heart Failure in his father and mother; Leukemia in his brother; Stroke (age of onset: 45) in his father.   ROS:  Please see the history of present illness.   Otherwise, review of systems is positive for none.   All other systems are reviewed and negative.   PHYSICAL EXAM: VS:  BP 136/80   Pulse (!) 57   Ht 6\' 1"  (1.854 m)   Wt 177 lb (80.3 kg)   SpO2 98%    BMI 23.35 kg/m  , BMI Body mass index is 23.35 kg/m. GEN: Well nourished, well developed, in no acute distress  HEENT: normal  Neck: no JVD, carotid bruits, or masses Cardiac: RRR; no murmurs, rubs, or gallops,no edema  Respiratory:  clear to auscultation bilaterally, normal work of breathing GI: soft, nontender, nondistended, + BS MS: no deformity or atrophy  Skin: warm and dry, device site well healed Neuro:  Strength and sensation are intact Psych: euthymic mood, full affect  EKG:  EKG is ordered today. Personal review of the ekg ordered shows sinus rhythm, right bundle branch, left anterior fascicular block  Personal review of the device interrogation today. Results in [facility]   Recent Labs: 03/18/2022: TSH 0.753 04/22/2022: Magnesium 2.0 05/17/2022: ALT 15 09/30/2022: B Natriuretic Peptide 471.9; Hemoglobin 15.0; Platelets  118 10/01/2022: BUN 14; Creatinine, Ser 1.33; Potassium 3.4; Sodium 141    Lipid Panel     Component Value Date/Time   CHOL 168 04/22/2022 0600   TRIG 146 04/22/2022 0600   HDL 30 (L) 04/22/2022 0600   CHOLHDL 5.6 04/22/2022 0600   VLDL 29 04/22/2022 0600   LDLCALC 109 (H) 04/22/2022 0600   LDLDIRECT 140.0 08/20/2021 1117     Wt Readings from Last 3 Encounters:  02/20/23 177 lb (80.3 kg)  01/21/23 177 lb 6 oz (80.5 kg)  12/10/22 180 lb 6 oz (81.8 kg)      Other studies Reviewed: Additional studies/ records that were reviewed today include: ZIO monitor 07/20/18 - personally reviewed Review of the above records today demonstrates:  297 runs of APCs longest lasting 11.2 seconds with a rate of 122 bpm the fastest 5 complexes a rate of 169 bpm.  There are no episodes of atrial fibrillation or flutter. There were no episodes of sinus node or AV nodal block There are no symptomatic events     ASSESSMENT AND PLAN:  1.  Syncope: Cause remains unclear.  Has a high burden of PVCs.  Now on amiodarone.  No further episodes of syncope.  2.  PVCs: Burden  of 29%.  Currently on amiodarone.  No PVCs on EKG or rhythm strip performed today.  He is feeling improved.  Jasper Ruminski continue with current management.  3.  High risk medication monitoring: Currently on amiodarone.  Krystle Oberman check LFTs and TSH today.   Current medicines are reviewed at length with the patient today.   The patient does not have concerns regarding his medicines.  The following changes were made today: None  Labs/ tests ordered today include:  Orders Placed This Encounter  Procedures   TSH   Hepatic function panel   EKG 12-Lead     Disposition:   FU 6 results   Signed, Brodee Mauritz Jorja LoaMartin Delle Andrzejewski, MD  02/20/2023 2:09 PM     [facility] [address] [place] [address] [phone] (office) [phone] (fax)

## 2023-02-21 LAB — HEPATIC FUNCTION PANEL
ALT: 42 IU/L (ref 0–44)
AST: 21 IU/L (ref 0–40)
Albumin: 4.5 g/dL (ref 3.7–4.7)
Alkaline Phosphatase: 106 IU/L (ref 44–121)
Bilirubin Total: 0.6 mg/dL (ref 0.0–1.2)
Bilirubin, Direct: 0.17 mg/dL (ref 0.00–0.40)
Total Protein: 6.9 g/dL (ref 6.0–8.5)

## 2023-02-21 LAB — TSH: TSH: 1.9 u[IU]/mL (ref 0.450–4.500)

## 2023-02-25 DIAGNOSIS — H04123 Dry eye syndrome of bilateral lacrimal glands: Secondary | ICD-10-CM | POA: Diagnosis not present

## 2023-02-25 DIAGNOSIS — H5212 Myopia, left eye: Secondary | ICD-10-CM | POA: Diagnosis not present

## 2023-02-25 DIAGNOSIS — H0288A Meibomian gland dysfunction right eye, upper and lower eyelids: Secondary | ICD-10-CM | POA: Diagnosis not present

## 2023-02-25 DIAGNOSIS — Z9842 Cataract extraction status, left eye: Secondary | ICD-10-CM | POA: Diagnosis not present

## 2023-02-25 DIAGNOSIS — H52223 Regular astigmatism, bilateral: Secondary | ICD-10-CM | POA: Diagnosis not present

## 2023-02-25 DIAGNOSIS — Z9841 Cataract extraction status, right eye: Secondary | ICD-10-CM | POA: Diagnosis not present

## 2023-02-25 DIAGNOSIS — H0288B Meibomian gland dysfunction left eye, upper and lower eyelids: Secondary | ICD-10-CM | POA: Diagnosis not present

## 2023-03-18 ENCOUNTER — Encounter: Payer: Self-pay | Admitting: Family Medicine

## 2023-03-18 ENCOUNTER — Ambulatory Visit (INDEPENDENT_AMBULATORY_CARE_PROVIDER_SITE_OTHER): Payer: Medicare Other | Admitting: Family Medicine

## 2023-03-18 VITALS — BP 120/68 | HR 56 | Temp 97.0°F | Ht 73.0 in | Wt 178.1 lb

## 2023-03-18 DIAGNOSIS — I4892 Unspecified atrial flutter: Secondary | ICD-10-CM | POA: Diagnosis not present

## 2023-03-18 DIAGNOSIS — M79605 Pain in left leg: Secondary | ICD-10-CM

## 2023-03-18 DIAGNOSIS — F03918 Unspecified dementia, unspecified severity, with other behavioral disturbance: Secondary | ICD-10-CM

## 2023-03-18 DIAGNOSIS — R55 Syncope and collapse: Secondary | ICD-10-CM

## 2023-03-18 DIAGNOSIS — I739 Peripheral vascular disease, unspecified: Secondary | ICD-10-CM | POA: Diagnosis not present

## 2023-03-18 DIAGNOSIS — G8929 Other chronic pain: Secondary | ICD-10-CM | POA: Insufficient documentation

## 2023-03-18 DIAGNOSIS — M79604 Pain in right leg: Secondary | ICD-10-CM | POA: Diagnosis not present

## 2023-03-18 MED ORDER — ATORVASTATIN CALCIUM 20 MG PO TABS: 20.0000 mg | ORAL_TABLET | Freq: Every day | ORAL | 3 refills | Status: DC

## 2023-03-18 MED ORDER — ASPIRIN 81 MG PO TBEC: 81.0000 mg | DELAYED_RELEASE_TABLET | Freq: Every day | ORAL | Status: DC

## 2023-03-18 NOTE — Assessment & Plan Note (Signed)
Presumed bvFTD given frontal lobe predominance on imaging.

## 2023-03-18 NOTE — Assessment & Plan Note (Signed)
H/o parox aflutter with high PVC burden.  Previously off AC due to fall risk.  Now on amiodarone

## 2023-03-18 NOTE — Progress Notes (Signed)
Ph: [phone]       Fax: [phone]   Patient ID: Dylan [name], male    DOB: [birth year], 89 y.o.   MRN: [id number]  This visit was conducted in person.  BP 120/68   Pulse (!) 56   Temp (!) 97 F (36.1 C) (Temporal)   Ht 6\' 1"  (1.854 m)   Wt 178 lb 2 oz (80.8 kg)   SpO2 95%   BMI 23.50 kg/m    CC: R>L leg pain, chronic  Subjective:   HPI: Dylan [name] is a 89 y.o. male presenting on 03/18/2023 for Leg Pain (C/o B leg pain- worse in R, off and on for yrs. Compression stockings were recommended- pt refuses. Pt accompanied by wife, [name].)   Years of chronic R>L leg pain in known PAD as per below.  He does not want to wear sport leggings his wife bought.  History limited due to mod-severe dementia.  Known severe orthostasis managed with florinef 0.1mg  daily and midodrin 5mg  TID with meals.  Known h/o bradycardia, CKD, HTN, HLD, recurrent syncope, RBBB with LAFB with ILR in place, unrevealing. On amiodarone 200mg  daily for high PVCs burden.   Over the past 3-4 weeks wife has noted he's been rubbing hands over R leg from thigh down to lower leg. Also he's become less ambulatory.   Appetite has been fine.  No long car rides, plane rides. No personal h/o blood clots.   H/o bilateral arterial vascular ultrasound with ABIs 03/2020 - R: mild-mod medial calcification throughout lower extremity, 50-74% stenosis noted to R TP trunk. R ABI 0.79. L: mild-mod medial calcification throughout lower extremity without stenosis. L ABI 1.22.   Saw Dr Allyson Sabal  for vascular evaluation 03/2020 - given comorbidities and lack of claudication and rest pain, rec conservative management at that time   He's not on aspirin or on statin - has never taken these, due to fall risk. Lab Results  Component Value Date   CHOL 168 04/22/2022   HDL 30 (L) 04/22/2022   LDLCALC 109 (H) 04/22/2022   LDLDIRECT 140.0 08/20/2021   TRIG 146 04/22/2022   CHOLHDL 5.6 04/22/2022        Relevant past medical,  surgical, family and social history reviewed and updated as indicated. Interim medical history since our last visit reviewed. Allergies and medications reviewed and updated. Outpatient Medications Prior to Visit  Medication Sig Dispense Refill   amiodarone (PACERONE) 200 MG tablet Take 1 tablet (200 mg total) TWICE a day for one month, then take 1 tablet ONCE daily thereafter. (Patient taking differently: Take 1 tablet (200 mg total) TWICE a day for one month, then take 1 tablet ONCE daily thereafter. Now takes 1 tablet in the mornings.) 90 tablet 1   Cholecalciferol (VITAMIN D3) 1000 units CAPS Take 1 capsule (1,000 Units total) by mouth daily. 30 capsule    DULoxetine (CYMBALTA) 20 MG capsule Take 2 capsules (40 mg total) by mouth daily. 60 capsule 3   fexofenadine (ALLEGRA ALLERGY) 180 MG tablet Take 1 tablet (180 mg total) by mouth daily.     finasteride (PROSCAR) 5 MG tablet Take 1 tablet by mouth once daily 90 tablet 3   fludrocortisone (FLORINEF) 0.1 MG tablet Take 1 tablet (0.1 mg total) by mouth daily. 30 tablet 0   guaiFENesin (MUCINEX) 600 MG 12 hr tablet Take 1,200 mg by mouth daily.     midodrine (PROAMATINE) 5 MG tablet Take 1 tablet (5 mg total) by mouth  3 (three) times daily with meals. 90 tablet 6   Polyethylene Glycol 3350 (MIRALAX PO) Take 17 g by mouth daily.     Probiotic Product (ALIGN PO) Take by mouth daily.     Sennosides (SENOKOT PO) Take 1 tablet by mouth at bedtime.     vitamin B-12 (V-R VITAMIN B-12) 500 MCG tablet Take 1 tablet (500 mcg total) by mouth daily. (Patient taking differently: Take 500 mcg by mouth at bedtime.)     No facility-administered medications prior to visit.     Per HPI unless specifically indicated in ROS section below Review of Systems  Unable to perform ROS: Dementia    Objective:  BP 120/68   Pulse (!) 56   Temp (!) 97 F (36.1 C) (Temporal)   Ht 6\' 1"  (1.854 m)   Wt 178 lb 2 oz (80.8 kg)   SpO2 95%   BMI 23.50 kg/m   Wt  Readings from Last 3 Encounters:  03/18/23 178 lb 2 oz (80.8 kg)  02/20/23 177 lb (80.3 kg)  01/21/23 177 lb 6 oz (80.5 kg)      Physical Exam Vitals and nursing note reviewed.  Constitutional:      Appearance: Normal appearance.  HENT:     Mouth/Throat:     Mouth: Mucous membranes are moist.     Pharynx: Oropharynx is clear. No oropharyngeal exudate or posterior oropharyngeal erythema.  Eyes:     Extraocular Movements: Extraocular movements intact.     Pupils: Pupils are equal, round, and reactive to light.  Cardiovascular:     Rate and Rhythm: Normal rate and regular rhythm.     Pulses: Normal pulses.     Heart sounds: Normal heart sounds. No murmur heard. Pulmonary:     Effort: Pulmonary effort is normal. No respiratory distress.     Breath sounds: Normal breath sounds. No wheezing, rhonchi or rales.  Musculoskeletal:     Right lower leg: No edema.     Left lower leg: No edema.     Comments:  1+ DP on left, nonpalpable pedal pulses on right No arterial or venous ulcers to BLE Good cap refill bilaterally No palpable cords, no calf or posterior thigh pain with palpation  Skin:    General: Skin is warm and dry.     Findings: No rash.  Neurological:     Mental Status: He is alert.  Psychiatric:        Mood and Affect: Mood normal.        Behavior: Behavior normal.       Results for orders placed or performed in visit on 02/20/23  TSH  Result Value Ref Range   TSH 1.900 0.450 - 4.500 uIU/mL  Hepatic function panel  Result Value Ref Range   Total Protein 6.9 6.0 - 8.5 g/dL   Albumin 4.5 3.7 - 4.7 g/dL   Bilirubin Total 0.6 0.0 - 1.2 mg/dL   Bilirubin, Direct 1.61 0.00 - 0.40 mg/dL   Alkaline Phosphatase 106 44 - 121 IU/L   AST 21 0 - 40 IU/L   ALT 42 0 - 44 IU/L   Assessment & Plan:   Problem List Items Addressed This Visit     Dementia with behavioral disturbance (HCC)    Presumed bvFTD given frontal lobe predominance on imaging.       Recurrent syncope     None recently.       PAD (peripheral artery disease) (HCC)    Rpt arterial duplex and  refer for rpt vascular eval.       Relevant Medications   aspirin EC 81 MG tablet   atorvastatin (LIPITOR) 20 MG tablet   Other Relevant Orders   Ambulatory referral to Cardiology   Paroxysmal atrial flutter (HCC)    H/o parox aflutter with high PVC burden.  Previously off AC due to fall risk.  Now on amiodarone       Relevant Medications   aspirin EC 81 MG tablet   atorvastatin (LIPITOR) 20 MG tablet   Chronic leg pain - Primary    Acute on chronic worsening of R>L lower leg pain in known h/o PAD.  Anticipate progression of PAD with possible claudication and rest pain, difficult to tell for certain given marked dementia.  Will update arterial circulation evaluation.  Reviewed pros/cons of medical management - we had previously decided to avoid additional medication given dementia, comorbidities however with possibly worsening PAD pain, will Rx aspirin, atorvastatin 20mg  daily. Avoid pletal as it can be associated with orthostasis.  ?trial horse chestnut seed extract pending effect of above.       Relevant Medications   aspirin EC 81 MG tablet   Other Relevant Orders   LOWER EXTREMITY ARTERIAL DUPLEX BILAT (BACK OFFICE)   Ambulatory referral to Cardiology     Meds ordered this encounter  Medications   aspirin EC 81 MG tablet    Sig: Take 1 tablet (81 mg total) by mouth daily. Swallow whole.   atorvastatin (LIPITOR) 20 MG tablet    Sig: Take 1 tablet (20 mg total) by mouth daily.    Dispense:  30 tablet    Refill:  3    Orders Placed This Encounter  Procedures   Ambulatory referral to Cardiology    Referral Priority:   Routine    Referral Type:   Consultation    Referral Reason:   Specialty Services Required    Number of Visits Requested:   1    Patient Instructions  We will order repeat artery evaluation through [facility] in [place] - and may return to see Dr Allyson Sabal  artery doctor.  Try aspirin 81mg  daily (blood thinner) as well as atorvastatin 20mg  daily (cholesterol medicine).  Let us know if any trouble with these medicines.   Follow up plan: Return if symptoms worsen or fail to improve.  Eustaquio Boyden, MD

## 2023-03-18 NOTE — Patient Instructions (Addendum)
We will order repeat artery evaluation through [facility] in [place] - and may return to see Dr Allyson Sabal artery doctor.  Try aspirin 81mg  daily (blood thinner) as well as atorvastatin 20mg  daily (cholesterol medicine).  Let us know if any trouble with these medicines.

## 2023-03-18 NOTE — Assessment & Plan Note (Addendum)
None recently 

## 2023-03-18 NOTE — Assessment & Plan Note (Addendum)
Acute on chronic worsening of R>L lower leg pain in known h/o PAD.  Anticipate progression of PAD with possible claudication and rest pain, difficult to tell for certain given marked dementia.  Will update arterial circulation evaluation.  Reviewed pros/cons of medical management - we had previously decided to avoid additional medication given dementia, comorbidities however with possibly worsening PAD pain, will Rx aspirin, atorvastatin 20mg  daily. Avoid pletal as it can be associated with orthostasis.  ?trial horse chestnut seed extract pending effect of above.

## 2023-03-18 NOTE — Assessment & Plan Note (Signed)
Rpt arterial duplex and refer for rpt vascular eval.

## 2023-04-04 ENCOUNTER — Ambulatory Visit: Payer: Medicare Other | Admitting: Podiatry

## 2023-04-04 ENCOUNTER — Ambulatory Visit (INDEPENDENT_AMBULATORY_CARE_PROVIDER_SITE_OTHER): Payer: Medicare Other

## 2023-04-04 DIAGNOSIS — B351 Tinea unguium: Secondary | ICD-10-CM

## 2023-04-04 DIAGNOSIS — S90211A Contusion of right great toe with damage to nail, initial encounter: Secondary | ICD-10-CM

## 2023-04-04 DIAGNOSIS — M79671 Pain in right foot: Secondary | ICD-10-CM

## 2023-04-04 DIAGNOSIS — M79675 Pain in left toe(s): Secondary | ICD-10-CM

## 2023-04-04 DIAGNOSIS — M79674 Pain in right toe(s): Secondary | ICD-10-CM | POA: Diagnosis not present

## 2023-04-04 NOTE — Progress Notes (Signed)
Chief Complaint  Patient presents with   Toe Pain    Patient has right foot hallux pain, started a week ago, toe was swollen and the nail is dark,     SUBJECTIVE Patient PMHx advanced dementia presents to office today complaining of elongated, thickened nails that cause pain while ambulating in shoes.  Patient's wife also states that about a week ago he began to complain of pain to the right great toe.  When she took his shoes and socks off she noticed black discoloration to the right hallux nail plate.  No drainage.  She presents for further treatment evaluation  Past Medical History:  Diagnosis Date   Basal cell carcinoma 08/31/2020   Mid forehead - ED&C    Basal cell carcinoma of right nasal sidewall 12/15/2014   Bilateral hearing loss 08/03/2018   S/p audiology evaluation, has new hearing aides.   Bradycardia    a. 08/2018 ETT: Poor ex tolerance (2:10); b. 09/2018 s/p MDT Linq.   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 09/2018 s/p MDT Linq; b. 10/2021 Echo: EF 55-60%, no rwma, nl RV fxn, mild MR, triv AI.   Thrombocytopenia (HCC) 02/21/2011   periph smear stable 02/2020   Vitamin B12 deficiency 08/03/2018   Completed b12 shots 09/2018, maintaining levels on oral replacement (dissolvable tablets) 01/2019   Vitamin D deficiency 05/19/2018    Allergies  Allergen Reactions   Doxepin Other (See Comments)    Lethargy, overly sedating at low dose   Seroquel [Quetiapine] Other (See Comments)    Worsened agitation, increased BP   Lincomycin Hcl Rash     OBJECTIVE General Patient is awake, alert, and oriented x 3 and in no acute distress. Derm Skin is dry and supple bilateral. Negative open lesions or macerations.  Subungual hematoma noted around the base of the right hallux nail plate.  It appears very stable.  No drainage or erythema around the nail plate.  Nails are tender, long, thickened and dystrophic with subungual debris, consistent with onychomycosis, 1-5 bilateral. No signs of infection noted. Vasc  DP and PT pedal pulses palpable bilaterally. Temperature gradient within normal limits.  Neuro grossly active via light touch Musculoskeletal Exam no pedal deformity noted  ASSESSMENT 1.  Pain due to onychomycosis of toenails both 2.  Subungual hematoma right hallux nail plate  PLAN OF CARE 1. Patient evaluated today.  2. Instructed to maintain good pedal hygiene and foot care.  3. Mechanical debridement of nails 1-5 bilaterally performed using a nail nipper. Filed with dremel without incident.  4.  For now we are simply going to observe the right hallux nail plate and allow it to run its course and grow out with time.  The patient spouse agrees would like to continue conservative observation for now 5 return to clinic as needed   Felecia Shelling, DPM [facility]  Dr. Felecia Shelling, DPM    [address] [place], [place] [address]                Office [phone]  Fax [phone]

## 2023-04-11 ENCOUNTER — Other Ambulatory Visit: Payer: Self-pay | Admitting: Podiatry

## 2023-04-11 DIAGNOSIS — B351 Tinea unguium: Secondary | ICD-10-CM

## 2023-04-11 DIAGNOSIS — S90211A Contusion of right great toe with damage to nail, initial encounter: Secondary | ICD-10-CM

## 2023-04-11 DIAGNOSIS — M79671 Pain in right foot: Secondary | ICD-10-CM

## 2023-04-19 ENCOUNTER — Emergency Department
Admission: EM | Admit: 2023-04-19 | Discharge: 2023-04-19 | Disposition: A | Discharge status: Home / Self Care | Payer: Medicare Other | Attending: Emergency Medicine | Admitting: Emergency Medicine

## 2023-04-19 ENCOUNTER — Other Ambulatory Visit: Payer: Self-pay

## 2023-04-19 ENCOUNTER — Emergency Department: Payer: Medicare Other

## 2023-04-19 DIAGNOSIS — R6889 Other general symptoms and signs: Secondary | ICD-10-CM | POA: Diagnosis not present

## 2023-04-19 DIAGNOSIS — S199XXA Unspecified injury of neck, initial encounter: Secondary | ICD-10-CM | POA: Diagnosis not present

## 2023-04-19 DIAGNOSIS — I951 Orthostatic hypotension: Secondary | ICD-10-CM | POA: Insufficient documentation

## 2023-04-19 DIAGNOSIS — R410 Disorientation, unspecified: Secondary | ICD-10-CM | POA: Diagnosis not present

## 2023-04-19 DIAGNOSIS — S0990XA Unspecified injury of head, initial encounter: Secondary | ICD-10-CM | POA: Diagnosis not present

## 2023-04-19 DIAGNOSIS — R55 Syncope and collapse: Secondary | ICD-10-CM | POA: Diagnosis not present

## 2023-04-19 DIAGNOSIS — R404 Transient alteration of awareness: Secondary | ICD-10-CM | POA: Diagnosis not present

## 2023-04-19 DIAGNOSIS — R7989 Other specified abnormal findings of blood chemistry: Secondary | ICD-10-CM | POA: Diagnosis not present

## 2023-04-19 DIAGNOSIS — I499 Cardiac arrhythmia, unspecified: Secondary | ICD-10-CM | POA: Diagnosis not present

## 2023-04-19 DIAGNOSIS — D696 Thrombocytopenia, unspecified: Secondary | ICD-10-CM | POA: Diagnosis not present

## 2023-04-19 LAB — COMPREHENSIVE METABOLIC PANEL
ALT: 24 U/L (ref 0–44)
AST: 25 U/L (ref 15–41)
Albumin: 3.8 g/dL (ref 3.5–5.0)
Alkaline Phosphatase: 74 U/L (ref 38–126)
Anion gap: 8 (ref 5–15)
BUN: 17 mg/dL (ref 8–23)
CO2: 27 mmol/L (ref 22–32)
Calcium: 8.9 mg/dL (ref 8.9–10.3)
Chloride: 103 mmol/L (ref 98–111)
Creatinine, Ser: 1.49 mg/dL — ABNORMAL HIGH (ref 0.61–1.24)
GFR, Estimated: 45 mL/min — ABNORMAL LOW (ref >60)
Glucose, Bld: 137 mg/dL — ABNORMAL HIGH (ref 70–99)
Potassium: 4.1 mmol/L (ref 3.5–5.1)
Sodium: 138 mmol/L (ref 135–145)
Total Bilirubin: 0.6 mg/dL (ref 0.3–1.2)
Total Protein: 6.5 g/dL (ref 6.5–8.1)

## 2023-04-19 LAB — CBC WITH DIFFERENTIAL/PLATELET
Abs Immature Granulocytes: 0.02 10*3/uL (ref 0.00–0.07)
Basophils Absolute: 0.1 10*3/uL (ref 0.0–0.1)
Basophils Relative: 1 %
Eosinophils Absolute: 0.1 10*3/uL (ref 0.0–0.5)
Eosinophils Relative: 1 %
HCT: 46.2 % (ref 39.0–52.0)
Hemoglobin: 15.8 g/dL (ref 13.0–17.0)
Immature Granulocytes: 0 %
Lymphocytes Relative: 20 %
Lymphs Abs: 1.1 10*3/uL (ref 0.7–4.0)
MCH: 32.6 pg (ref 26.0–34.0)
MCHC: 34.2 g/dL (ref 30.0–36.0)
MCV: 95.5 fL (ref 80.0–100.0)
Monocytes Absolute: 0.5 10*3/uL (ref 0.1–1.0)
Monocytes Relative: 9 %
Neutro Abs: 3.8 10*3/uL (ref 1.7–7.7)
Neutrophils Relative %: 69 %
Platelets: 115 10*3/uL — ABNORMAL LOW (ref 150–400)
RBC: 4.84 MIL/uL (ref 4.22–5.81)
RDW: 13.1 % (ref 11.5–15.5)
WBC: 5.6 10*3/uL (ref 4.0–10.5)
nRBC: 0 % (ref 0.0–0.2)

## 2023-04-19 LAB — TROPONIN I (HIGH SENSITIVITY)
Troponin I (High Sensitivity): 8 ng/L (ref <18)
Troponin I (High Sensitivity): 9 ng/L (ref <18)

## 2023-04-19 MED ORDER — MIDODRINE HCL 5 MG PO TABS
5.0000 mg | ORAL_TABLET | Freq: Once | ORAL | Status: AC
  Administered 2023-04-19: 5 mg via ORAL
  Filled 2023-04-19: qty 1

## 2023-04-19 MED ORDER — SODIUM CHLORIDE 0.9 % IV BOLUS
500.0000 mL | Freq: Once | INTRAVENOUS | Status: AC
  Administered 2023-04-19: 500 mL via INTRAVENOUS

## 2023-04-19 MED ORDER — MIDODRINE HCL 5 MG PO TABS: 10.0000 mg | ORAL_TABLET | Freq: Once | ORAL | Status: DC

## 2023-04-19 NOTE — ED Triage Notes (Signed)
Arrived by EMS from home with c/o fall. EMS reports possible syncopal episode once arriving to scene. History syncopal episodes.   History dementia   EMS vitals: 80/40b/p 40sHR 135CBG

## 2023-04-19 NOTE — ED Notes (Signed)
Pt able to ambulate without complaint of dizziness or lightheadedness. Gait is steady. Wife states gait appears to be baseline.

## 2023-04-19 NOTE — Discharge Instructions (Signed)
I suspect this is related to his orthostatic hypotension and admitted missed midodrine dose.  Return to the ER if he develops recurrent symptoms fevers or any other concerns.

## 2023-04-19 NOTE — ED Notes (Signed)
Per patient's wife, patient briefly passes while out in the shower. She states his blood pressure was a little low when she got him out of bed, but she did not give him his midodrine; states she normally gives him his meds with breakfast. Wife states pt did not hit his head when he fell.

## 2023-04-19 NOTE — ED Provider Notes (Signed)
[facility] Provider Note    Event Date/Time   First MD Initiated Contact with Patient 04/19/23 1017     (approximate)   History   Loss of Consciousness   HPI  Dylan [name] is a 89 y.o. male with history of orthostatic hypotension who comes in with concerns for syncopal episode.  Patient was in the shower when he felt lightheaded and there was concern for potential brief LOC.  According to wife there was maybe a few seconds where he slid down.  Initially unclear though if he had hit his head.  Patient himself is acting his normal self now he denies any chest pain, shortness of breath.  There was concern for possible LOC.  He has not taken his midodrine yet today.  He is typically on midodrine 5 mg.   Physical Exam   Triage Vital Signs: ED Triage Vitals  Enc Vitals Group     BP 04/19/23 1019 (!) 94/47     Pulse Rate 04/19/23 1019 (!) 48     Resp 04/19/23 1019 16     Temp 04/19/23 1031 (!) 97.2 F (36.2 C)     Temp Source 04/19/23 1031 Rectal     SpO2 04/19/23 1019 95 %     Weight 04/19/23 1020 200 lb (90.7 kg)     Height 04/19/23 1020 6\' 1"  (1.854 m)     Head Circumference --      Peak Flow --      Pain Score 04/19/23 1030 0     Pain Loc --      Pain Edu? --      Excl. in GC? --     Most recent vital signs: Vitals:   04/19/23 1019 04/19/23 1031  BP: (!) 94/47   Pulse: (!) 48   Resp: 16   Temp:  (!) 97.2 F (36.2 C)  SpO2: 95%      General: Awake, no distress.  CV:  Good peripheral perfusion.  Resp:  Normal effort.  Abd:  No distention.  Soft nontender Other:  Equal strength in arms and legs sensation intact.   ED Results / Procedures / Treatments   Labs (all labs ordered are listed, but only abnormal results are displayed) Labs Reviewed  CBC WITH DIFFERENTIAL/PLATELET - Abnormal; Notable for the following components:      Result Value   Platelets 115 (*)    All other components within normal limits  COMPREHENSIVE METABOLIC  PANEL  URINALYSIS, ROUTINE W REFLEX MICROSCOPIC  TROPONIN I (HIGH SENSITIVITY)     EKG  My interpretation of EKG:  Sinus bradycardia rate of 45 without any ST elevations or T wave inversions, right bundle branch block and left anterior fascicular block  I reviewed patient's prior EKG from 02/20/2023 patient has bifascicular block there as well  RADIOLOGY I have reviewed the CT had personally interpreted and no evidence of intracranial hemorrhage  PROCEDURES:  Critical Care performed: No  .1-3 Lead EKG Interpretation  Performed by: Concha Se, MD Authorized by: Concha Se, MD     Interpretation: abnormal     ECG rate:  50   ECG rate assessment: normal     Rhythm: sinus bradycardia     Ectopy: none     Conduction: normal      MEDICATIONS ORDERED IN ED: Medications  sodium chloride 0.9 % bolus 500 mL (500 mLs Intravenous New Bag/Given 04/19/23 1124)  midodrine (PROAMATINE) tablet 5 mg (5 mg Oral Given  04/19/23 1142)     IMPRESSION / MDM / ASSESSMENT AND PLAN / ED COURSE  I reviewed the triage vital signs and the nursing notes.   Patient's presentation is most consistent with acute presentation with potential threat to life or bodily function.   Suspect most likely orthostatic hypotension related to not taking his midodrine this morning potentially some mild dehydration.  Patient ordered some mild fluids and midodrine.  He is asymptomatic at this time.  CT imaging ordered evaluate for intracranial hemorrhage, cervical fracture basic labs ordered to evaluate for Electra abnormalities, AKI.  IMPRESSION: 1. No evidence of acute intracranial or cervical spine injury. 2. Advanced bifrontal atrophy.  Troponin is negative.  CBC reassuring slightly low platelets similar to prior.  CMP shows slightly elevated creatinine getting some fluids.  Patient has notable similar bifascicular block.  However reviewed the note from cardiology where he has an ILR in place that has not  shown a cause for the syncope he is on amiodarone for high PVC burden.  He does follow with [facility].   Given bifasc block-I did discuss with Dr. Mariah Milling from [facility] cardiology to see if there was any plans for pacemaker but on review of these records it does not seem like this was a plan.  Syncope was thought most likely to be secondary to his hypotensive related to orthostatics and missed midodrine- recommended compression socks, abdominal binder, and if BP needs can go up on midodrine to 10mg .    Reevaluated patient he is acting his normal baseline self blood pressures are family is at bedside we discussed admission versus going home but he had episodes of orthostatic hypotension in the past this is most likely secondary to the missed midodrine dose and he is acting his baseline self now and they feel comfortable with discharge home.  Will do ambulation trial and discharge patient.  No urinary symptoms, fevers, white count to suggest needing a UA and he would have to be straight cath so family declined  The patient is on the cardiac monitor to evaluate for evidence of arrhythmia and/or significant heart rate changes.      FINAL CLINICAL IMPRESSION(S) / ED DIAGNOSES   Final diagnoses:  Syncope, unspecified syncope type  Orthostatic hypotension     Rx / DC Orders   ED Discharge Orders     None        Note:  This document was prepared using Dragon voice recognition software and may include unintentional dictation errors.   Concha Se, MD 04/19/23 [phone]

## 2023-04-22 ENCOUNTER — Ambulatory Visit (INDEPENDENT_AMBULATORY_CARE_PROVIDER_SITE_OTHER): Payer: Medicare Other | Admitting: Family Medicine

## 2023-04-22 ENCOUNTER — Encounter: Payer: Self-pay | Admitting: Family Medicine

## 2023-04-22 VITALS — BP 160/84 | HR 53 | Temp 97.2°F | Ht 73.0 in | Wt 177.5 lb

## 2023-04-22 DIAGNOSIS — F03918 Unspecified dementia, unspecified severity, with other behavioral disturbance: Secondary | ICD-10-CM

## 2023-04-22 DIAGNOSIS — N1831 Chronic kidney disease, stage 3a: Secondary | ICD-10-CM

## 2023-04-22 DIAGNOSIS — I951 Orthostatic hypotension: Secondary | ICD-10-CM

## 2023-04-22 DIAGNOSIS — I739 Peripheral vascular disease, unspecified: Secondary | ICD-10-CM

## 2023-04-22 DIAGNOSIS — G8929 Other chronic pain: Secondary | ICD-10-CM | POA: Diagnosis not present

## 2023-04-22 DIAGNOSIS — W19XXXA Unspecified fall, initial encounter: Secondary | ICD-10-CM

## 2023-04-22 DIAGNOSIS — M79605 Pain in left leg: Secondary | ICD-10-CM | POA: Diagnosis not present

## 2023-04-22 DIAGNOSIS — M79604 Pain in right leg: Secondary | ICD-10-CM

## 2023-04-22 DIAGNOSIS — R55 Syncope and collapse: Secondary | ICD-10-CM

## 2023-04-22 NOTE — Patient Instructions (Addendum)
Take midodrine first thing in the morning with applesauce.  Last midodrine dose at least 4 hours before bedtime.  Continue abdominal binder use.  Continue encouraging fluids.  Will hold compression stockings until we get arterial circulation evaluation.  Good to see you today. Return in 3 months for follow up visit.  I will check into ABIs I ordered last month.

## 2023-04-22 NOTE — Progress Notes (Signed)
Ph: [phone] Fax: [phone]   Patient ID: Dylan [name], male    DOB: 1934/01/01, 89 y.o.   MRN: [id number]  This visit was conducted in person.  BP (!) 160/84 (BP Location: Right Arm, Cuff Size: Normal)   Pulse (!) 53   Temp (!) 97.2 F (36.2 C) (Temporal)   Ht 6\' 1"  (1.854 m)   Wt 177 lb 8 oz (80.5 kg)   SpO2 98%   BMI 23.42 kg/m   BP Readings from Last 3 Encounters:  04/22/23 (!) 160/84  04/19/23 136/67  03/18/23 120/68   Orthostatic VS for the past 72 hrs (Last 3 readings):  Orthostatic BP Patient Position BP Location Cuff Size  04/22/23 1512 -- -- Right Arm Normal  04/22/23 1446 136/74 Standing -- --  04/22/23 1443 156/70 Supine -- --  04/22/23 1433 -- Supine -- --   CC: 3 mo f/u visit  Subjective:   HPI: Dylan [name] is a 89 y.o. male presenting on 04/22/2023 for Medical Management of Chronic Issues (Here for 3 mo f/u. Also, pt seen on 04/19/23 at [facility] ED, dx, syncope; orthostatic hypotension. Pt accompanied by wife, [name]. )   Recent ER visit for episode of syncope while in shower Saturday morning. ER records reviewed. Hypotensive to 94/47, HR 48.  Head and neck CT - advanced bifrontal atrophy, no acute change.  Rec continue midodrine 5mg  TID with meals, florinef 0.1mg  daily, compression stockings, abdominal binder and if needed could increase midodrine to 10mg .   Sunday BPs: am 132/85, 140, 165, 173, 183, 92 systolic - wide fluctuations.  Monday BPs: am 139/52, 155/75, 131/73, 135/77.   Saw Dr Allyson Sabal for vascular evaluation 03/2020 - given comorbidities and lack of claudication and rest pain, rec conservative management at that time  Rpt arterial duplex pending next month.      Relevant past medical, surgical, family and social history reviewed and updated as indicated. Interim medical history since our last visit reviewed. Allergies and medications reviewed and updated. Outpatient Medications Prior to Visit  Medication Sig Dispense Refill    amiodarone (PACERONE) 200 MG tablet Take 1 tablet (200 mg total) TWICE a day for one month, then take 1 tablet ONCE daily thereafter. (Patient taking differently: Take 1 tablet (200 mg total) TWICE a day for one month, then take 1 tablet ONCE daily thereafter. Now takes 1 tablet in the mornings.) 90 tablet 1   aspirin EC 81 MG tablet Take 1 tablet (81 mg total) by mouth daily. Swallow whole.     atorvastatin (LIPITOR) 20 MG tablet Take 1 tablet (20 mg total) by mouth daily. 30 tablet 3   Cholecalciferol (VITAMIN D3) 1000 units CAPS Take 1 capsule (1,000 Units total) by mouth daily. 30 capsule    DULoxetine (CYMBALTA) 20 MG capsule Take 2 capsules (40 mg total) by mouth daily. 60 capsule 3   fexofenadine (ALLEGRA ALLERGY) 180 MG tablet Take 1 tablet (180 mg total) by mouth daily.     finasteride (PROSCAR) 5 MG tablet Take 1 tablet by mouth once daily 90 tablet 3   fludrocortisone (FLORINEF) 0.1 MG tablet Take 1 tablet (0.1 mg total) by mouth daily. 30 tablet 0   guaiFENesin (MUCINEX) 600 MG 12 hr tablet Take 1,200 mg by mouth daily.     midodrine (PROAMATINE) 5 MG tablet Take 1 tablet (5 mg total) by mouth 3 (three) times daily with meals. 90 tablet 6   Polyethylene Glycol 3350 (MIRALAX PO) Take 17 g by  mouth daily.     Probiotic Product (ALIGN PO) Take by mouth daily.     Sennosides (SENOKOT PO) Take 1 tablet by mouth at bedtime.     vitamin B-12 (V-R VITAMIN B-12) 500 MCG tablet Take 1 tablet (500 mcg total) by mouth daily. (Patient taking differently: Take 500 mcg by mouth at bedtime.)     No facility-administered medications prior to visit.     Per HPI unless specifically indicated in ROS section below Review of Systems  Objective:  BP (!) 160/84 (BP Location: Right Arm, Cuff Size: Normal)   Pulse (!) 53   Temp (!) 97.2 F (36.2 C) (Temporal)   Ht 6\' 1"  (1.854 m)   Wt 177 lb 8 oz (80.5 kg)   SpO2 98%   BMI 23.42 kg/m   Wt Readings from Last 3 Encounters:  04/22/23 177 lb 8 oz (80.5  kg)  04/19/23 200 lb (90.7 kg)  03/18/23 178 lb 2 oz (80.8 kg)      Physical Exam Vitals and nursing note reviewed.  Constitutional:      Appearance: Normal appearance. He is not ill-appearing.  Cardiovascular:     Rate and Rhythm: Normal rate and regular rhythm.     Pulses: Normal pulses.     Heart sounds: Normal heart sounds. No murmur heard. Pulmonary:     Effort: Pulmonary effort is normal. No respiratory distress.     Breath sounds: Normal breath sounds. No wheezing, rhonchi or rales.  Musculoskeletal:     Right lower leg: No edema.     Left lower leg: No edema.  Skin:    General: Skin is warm and dry.     Findings: No rash.  Neurological:     Mental Status: He is alert.  Psychiatric:        Mood and Affect: Mood normal.        Behavior: Behavior normal.       Results for orders placed or performed during the hospital encounter of 04/19/23  CBC with Differential  Result Value Ref Range   WBC 5.6 4.0 - 10.5 K/uL   RBC 4.84 4.22 - 5.81 MIL/uL   Hemoglobin 15.8 13.0 - 17.0 g/dL   HCT 19.1 47.8 - 29.5 %   MCV 95.5 80.0 - 100.0 fL   MCH 32.6 26.0 - 34.0 pg   MCHC 34.2 30.0 - 36.0 g/dL   RDW 62.1 30.8 - 65.7 %   Platelets 115 (L) 150 - 400 K/uL   nRBC 0.0 0.0 - 0.2 %   Neutrophils Relative % 69 %   Neutro Abs 3.8 1.7 - 7.7 K/uL   Lymphocytes Relative 20 %   Lymphs Abs 1.1 0.7 - 4.0 K/uL   Monocytes Relative 9 %   Monocytes Absolute 0.5 0.1 - 1.0 K/uL   Eosinophils Relative 1 %   Eosinophils Absolute 0.1 0.0 - 0.5 K/uL   Basophils Relative 1 %   Basophils Absolute 0.1 0.0 - 0.1 K/uL   Immature Granulocytes 0 %   Abs Immature Granulocytes 0.02 0.00 - 0.07 K/uL  Comprehensive metabolic panel  Result Value Ref Range   Sodium 138 135 - 145 mmol/L   Potassium 4.1 3.5 - 5.1 mmol/L   Chloride 103 98 - 111 mmol/L   CO2 27 22 - 32 mmol/L   Glucose, Bld 137 (H) 70 - 99 mg/dL   BUN 17 8 - 23 mg/dL   Creatinine, Ser 8.46 (H) 0.61 - 1.24 mg/dL   Calcium 8.9  8.9 - 10.3  mg/dL   Total Protein 6.5 6.5 - 8.1 g/dL   Albumin 3.8 3.5 - 5.0 g/dL   AST 25 15 - 41 U/L   ALT 24 0 - 44 U/L   Alkaline Phosphatase 74 38 - 126 U/L   Total Bilirubin 0.6 0.3 - 1.2 mg/dL   GFR, Estimated 45 (L) >60 mL/min   Anion gap 8 5 - 15  Troponin I (High Sensitivity)  Result Value Ref Range   Troponin I (High Sensitivity) 8 <18 ng/L  Troponin I (High Sensitivity)  Result Value Ref Range   Troponin I (High Sensitivity) 9 <18 ng/L    Assessment & Plan:   Problem List Items Addressed This Visit     Fall   Dementia with behavioral disturbance (HCC)    Recent head CT also with evidence of advanced bifrontal atrophy.  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 Not on aricept for this reason (and concern for side effect of altered cardiac conduction).       Recurrent syncope   CKD (chronic kidney disease) stage 3, GFR 30-59 ml/min (HCC)    Latest GFR 45.  BP elevated - treating to standing blood pressure given marked orthostasis.       PAD (peripheral artery disease) (HCC)    I ordered repeat arterial duplex for updated evaluation for PAD.  In chart it shows "Exam ended" but they have not yet had this done.  Pending results prior to recommending starting compression stockings.       Orthostatic syncope - Primary    Recurrent episode over weekend led to ER visit. Records reviewed, overall reassuring evaluation.  Discussed midodrine dosing - will dose first pill when he wakes up, discussed TID dosing Q3-4 hours during day, last dose to be taken at least 4 hours before bedtime.  Will update [place] with effect of dosing change.  Continue good hydration. Continue abdominal binder. Pending peripheral vascular evaluation next month prior to starting compression stockings.       Chronic leg pain    This is somewhat better. Pending r/o PAD as cause.         No orders of the defined types were placed in this encounter.   No orders of the defined types were placed in this encounter.   Patient  Instructions  Take midodrine first thing in the morning with applesauce.  Last midodrine dose at least 4 hours before bedtime.  Continue abdominal binder use.  Continue encouraging fluids.  Will hold compression stockings until we get arterial circulation evaluation.  Good to see you today. Return in 3 months for follow up visit.  I will check into ABIs I ordered last month.   Follow up plan: Return in about 3 months (around 07/23/2023), or if symptoms worsen or fail to improve, for follow up visit.  Eustaquio Boyden, MD

## 2023-04-23 NOTE — Assessment & Plan Note (Signed)
Latest GFR 45.  BP elevated - treating to standing blood pressure given marked orthostasis.

## 2023-04-23 NOTE — Assessment & Plan Note (Addendum)
Recurrent episode over weekend led to ER visit. Records reviewed, overall reassuring evaluation.  Discussed midodrine dosing - will dose first pill when he wakes up, discussed TID dosing Q3-4 hours during day, last dose to be taken at least 4 hours before bedtime.  Will update [place] with effect of dosing change.  Continue good hydration. Continue abdominal binder. Pending peripheral vascular evaluation next month prior to starting compression stockings.

## 2023-04-23 NOTE — Assessment & Plan Note (Signed)
I ordered repeat arterial duplex for updated evaluation for PAD.  In chart it shows "Exam ended" but they have not yet had this done.  Pending results prior to recommending starting compression stockings.

## 2023-04-23 NOTE — Assessment & Plan Note (Addendum)
Recent head CT also with evidence of advanced bifrontal atrophy.  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 Not on aricept for this reason (and concern for side effect of altered cardiac conduction).

## 2023-04-23 NOTE — Assessment & Plan Note (Signed)
This is somewhat better. Pending r/o PAD as cause.

## 2023-04-24 ENCOUNTER — Telehealth: Payer: Self-pay

## 2023-04-24 NOTE — Telephone Encounter (Signed)
Transition Care Management Unsuccessful Follow-up Telephone Call  Date of discharge and from where:  [place] 6/8  Attempts:  1st Attempt  Reason for unsuccessful TCM follow-up call:  Left voice message   Lenard Forth [facility] Guide, [facility] [phone] [address] [place], [place], [place] [address] Phone: [phone] Email: [email or web address]

## 2023-04-25 ENCOUNTER — Telehealth: Payer: Self-pay

## 2023-04-25 NOTE — Telephone Encounter (Signed)
Transition Care Management Follow-up Telephone Call Date of discharge and from where: [place] 6/8 How have you been since you were released from the hospital? Doing good  Any questions or concerns? No  Items Reviewed: Did the pt receive and understand the discharge instructions provided? Yes  Medications obtained and verified? Yes  Other? No  Any new allergies since your discharge? No  Dietary orders reviewed? No Do you have support at home? Yes    Follow up appointments reviewed:  PCP Hospital f/u appt confirmed? Yes  Scheduled to see PCP on 6/11 @ . Specialist Hospital f/u appt confirmed? No  Scheduled to see  on  @ . Are transportation arrangements needed? No  If their condition worsens, is the pt aware to call PCP or go to the Emergency Dept.? Yes Was the patient provided with contact information for the PCP's office or ED? Yes Was to pt encouraged to call back with questions or concerns? Yes

## 2023-04-28 ENCOUNTER — Encounter: Payer: Medicare Other | Admitting: Pharmacist

## 2023-04-28 ENCOUNTER — Encounter: Payer: Self-pay | Admitting: Cardiovascular Disease

## 2023-04-28 ENCOUNTER — Telehealth: Payer: Self-pay | Admitting: Family Medicine

## 2023-04-28 ENCOUNTER — Ambulatory Visit: Payer: Medicare Other | Attending: Cardiovascular Disease | Admitting: Cardiovascular Disease

## 2023-04-28 VITALS — BP 136/64 | HR 51 | Ht 73.0 in | Wt 181.0 lb

## 2023-04-28 DIAGNOSIS — E785 Hyperlipidemia, unspecified: Secondary | ICD-10-CM

## 2023-04-28 DIAGNOSIS — I739 Peripheral vascular disease, unspecified: Secondary | ICD-10-CM | POA: Diagnosis not present

## 2023-04-28 DIAGNOSIS — R001 Bradycardia, unspecified: Secondary | ICD-10-CM | POA: Diagnosis not present

## 2023-04-28 NOTE — Assessment & Plan Note (Signed)
History of syncope status post loop recorder implantation by Dr. Elberta Fortis several years ago.  It is now end-of-life.  He does have PVCs on amiodarone.  He saw Dr. Elberta Fortis 02/20/2023.  He was seen in the ER because of syncope 04/19/2023 at which point his heart rate was 45 with right bundle branch block/left intrafascicular block.  Given his age and progressive dementia I do not think he is a candidate for a pacemaker and therefore I will not perform any additional diagnostic testing.  He is a DNR.

## 2023-04-28 NOTE — Progress Notes (Signed)
04/28/2023 Dylan [name]   05-17-1934  [id number]  Primary Physician Dylan Boyden, MD Primary Cardiologist: Dylan Gess MD Dylan [name], [place]  HPI:  Dylan [name] is a 89 y.o. male  married Caucasian male father of 3 children, grandfather of 3 grandchildren is accompanied by his wife Dylan [name] today.  He is retired from working in Loss adjuster, chartered for 65 years.  He was referred by Dr. Sharen [name] for peripheral vascular evaluation and treatment.  I last saw him in the office 03/28/2020.  He has no cardiac risk factors.  Is never had a heart attack or stroke.  He denies chest pain or shortness of breath.  Does have some mild dyspnea.  He has had syncope in the past but not recently and has had a loop recorder implanted followed by Dr. Elberta [name].  He is minimally ambulatory.  There is no evidence of critical ischemia.  He denies claudication.  He had Dopplers performed in our office 03/15/2020 revealed a right ankle-brachial index of 0.79 with what appears to be high-grade disease in the popliteal artery and a left of 1.22 which was essentially normal.  Since I saw him in the office 2-1/2 years ago he was referred back to Dr. Sharen [name] for PAD reevaluation.  He has progressive dementia and is basically confined to his home and is minimally ambulatory.  He spends most of the time sitting in a chair.  He does not have evidence of critical limb ischemia but does complain of some pain in his right leg.  He is also followed by Dr. Elberta [name] for syncope.  He had a loop recorder implanted 09/30/2018 which has expired.  He is on amiodarone for PVCs and was recently seen in the emergency room because of syncope 04/19/2023 at which time his heart rate was 45 with right bundle branch block and left anterior fascicular block.  He denies chest pain or shortness of breath.  He is a DNR.   Current Meds  Medication Sig   amiodarone (PACERONE) 200 MG tablet Take 1 tablet (200 mg total) TWICE a day for one  month, then take 1 tablet ONCE daily thereafter. (Patient taking differently: Take 1 tablet (200 mg total) TWICE a day for one month, then take 1 tablet ONCE daily thereafter. Now takes 1 tablet in the mornings.)   aspirin EC 81 MG tablet Take 1 tablet (81 mg total) by mouth daily. Swallow whole.   atorvastatin (LIPITOR) 20 MG tablet Take 1 tablet (20 mg total) by mouth daily.   Cholecalciferol (VITAMIN D3) 1000 units CAPS Take 1 capsule (1,000 Units total) by mouth daily.   DULoxetine (CYMBALTA) 20 MG capsule Take 2 capsules (40 mg total) by mouth daily.   fexofenadine (ALLEGRA ALLERGY) 180 MG tablet Take 1 tablet (180 mg total) by mouth daily.   finasteride (PROSCAR) 5 MG tablet Take 1 tablet by mouth once daily   fludrocortisone (FLORINEF) 0.1 MG tablet Take 1 tablet (0.1 mg total) by mouth daily.   guaiFENesin (MUCINEX) 600 MG 12 hr tablet Take 1,200 mg by mouth daily.   midodrine (PROAMATINE) 5 MG tablet Take 1 tablet (5 mg total) by mouth 3 (three) times daily with meals.   Polyethylene Glycol 3350 (MIRALAX PO) Take 17 g by mouth daily.   Probiotic Product (ALIGN PO) Take by mouth daily.   Sennosides (SENOKOT PO) Take 1 tablet by mouth at bedtime.   vitamin B-12 (V-R VITAMIN B-12) 500 MCG tablet Take 1 tablet (  500 mcg total) by mouth daily. 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, broadcasting, Location manager  Tobacco Use   Smoking status: Former    Types: Cigarettes     Quit date: 11/11/1960    Years since quitting: 62.5   Smokeless tobacco: Never  Vaping Use   Vaping Use: Never used  Substance and Sexual Activity   Alcohol use: No   Drug use: No   Sexual activity: Not Currently  Other Topics Concern   Not on file  Social History Narrative   Lives with wife Dylan [name])   One stepson   Occ: retired, Agricultural consultant, 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, PDR   Edu: HS   Social Determinants of Health   Financial Resource Strain: Low Risk  (10/28/2022)   Overall Financial Resource Strain (CARDIA)    Difficulty of Paying Living Expenses: Not hard at all  Food Insecurity: No Food Insecurity (10/28/2022)   Hunger Vital Sign    Worried About Running Out of Food in the Last Year: Never true    Ran Out of Food in the Last Year: Never true  Transportation Needs: No Transportation Needs (10/28/2022)   PRAPARE - Administrator, Civil Service (Medical): No    Lack of Transportation (Non-Medical): No  Physical Activity: Not on file  Stress: Not on file  Social Connections: Not on file  Intimate Partner Violence: Not on file     Review of Systems: General: negative for chills, fever, night sweats or weight changes.  Cardiovascular: negative for chest pain, dyspnea on exertion, edema, orthopnea, palpitations, paroxysmal nocturnal dyspnea or shortness of breath Dermatological: negative for rash Respiratory: negative for cough or wheezing Urologic: negative for hematuria Abdominal: negative for nausea, vomiting, diarrhea, bright red blood per rectum, melena, or hematemesis Neurologic: negative for visual changes, syncope, or dizziness All other systems reviewed and are otherwise negative except as noted above.    Blood pressure 136/64, pulse (!) 51, height 6\' 1"  (1.854 m), weight 181 lb (82.1 kg), SpO2 96 %.  General appearance: alert and no distress Neck: no adenopathy, no carotid bruit, no JVD, supple, symmetrical, trachea midline, and thyroid not  enlarged, symmetric, no tenderness/mass/nodules Lungs: clear to auscultation bilaterally Heart: regular rate and rhythm, S1, S2 normal, no murmur, click, rub or gallop Extremities: extremities normal, atraumatic, no cyanosis or edema Pulses: 2+ and symmetric Diminished right pedal pulse Skin: Skin color, texture, turgor normal. No rashes or lesions Neurologic: Grossly normal  EKG not performed today   ASSESSMENT AND PLAN:   Dyslipidemia History of dyslipidemia on statin therapy for lipid profile performed 04/22/2022 revealing a total cholesterol of 168, LDL 109 and HDL 30.  PAD (peripheral artery disease) (HCC) History of PAD with Dopplers performed 03/15/2020 revealing a right ABI 0.79 and a left of 1.22.  He had right popliteal artery disease.  He is minimally ambulatory  and mostly sits in the chair at home because of his advanced dementia.  He complains of pain in his right leg but it is in his entire leg.  This point, given the lack of critical ischemia and his age I do not feel he is an appropriate candidate to do intervention and we will treat him conservatively.  Bradycardia History of syncope status post loop recorder implantation by Dr. Elberta [name] several years ago.  It is now end-of-life.  He does have PVCs on amiodarone.  He saw Dr. Elberta [name] 02/20/2023.  He was seen in the ER because of syncope 04/19/2023 at which point his heart rate was 45 with right bundle branch block/left intrafascicular block.  Given his age and progressive dementia I do not think he is a candidate for a pacemaker and therefore I will not perform any additional diagnostic testing.  He is a DNR.     Dylan Gess MD FACP,FACC,FAHA, [facility] 04/28/2023 3:51 PM

## 2023-04-28 NOTE — Telephone Encounter (Signed)
Dr Jodi Geralds cardiology called stating patient had Lower extremities doppler on May 7,2024,they are not able to see those results,and would like to have them faxed over.Patient has an appointment with them this afternoon.   FAX#:[phone]

## 2023-04-28 NOTE — Telephone Encounter (Signed)
I don't see that results were reviewed. Plz advise.

## 2023-04-28 NOTE — Patient Instructions (Signed)
    Follow-Up: At [facility], you and your health needs are our priority.  As part of our continuing mission to provide you with exceptional heart care, we have created designated Provider Care Teams.  These Care Teams include your primary Cardiologist (physician) and Advanced Practice Providers (APPs -  Physician Assistants and Nurse Practitioners) who all work together to provide you with the care you need, when you need it.  We recommend signing up for the patient portal called "MyChart".  Sign up information is provided on this After Visit Summary.  MyChart is used to connect with patients for Virtual Visits (Telemedicine).  Patients are able to view lab/test results, encounter notes, upcoming appointments, etc.  Non-urgent messages can be sent to your provider as well.   To learn more about what you can do with MyChart, go to [email or web address].    Your next appointment:   As needed        

## 2023-04-28 NOTE — Assessment & Plan Note (Signed)
History of dyslipidemia on statin therapy for lipid profile performed 04/22/2022 revealing a total cholesterol of 168, LDL 109 and HDL 30.

## 2023-04-28 NOTE — Assessment & Plan Note (Signed)
History of PAD with Dopplers performed 03/15/2020 revealing a right ABI 0.79 and a left of 1.22.  He had right popliteal artery disease.  He is minimally ambulatory and mostly sits in the chair at home because of his advanced dementia.  He complains of pain in his right leg but it is in his entire leg.  This point, given the lack of critical ischemia and his age I do not feel he is an appropriate candidate to do intervention and we will treat him conservatively.

## 2023-04-28 NOTE — Telephone Encounter (Addendum)
Lower extremity dopplers were not done. I must have ordered it incorrectly because they were ordered, released, arrived and "completed" within a few minutes of being ordered. Pt did not go to get this done. And I don't know how to cancel this ordered.   Will cc Dr Allyson Sabal.

## 2023-05-05 ENCOUNTER — Encounter: Payer: Self-pay | Admitting: Pharmacist

## 2023-05-05 NOTE — Progress Notes (Signed)
Patient previously followed by [facility] pharmacist. Per clinical review, no pharmacist appointment needed at this time. Care guide directed to contact patient and cancel appointment and notify pharmacy team of any patient concerns.  

## 2023-05-14 ENCOUNTER — Ambulatory Visit: Payer: Medicare Other | Admitting: Cardiovascular Disease

## 2023-06-01 ENCOUNTER — Other Ambulatory Visit: Payer: Self-pay | Admitting: Cardiology

## 2023-06-06 ENCOUNTER — Encounter: Payer: Self-pay | Admitting: Family Medicine

## 2023-06-06 ENCOUNTER — Ambulatory Visit (INDEPENDENT_AMBULATORY_CARE_PROVIDER_SITE_OTHER): Payer: Medicare Other | Admitting: Family Medicine

## 2023-06-06 VITALS — BP 164/70 | HR 53 | Temp 97.3°F | Ht 73.0 in | Wt 179.0 lb

## 2023-06-06 DIAGNOSIS — Z66 Do not resuscitate: Secondary | ICD-10-CM | POA: Diagnosis not present

## 2023-06-06 DIAGNOSIS — I739 Peripheral vascular disease, unspecified: Secondary | ICD-10-CM

## 2023-06-06 DIAGNOSIS — I4892 Unspecified atrial flutter: Secondary | ICD-10-CM

## 2023-06-06 DIAGNOSIS — I951 Orthostatic hypotension: Secondary | ICD-10-CM | POA: Diagnosis not present

## 2023-06-06 DIAGNOSIS — F03918 Unspecified dementia, unspecified severity, with other behavioral disturbance: Secondary | ICD-10-CM | POA: Diagnosis not present

## 2023-06-06 DIAGNOSIS — F66 Other sexual disorders: Secondary | ICD-10-CM

## 2023-06-06 MED ORDER — ATORVASTATIN CALCIUM 20 MG PO TABS: 20.0000 mg | ORAL_TABLET | Freq: Every day | ORAL | 1 refills | Status: DC

## 2023-06-06 NOTE — Patient Instructions (Addendum)
Continue abdominal binder, liberalizing salt in diet, encouraging fluids, and elevate head of bed.  Continue current medicines, try 1.5 tablets of midodrine first thing in the morning. If worsening slow heart rate, back down to 1 tablet only. Continue 5mg  (whole tablet) midodrine for other doses.  I will ask palliative care to come out to house again.

## 2023-06-06 NOTE — Progress Notes (Unsigned)
Ph: [phone] Fax: [phone]   Patient ID: Dylan [name], male    DOB: [birth year], 89 y.o.   MRN: [id number]  This visit was conducted in person.  BP (!) 164/70   Pulse (!) 53   Temp (!) 97.3 F (36.3 C) (Temporal)   Ht 6\' 1"  (1.854 m)   Wt 179 lb (81.2 kg)   SpO2 98%   BMI 23.62 kg/m   Orthostatic VS for the past 72 hrs (Last 3 readings):  Orthostatic BP Patient Position  06/06/23 1532 138/70 Standing  06/06/23 1529 156/72 Supine  170/72 on retesting (sitting)  CC: low BP readings  Subjective:   HPI: Dylan [name] is a 89 y.o. male presenting on 06/06/2023 for Low Blood Pressure (C/o low home BP readings, unsteady on feet and altered mental state. Sxs started 2-3 wks ago. Pt accompanied by wife, [name].)   1-2 wk h/o low blood pressures associated with imbalance, altered mental status, anorexia and vomiting. This is despite fludrocortisone 0.1mg  daily and midodrine 5mg  TID with meals. BP as low as 65 systolic on Sunday - vomited with this. Stayed home on Sunday. Monday BP 110/65 - stayed in bed all day saying "didn't feel good". Went to [facility] on Tuesday - unsteady there. Wednesday stayed in bed and slept all day, did eat better that day. Low BP episodes are predominantly first thing in the morning. Continues liberalizing salt in diet   No fevers/chills, abd pain, bowel changes.  Urinating less than normal.  Newly noted closing left eye, L hand grasped when resting.  Saw eye doctor - normal evaluation.   Known orthostasis with prior syncope due to autonomic instability. Rec continued abdominal binder. Trouble placing compression stockings.   From prior note: midodrine dosing - will dose first pill when he wakes up, discussed TID dosing Q3-4 hours during day, last dose to be taken at least 4 hours before bedtime. Treating to standing blood pressures given orthostasis.   Saw cardiology Dr Allyson Sabal for peripheral vascular disease evaluation - given  comorbidities and no critical limb ischemia, rec conservative management.   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 Therefore not on aricept (also concern for altered cardiac conduction side effect).      Relevant past medical, surgical, family and social history reviewed and updated as indicated. Interim medical history since our last visit reviewed. Allergies and medications reviewed and updated. Outpatient Medications Prior to Visit  Medication Sig Dispense Refill   amiodarone (PACERONE) 200 MG tablet TAKE 1 TABLET BY MOUTH TWICE DAILY FOR ONE MONTH. THEN TAKE ONE TABLET BY MOUTH ONCE DAILY THEREAFTER 90 tablet 3   aspirin EC 81 MG tablet Take 1 tablet (81 mg total) by mouth daily. Swallow whole.     Cholecalciferol (VITAMIN D3) 1000 units CAPS Take 1 capsule (1,000 Units total) by mouth daily. 30 capsule    DULoxetine (CYMBALTA) 20 MG capsule Take 2 capsules (40 mg total) by mouth daily. 60 capsule 3   fexofenadine (ALLEGRA ALLERGY) 180 MG tablet Take 1 tablet (180 mg total) by mouth daily.     finasteride (PROSCAR) 5 MG tablet Take 1 tablet by mouth once daily 90 tablet 3   fludrocortisone (FLORINEF) 0.1 MG tablet Take 1 tablet (0.1 mg total) by mouth daily. 30 tablet 0   guaiFENesin (MUCINEX) 600 MG 12 hr tablet Take 1,200 mg by mouth daily.     midodrine (PROAMATINE) 5  MG tablet Take 1 tablet (5 mg total) by mouth 3 (three) times daily with meals. 90 tablet 6   Polyethylene Glycol 3350 (MIRALAX PO) Take 17 g by mouth daily.     Probiotic Product (ALIGN PO) Take by mouth daily.     Sennosides (SENOKOT PO) Take 1 tablet by mouth at bedtime.     vitamin B-12 (V-R VITAMIN B-12) 500 MCG tablet Take 1 tablet (500 mcg total) by mouth daily. (Patient taking differently: Take 500 mcg by mouth at bedtime.)     atorvastatin (LIPITOR) 20 MG tablet Take 1 tablet (20 mg total) by mouth daily. 30 tablet 3   No  facility-administered medications prior to visit.     Per HPI unless specifically indicated in ROS section below Review of Systems  Objective:  BP (!) 164/70   Pulse (!) 53   Temp (!) 97.3 F (36.3 C) (Temporal)   Ht 6\' 1"  (1.854 m)   Wt 179 lb (81.2 kg)   SpO2 98%   BMI 23.62 kg/m   Wt Readings from Last 3 Encounters:  06/06/23 179 lb (81.2 kg)  04/28/23 181 lb (82.1 kg)  04/22/23 177 lb 8 oz (80.5 kg)      Physical Exam Vitals and nursing note reviewed.  Constitutional:      Appearance: Normal appearance. He is not ill-appearing.  Cardiovascular:     Rate and Rhythm: Normal rate and regular rhythm.     Pulses: Normal pulses.     Heart sounds: Normal heart sounds. No murmur heard. Pulmonary:     Effort: Pulmonary effort is normal. No respiratory distress.     Breath sounds: Normal breath sounds. No wheezing, rhonchi or rales.  Musculoskeletal:     Right lower leg: No edema.     Left lower leg: No edema.  Skin:    General: Skin is warm and dry.  Neurological:     General: No focal deficit present.     Mental Status: He is alert.     Comments:  5/5 strength BUE, BLE, grip strength intact No noted abnormal sensation  Ambulates slowed with assistance, chronic  Psychiatric:        Mood and Affect: Mood normal.        Behavior: Behavior normal.       Results for orders placed or performed during the hospital encounter of 04/19/23  CBC with Differential  Result Value Ref Range   WBC 5.6 4.0 - 10.5 K/uL   RBC 4.84 4.22 - 5.81 MIL/uL   Hemoglobin 15.8 13.0 - 17.0 g/dL   HCT 16.1 09.6 - 04.5 %   MCV 95.5 80.0 - 100.0 fL   MCH 32.6 26.0 - 34.0 pg   MCHC 34.2 30.0 - 36.0 g/dL   RDW 40.9 81.1 - 91.4 %   Platelets 115 (L) 150 - 400 K/uL   nRBC 0.0 0.0 - 0.2 %   Neutrophils Relative % 69 %   Neutro Abs 3.8 1.7 - 7.7 K/uL   Lymphocytes Relative 20 %   Lymphs Abs 1.1 0.7 - 4.0 K/uL   Monocytes Relative 9 %   Monocytes Absolute 0.5 0.1 - 1.0 K/uL   Eosinophils  Relative 1 %   Eosinophils Absolute 0.1 0.0 - 0.5 K/uL   Basophils Relative 1 %   Basophils Absolute 0.1 0.0 - 0.1 K/uL   Immature Granulocytes 0 %   Abs Immature Granulocytes 0.02 0.00 - 0.07 K/uL  Comprehensive metabolic panel  Result Value Ref Range  Sodium 138 135 - 145 mmol/L   Potassium 4.1 3.5 - 5.1 mmol/L   Chloride 103 98 - 111 mmol/L   CO2 27 22 - 32 mmol/L   Glucose, Bld 137 (H) 70 - 99 mg/dL   BUN 17 8 - 23 mg/dL   Creatinine, Ser 9.60 (H) 0.61 - 1.24 mg/dL   Calcium 8.9 8.9 - 45.4 mg/dL   Total Protein 6.5 6.5 - 8.1 g/dL   Albumin 3.8 3.5 - 5.0 g/dL   AST 25 15 - 41 U/L   ALT 24 0 - 44 U/L   Alkaline Phosphatase 74 38 - 126 U/L   Total Bilirubin 0.6 0.3 - 1.2 mg/dL   GFR, Estimated 45 (L) >60 mL/min   Anion gap 8 5 - 15  Troponin I (High Sensitivity)  Result Value Ref Range   Troponin I (High Sensitivity) 8 <18 ng/L  Troponin I (High Sensitivity)  Result Value Ref Range   Troponin I (High Sensitivity) 9 <18 ng/L    Assessment & Plan:   Problem List Items Addressed This Visit     DNR (do not resuscitate) (Chronic)   Relevant Orders   Amb Referral to Palliative Care   Dementia with behavioral disturbance (HCC) - Primary   Relevant Orders   Amb Referral to Palliative Care   Orthostatic syncope   Relevant Orders   Amb Referral to Palliative Care     Meds ordered this encounter  Medications   atorvastatin (LIPITOR) 20 MG tablet    Sig: Take 1 tablet (20 mg total) by mouth daily.    Dispense:  90 tablet    Refill:  1    Orders Placed This Encounter  Procedures   Amb Referral to Palliative Care    Referral Priority:   Routine    Referral Type:   Consultation    Referral Reason:   Symptom Managment    Number of Visits Requested:   1    Patient Instructions  Continue abdominal binder, liberalizing salt in diet, encouraging fluids, and elevate head of bed.  Continue current medicines, try 1.5 tablets of midodrine first thing in the morning. If  worsening slow heart rate, back down to 1 tablet only. Continue 5mg  (whole tablet) midodrine for other doses.  I will ask palliative care to come out to house again.   Follow up plan: Return if symptoms worsen or fail to improve.  Eustaquio Boyden, MD

## 2023-06-07 NOTE — Assessment & Plan Note (Addendum)
Recurrent difficulty despite current regimen of fludrocortisone 0.1mg  daily, midodrine 5mg  TID with meals, first dose upon awakening.  Will increase first midodrine dose to 7.5mg , update with effect.  Will also refer to home palliative care given increased frequency of syncope episodes, necessitating EMS activation.  Continue abdominal binder, liberalizing salt intake. Avoid compression stockings in PAD.

## 2023-06-07 NOTE — Assessment & Plan Note (Addendum)
Better controlled when on prozac which was changed to cymbalta earlier this year due to amiodarone interaction/possible QT prolongation.

## 2023-06-07 NOTE — Assessment & Plan Note (Signed)
Saw Dr Allyson Sabal - rec medical management

## 2023-06-07 NOTE — Assessment & Plan Note (Signed)
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 Continues [facility].  Saw neurology ([place]) 06/2022.

## 2023-06-07 NOTE — Assessment & Plan Note (Signed)
H/o parox aflutter with high PVC burden.  Now on amiodarone.  Has seen EP.

## 2023-06-15 ENCOUNTER — Other Ambulatory Visit: Payer: Self-pay | Admitting: Family Medicine

## 2023-06-15 DIAGNOSIS — I951 Orthostatic hypotension: Secondary | ICD-10-CM

## 2023-06-16 NOTE — Telephone Encounter (Signed)
Midodrine Last filled:  05/19/23, #90 Last OV:  06/06/23, low BP Next OV:  07/29/23, 3 o f/u

## 2023-06-26 ENCOUNTER — Encounter (INDEPENDENT_AMBULATORY_CARE_PROVIDER_SITE_OTHER): Payer: Self-pay

## 2023-07-02 IMAGING — CT CT HEAD W/O CM
4 series · 16 of 47 positions shown, 18 images · non-contrast
Comparison: CT head 03/17/2022

CLINICAL DATA: Head trauma.  Fell while getting out of bed.



[Series 2: head wo · axial · 0.45mm/px · z∈[-77,+43]mm · 7 of 34 slices shown, 9 images]
[im 5/34  brain]
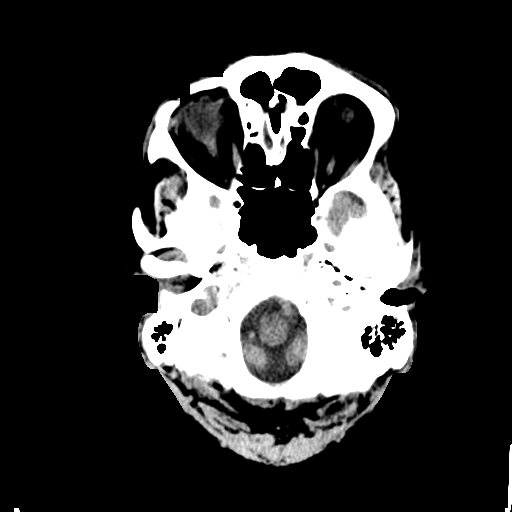
[im 5/34  bone]
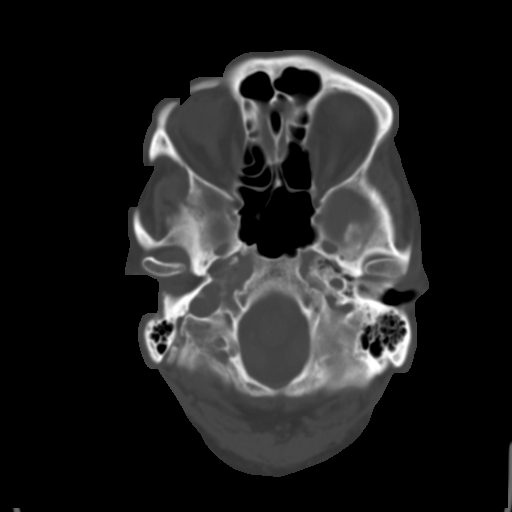
[im 9/34  brain]
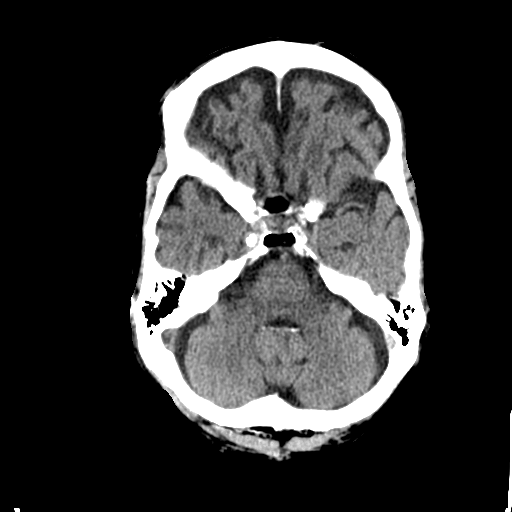
[im 13/34  brain]
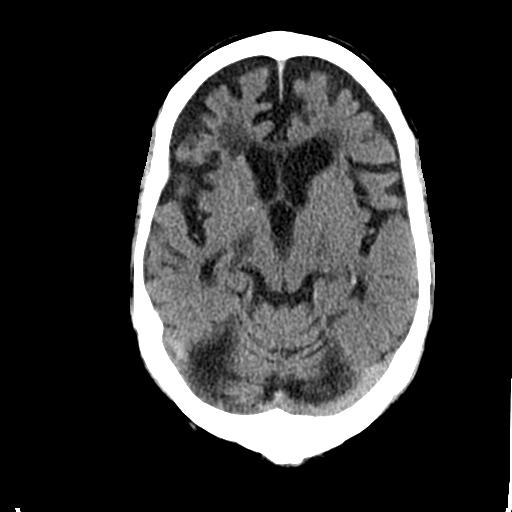
[im 17/34  brain]
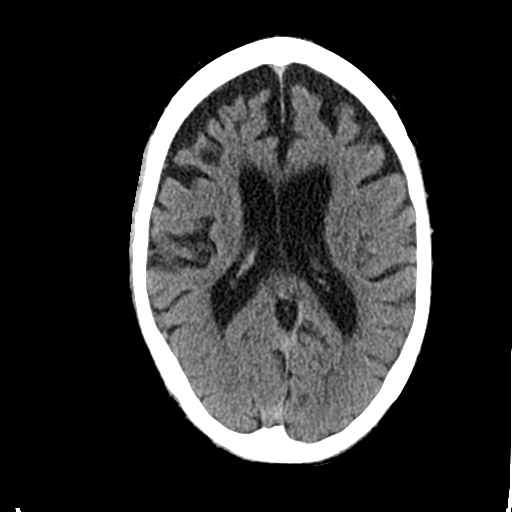
[im 21/34  brain]
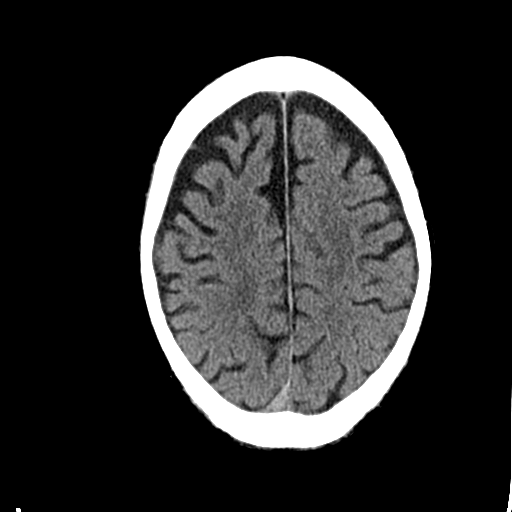
[im 21/34  bone]
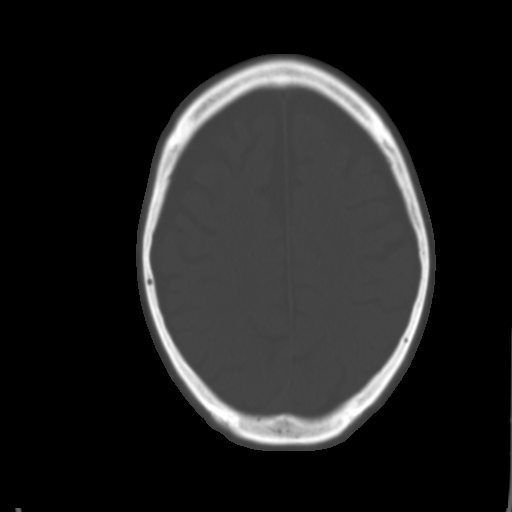
[im 25/34  brain]
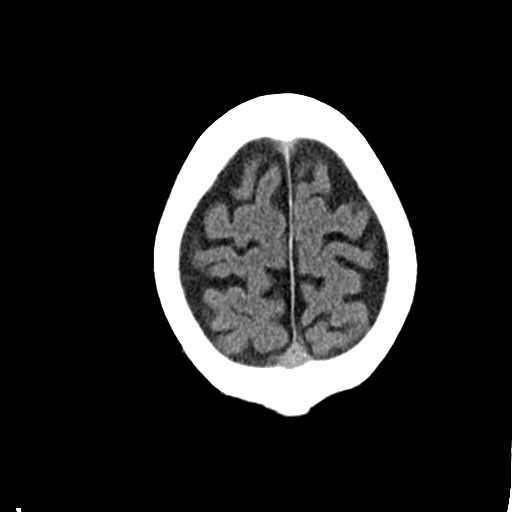
[im 29/34  brain]
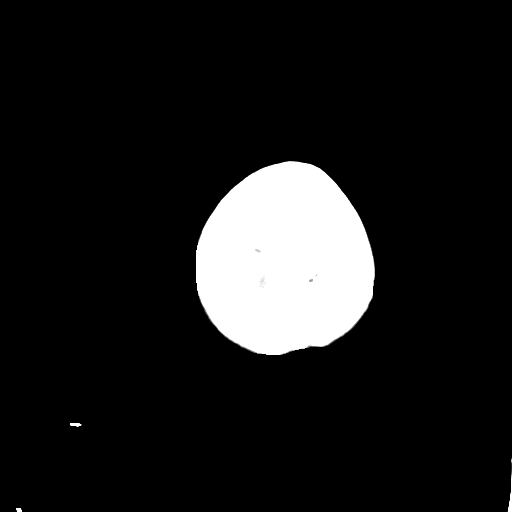

[Series 3: head bone · axial · 0.45mm/px · z∈[-81,-49]mm · 3 of 83 slices shown]
[im 9/83  bone]
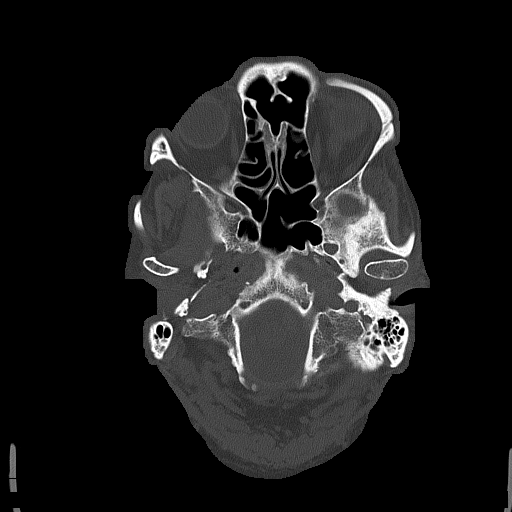
[im 17/83  bone]
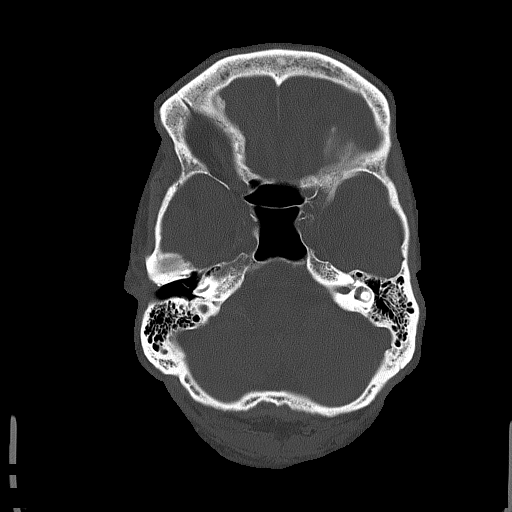
[im 25/83  bone]
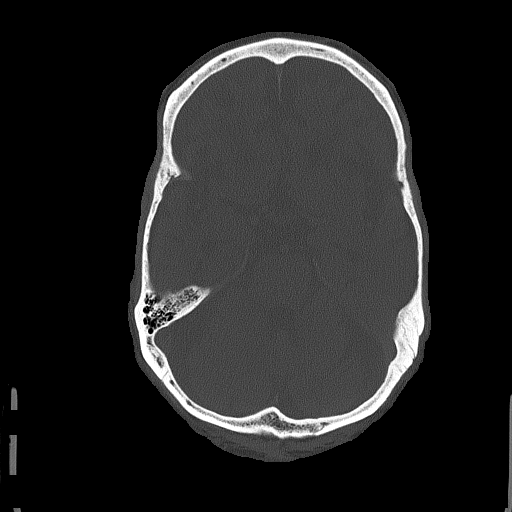

[Series 4: coronal soft tissue · coronal · 0.30mm/px · 3 of 71 slices shown]
[im 24/71  brain]
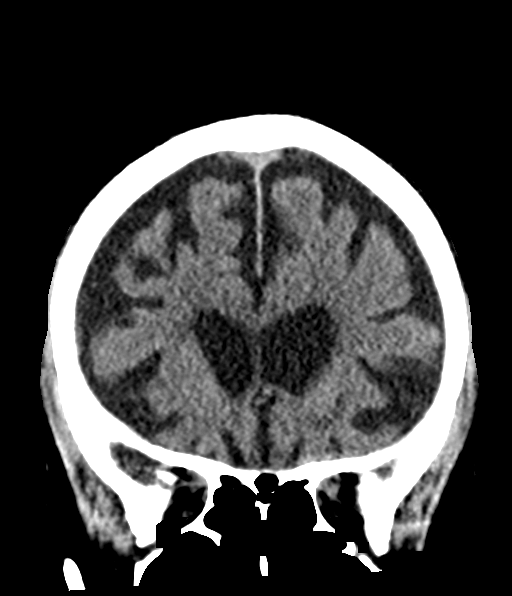
[im 32/71  brain]
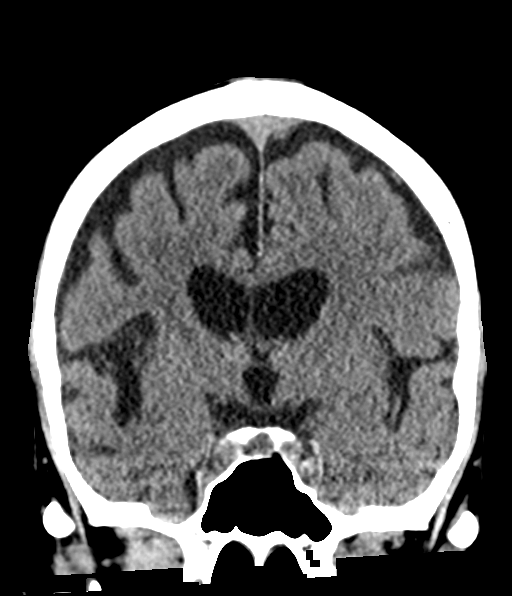
[im 39/71  brain]
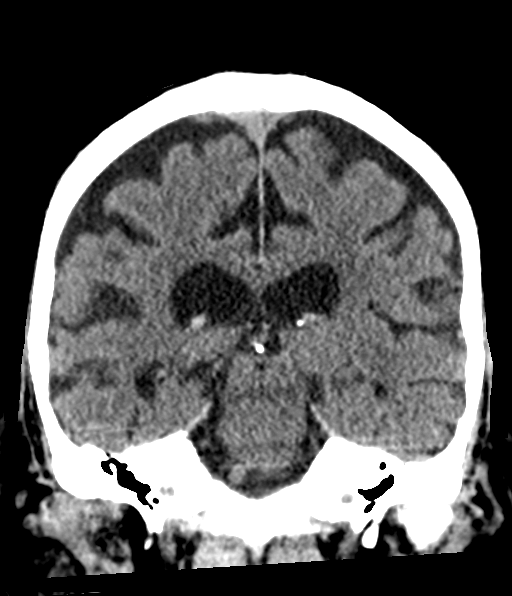

[Series 5: sagittal soft tissue · sagittal · 0.35mm/px · 3 of 51 slices shown]
[im 17/51  brain]
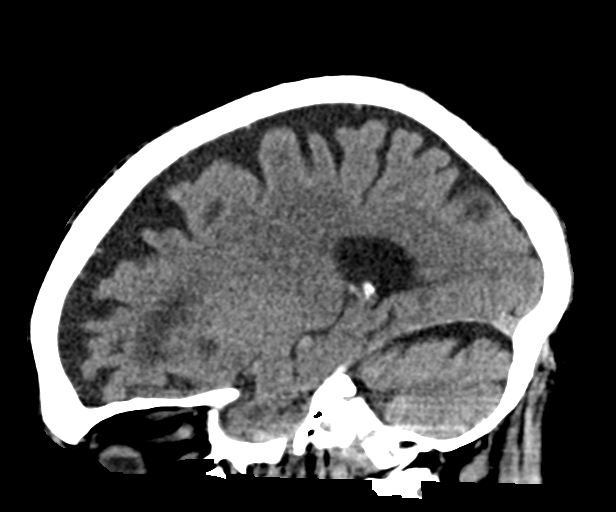
[im 26/51  brain]
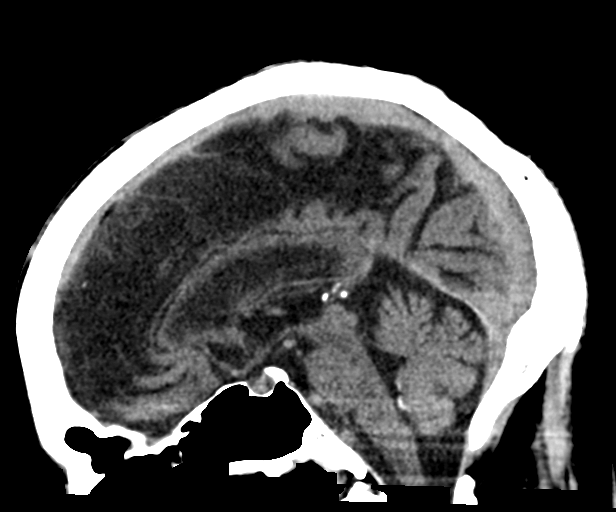
[im 34/51  brain]
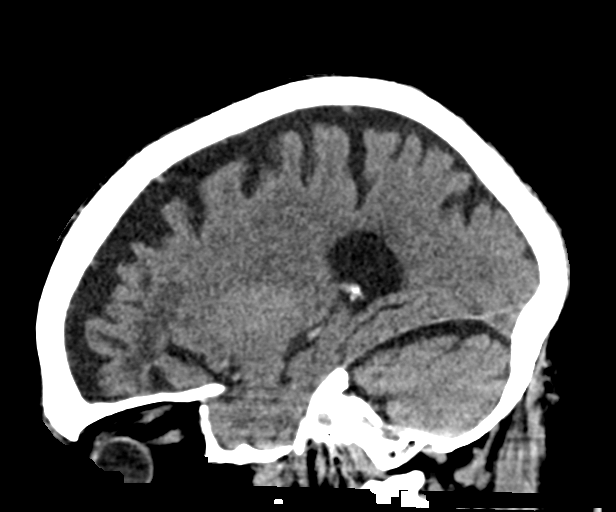

[16 of 47 positions shown; findings below may reference images not displayed]

FINDINGS: CT HEAD FINDINGS

Brain: No evidence of acute infarction, hemorrhage, hydrocephalus,
extra-axial collection or mass lesion/mass effect. Moderate severity
cerebral and cerebellar atrophy with increase extra-axial spaces and
ventricular dilatation. Unchanged. There is mild diffuse
low-attenuation within the periventricular white matter compatible
with chronic microvascular disease.

Vascular: No hyperdense vessel or unexpected calcification.

Skull: Normal. Negative for fracture or focal lesion.

Sinuses/Orbits: No acute finding.

Other: None.

CT CERVICAL SPINE FINDINGS

Alignment: No acute posttraumatic malalignment of the cervical
spine.

Skull base and vertebrae: No acute fracture. No primary bone lesion
or focal pathologic process.

Soft tissues and spinal canal: No prevertebral fluid or swelling. No
visible canal hematoma.

Disc levels: There is marked disc space narrowing and endplate
spurring identified at C6-7.

Upper chest: Negative.

Other: None
IMPRESSION: 1. No acute intracranial abnormality.
2. Chronic small vessel ischemic disease and cerebral atrophy.
3. No evidence for cervical spine fracture or subluxation.
4. Cervical degenerative disc disease.

## 2023-07-02 IMAGING — CT CT CERVICAL SPINE W/O CM
3 of 4 series · 12 of 33 positions shown, 14 images · non-contrast
Comparison: CT head 03/17/2022

CLINICAL DATA: Head trauma.  Fell while getting out of bed.



[Series 4: sagittal bone · sagittal · 0.33mm/px · 5 of 81 slices shown, 6 images]
[im 27/81  bone]
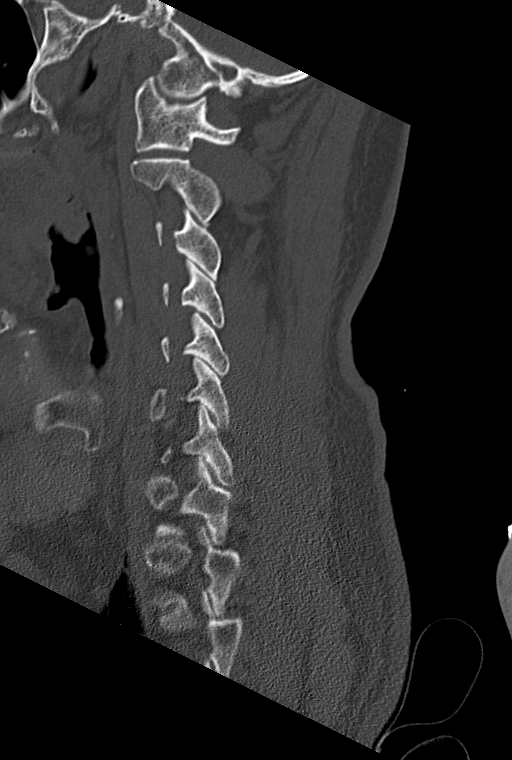
[im 34/81  bone]
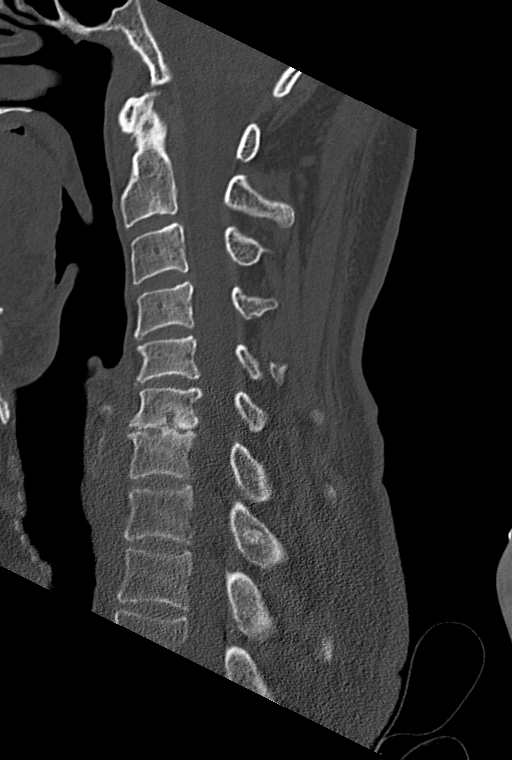
[im 41/81  soft-tissue]
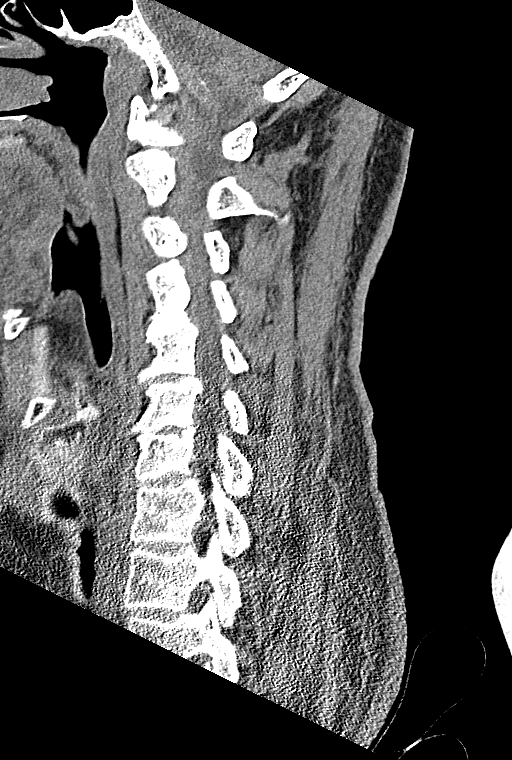
[im 41/81  bone]
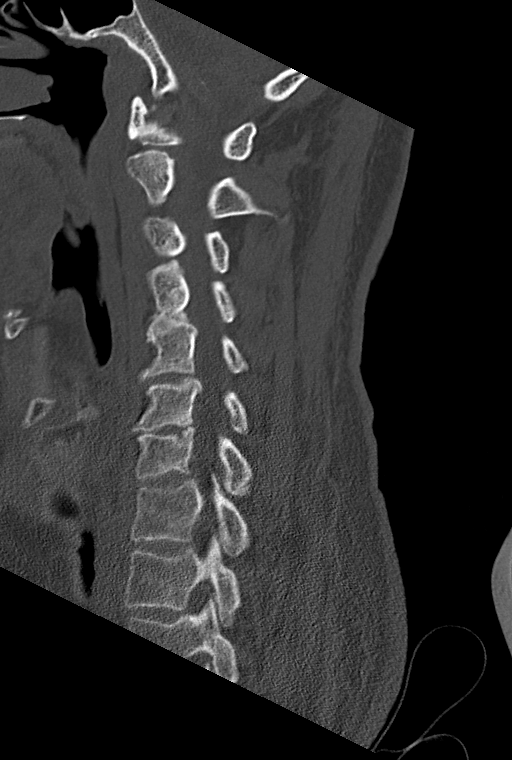
[im 47/81  bone]
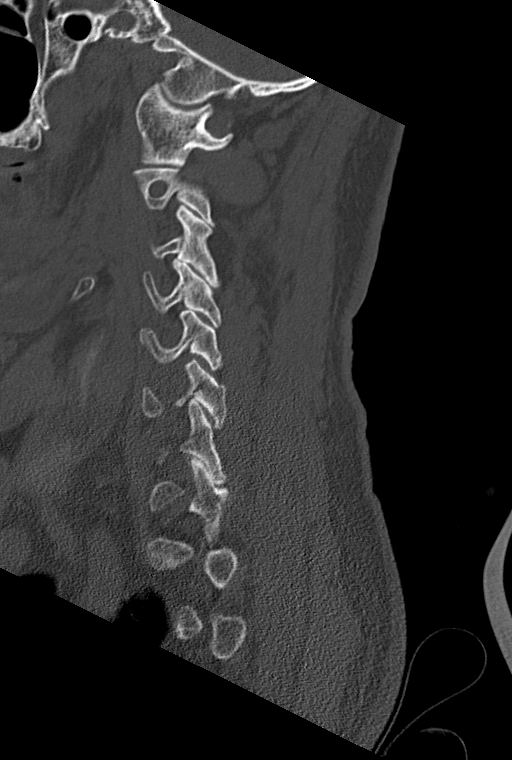
[im 54/81  bone]
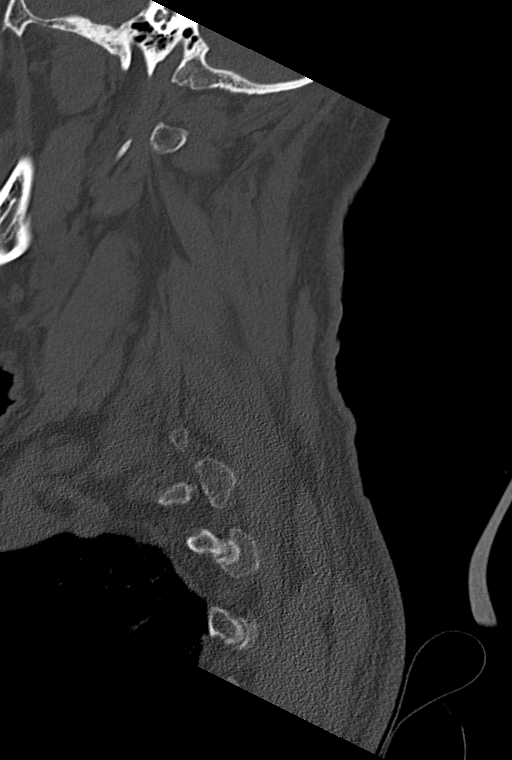

[Series 5: coronal bone · coronal · 0.31mm/px · 3 of 84 slices shown]
[im 26/84  bone]
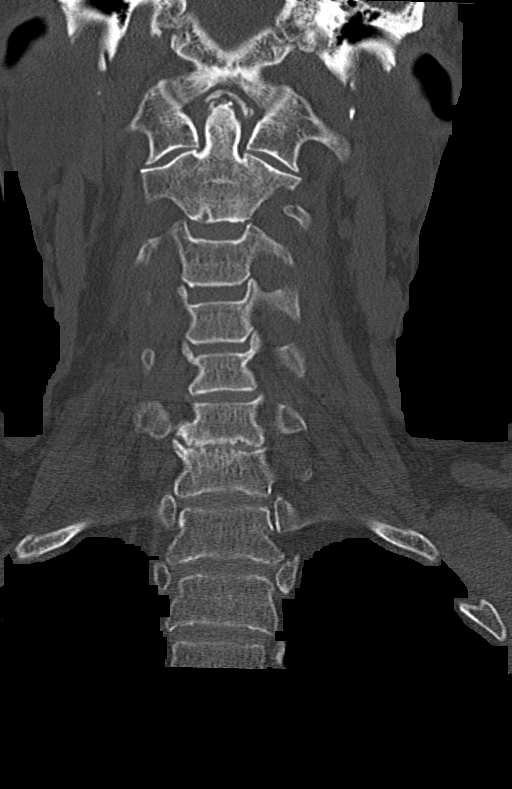
[im 37/84  bone]
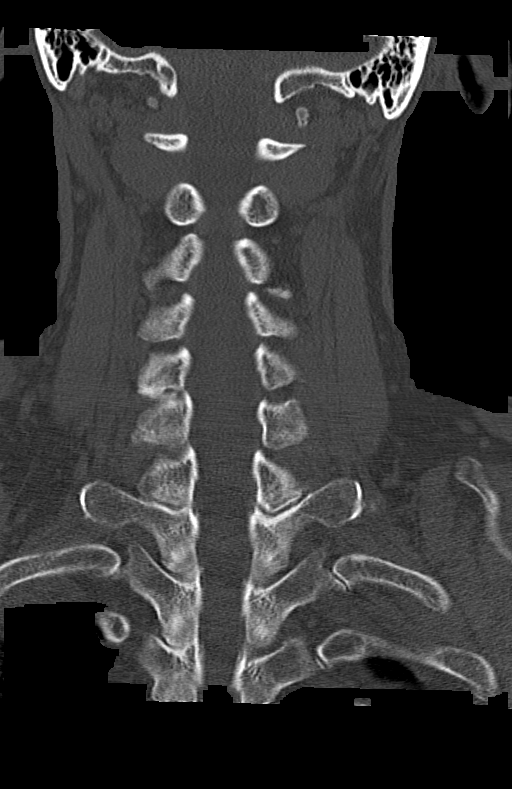
[im 48/84  bone]
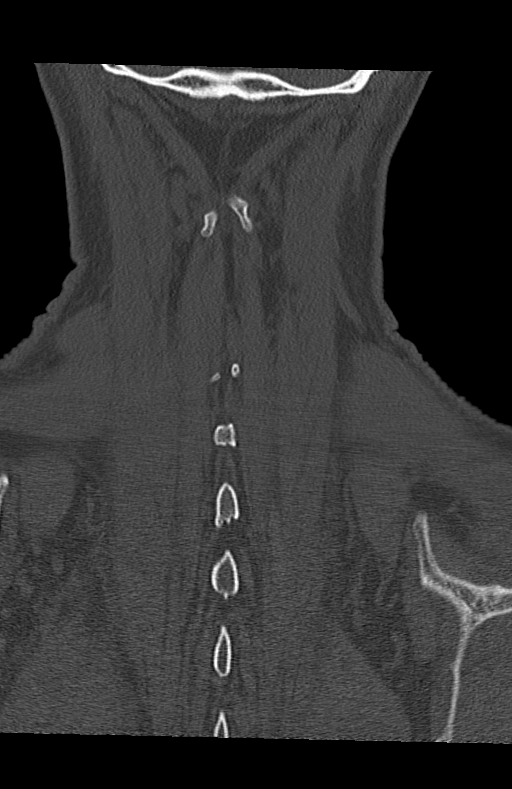

[Series 6: orthogonal bone · axial · 0.31mm/px · z∈[-271,-111]mm · 4 of 125 slices shown, 5 images]
[im 18/125  soft-tissue]
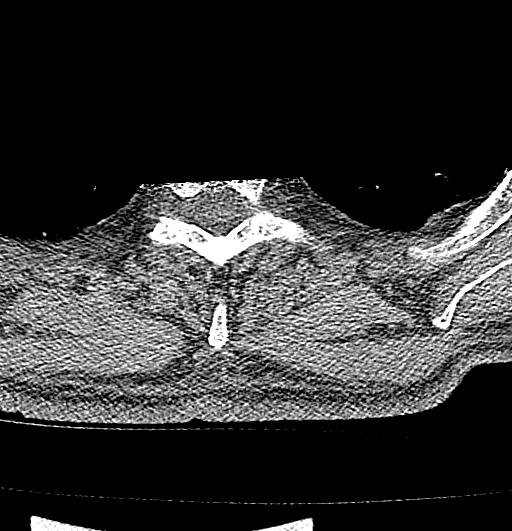
[im 18/125  bone]
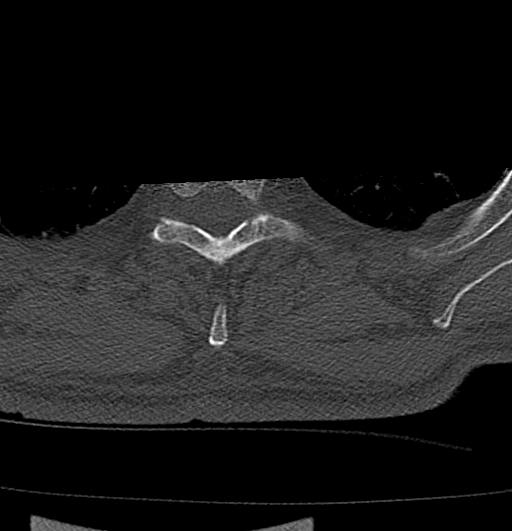
[im 54/125  bone]
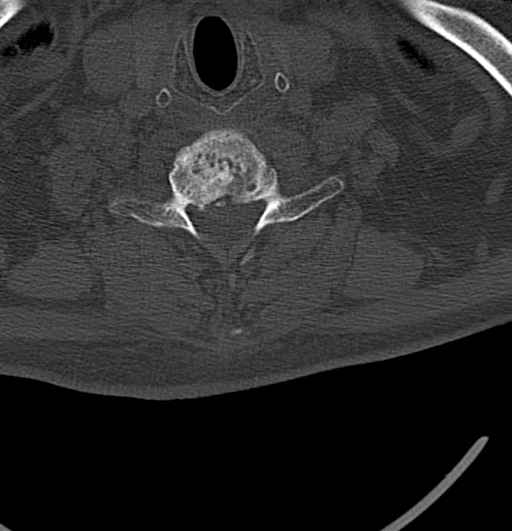
[im 71/125  bone]
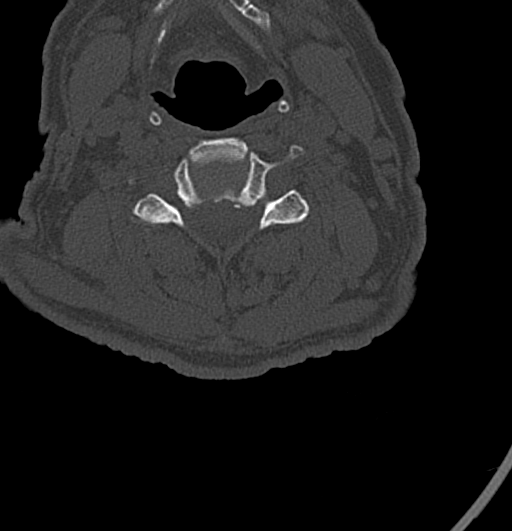
[im 107/125  bone]
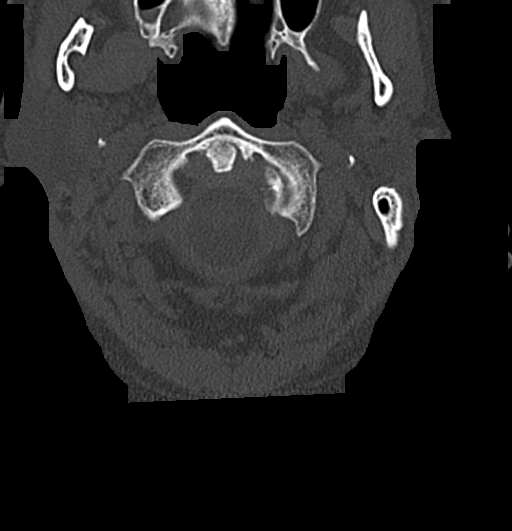

[12 of 33 positions shown; findings below may reference images not displayed]

FINDINGS: CT HEAD FINDINGS

Brain: No evidence of acute infarction, hemorrhage, hydrocephalus,
extra-axial collection or mass lesion/mass effect. Moderate severity
cerebral and cerebellar atrophy with increase extra-axial spaces and
ventricular dilatation. Unchanged. There is mild diffuse
low-attenuation within the periventricular white matter compatible
with chronic microvascular disease.

Vascular: No hyperdense vessel or unexpected calcification.

Skull: Normal. Negative for fracture or focal lesion.

Sinuses/Orbits: No acute finding.

Other: None.

CT CERVICAL SPINE FINDINGS

Alignment: No acute posttraumatic malalignment of the cervical
spine.

Skull base and vertebrae: No acute fracture. No primary bone lesion
or focal pathologic process.

Soft tissues and spinal canal: No prevertebral fluid or swelling. No
visible canal hematoma.

Disc levels: There is marked disc space narrowing and endplate
spurring identified at C6-7.

Upper chest: Negative.

Other: None
IMPRESSION: 1. No acute intracranial abnormality.
2. Chronic small vessel ischemic disease and cerebral atrophy.
3. No evidence for cervical spine fracture or subluxation.
4. Cervical degenerative disc disease.

## 2023-07-06 ENCOUNTER — Other Ambulatory Visit: Payer: Self-pay | Admitting: Physician Assistant

## 2023-07-13 ENCOUNTER — Other Ambulatory Visit: Payer: Self-pay | Admitting: Family Medicine

## 2023-07-13 DIAGNOSIS — G8929 Other chronic pain: Secondary | ICD-10-CM

## 2023-07-13 DIAGNOSIS — I951 Orthostatic hypotension: Secondary | ICD-10-CM

## 2023-07-16 NOTE — Telephone Encounter (Signed)
ERx 

## 2023-07-16 NOTE — Telephone Encounter (Signed)
Cymbalta last filled:  06/16/23, #60 Midodrine last filled:  06/18/23, #90 Last OV:  06/06/23, orthostatic syncope Next OV:  07/29/23, 3 mo f/u

## 2023-07-29 ENCOUNTER — Encounter: Payer: Self-pay | Admitting: Family Medicine

## 2023-07-29 ENCOUNTER — Ambulatory Visit (INDEPENDENT_AMBULATORY_CARE_PROVIDER_SITE_OTHER): Payer: Medicare Other | Admitting: Family Medicine

## 2023-07-29 VITALS — BP 140/76 | HR 54 | Temp 97.5°F | Ht 73.0 in | Wt 172.0 lb

## 2023-07-29 DIAGNOSIS — I951 Orthostatic hypotension: Secondary | ICD-10-CM | POA: Diagnosis not present

## 2023-07-29 DIAGNOSIS — F03918 Unspecified dementia, unspecified severity, with other behavioral disturbance: Secondary | ICD-10-CM | POA: Diagnosis not present

## 2023-07-29 NOTE — Assessment & Plan Note (Signed)
Chronic, stable period on florinef 0.1mg  daily with midodrine 5mg  TID, with occasional 7.5mg  am dose as needed. Continue this.

## 2023-07-29 NOTE — Patient Instructions (Addendum)
Return for physicalwellness visit, prior for fasting labs over the next few months.  Continue current medicines.  Consider shingrix shot.

## 2023-07-29 NOTE — Progress Notes (Signed)
Ph: [phone] Fax: [phone]   Patient ID: Dylan [name], male    DOB: 02-25-1934, 89 y.o.   MRN: [id number]  This visit was conducted in person.  BP (!) 140/76   Pulse (!) 54   Temp (!) 97.5 F (36.4 C) (Temporal)   Ht 6\' 1"  (1.854 m)   Wt 172 lb (78 kg)   SpO2 95%   BMI 22.69 kg/m   BP Readings from Last 3 Encounters:  07/29/23 (!) 140/76  06/06/23 (!) 164/70  04/28/23 136/64   CC: 2 mo f/u visit  Subjective:   HPI: Dylan [name] is a 89 y.o. male presenting on 07/29/2023 for Medical Management of Chronic Issues   See prior note for details.   Known orthostasis with prior syncope due to autonomic instability. Rec continued abdominal binder, continued increased salt intake. Currently managed with fludrocortisone 0.1mg  daily, midodrine 5mg  TID with meals, first dose upon awakening. Last visit we increased first midodrine dose to 7.5mg  (using higher dose intermittently).   Also referred to palliative care - nurse and coordinator came out 07/17/2023, they have not heard back since.    Saw cardiology Dr Allyson Sabal for peripheral vascular disease evaluation - given comorbidities and no critical limb ischemia, rec conservative management.   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 Therefore not on aricept (also concern for altered cardiac conduction side effect).   Continues [facility] 9 lb weight loss in the past 3 months although wife endorses good appetite.  Will return for CPE.      Relevant past medical, surgical, family and social history reviewed and updated as indicated. Interim medical history since our last visit reviewed. Allergies and medications reviewed and updated. Outpatient Medications Prior to Visit  Medication Sig Dispense Refill   amiodarone (PACERONE) 200 MG tablet TAKE 1 TABLET BY MOUTH TWICE DAILY FOR ONE MONTH. THEN TAKE ONE TABLET BY MOUTH ONCE DAILY  THEREAFTER 90 tablet 3   aspirin EC 81 MG tablet Take 1 tablet (81 mg total) by mouth daily. Swallow whole.     atorvastatin (LIPITOR) 20 MG tablet Take 1 tablet (20 mg total) by mouth daily. 90 tablet 1   Cholecalciferol (VITAMIN D3) 1000 units CAPS Take 1 capsule (1,000 Units total) by mouth daily. 30 capsule    DULoxetine (CYMBALTA) 20 MG capsule Take 2 capsules by mouth once daily 60 capsule 3   fexofenadine (ALLEGRA ALLERGY) 180 MG tablet Take 1 tablet (180 mg total) by mouth daily.     finasteride (PROSCAR) 5 MG tablet Take 1 tablet by mouth once daily 90 tablet 3   fludrocortisone (FLORINEF) 0.1 MG tablet Take 1 tablet by mouth once daily 90 tablet 2   guaiFENesin (MUCINEX) 600 MG 12 hr tablet Take 1,200 mg by mouth daily.     midodrine (PROAMATINE) 5 MG tablet TAKE 1 TABLET BY MOUTH THREE TIMES DAILY WITH MEALS 90 tablet 3   Polyethylene Glycol 3350 (MIRALAX PO) Take 17 g by mouth daily.     Probiotic Product (ALIGN PO) Take by mouth daily.     Sennosides (SENOKOT PO) Take 1 tablet by mouth at bedtime.     vitamin B-12 (V-R VITAMIN B-12) 500 MCG tablet Take 1 tablet (500 mcg total) by mouth daily. (Patient taking differently: Take 500 mcg by mouth at bedtime.)     No facility-administered medications prior to visit.     Per HPI unless specifically  indicated in ROS section below Review of Systems  Objective:  BP (!) 140/76   Pulse (!) 54   Temp (!) 97.5 F (36.4 C) (Temporal)   Ht 6\' 1"  (1.854 m)   Wt 172 lb (78 kg)   SpO2 95%   BMI 22.69 kg/m   Wt Readings from Last 3 Encounters:  07/29/23 172 lb (78 kg)  06/06/23 179 lb (81.2 kg)  04/28/23 181 lb (82.1 kg)      Physical Exam Vitals and nursing note reviewed.  Constitutional:      Appearance: Normal appearance. He is not ill-appearing.  HENT:     Head: Normocephalic and atraumatic.  Eyes:     Extraocular Movements: Extraocular movements intact.     Conjunctiva/sclera: Conjunctivae normal.     Pupils: Pupils are  equal, round, and reactive to light.  Cardiovascular:     Rate and Rhythm: Normal rate and regular rhythm.     Pulses: Normal pulses.     Heart sounds: Normal heart sounds. No murmur heard. Pulmonary:     Effort: Pulmonary effort is normal. No respiratory distress.     Breath sounds: Normal breath sounds. No wheezing, rhonchi or rales.  Musculoskeletal:     Right lower leg: No edema.     Left lower leg: No edema.  Skin:    General: Skin is warm and dry.     Findings: No rash.  Neurological:     Mental Status: He is alert.  Psychiatric:        Mood and Affect: Mood normal.        Behavior: Behavior normal.       Results for orders placed or performed during the hospital encounter of 04/19/23  CBC with Differential  Result Value Ref Range   WBC 5.6 4.0 - 10.5 K/uL   RBC 4.84 4.22 - 5.81 MIL/uL   Hemoglobin 15.8 13.0 - 17.0 g/dL   HCT 40.9 81.1 - 91.4 %   MCV 95.5 80.0 - 100.0 fL   MCH 32.6 26.0 - 34.0 pg   MCHC 34.2 30.0 - 36.0 g/dL   RDW 78.2 95.6 - 21.3 %   Platelets 115 (L) 150 - 400 K/uL   nRBC 0.0 0.0 - 0.2 %   Neutrophils Relative % 69 %   Neutro Abs 3.8 1.7 - 7.7 K/uL   Lymphocytes Relative 20 %   Lymphs Abs 1.1 0.7 - 4.0 K/uL   Monocytes Relative 9 %   Monocytes Absolute 0.5 0.1 - 1.0 K/uL   Eosinophils Relative 1 %   Eosinophils Absolute 0.1 0.0 - 0.5 K/uL   Basophils Relative 1 %   Basophils Absolute 0.1 0.0 - 0.1 K/uL   Immature Granulocytes 0 %   Abs Immature Granulocytes 0.02 0.00 - 0.07 K/uL  Comprehensive metabolic panel  Result Value Ref Range   Sodium 138 135 - 145 mmol/L   Potassium 4.1 3.5 - 5.1 mmol/L   Chloride 103 98 - 111 mmol/L   CO2 27 22 - 32 mmol/L   Glucose, Bld 137 (H) 70 - 99 mg/dL   BUN 17 8 - 23 mg/dL   Creatinine, Ser 0.86 (H) 0.61 - 1.24 mg/dL   Calcium 8.9 8.9 - 57.8 mg/dL   Total Protein 6.5 6.5 - 8.1 g/dL   Albumin 3.8 3.5 - 5.0 g/dL   AST 25 15 - 41 U/L   ALT 24 0 - 44 U/L   Alkaline Phosphatase 74 38 - 126 U/L   Total  Bilirubin 0.6 0.3 - 1.2 mg/dL   GFR, Estimated 45 (L) >60 mL/min   Anion gap 8 5 - 15  Troponin I (High Sensitivity)  Result Value Ref Range   Troponin I (High Sensitivity) 8 <18 ng/L  Troponin I (High Sensitivity)  Result Value Ref Range   Troponin I (High Sensitivity) 9 <18 ng/L    Assessment & Plan:   Problem List Items Addressed This Visit     Dementia with behavioral disturbance (HCC)   Orthostatic hypotension - Primary    Chronic, stable period on florinef 0.1mg  daily with midodrine 5mg  TID, with occasional 7.5mg  am dose as needed. Continue this.         No orders of the defined types were placed in this encounter.   No orders of the defined types were placed in this encounter.   Patient Instructions  Return for physicalwellness visit, prior for fasting labs over the next few months.  Continue current medicines.  Consider shingrix shot.   Follow up plan: Return in about 8 weeks (around 09/23/2023), or if symptoms worsen or fail to improve.  Eustaquio Boyden, MD

## 2023-08-17 NOTE — Progress Notes (Deleted)
Cardiology Office Note Date:  08/17/2023  Patient ID:  Dylan [name] Sep 22, 1934, MRN [id number] PCP:  Eustaquio Boyden, MD  Cardiologist:  Dr. Dulce Sellar EP: Dr. Elberta Fortis   Chief Complaint:  *** 6 month  History of Present Illness: Dylan [name] is a 89 y.o. male with history of HLD, dementia, CKD (III), RBBB and syncope > ILR implant  He was admitted to [facility]/31/20 with acute onset of worsening generalized weakness with associated mild dyspnea. In the ER, VSS WBC 23.1, lactic acid 3.9, mild bump in Creat 1.59 (baselin 1.18) Started on IFV and antibiotics Suspect pneumonia Discharged on PO antibiotics and started on midodrine as well Discharged 06/02/20.  Hospitalized 03/16/22 - 03/18/22 with c/o weakness, N/V/D, fever, found w/COVID  Hospitalized 04/16/22 - 04/23/22 after a syncopal event (x2), suspect orthostatic as major contributor, cardiology participated in his care, loop was interrogated with no HR/rhythm findings to explain syncope. He was treated with fluids, and midodrine increased. His Eliquis stopped with concerns of recurrent falls/syncope, traumatic bleeding, this to be re-addressed by primary cardiology team at follow up  He was reported to have HRs to 30's by pulse ox,BP cuff, felt to be 2/2 PVCs   Hospitalized 05/16/22 - 05/17/22 with observed bradycardia and low BPs. ? No symptoms.  Observed to have PVCs and this felt to be the cause of observed brady rates at home, florinef added to his regime. Cardiology consulted, no particular recommendations, agreed with florinef.  Has been following in our office all along seeing myself and Dr. Elberta Fortis  I saw him 07/18/22, loop RRT No brady or pauses He is accompanied by his wife today Since being started on Seroquel and increased to full tab daily, his BP has imroved quite a bit. This particular regime seems to be doing pretty well His dementia not particularly improved by it but BP has steadied out. His wife gets HRs at times in  the 30's but recalled that the extra beats can make that number low, usually a recheck after meds and or so is better. He had not had any lethargy, unusual AMS, he has not had any falls or faints since our last visit. With the Seroquel and improved BP he has not had to wear the abdominal binder. She has an alarm o his bed now and their home regime is getting smoother. He goes to adult day care 3d/week, gives her a couple hours these day to get stuff done. We discussed importance of her taking care of herself  as well. He denies any symptoms She concurs, no noted cardiac issues/concerns No SOB Patient's wife remained most comfortable OFF/without a/c Orthostatic BP felt to be the bigger problem then his PVCs No changes were made  Seeing Dr. Elberta Fortis a few times since then   Started on amiodarone for his PVCs Last was 02/20/23, doing better, no PVCs on EKG/rhythm strip, planned for surveillance labs  ER visit 04/19/23 for syncope, occurred in the shower, SBP 90's, given his home midodrine and IVF Vitals mention HR 40's EKG was SB 45bpm, (no PVCs), RBBB In d/w cards, syncope more likely his BP then bradycardiaw/recs to increase midodrine if need  Seeing Dr. Allyson Sabal for his PAD, mostly sedentary, RLE pain of his entire leg, not c/w claudication, vascular cause.  Not planned for intervention   *** reduce amio...  ?? On any nodal blocker? *** any more syncope? *** HR?  Device information MDT LNQII  Implanted 09/30/2018 for syncope EOS  Past  Medical History:  Diagnosis Date   Basal cell carcinoma 08/31/2020   Mid forehead - ED&C    Basal cell carcinoma of right nasal sidewall 12/15/2014   Bilateral hearing loss 08/03/2018   S/p audiology evaluation, has new hearing aides.   Bradycardia    a. 08/2018 ETT: Poor ex tolerance (2:10); b. 09/2018 s/p MDT Linq.   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 09/2018 s/p MDT Linq; b. 10/2021 Echo: EF 55-60%, no rwma, nl RV fxn, mild MR, triv AI.   Thrombocytopenia (HCC) 02/21/2011   periph smear stable 02/2020   Vitamin B12 deficiency 08/03/2018   Completed b12 shots 09/2018, maintaining levels on oral replacement (dissolvable tablets) 01/2019   Vitamin D deficiency 05/19/2018    Past Surgical History:  Procedure Laterality Date   BASAL CELL CARCINOMA EXCISION Right 01/2015   with reconstructive surgery around eye   CATARACT EXTRACTION Left 03/2017   ETT  09/2018   negative for ischemia, extremely poor exercise tolerance ([place])   FOOT SURGERY Left 1998   metatarsal amputation after injury   KNEE ARTHROSCOPY  1993   Right   KNEE ARTHROSCOPY  01/2001   Left   LOOP RECORDER INSERTION N/A 09/30/2018   Procedure: LOOP RECORDER INSERTION;  Surgeon: Regan Lemming, MD;  Location: [facility];  Service: Cardiovascular;  Laterality: N/A;    Current Outpatient Medications  Medication Sig Dispense Refill   amiodarone (PACERONE) 200 MG tablet TAKE 1 TABLET BY MOUTH TWICE DAILY FOR ONE MONTH. THEN TAKE ONE TABLET BY MOUTH ONCE DAILY THEREAFTER 90 tablet 3   aspirin EC 81 MG tablet Take 1 tablet (81 mg total) by mouth daily. Swallow whole.     atorvastatin (LIPITOR) 20 MG tablet Take 1 tablet (20 mg total) by mouth daily. 90 tablet 1   Cholecalciferol (VITAMIN D3) 1000 units CAPS Take 1 capsule (1,000 Units total) by mouth daily. 30 capsule    DULoxetine (CYMBALTA) 20 MG capsule Take 2 capsules by mouth once daily 60 capsule 3   fexofenadine (ALLEGRA ALLERGY) 180 MG tablet Take 1 tablet (180 mg total) by mouth daily.     finasteride (PROSCAR) 5 MG tablet Take 1 tablet by mouth once daily 90 tablet 3   fludrocortisone (FLORINEF) 0.1 MG tablet Take 1 tablet by mouth once daily 90  tablet 2   guaiFENesin (MUCINEX) 600 MG 12 hr tablet Take 1,200 mg by mouth daily.     midodrine (PROAMATINE) 5 MG tablet TAKE 1 TABLET BY MOUTH THREE TIMES DAILY WITH MEALS 90 tablet 3   Polyethylene Glycol 3350 (MIRALAX PO) Take 17 g by mouth daily.     Probiotic Product (ALIGN PO) Take by mouth daily.     Sennosides (SENOKOT PO) Take 1 tablet by mouth at bedtime.     vitamin B-12 (V-R VITAMIN B-12) 500 MCG tablet Take 1 tablet (500 mcg total) by mouth daily. (Patient taking differently: Take 500 mcg by mouth at bedtime.)     No current facility-administered medications for this visit.    Allergies:   Doxepin, Seroquel [quetiapine], and Lincomycin hcl   Social History:  The patient  reports that he quit smoking about 62 years ago. His smoking use included cigarettes. He has never used smokeless tobacco. He reports that he does not drink alcohol and does not use drugs.   Family History:  The patient's family history includes Cataracts in his sister; Congestive Heart Failure in his father and mother;  Leukemia in his brother; Stroke (age of onset: 79) in his father.  ROS:  Please see the history of present illness. All other systems are reviewed and otherwise negative.   PHYSICAL EXAM:  VS:  There were no vitals taken for this visit. BMI: There is no height or weight on file to calculate BMI. Well nourished, well developed, in no acute distress  HEENT: normocephalic, atraumatic  Neck: no JVD, carotid bruits or masses Cardiac: *** RRR, a few extrasystoles with an prolonged ausculation, no significant murmurs, no rubs, or gallops Lungs:  *** CTA b/l, no wheezing, rhonchi or rales  Abd: soft, nontender MS: no deformity, age appropriate, perhaps advanced atrophy Ext: *** no edema  Skin: warm and dry, no rash Neuro:  known advanced dementia,  No gross focal motor deficits appreciated Psych: euthymic mood, full affect  ILR site *** is stable, no tethering or discomfort   EKG:  not done today   11/08/2021: TTE  1. Left ventricular ejection fraction, by estimation, is 55 to 60%. Left  ventricular ejection fraction by 2D MOD biplane is 59.7 %. The left  ventricle has normal function. The left ventricle has no regional wall  motion abnormalities. Left ventricular  diastolic parameters were normal.   2. Right ventricular systolic function is normal. The right ventricular  size is normal.   3. The mitral valve is normal in structure. Mild mitral valve  regurgitation.   4. The aortic valve was not well visualized. Aortic valve regurgitation  is trivial.   5. The inferior vena cava is normal in size with greater than 50%  respiratory variability, suggesting right atrial pressure of 3 mmHg.   09/04/2018: ETT Blood pressure demonstrated a normal response to exercise. Clinically and electrically negative for ischemia Extremelly poor exercise tolerance Dr. Elberta Fortis commented:  Poor exercise tolerance.  No evidence of chronotropic incompetence.  Will need Linq monitoring for episode of  syncope.   14 day monitor Aug 2019  Indication, syncope   Baseline rhythm, sinus minimum maximum and average rate 44 136 and 64 bpm   Supraventricular ectopy, less than 1% there were frequent 297 runs of APCs longest lasting 11.2 seconds with a rate of 122 bpm the fastest 5 complexes a rate of 169 bpm.  There are no episodes of atrial fibrillation or flutter.   Ventricular ectopy, rare isolated  APCs 5 couplets and one triplet less than 1% burden. There were no episodes of sinus node or AV nodal block There are no symptomatic events Conclusion brief runs of atrial premature contractions   Recent Labs: 09/30/2022: B Natriuretic Peptide 471.9 02/20/2023: TSH 1.900 04/19/2023: ALT 24; BUN 17; Creatinine, Ser 1.49; Hemoglobin 15.8; Platelets 115; Potassium 4.1; Sodium 138  No results found for requested labs within last 365 days.   CrCl cannot be calculated (Patient's most recent lab result is older than the maximum 21 days allowed.).   Wt Readings from Last 3 Encounters:  07/29/23 172 lb (78 kg)  06/06/23 179 lb (81.2 kg)  04/28/23 181 lb (82.1 kg)     Other studies reviewed: Additional studies/records reviewed today include: summarized above  ASSESSMENT AND PLAN:  1. Syncope     Baseline conduction system disease, RBBB, LAD     Device is RRT      By prior device interrogations,  No true brady or pause episodes     Orthostatic hypotension has been demonstrated, now on midodrine and florinef     The addition of Seroquel to his regime has added his BP has had further improvement  Re-discussed treatment strategies at length His wife is doing a great job Hard with his advancing dementia. Will continue to try and encourage hydration   We have previously discussed dilemma between treating PVC and his low BP's/orthostatic hypotension I think the orthostatic issue is the bigger problem *** Try to hold off on meds for the PVCs    2. Aflutter CHA2DS2Vasc is 2 We have previously  discussed stroke risk with Afib and fall/traumatic bleeding risk Revisited this today, *** His wife remains most comfortable as well staying off eliquis for now    Disposition: *** sooner if needed     Current medicines are reviewed at length with the patient today.  The patient did not have any concerns regarding medicines.  Norma Fredrickson, PA-C 08/17/2023 10:08 AM     [facility] [address] [place] [address] [phone] (office)  [phone] (fax)

## 2023-08-19 ENCOUNTER — Other Ambulatory Visit: Payer: Self-pay

## 2023-08-19 ENCOUNTER — Ambulatory Visit: Payer: Medicare Other | Admitting: Physician Assistant

## 2023-08-19 ENCOUNTER — Observation Stay
Admission: EM | Admit: 2023-08-19 | Discharge: 2023-08-20 | Disposition: A | Discharge status: Home / Self Care | Payer: Medicare Other | Attending: Internal Medicine | Admitting: Internal Medicine

## 2023-08-19 DIAGNOSIS — Z79899 Other long term (current) drug therapy: Secondary | ICD-10-CM | POA: Diagnosis not present

## 2023-08-19 DIAGNOSIS — I4510 Unspecified right bundle-branch block: Secondary | ICD-10-CM | POA: Diagnosis not present

## 2023-08-19 DIAGNOSIS — M6281 Muscle weakness (generalized): Secondary | ICD-10-CM | POA: Insufficient documentation

## 2023-08-19 DIAGNOSIS — Z8616 Personal history of COVID-19: Secondary | ICD-10-CM | POA: Diagnosis not present

## 2023-08-19 DIAGNOSIS — Z7982 Long term (current) use of aspirin: Secondary | ICD-10-CM | POA: Diagnosis not present

## 2023-08-19 DIAGNOSIS — D696 Thrombocytopenia, unspecified: Secondary | ICD-10-CM | POA: Diagnosis not present

## 2023-08-19 DIAGNOSIS — I129 Hypertensive chronic kidney disease with stage 1 through stage 4 chronic kidney disease, or unspecified chronic kidney disease: Secondary | ICD-10-CM | POA: Insufficient documentation

## 2023-08-19 DIAGNOSIS — Z8673 Personal history of transient ischemic attack (TIA), and cerebral infarction without residual deficits: Secondary | ICD-10-CM | POA: Diagnosis not present

## 2023-08-19 DIAGNOSIS — R2681 Unsteadiness on feet: Secondary | ICD-10-CM | POA: Insufficient documentation

## 2023-08-19 DIAGNOSIS — F0390 Unspecified dementia without behavioral disturbance: Secondary | ICD-10-CM | POA: Insufficient documentation

## 2023-08-19 DIAGNOSIS — I493 Ventricular premature depolarization: Secondary | ICD-10-CM | POA: Diagnosis present

## 2023-08-19 DIAGNOSIS — R1111 Vomiting without nausea: Secondary | ICD-10-CM | POA: Diagnosis not present

## 2023-08-19 DIAGNOSIS — R55 Syncope and collapse: Principal | ICD-10-CM | POA: Diagnosis present

## 2023-08-19 DIAGNOSIS — Z87891 Personal history of nicotine dependence: Secondary | ICD-10-CM | POA: Insufficient documentation

## 2023-08-19 DIAGNOSIS — Z743 Need for continuous supervision: Secondary | ICD-10-CM | POA: Diagnosis not present

## 2023-08-19 DIAGNOSIS — F32A Depression, unspecified: Secondary | ICD-10-CM | POA: Diagnosis present

## 2023-08-19 DIAGNOSIS — R404 Transient alteration of awareness: Secondary | ICD-10-CM | POA: Diagnosis not present

## 2023-08-19 DIAGNOSIS — Z950 Presence of cardiac pacemaker: Secondary | ICD-10-CM | POA: Diagnosis not present

## 2023-08-19 DIAGNOSIS — I951 Orthostatic hypotension: Principal | ICD-10-CM | POA: Insufficient documentation

## 2023-08-19 DIAGNOSIS — Z85828 Personal history of other malignant neoplasm of skin: Secondary | ICD-10-CM | POA: Diagnosis not present

## 2023-08-19 DIAGNOSIS — N1831 Chronic kidney disease, stage 3a: Secondary | ICD-10-CM | POA: Diagnosis not present

## 2023-08-19 DIAGNOSIS — R001 Bradycardia, unspecified: Secondary | ICD-10-CM | POA: Diagnosis present

## 2023-08-19 DIAGNOSIS — F03911 Unspecified dementia, unspecified severity, with agitation: Secondary | ICD-10-CM | POA: Diagnosis present

## 2023-08-19 LAB — CBC
HCT: 41.7 % (ref 39.0–52.0)
Hemoglobin: 14.8 g/dL (ref 13.0–17.0)
MCH: 32.7 pg (ref 26.0–34.0)
MCHC: 35.5 g/dL (ref 30.0–36.0)
MCV: 92.3 fL (ref 80.0–100.0)
Platelets: 110 10*3/uL — ABNORMAL LOW (ref 150–400)
RBC: 4.52 MIL/uL (ref 4.22–5.81)
RDW: 13.3 % (ref 11.5–15.5)
WBC: 5.2 10*3/uL (ref 4.0–10.5)
nRBC: 0 % (ref 0.0–0.2)

## 2023-08-19 LAB — BASIC METABOLIC PANEL
Anion gap: 7 (ref 5–15)
BUN: 19 mg/dL (ref 8–23)
CO2: 30 mmol/L (ref 22–32)
Calcium: 8.6 mg/dL — ABNORMAL LOW (ref 8.9–10.3)
Chloride: 105 mmol/L (ref 98–111)
Creatinine, Ser: 1.38 mg/dL — ABNORMAL HIGH (ref 0.61–1.24)
GFR, Estimated: 49 mL/min — ABNORMAL LOW (ref >60)
Glucose, Bld: 101 mg/dL — ABNORMAL HIGH (ref 70–99)
Potassium: 3.9 mmol/L (ref 3.5–5.1)
Sodium: 142 mmol/L (ref 135–145)

## 2023-08-19 MED ORDER — ONDANSETRON HCL 4 MG PO TABS: 4.0000 mg | ORAL_TABLET | Freq: Four times a day (QID) | ORAL | Status: DC | PRN

## 2023-08-19 MED ORDER — MIDODRINE HCL 5 MG PO TABS
5.0000 mg | ORAL_TABLET | Freq: Three times a day (TID) | ORAL | Status: DC
  Administered 2023-08-20: 5 mg via ORAL
  Filled 2023-08-19: qty 1

## 2023-08-19 MED ORDER — AMIODARONE HCL 200 MG PO TABS: 200.0000 mg | ORAL_TABLET | Freq: Every day | ORAL | Status: DC

## 2023-08-19 MED ORDER — ACETAMINOPHEN 325 MG PO TABS: 650.0000 mg | ORAL_TABLET | Freq: Four times a day (QID) | ORAL | Status: DC | PRN

## 2023-08-19 MED ORDER — ATORVASTATIN CALCIUM 20 MG PO TABS
20.0000 mg | ORAL_TABLET | Freq: Every day | ORAL | Status: DC
  Administered 2023-08-20: 20 mg via ORAL
  Filled 2023-08-19: qty 1

## 2023-08-19 MED ORDER — SODIUM CHLORIDE 0.9 % IV SOLN: INTRAVENOUS | Status: DC

## 2023-08-19 MED ORDER — LACTATED RINGERS IV BOLUS
1000.0000 mL | Freq: Once | INTRAVENOUS | Status: AC
  Administered 2023-08-19: 1000 mL via INTRAVENOUS

## 2023-08-19 MED ORDER — SENNA 8.6 MG PO TABS
1.0000 | ORAL_TABLET | Freq: Every day | ORAL | Status: DC
  Administered 2023-08-19: 8.6 mg via ORAL
  Filled 2023-08-19: qty 1

## 2023-08-19 MED ORDER — ALIGN 4 MG PO CAPS: ORAL_CAPSULE | Freq: Every day | ORAL | Status: DC

## 2023-08-19 MED ORDER — FLUDROCORTISONE ACETATE 0.1 MG PO TABS
100.0000 ug | ORAL_TABLET | Freq: Every day | ORAL | Status: DC
  Administered 2023-08-20: 100 ug via ORAL
  Filled 2023-08-19: qty 1

## 2023-08-19 MED ORDER — ACETAMINOPHEN 650 MG RE SUPP: 650.0000 mg | Freq: Four times a day (QID) | RECTAL | Status: DC | PRN

## 2023-08-19 MED ORDER — ENOXAPARIN SODIUM 40 MG/0.4ML IJ SOSY
40.0000 mg | PREFILLED_SYRINGE | INTRAMUSCULAR | Status: DC
  Filled 2023-08-19: qty 0.4

## 2023-08-19 MED ORDER — FINASTERIDE 5 MG PO TABS
5.0000 mg | ORAL_TABLET | Freq: Every day | ORAL | Status: DC
  Administered 2023-08-20: 5 mg via ORAL
  Filled 2023-08-19: qty 1

## 2023-08-19 MED ORDER — CYANOCOBALAMIN 500 MCG PO TABS
500.0000 ug | ORAL_TABLET | Freq: Every day | ORAL | Status: DC
  Administered 2023-08-20: 500 ug via ORAL
  Filled 2023-08-19: qty 1

## 2023-08-19 MED ORDER — MIDODRINE HCL 5 MG PO TABS
5.0000 mg | ORAL_TABLET | Freq: Once | ORAL | Status: AC
  Administered 2023-08-19: 5 mg via ORAL
  Filled 2023-08-19: qty 1

## 2023-08-19 MED ORDER — POLYETHYLENE GLYCOL 3350 17 G PO PACK
17.0000 g | PACK | Freq: Every day | ORAL | Status: DC
  Administered 2023-08-20: 17 g via ORAL
  Filled 2023-08-19: qty 1

## 2023-08-19 MED ORDER — VITAMIN D 25 MCG (1000 UNIT) PO TABS
1000.0000 [IU] | ORAL_TABLET | Freq: Every day | ORAL | Status: DC
  Administered 2023-08-20: 1000 [IU] via ORAL
  Filled 2023-08-19: qty 1

## 2023-08-19 MED ORDER — ONDANSETRON HCL 4 MG/2ML IJ SOLN: 4.0000 mg | Freq: Four times a day (QID) | INTRAMUSCULAR | Status: DC | PRN

## 2023-08-19 MED ORDER — DULOXETINE HCL 20 MG PO CPEP
40.0000 mg | ORAL_CAPSULE | Freq: Every day | ORAL | Status: DC
  Administered 2023-08-20: 40 mg via ORAL
  Filled 2023-08-19: qty 2

## 2023-08-19 MED ORDER — ASPIRIN 81 MG PO TBEC
81.0000 mg | DELAYED_RELEASE_TABLET | Freq: Every day | ORAL | Status: DC
  Administered 2023-08-20: 81 mg via ORAL
  Filled 2023-08-19: qty 1

## 2023-08-19 NOTE — ED Triage Notes (Signed)
Arrives from home via [facility]   Hx dementia.  Per report, patient had a syncopal episode and since episode patient has vomited several times.  20g LAC, 4 mg zofran given by EMS

## 2023-08-19 NOTE — Assessment & Plan Note (Signed)
Patient is on amiodarone for frequent PVCs Hold amiodarone since heart rate is in the low 50s Defer resumption to cardiology

## 2023-08-19 NOTE — Assessment & Plan Note (Signed)
Continue Cymbalta.

## 2023-08-19 NOTE — Assessment & Plan Note (Addendum)
Patient had a witnessed syncopal episode most likely related to orthostatic blood pressure changes He has a known history of orthostatic hypotension with recurrent syncopal episodes and follows up with cardiology Continue Florinef and midodrine Hold amiodarone due to bradycardia that could be contributing to his symptoms as well Consider cardiology consult in a.m.

## 2023-08-19 NOTE — ED Provider Notes (Signed)
[facility] Provider Note    Event Date/Time   First MD Initiated Contact with Patient 08/19/23 1633     (approximate)   History   Chief Complaint Loss of Consciousness   HPI  Dylan [name] is a 89 y.o. male with past medical history of orthostatic hypotension on midodrine, PAD, CKD, and dementia who presents to the ED complaining of syncope.  Wife at bedside reports that patient was sitting in a chair this morning after breakfast when he suddenly slumped over and seem to lose consciousness.  She was unable to wake him for a couple of minutes, patient eventually became more alert once EMS arrived.  Patient does not recall the episode, denies having any pain in his chest or difficulty breathing.  Wife states episode today was similar to his previous syncopal episodes, but lasted longer than usual.  She does state that his blood pressure was running low this morning so she gave him an extra half dose of his midodrine.      Physical Exam   Triage Vital Signs: ED Triage Vitals  Encounter Vitals Group     BP 08/19/23 1151 136/65     Systolic BP Percentile --      Diastolic BP Percentile --      Pulse Rate 08/19/23 1151 (!) 50     Resp 08/19/23 1151 16     Temp 08/19/23 1151 (!) 97 F (36.1 C)     Temp Source 08/19/23 1625 Axillary     SpO2 08/19/23 1151 95 %     Weight 08/19/23 1156 176 lb (79.8 kg)     Height 08/19/23 1156 6' (1.829 m)     Head Circumference --      Peak Flow --      Pain Score 08/19/23 1156 0     Pain Loc --      Pain Education --      Exclude from Growth Chart --     Most recent vital signs: Vitals:   08/19/23 1800 08/19/23 1830  BP: 133/70 (!) 152/70  Pulse: (!) 55 (!) 54  Resp: 16 17  Temp:    SpO2: 93% 99%    Constitutional: Awake and alert. Eyes: Conjunctivae are normal. Head: Atraumatic. Nose: No congestion/rhinnorhea. Mouth/Throat: Mucous membranes are moist.  Cardiovascular: Bradycardic, regular rhythm.  Grossly normal heart sounds.  2+ radial pulses bilaterally. Respiratory: Normal respiratory effort.  No retractions. Lungs CTAB. Gastrointestinal: Soft and nontender. No distention. Musculoskeletal: No lower extremity tenderness nor edema.  Neurologic:  Normal speech and language. No gross focal neurologic deficits are appreciated.    ED Results / Procedures / Treatments   Labs (all labs ordered are listed, but only abnormal results are displayed) Labs Reviewed  BASIC METABOLIC PANEL - Abnormal; Notable for the following components:      Result Value   Glucose, Bld 101 (*)    Creatinine, Ser 1.38 (*)    Calcium 8.6 (*)    GFR, Estimated 49 (*)    All other components within normal limits  CBC - Abnormal; Notable for the following components:   Platelets 110 (*)    All other components within normal limits  URINALYSIS, ROUTINE W REFLEX MICROSCOPIC  CBG MONITORING, ED     EKG  ED ECG REPORT I, Chesley Noon, the attending physician, personally viewed and interpreted this ECG.   Date: 08/19/2023  EKG Time: 11:53  Rate: 49  Rhythm: sinus bradycardia  Axis: LAD  Intervals:right  bundle branch block and left anterior fascicular block  ST&T Change: None  PROCEDURES:  Critical Care performed: No  Procedures   MEDICATIONS ORDERED IN ED: Medications  midodrine (PROAMATINE) tablet 5 mg (has no administration in time range)  lactated ringers bolus 1,000 mL (has no administration in time range)  lactated ringers bolus 1,000 mL (1,000 mLs Intravenous New Bag/Given 08/19/23 1826)     IMPRESSION / MDM / ASSESSMENT AND PLAN / ED COURSE  I reviewed the triage vital signs and the nursing notes.                              89 y.o. male with past medical history of orthostatic hypotension on midodrine, PAD, CKD, and dementia who presents to the ED for syncopal episode at home.  Patient's presentation is most consistent with acute presentation with potential threat to life or  bodily function.  Differential diagnosis includes, but is not limited to, arrhythmia, orthostatic hypotension, vasovagal episode, dehydration, anemia, AKI.  Patient chronically ill-appearing but nontoxic and in no acute distress, vital signs remarkable for bradycardia but otherwise reassuring.  EKG shows sinus bradycardia with bifascicular block, similar to previous.  He has been previously evaluated by cardiology for syncope and bradycardia, deemed to not be a candidate for pacemaker given advanced age and comorbidities.  Suspect orthostatic hypotension contributed to his syncopal episode today, we will hydrate with IV fluids, give his evening dose of midodrine, and recheck orthostatic vital signs.  Labs show stable CKD without acute electrolyte abnormality, no significant anemia or leukocytosis noted.  Patient continues to feel very lightheaded and unstable upon standing despite IV fluids, remains orthostatic on repeat vital signs.  We will give additional IV fluids and his evening dose of midodrine.  Case discussed with hospitalist for admission.      FINAL CLINICAL IMPRESSION(S) / ED DIAGNOSES   Final diagnoses:  Syncope and collapse  Bradycardia     Rx / DC Orders   ED Discharge Orders     None        Note:  This document was prepared using Dragon voice recognition software and may include unintentional dictation errors.   Chesley Noon, MD 08/19/23 2039

## 2023-08-19 NOTE — Assessment & Plan Note (Signed)
Chronic Stable 

## 2023-08-19 NOTE — H&P (Signed)
History and Physical    Patient: Dylan [name] YTK:[id number] DOB: 1934-08-25 DOA: 08/19/2023 DOS: the patient was seen and examined on 08/19/2023 PCP: Eustaquio Boyden, MD  Patient coming from: Home  Chief Complaint:  Chief Complaint  Patient presents with   Loss of Consciousness   Most of the history was obtained from his wife at the bedside.  Patient is unable to provide any history HPI: Dylan [name] is a 89 y.o. 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  His wife states that he has a history of recurrent syncopal episodes but that the episode today was different from his prior episodes. She states that he was sitting up in a chair and had just had breakfast.  She checked his blood pressure and it was 120/50 and so she gave him 1/2 tablet of midodrine 5 mg.  She had gone to recheck his blood pressure following administration of midodrine when he suddenly slumped in the chair and then his head fell back and he stopped responding.  She states that usually he regains consciousness quickly but that this took longer than normal and so she called 911.  While taking instructions from the 911 operator, she noted that he started regaining consciousness and then he had an episode of emesis and so she laid him down on the ground and EMS arrived.  She notes that he had several more episodes of emesis prior to arriving to the emergency room. I am unable to do review of systems on this patient due to his underlying dementia. He was noted to be bradycardic upon arrival with heart rate in the 50s.  He received 2 L of IV fluid.  His labs are within normal limits. They attempted to ambulate him in the ER but he was still very  unsteady on his feet.  According to his wife he ambulates without an assist device. He will be referred to observation status for further evaluation.    Review of Systems: unable to review all systems due to the inability of the patient to answer questions. Past Medical History:  Diagnosis Date   Basal cell carcinoma 08/31/2020   Mid forehead - ED&C    Basal cell carcinoma of right nasal sidewall 12/15/2014   Bilateral hearing loss 08/03/2018   S/p audiology evaluation, has new hearing aides.   Bradycardia    a. 08/2018 ETT: Poor ex tolerance (2:10); b. 09/2018 s/p MDT Linq.   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 09/2018 s/p MDT Linq; b. 10/2021 Echo: EF 55-60%, no rwma, nl RV fxn, mild MR, triv AI.   Thrombocytopenia (HCC) 02/21/2011   periph smear stable 02/2020    Vitamin B12 deficiency 08/03/2018   Completed b12 shots 09/2018, maintaining levels on oral replacement (dissolvable tablets) 01/2019   Vitamin D deficiency 05/19/2018   Past Surgical History:  Procedure Laterality Date   BASAL CELL CARCINOMA EXCISION Right 01/2015   with reconstructive surgery around eye   CATARACT EXTRACTION Left 03/2017   ETT  09/2018   negative for ischemia, extremely poor exercise tolerance ([place])   FOOT SURGERY Left 1998   metatarsal amputation after injury   KNEE ARTHROSCOPY  1993   Right   KNEE ARTHROSCOPY  01/2001   Left   LOOP RECORDER INSERTION N/A 09/30/2018   Procedure: LOOP RECORDER INSERTION;  Surgeon: Regan Lemming, MD;  Location: [facility] IN[facility];  Service: Cardiovascular;  Laterality: N/A;   Social History:  reports that he quit smoking about 62 years ago. His smoking use included cigarettes. He has never used smokeless tobacco. He reports that he does not drink alcohol and does not use drugs.  Allergies  Allergen Reactions   Doxepin Other (See Comments)    Lethargy, overly sedating at low dose   Seroquel [Quetiapine] Other (See Comments)    Worsened agitation, increased BP   Lincomycin Hcl Rash    Family History  Problem Relation Age of Onset   Congestive Heart Failure Mother    Stroke Father 42   Congestive Heart Failure Father    Cataracts Sister    Leukemia Brother     Prior to Admission medications   Medication Sig Start Date End Date Taking? Authorizing Provider  amiodarone (PACERONE) 200 MG tablet TAKE 1 TABLET BY MOUTH TWICE DAILY FOR ONE MONTH. THEN TAKE ONE TABLET BY MOUTH ONCE DAILY THEREAFTER 06/02/23   [place], Andree Coss, MD  aspirin EC 81 MG tablet Take 1 tablet (81 mg total) by mouth daily. Swallow whole. 03/18/23   Eustaquio Boyden, MD  atorvastatin (LIPITOR) 20 MG tablet Take 1 tablet (20 mg total) by mouth daily. 06/06/23   Eustaquio Boyden, MD  Cholecalciferol (VITAMIN D3) 1000 units CAPS Take 1 capsule  (1,000 Units total) by mouth daily. 08/03/18   Eustaquio Boyden, MD  DULoxetine (CYMBALTA) 20 MG capsule Take 2 capsules by mouth once daily 07/16/23   Eustaquio Boyden, MD  fexofenadine [facility] ALLERGY) 180 MG tablet Take 1 tablet (180 mg total) by mouth daily. 02/19/22   Eustaquio Boyden, MD  finasteride (PROSCAR) 5 MG tablet Take 1 tablet by mouth once daily 12/09/22   Eustaquio Boyden, MD  fludrocortisone (FLORINEF) 0.1 MG tablet Take 1 tablet by mouth once daily 07/08/23   Camn[place], Andree Coss, MD  guaiFENesin (MUCINEX) 600 MG 12 hr tablet Take 1,200 mg by mouth daily.    [provider]  midodrine (PROAMATINE) 5 MG tablet TAKE 1 TABLET BY MOUTH THREE TIMES DAILY WITH MEALS 07/16/23   Eustaquio Boyden, MD  Polyethylene Glycol 3350 (MIRALAX PO) Take 17 g by mouth daily.    [provider]  Probiotic Product (ALIGN PO) Take by mouth daily.    [provider]  Sennosides (SENOKOT PO) Take 1 tablet by mouth at bedtime.    [provider]  vitamin B-12 (V-R VITAMIN B-12) 500 MCG tablet Take 1 tablet (500 mcg total) by mouth daily. Patient taking differently: Take 500 mcg by mouth at bedtime. 08/11/19   Eustaquio Boyden, MD    Physical Exam: Vitals:   08/19/23 1700 08/19/23 1730 08/19/23 1800 08/19/23 1830  BP: 139/68 127/74 133/70 (!) 152/70  Pulse: (!) 56 (!) 55 (!) 55 (!) 54  Resp: 16 16 16 17   Temp:      TempSrc:      SpO2: 95% 95% 93% 99%  Weight:      Height:       Physical Exam Vitals and nursing note reviewed.  Constitutional:      Comments: Chronically ill-appearing.  Appears comfortable and in no distress.  HENT:     Head: Normocephalic and atraumatic.     Nose: Nose normal.     Mouth/Throat:     Mouth: Mucous membranes are moist.  Eyes:     Conjunctiva/sclera: Conjunctivae normal.  Cardiovascular:     Rate and Rhythm: Bradycardia present.  Pulmonary:     Effort: Pulmonary effort is normal.     Breath sounds: Normal breath sounds.   Abdominal:     General: Abdomen is flat. Bowel sounds are normal.     Palpations: Abdomen is soft.  Musculoskeletal:        General: Normal range of motion.     Cervical back: Normal range of motion and neck supple.  Skin:    General: Skin is warm and dry.  Neurological:     Mental Status: He is alert.     Motor: Weakness present.     Comments: Oriented to person and place  Psychiatric:        Mood and Affect: Mood normal.        Behavior: Behavior normal.     Data Reviewed: Relevant notes from primary care and specialist visits, past discharge summaries as available in EHR, including Care Everywhere. Prior diagnostic testing as pertinent to current admission diagnoses Updated medications and problem lists for reconciliation ED course, including vitals, labs, imaging, treatment and response to treatment Triage notes, nursing and pharmacy notes and ED provider's notes Notable results as noted in HPI Labs reviewed.  Sodium 142, potassium 3.9, chloride 105, bicarb 30, glucose 101, BUN 19, creatinine 1.38, calcium 8.6, white count 5.2, hemoglobin 14.8, hematocrit 41.7, platelet count 110 Twelve-lead EKG reviewed by me shows sinus bradycardia, right bundle branch block, left anterior fascicular block, bifascicular block There are no new results to review at this time.  Assessment and Plan: * Syncope and collapse Patient had a witnessed syncopal episode most likely related to orthostatic blood pressure changes He has a known history of orthostatic hypotension with recurrent syncopal episodes and follows up with cardiology Continue Florinef and midodrine Hold amiodarone due to bradycardia that could be contributing to his symptoms as well Consider cardiology consult in a.m.  Bradycardia Patient is on amiodarone for frequent PVCs Hold amiodarone since heart rate is in the low 50s Defer resumption to cardiology  Orthostatic hypotension Continue Florinef and  midodrine  Thrombocytopenia (HCC) Chronic Stable  Depression Continue Cymbalta  Chronic kidney disease, stage 3a (HCC) Patient has a history of stage IIIa chronic kidney disease which is stable  Frequent PVCs Hold amiodarone  Advance Care Planning:   Code Status: Do not attempt resuscitation (DNR) - Comfort care   Consults: Cardiology in a.m.  Family Communication: Plan of care was discussed with patient's wife at the bedside.   All questions and concerns have been addressed. She verbalizes understanding and agrees with the plan.  CODE STATUS was discussed and he is a DO NOT RESUSCITATE  Severity of Illness: The appropriate patient status for this patient is OBSERVATION. Observation status is judged to be reasonable and necessary in order to provide the required intensity of service to ensure the patient's safety. The patient's presenting symptoms, physical exam findings, and initial radiographic and laboratory data in the context of their medical condition is felt to place them at decreased risk for further clinical deterioration. Furthermore, it is anticipated that the patient will be medically stable for discharge from the hospital within 2 midnights of admission.   Author: Lucile Shutters, MD 08/19/2023 9:33 PM  For on call review [email or web address].

## 2023-08-19 NOTE — Assessment & Plan Note (Signed)
Patient has a history of stage IIIa chronic kidney disease which is stable

## 2023-08-19 NOTE — ED Triage Notes (Signed)
Pt arrives via EMS from home. Per wife pt was at home when pt passed out after morning medication. Per wife she gave pt an extra dose of his midodrin due to his BP being lower than normal this AM. Pt ate breakfast well and than he passed out according to the wife.

## 2023-08-19 NOTE — Assessment & Plan Note (Signed)
Continue Florinef and midodrine

## 2023-08-19 NOTE — Assessment & Plan Note (Signed)
Hold amiodarone

## 2023-08-20 ENCOUNTER — Observation Stay (HOSPITAL_BASED_OUTPATIENT_CLINIC_OR_DEPARTMENT_OTHER)
Admit: 2023-08-20 | Discharge: 2023-08-20 | Disposition: A | Discharge status: Home / Self Care | Payer: Medicare Other | Attending: Internal Medicine | Admitting: Internal Medicine

## 2023-08-20 DIAGNOSIS — R55 Syncope and collapse: Secondary | ICD-10-CM

## 2023-08-20 LAB — BASIC METABOLIC PANEL
Anion gap: 6 (ref 5–15)
BUN: 13 mg/dL (ref 8–23)
CO2: 31 mmol/L (ref 22–32)
Calcium: 8.2 mg/dL — ABNORMAL LOW (ref 8.9–10.3)
Chloride: 102 mmol/L (ref 98–111)
Creatinine, Ser: 1.27 mg/dL — ABNORMAL HIGH (ref 0.61–1.24)
GFR, Estimated: 55 mL/min — ABNORMAL LOW (ref >60)
Glucose, Bld: 88 mg/dL (ref 70–99)
Potassium: 3.6 mmol/L (ref 3.5–5.1)
Sodium: 139 mmol/L (ref 135–145)

## 2023-08-20 LAB — ECHOCARDIOGRAM COMPLETE
AR max vel: 4.12 cm2
AV Area VTI: 3.55 cm2
AV Area mean vel: 3.07 cm2
AV Mean grad: 3 mm[Hg]
AV Peak grad: 5.6 mm[Hg]
Ao pk vel: 1.18 m/s
Area-P 1/2: 3.03 cm2
MV VTI: 2.84 cm2
P 1/2 time: 971 ms
S' Lateral: 2.9 cm

## 2023-08-20 LAB — CBC
HCT: 39 % (ref 39.0–52.0)
Hemoglobin: 13.7 g/dL (ref 13.0–17.0)
MCH: 33.1 pg (ref 26.0–34.0)
MCHC: 35.1 g/dL (ref 30.0–36.0)
MCV: 94.2 fL (ref 80.0–100.0)
Platelets: 103 10*3/uL — ABNORMAL LOW (ref 150–400)
RBC: 4.14 MIL/uL — ABNORMAL LOW (ref 4.22–5.81)
RDW: 13.2 % (ref 11.5–15.5)
WBC: 8.2 10*3/uL (ref 4.0–10.5)
nRBC: 0 % (ref 0.0–0.2)

## 2023-08-20 MED ORDER — AMIODARONE HCL 200 MG PO TABS: 100.0000 mg | ORAL_TABLET | Freq: Every day | ORAL | Status: DC

## 2023-08-20 MED ORDER — ACETAMINOPHEN 325 MG PO TABS: 650.0000 mg | ORAL_TABLET | Freq: Four times a day (QID) | ORAL | 0 refills | Status: DC | PRN

## 2023-08-20 MED ORDER — RISAQUAD PO CAPS
1.0000 | ORAL_CAPSULE | Freq: Every day | ORAL | Status: DC
  Administered 2023-08-20: 1 via ORAL
  Filled 2023-08-20: qty 1

## 2023-08-20 MED ORDER — AMIODARONE HCL 100 MG PO TABS: 100.0000 mg | ORAL_TABLET | Freq: Every day | ORAL | 1 refills | Status: DC

## 2023-08-20 MED ORDER — AMIODARONE HCL 200 MG PO TABS
100.0000 mg | ORAL_TABLET | Freq: Every day | ORAL | Status: DC
  Administered 2023-08-20: 100 mg via ORAL
  Filled 2023-08-20: qty 1

## 2023-08-20 NOTE — ED Notes (Signed)
Patient ambulatory with one nurse assistance to restroom.  Patient had to be coached to stand up straight while walking due to patient leaning to side.

## 2023-08-20 NOTE — Consult Note (Signed)
Cardiology Consultation   Patient ID: Dylan [name] MRN: [id number]; DOB: 30-Jul-1934  Admit date: 08/19/2023 Date of Consult: 08/20/2023  PCP:  Eustaquio Boyden, MD   [facility] Providers Cardiologist:  Nanetta Batty, MD  Electrophysiologist:  Regan Lemming, MD  {   Patient Profile:   Dylan [name] is a 89 y.o. male with a hx of syncope, dementia, orthostatic hypotension, PAD, CKD stage 3, dyslipidemia, PVCs on amiodarone who is being seen 08/20/2023 for the evaluation of syncope at the request of Dr. Meriam Sprague.  History of Present Illness:   Dylan [name] is followed by Dr. Allyson Sabal and Dr. Elberta Fortis.   Patient has a history of syncope and had prior loop recorder insertion followed by Dr. Elberta Fortis. ILR showed no cause for syncope. Last reading was in 05/2022. Heart monitor in 10/2022 showed NSR, average HR 64bpm, BBB/IVCD, 1 run of VT, 173 runs of SVT, longest lasting 2 minutes 12 seconds, 29% PVC burden). He was was started on amiodarone. He has low baseline HR with RBBB.  Echo 05/2022 showed LVEF 55-60%, no WMA, G2DD, mild MR, mild AI.  PAD followed by Dr. Allyson Sabal. Dopplers in 2021 showed right ABI 0.79 and left 1.22, right popliteal disease. Since he is minimally ambulatory, he was not taken for further diagnostic testing. He was note felt to be pacemaker candidate due to age, dementia and DNR status.  The patient presented for syncope. He has severe dementia, cared for by his wife. Apparently the patient has h/o syncope. This time, he was sitting on bed and wife was taking BP. BP was low (116/50s) and she gave him a full midodrine pill. His head went down and he lost consciousness. He fell back ward and the wife was trying to wake him up and he vomited. He vomited twice. She called EMS who brought him to the ER. This syncopal episode was different in that he was vomiting.  No recent changes that could have caused this. He never complained of abdominal pain. He can walk around the  house, does not use a cane or walker. He had been eating well, but doesn't drink that much.   The last time syncopal event was a couple months ago while in the shower.   In the ER BP 136/65, 50bpm, RR 16, afebrile, 95% O2. Labs showed BG 101, Scr 1.38, BUN 19, K3.9. Hgb 14.8, WBC 5.2, plt 110. EKG showed SB, 49bpm, RBBB, LAFB, LAD, nonspecific changes. CT head non-acute. He was given midodrine and IVF.   On interview, the patient says he is feeling OK. He says he wants to go home.   Past Medical History:  Diagnosis Date   Basal cell carcinoma 08/31/2020   Mid forehead - ED&C    Basal cell carcinoma of right nasal sidewall 12/15/2014   Bilateral hearing loss 08/03/2018   S/p audiology evaluation, has new hearing aides.   Bradycardia    a. 08/2018 ETT: Poor ex tolerance (2:10); b. 09/2018 s/p MDT Linq.   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   HYDROCELE, LEFT 02/03/2007   Hyperlipidemia 12/1999   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/2014   Mohs defect of eyelid 12/14/2014   Nuclear sclerosis, left 04/01/2017   Obstruction of right lacrimal duct 10/16/2015   PAD (peripheral artery disease) (HCC) 03/21/2020   Abnormal ABI, abnormal arterial duplex 03/2020:  R - moderate RLE disease, abnormal TBI L - WNL, abnormal TBI Referred for vascular consult - rec medical management   Pruritus 02/19/2020   Recurrent syncope 06/22/2018   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    Right leg swelling 02/19/2020   Sepsis (HCC) 05/30/2020   Syncope    a. 09/2018 s/p MDT Linq; b. 10/2021 Echo: EF 55-60%, no rwma, nl RV fxn, mild MR, triv AI.   Thrombocytopenia (HCC) 02/21/2011   periph smear stable 02/2020   Vitamin B12 deficiency 08/03/2018   Completed b12 shots 09/2018, maintaining levels on oral replacement (dissolvable tablets) 01/2019   Vitamin D deficiency 05/19/2018    Past Surgical History:  Procedure Laterality Date   BASAL CELL CARCINOMA EXCISION Right 01/2015   with reconstructive surgery around eye   CATARACT EXTRACTION Left 03/2017   ETT  09/2018   negative for ischemia, extremely poor exercise tolerance ([place])   FOOT SURGERY Left 1998   metatarsal amputation after injury   KNEE ARTHROSCOPY  1993   Right   KNEE ARTHROSCOPY  01/2001   Left   LOOP RECORDER INSERTION N/A 09/30/2018   Procedure: LOOP RECORDER INSERTION;  Surgeon: Regan Lemming, MD;  Location: [facility];  Service: Cardiovascular;  Laterality: N/A;     Home Medications:  Prior to Admission medications   Medication Sig Start Date End Date Taking? Authorizing Provider  amiodarone (PACERONE) 200 MG tablet TAKE 1 TABLET BY MOUTH TWICE DAILY FOR ONE MONTH. THEN TAKE ONE TABLET BY MOUTH ONCE DAILY THEREAFTER 06/02/23  Yes Camn[place], Andree Coss, MD  aspirin EC 81 MG tablet Take 1 tablet (81 mg total) by mouth daily. Swallow whole. 03/18/23  Yes Eustaquio Boyden, MD  atorvastatin (LIPITOR) 20 MG tablet Take 1 tablet (20 mg total) by mouth daily. 06/06/23  Yes Eustaquio Boyden, MD  Cholecalciferol (VITAMIN D3) 1000 units CAPS Take 1 capsule (1,000 Units total) by mouth daily. 08/03/18  Yes Eustaquio Boyden, MD  DULoxetine (CYMBALTA) 20 MG capsule Take 2 capsules by mouth once daily 07/16/23  Yes Eustaquio Boyden, MD  fexofenadine [facility] ALLERGY) 180 MG tablet Take 1 tablet (180 mg total) by mouth daily. 02/19/22  Yes Eustaquio Boyden, MD  finasteride (PROSCAR) 5 MG tablet Take 1  tablet by mouth once daily 12/09/22  Yes Eustaquio Boyden, MD  fludrocortisone (FLORINEF) 0.1 MG tablet Take 1 tablet by mouth once daily 07/08/23  Yes Camnitz, Will Daphine Deutscher, MD  guaiFENesin (MUCINEX) 600 MG 12 hr tablet Take 1,200 mg by mouth daily.   Yes [provider]  midodrine (PROAMATINE) 5 MG tablet TAKE 1 TABLET BY MOUTH THREE TIMES DAILY WITH MEALS 07/16/23  Yes Eustaquio Boyden, MD  Polyethylene Glycol 3350 (MIRALAX PO) Take 17 g by mouth daily.   Yes [provider]  Probiotic Product (ALIGN PO) Take by mouth daily.   Yes [provider]  Sennosides (SENOKOT PO) Take 1 tablet by mouth at bedtime.   Yes [provider]  vitamin B-12 (V-R VITAMIN B-12) 500 MCG tablet Take 1 tablet (500 mcg total) by mouth daily. Patient taking differently: Take  500 mcg by mouth at bedtime. 08/11/19  Yes Eustaquio Boyden, MD  acetaminophen (TYLENOL) 325 MG tablet Take 2 tablets (650 mg total) by mouth every 6 (six) hours as needed for mild pain (or Fever >/= 101). 08/20/23   Loyce Dys, MD    Inpatient Medications: Scheduled Meds:  acidophilus  1 capsule Oral Daily   aspirin EC  81 mg Oral Daily   atorvastatin  20 mg Oral Daily   cholecalciferol  1,000 Units Oral Daily   cyanocobalamin  500 mcg Oral Daily   DULoxetine  40 mg Oral Daily   enoxaparin (LOVENOX) injection  40 mg Subcutaneous Q24H   finasteride  5 mg Oral Daily   fludrocortisone  100 mcg Oral Daily   midodrine  5 mg Oral TID WC   polyethylene glycol  17 g Oral Daily   senna  1 tablet Oral QHS   Continuous Infusions:  PRN Meds: acetaminophen **OR** acetaminophen, ondansetron **OR** ondansetron (ZOFRAN) IV  Allergies:    Allergies  Allergen Reactions   Doxepin Other (See Comments)    Lethargy, overly sedating at low dose   Seroquel [Quetiapine] Other (See Comments)    Worsened agitation, increased BP   Lincomycin Hcl Rash    Social History:   Social History   Socioeconomic History    Marital status: Married    Spouse name: Not on file   Number of children: 3   Years of education: Not on file   Highest education level: Not on file  Occupational History   Occupation: Retired    Comment: 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, PDR   Edu: HS   Social Determinants of Health   Financial Resource Strain: Low Risk  (10/28/2022)   Overall Financial Resource Strain (CARDIA)    Difficulty of Paying Living Expenses: Not hard at all  Food Insecurity: No Food Insecurity (10/28/2022)   Hunger Vital Sign    Worried About Running Out of Food in the Last Year: Never true    Ran Out of Food in the Last Year: Never true  Transportation Needs: No Transportation Needs (10/28/2022)   PRAPARE - Administrator, Civil Service (Medical): No    Lack of Transportation (Non-Medical): No  Physical Activity: Not on file  Stress: Not on file  Social Connections: Not on file  Intimate Partner Violence: Not on file    Family History:    Family History  Problem Relation Age of Onset   Congestive Heart Failure Mother    Stroke Father 51   Congestive Heart Failure Father    Cataracts Sister    Leukemia Brother      ROS:  Please see the history of present illness.   All other ROS reviewed and negative.     Physical Exam/Data:   Vitals:   08/20/23 0150 08/20/23 0200 08/20/23 0326 08/20/23 0400  BP:  Marland Kitchen)  135/51  (!) 146/66  Pulse: (!) 56 (!) 56  64  Resp: 18 15  14   Temp:    97.9 F (36.6 C)   TempSrc:   Oral   SpO2: 94% 95%  96%  Weight:      Height:        Intake/Output Summary (Last 24 hours) at 08/20/2023 0803 Last data filed at 08/20/2023 0102 Gross per 24 hour  Intake 2000 ml  Output --  Net 2000 ml      08/19/2023   11:56 AM 07/29/2023    2:54 PM 06/06/2023    3:09 PM  Last 3 Weights  Weight (lbs) 176 lb 172 lb 179 lb  Weight (kg) 79.833 kg 78.019 kg 81.194 kg     Body mass index is 23.87 kg/m.  General:  Well nourished, well developed, in no acute distress HEENT: normal Neck: no JVD Vascular: No carotid bruits; Distal pulses 2+ bilaterally Cardiac:  normal S1, S2; RRR; no murmur  Lungs:  clear to auscultation bilaterally, no wheezing, rhonchi or rales  Abd: soft, nontender, no hepatomegaly  Ext: no edema Musculoskeletal:  No deformities, BUE and BLE strength normal and equal Skin: warm and dry  Neuro:  CNs 2-12 intact, no focal abnormalities noted Psych:  Normal affect   EKG:  The EKG was personally reviewed and demonstrates:  SB, 49bpm, RBBB, LAFB, LAD, nonspecific changes Telemetry:  Telemetry was personally reviewed and demonstrates:  bradycardia 50s  Relevant CV Studies:  Heart monitor 10/2022  Patch Wear Time:  14 days and 0 hours (2023-11-21T11:22:38-0500 to 2023-12-05T11:22:38-0500)   Patient had a min HR of 43 bpm, max HR of 152 bpm, and avg HR of 64 bpm. Predominant underlying rhythm was Sinus Rhythm. Bundle Branch Block/IVCD was present. 1 run of Ventricular Tachycardia occurred lasting 4 beats with a max rate of 140 bpm (avg 114  bpm). 174 Supraventricular Tachycardia runs occurred, the run with the fastest interval lasting 6 beats with a max rate of 152 bpm, the longest lasting 2 mins 12 secs with an avg rate of 124 bpm. Isolated SVEs were occasional (1.0%, 13390), SVE Couplets  were occasional (1.1%, 7043), and SVE Triplets were rare (<1.0%, 513). Isolated VEs were frequent (29.7%, [id number]), VE Couplets were rare (<1.0%,  3930), and VE Triplets were rare (<1.0%, 2418). Ventricular Bigeminy and Trigeminy were present.   SR/SB Freq runs of SVT (174) Occasional PACs Freq PVCs (29% burden)  Needs ROV to discuss  Echo 05/2022  1. Left ventricular ejection fraction, by estimation, is 55 to 60%. The  left ventricle has normal function. The left ventricle has no regional  wall motion abnormalities. Left ventricular diastolic parameters are  consistent with Grade II diastolic  dysfunction (pseudonormalization).   2. Right ventricular systolic function is normal. The right ventricular  size is normal.   3. Left atrial size was mildly dilated.   4. The mitral valve is normal in structure. Mild mitral valve  regurgitation. No evidence of mitral stenosis.   5. The aortic valve was not well visualized. Aortic valve regurgitation  is mild. No aortic stenosis is present.   6. The inferior vena cava is normal in size with greater than 50%  respiratory variability, suggesting right atrial pressure of 3 mmHg.     Laboratory Data:  High Sensitivity Troponin:  No results for input(s): "TROPONINIHS" in the last 720 hours.   Chemistry Recent Labs  Lab 08/19/23 1157 08/20/23 0409  NA 142 139  K 3.9  3.6  CL 105 102  CO2 30 31  GLUCOSE 101* 88  BUN 19 13  CREATININE 1.38* 1.27*  CALCIUM 8.6* 8.2*  GFRNONAA 49* 55*  ANIONGAP 7 6    No results for input(s): "PROT", "ALBUMIN", "AST", "ALT", "ALKPHOS", "BILITOT" in the last 168 hours. Lipids No results for input(s): "CHOL", "TRIG", "HDL", "LABVLDL", "LDLCALC", "CHOLHDL" in the last 168 hours.  Hematology Recent Labs  Lab 08/19/23 1157 08/20/23 0324  WBC 5.2 8.2  RBC 4.52 4.14*  HGB 14.8 13.7  HCT 41.7 39.0  MCV 92.3 94.2  MCH 32.7 33.1  MCHC 35.5 35.1  RDW 13.3 13.2  PLT 110* 103*   Thyroid No results for input(s): "TSH", "FREET4" in the last 168 hours.  BNPNo results for input(s): "BNP", "PROBNP" in the last 168 hours.  DDimer No results for input(s):  "DDIMER" in the last 168 hours.   Radiology/Studies:  No results found.   Assessment and Plan:   Syncope - long history of syncope. Also with history of orthostatic hypotension on florinef and midodrine - prior ILR (implanted for syncope) with no etiology of syncope, last report 05/2022 - subsequent HR monitor with SR/SB, SVT and frequent PVCs (29%) on amiodarone 200mg  - he has known baseline bradycardia - decrease amiodarone to help with bradycardia - orthostatic negative  - check echo - Keep Mag>2 and K>4 - check TSH - can repeat heart monitor at d/c  PVCs - PTA amiodarone 200mg  daily - decrease to 100mg  daily  Bradycardia - h/o bradycardia on amiodarone for PVCs - decrease amiodarone as above - previously felt not to be a good PPM candidate given age, DNR status and dementia  Orthostatic hypotension - PTA florinef 0.2mg  daily and midodrine 5mg  TID - orthostatics negative  For questions or updates, please contact [facility] Please consult [email or web address] for contact info under    Signed, Ellyse Rotolo David Stall, PA-C  08/20/2023 8:03 AM

## 2023-08-20 NOTE — Progress Notes (Signed)
*  PRELIMINARY RESULTS* Echocardiogram 2D Echocardiogram has been performed.  Dylan [name] 08/20/2023, 2:15 PM

## 2023-08-20 NOTE — ED Notes (Signed)
Patient continues to attempt to get out of bed.  Patient able to be redirected, bed alarm on.  Patient also continues to remove brief and monitoring equipment despite redirection.

## 2023-08-20 NOTE — ED Notes (Signed)
Perineal care performed. Disposable brief changed, bed linen and under pads was changed as well d/t soiled with urine. Pt tolerated it well.

## 2023-08-20 NOTE — Evaluation (Signed)
Physical Therapy Evaluation Patient Details Name: Dylan [name] MRN: [id number] DOB: 1934/08/04 Today's Date: 08/20/2023  History of Present Illness  89 y/o male presented to ED on 08/19/23 for syncopal episode. PMH: dementia, orthostatic hypotension, PAD, CKD IIIa, Aflutter, hx of Cdiff.  Clinical Impression  Patient admitted with the above. PTA, patient lives with wife who provides assistance with ADLs and supervision for mobility with no AD. Has a hx of falling due to syncopal episodes. Patient presents with weakness, impaired balance, and decreased activity tolerance. Patient has hx of cognitive impairments 2/2 dementia but follows commands with increased time. Ambulated 100' with HHA and CGA for safety with minor LOBs but able to self correct. Wife reports able to manage patient safely at home and has necessary equipment. Patient will benefit from skilled PT services during acute stay to address listed deficits. No PT follow up recommended at this time.      If plan is discharge home, recommend the following: Supervision due to cognitive status;A little help with bathing/dressing/bathroom   Can travel by private vehicle        Equipment Recommendations None recommended by PT  Recommendations for Other Services       Functional Status Assessment Patient has had a recent decline in their functional status and demonstrates the ability to make significant improvements in function in a reasonable and predictable amount of time.     Precautions / Restrictions Precautions Precautions: Fall Restrictions Weight Bearing Restrictions: No      Mobility  Bed Mobility Overal bed mobility: Modified Independent                  Transfers Overall transfer level: Needs assistance Equipment used: None Transfers: Sit to/from Stand Sit to Stand: Supervision                Ambulation/Gait Ambulation/Gait assistance: Contact guard assist Gait Distance (Feet): 100  Feet Assistive device: 1 person hand held assist Gait Pattern/deviations: Step-through pattern, Decreased stride length Gait velocity: decreased     General Gait Details: CGA for safety. Patient utilizing HHA offered but does not require HHA. Minor LOBs but able to self correct  Stairs            Wheelchair Mobility     Tilt Bed    Modified Rankin (Stroke Patients Only)       Balance Overall balance assessment: Mild deficits observed, not formally tested, History of Falls                                           Pertinent Vitals/Pain Pain Assessment Pain Assessment: Faces Faces Pain Scale: No hurt    Home Living Family/patient expects to be discharged to:: Private residence Living Arrangements: Spouse/significant other Available Help at Discharge: Family;Available 24 hours/day Type of Home: House Home Access: Stairs to enter Entrance Stairs-Rails: Can reach both;Left;Right Entrance Stairs-Number of Steps: 3   Home Layout: One level Home Equipment: Cane - single point;Rolling Ruberta Holck (2 wheels);Grab bars - toilet;Grab bars - tub/shower;Shower seat - built in;BSC/3in1 Additional Comments: has a bed with rails on all 4 sides and alarm at home    Prior Function Prior Level of Function : Needs assist;History of Falls (last six months)             Mobility Comments: per wife, patient ambulates with no AD but requires supervision for safety. Hx  of falls due to syncopal episodes ADLs Comments: Wife assists with all ADLs (supervision for bathing) and IADLs (including medication management), sometimes able to do light housekeeping tasks with VC for sequencing/initiation, set up for self feeding, wife drives, incontinent at baseline (wears depends)     Extremity/Trunk Assessment   Upper Extremity Assessment Upper Extremity Assessment: Overall WFL for tasks assessed    Lower Extremity Assessment Lower Extremity Assessment: Overall WFL for tasks  assessed    Cervical / Trunk Assessment Cervical / Trunk Assessment: Kyphotic  Communication   Communication Communication: No apparent difficulties (Minimal verbalizations but follows commands well)  Cognition Arousal: Alert Behavior During Therapy: Flat affect Overall Cognitive Status: History of cognitive impairments - at baseline                                          General Comments      Exercises     Assessment/Plan    PT Assessment Patient needs continued PT services  PT Problem List Decreased strength;Decreased balance;Decreased activity tolerance;Decreased mobility;Decreased cognition;Decreased safety awareness       PT Treatment Interventions DME instruction;Gait training;Functional mobility training;Therapeutic activities;Therapeutic exercise;Balance training;Stair training;Patient/family education    PT Goals (Current goals can be found in the Care Plan section)  Acute Rehab PT Goals Patient Stated Goal: did not state PT Goal Formulation: With family Time For Goal Achievement: 09/03/23 Potential to Achieve Goals: Fair    Frequency Min 1X/week     Co-evaluation               AM-PAC PT "6 Clicks" Mobility  Outcome Measure Help needed turning from your back to your side while in a flat bed without using bedrails?: None Help needed moving from lying on your back to sitting on the side of a flat bed without using bedrails?: None Help needed moving to and from a bed to a chair (including a wheelchair)?: A Little Help needed standing up from a chair using your arms (e.g., wheelchair or bedside chair)?: A Little Help needed to walk in hospital room?: A Little Help needed climbing 3-5 steps with a railing? : A Little 6 Click Score: 20    End of Session   Activity Tolerance: Patient tolerated treatment well Patient left: in bed;with call bell/phone within reach;with bed alarm set;with family/visitor present Nurse Communication:  Mobility status PT Visit Diagnosis: Unsteadiness on feet (R26.81);Muscle weakness (generalized) (M62.81);History of falling (Z91.81)    Time: 1610-9604 PT Time Calculation (min) (ACUTE ONLY): 21 min   Charges:   PT Evaluation $PT Eval Moderate Complexity: 1 Mod   PT General Charges $$ ACUTE PT VISIT: 1 Visit         Maylon Peppers, PT, DPT Physical Therapist - [facility]  [facility]   Trinaty Bundrick A Tinie Mcgloin 08/20/2023, 10:24 AM

## 2023-08-20 NOTE — ED Notes (Addendum)
Patient continues to remove monitoring equipment, non-cooperative with blood draw without extensive coaching and redirection.

## 2023-08-20 NOTE — Discharge Summary (Signed)
Physician Discharge Summary   Patient: Dylan [name] MRN: [id number] DOB: 1934/01/07  Admit date:     08/19/2023  Discharge date: 08/20/23  Discharge Physician: Loyce Dys   PCP: Eustaquio Boyden, MD    Recommendations at discharge:  Follow-up with your cardiologist  Discharge Diagnoses: Syncope and collapse secondary to orthostatic hypotension Bradycardia Orthostatic hypotension Thrombocytopenia (HCC) Depression Chronic kidney disease, stage 3a (HCC) Frequent PVCs    Hospital Course: Dylan [name] is a 89 y.o. 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  His wife states that he has a history of recurrent syncopal episodes but that the episode today was different from his prior episodes. She states that he was sitting up in a chair and had just had breakfast.  She checked his blood pressure and it was 120/50 and so she gave him 1/2 tablet of midodrine 5 mg.  She had gone to recheck his blood pressure following administration of midodrine when he suddenly slumped in the chair and then his head fell back and he stopped responding.  She states that usually he regains consciousness quickly but that this took longer than normal and so she called 911.  Upon arrival to the emergency room patient underwent orthostatic blood pressure which was normal.  Patient was found to be mildly bradycardic so amiodarone was adjusted by cardiologist.  No more syncopal episodes recorded in the emergency room.  Patient underwent a walk test by physical therapist with no lightheadedness with no recommendation for home PT.  Telemetry was unrevealing.  Patient also underwent echocardiogram and now cleared for discharge today to  follow-up as an outpatient with PCP as well as his cardiologist.   Consultants: None Procedures performed: None Disposition: Home Diet recommendation:  Cardiac diet DISCHARGE MEDICATION:  DISCHARGE MEDICATION: Allergies as of 08/20/2023       Reactions   Doxepin Other (See Comments)   Lethargy, overly sedating at low dose   Seroquel [quetiapine] Other (See Comments)   Worsened agitation, increased BP   Lincomycin Hcl Rash        Medication List     TAKE these medications    acetaminophen 325 MG tablet Commonly known as: TYLENOL Take 2 tablets (650 mg total) by mouth every 6 (six) hours as needed for mild pain (or Fever >/= 101).   ALIGN PO Take by mouth daily.   amiodarone 100 MG tablet Commonly known as: PACERONE Take 1 tablet (100 mg total) by mouth daily. What changed:  medication strength how much to take how to take this when to take this additional instructions   aspirin EC 81 MG tablet Take 1 tablet (81 mg total) by mouth daily. Swallow whole.   atorvastatin 20 MG tablet Commonly known as: LIPITOR Take 1 tablet (20 mg total) by mouth daily.   cyanocobalamin 500 MCG tablet Commonly known as: V-R VITAMIN B-12 Take 1 tablet (500 mcg total) by mouth daily. What changed: when to take this   DULoxetine 20 MG capsule Commonly known as: CYMBALTA Take 2 capsules by mouth once daily   fexofenadine 180 MG tablet Commonly known as: Allegra Allergy Take 1 tablet (180 mg total) by mouth daily.   finasteride 5 MG tablet Commonly known as: PROSCAR Take 1 tablet by mouth once  daily   fludrocortisone 0.1 MG tablet Commonly known as: FLORINEF Take 1 tablet by mouth once daily   guaiFENesin 600 MG 12 hr tablet Commonly known as: MUCINEX Take 1,200 mg by mouth daily.   midodrine 5 MG tablet Commonly known as: PROAMATINE TAKE 1 TABLET BY MOUTH THREE TIMES DAILY WITH MEALS   MIRALAX PO Take 17 g by mouth daily.   SENOKOT PO Take 1 tablet by mouth at  bedtime.   Vitamin D3 25 MCG (1000 UT) Caps Take 1 capsule (1,000 Units total) by mouth daily.        Discharge Exam: Filed Weights   08/19/23 1156  Weight: 79.8 kg   Head: Normocephalic and atraumatic.     Nose: Nose normal.     Mouth/Throat:     Mouth: Mucous membranes are moist.  Eyes:     Conjunctiva/sclera: Conjunctivae normal.  Cardiovascular:     Rate and Rhythm: Bradycardia present.  Pulmonary:     Effort: Pulmonary effort is normal.     Breath sounds: Normal breath sounds.  Abdominal:     General: Abdomen is flat. Bowel sounds are normal.     Palpations: Abdomen is soft.  Musculoskeletal:        General: Normal range of motion.     Cervical back: Normal range of motion and neck supple.  Skin:    General: Skin is warm and dry.  Neurological:     Mental Status: He is alert.    Psychiatric:        Mood and Affect: Mood normal.        Behavior: Behavior normal.   Condition at discharge: good  The results of significant diagnostics from this hospitalization (including imaging, microbiology, ancillary and laboratory) are listed below for reference.   Imaging Studies: No results found.  Microbiology: Results for orders placed or performed during the hospital encounter of 05/16/22  Blood culture (routine x 2)     Status: None   Collection Time: 05/16/22  5:35 PM   Specimen: BLOOD  Result Value Ref Range Status   Specimen Description BLOOD BLRA  Final   Special Requests BOTTLES DRAWN AEROBIC AND ANAEROBIC BCHV  Final   Culture   Final    NO GROWTH 5 DAYS Performed at [facility], [address], [place], [place] [address]    Report Status 05/21/2022 FINAL  Final  Blood culture (routine x 2)     Status: None   Collection Time: 05/16/22  5:40 PM   Specimen: BLOOD  Result Value Ref Range Status   Specimen Description BLOOD RFOA  Final   Special Requests BOTTLES DRAWN AEROBIC AND ANAEROBIC BCAV  Final   Culture   Final    NO GROWTH 5  DAYS Performed at [facility], [address] [place], [place] [address]    Report Status 05/21/2022 FINAL  Final    Labs: CBC: Recent Labs  Lab 08/19/23 1157 08/20/23 0324  WBC 5.2 8.2  HGB 14.8 13.7  HCT 41.7 39.0  MCV 92.3 94.2  PLT 110* 103*   Basic Metabolic Panel: Recent Labs  Lab 08/19/23 1157 08/20/23 0409  NA 142 139  K 3.9 3.6  CL 105 102  CO2 30 31  GLUCOSE 101* 88  BUN 19 13  CREATININE 1.38* 1.27*  CALCIUM 8.6* 8.2*   Liver Function Tests: No results for input(s): "AST", "ALT", "ALKPHOS", "BILITOT", "PROT", "ALBUMIN" in the last 168 hours. CBG: No results for input(s): "GLUCAP" in  the last 168 hours.  Discharge time spent:  35 minutes.  Signed: Loyce Dys, MD [facility] 08/20/2023

## 2023-08-21 ENCOUNTER — Telehealth: Payer: Self-pay

## 2023-08-21 ENCOUNTER — Other Ambulatory Visit (HOSPITAL_COMMUNITY): Payer: Self-pay

## 2023-08-21 NOTE — Transitions of Care (Post Inpatient/ED Visit) (Signed)
08/21/2023  Name: Dylan [name] MRN: [id number] DOB: 03-10-1934  Today's TOC FU Call Status: Today's TOC FU Call Status:: Successful TOC FU Call Completed TOC FU Call Complete Date: 08/21/23 Patient's Name and Date of Birth confirmed.  Transition Care Management Follow-up Telephone Call Date of Discharge: 08/20/23 Discharge Facility: [facility]) Type of Discharge: Inpatient Admission Primary Inpatient Discharge Diagnosis:: syncope How have you been since you were released from the hospital?: Better Any questions or concerns?: No  Items Reviewed: Did you receive and understand the discharge instructions provided?: Yes Medications obtained,verified, and reconciled?: Yes (Medications Reviewed) Any new allergies since your discharge?: No Dietary orders reviewed?: Yes Do you have support at home?: Yes People in Home: spouse  Medications Reviewed Today: Medications Reviewed Today     Reviewed by Karena Addison, LPN (Licensed Practical Nurse) on 08/21/23 at 1201  Med List Status: <None>   Medication Order Taking? Sig Documenting Provider Last Dose Status Informant  acetaminophen (TYLENOL) 325 MG tablet [id number]  Take 2 tablets (650 mg total) by mouth every 6 (six) hours as needed for mild pain (or Fever >/= 101). Loyce Dys, MD  Active   amiodarone (PACERONE) 100 MG tablet [id number]  Take 1 tablet (100 mg total) by mouth daily. Loyce Dys, MD  Active   aspirin EC 81 MG tablet [id number] No Take 1 tablet (81 mg total) by mouth daily. Swallow whole. Eustaquio Boyden, MD 08/20/2023 Active Spouse/Significant Other  atorvastatin (LIPITOR) 20 MG tablet [id number] No Take 1 tablet (20 mg total) by mouth daily. Eustaquio Boyden, MD 08/20/2023 Active Spouse/Significant Other  Cholecalciferol (VITAMIN D3) 1000 units CAPS [id number] No Take 1 capsule (1,000 Units total) by mouth daily. Eustaquio Boyden, MD 08/20/2023 Active Spouse/Significant Other            Med Note Nedra Hai   Thu Apr 25, 2022  2:56 PM)    DULoxetine (CYMBALTA) 20 MG capsule [id number] No Take 2 capsules by mouth once daily Eustaquio Boyden, MD 08/20/2023 Active Spouse/Significant Other  fexofenadine (ALLEGRA ALLERGY) 180 MG tablet [id number] No Take 1 tablet (180 mg total) by mouth daily. Eustaquio Boyden, MD 08/20/2023 Active Spouse/Significant Other  finasteride (PROSCAR) 5 MG tablet [id number] No Take 1 tablet by mouth once daily Eustaquio Boyden, MD 08/20/2023 Active Spouse/Significant Other  fludrocortisone (FLORINEF) 0.1 MG tablet [id number] No Take 1 tablet by mouth once daily Regan Lemming, MD 08/20/2023 Active Spouse/Significant Other  guaiFENesin (MUCINEX) 600 MG 12 hr tablet [id number] No Take 1,200 mg by mouth daily. [provider] 08/20/2023 Active Spouse/Significant Other  midodrine (PROAMATINE) 5 MG tablet [id number] No TAKE 1 TABLET BY MOUTH THREE TIMES DAILY WITH MEALS Eustaquio Boyden, MD 08/20/2023 Active Spouse/Significant Other  Polyethylene Glycol 3350 (MIRALAX PO) [id number] No Take 17 g by mouth daily. [provider] 08/20/2023 Active Spouse/Significant Other           Med Note Nedra Hai   Thu Apr 25, 2022  2:56 PM)    Probiotic Product (ALIGN PO) [id number] No Take by mouth daily. [provider] 08/19/2023 Active Spouse/Significant Other  Sennosides (SENOKOT PO) [id number] No Take 1 tablet by mouth at bedtime. [provider] 08/19/2023 Active Spouse/Significant Other           Med Note Anselm Pancoast R   Thu Apr 25, 2022  2:56 PM)    vitamin B-12 (V-R VITAMIN B-12) 500 MCG tablet [id number] No Take 1 tablet (500 mcg total) by mouth daily.  Patient taking differently: Take 500 mcg by mouth at bedtime.   Eustaquio Boyden, MD 08/20/2023 Active Spouse/Significant Other           Med Note Nedra Hai   Thu Apr 25, 2022  2:56 PM)              Home Care and Equipment/Supplies: Were Home Health  Services Ordered?: Yes Name of Home Health Agency:: [facility] Has Agency set up a time to come to your home?: Yes First Home Health Visit Date: 08/21/23  Functional Questionnaire: Do you need assistance with bathing/showering or dressing?: Yes Do you need assistance with meal preparation?: Yes Do you have difficulty maintaining continence: No Do you need assistance with getting out of bed/getting out of a chair/moving?: No Do you have difficulty managing or taking your medications?: Yes  Follow up appointments reviewed: PCP Follow-up appointment confirmed?: Yes Date of PCP follow-up appointment?: 08/27/23 Follow-up Provider: [facility] Follow-up appointment confirmed?: No Reason Specialist Follow-Up Not Confirmed: Patient has Specialist Provider Number and will Call for Appointment Do you need transportation to your follow-up appointment?: No Do you understand care options if your condition(s) worsen?: Yes-patient verbalized understanding    SIGNATURE Karena Addison, LPN [facility] Nurse Health Advisor Direct Dial [phone]

## 2023-08-27 ENCOUNTER — Ambulatory Visit: Payer: Medicare Other | Admitting: Family Medicine

## 2023-09-01 ENCOUNTER — Emergency Department: Payer: Medicare Other

## 2023-09-01 ENCOUNTER — Other Ambulatory Visit: Payer: Self-pay

## 2023-09-01 ENCOUNTER — Emergency Department
Admission: EM | Admit: 2023-09-01 | Discharge: 2023-09-01 | Disposition: A | Discharge status: Home / Self Care | Payer: Medicare Other | Attending: Student in an Organized Health Care Education/Training Program | Admitting: Student in an Organized Health Care Education/Training Program

## 2023-09-01 DIAGNOSIS — R55 Syncope and collapse: Secondary | ICD-10-CM | POA: Diagnosis not present

## 2023-09-01 DIAGNOSIS — R001 Bradycardia, unspecified: Secondary | ICD-10-CM | POA: Diagnosis not present

## 2023-09-01 DIAGNOSIS — R9089 Other abnormal findings on diagnostic imaging of central nervous system: Secondary | ICD-10-CM | POA: Diagnosis not present

## 2023-09-01 DIAGNOSIS — I959 Hypotension, unspecified: Secondary | ICD-10-CM | POA: Diagnosis not present

## 2023-09-01 DIAGNOSIS — I6782 Cerebral ischemia: Secondary | ICD-10-CM | POA: Diagnosis not present

## 2023-09-01 DIAGNOSIS — Z043 Encounter for examination and observation following other accident: Secondary | ICD-10-CM | POA: Diagnosis not present

## 2023-09-01 DIAGNOSIS — F0390 Unspecified dementia without behavioral disturbance: Secondary | ICD-10-CM | POA: Insufficient documentation

## 2023-09-01 LAB — CBC
HCT: 44 % (ref 39.0–52.0)
Hemoglobin: 15.4 g/dL (ref 13.0–17.0)
MCH: 32.9 pg (ref 26.0–34.0)
MCHC: 35 g/dL (ref 30.0–36.0)
MCV: 94 fL (ref 80.0–100.0)
Platelets: 112 10*3/uL — ABNORMAL LOW (ref 150–400)
RBC: 4.68 MIL/uL (ref 4.22–5.81)
RDW: 13.6 % (ref 11.5–15.5)
WBC: 6.3 10*3/uL (ref 4.0–10.5)
nRBC: 0 % (ref 0.0–0.2)

## 2023-09-01 LAB — BASIC METABOLIC PANEL
Anion gap: 7 (ref 5–15)
BUN: 15 mg/dL (ref 8–23)
CO2: 30 mmol/L (ref 22–32)
Calcium: 8.5 mg/dL — ABNORMAL LOW (ref 8.9–10.3)
Chloride: 102 mmol/L (ref 98–111)
Creatinine, Ser: 1.31 mg/dL — ABNORMAL HIGH (ref 0.61–1.24)
GFR, Estimated: 53 mL/min — ABNORMAL LOW (ref >60)
Glucose, Bld: 102 mg/dL — ABNORMAL HIGH (ref 70–99)
Potassium: 3.2 mmol/L — ABNORMAL LOW (ref 3.5–5.1)
Sodium: 139 mmol/L (ref 135–145)

## 2023-09-01 LAB — TROPONIN I (HIGH SENSITIVITY)
Troponin I (High Sensitivity): 14 ng/L (ref <18)
Troponin I (High Sensitivity): 14 ng/L (ref <18)

## 2023-09-01 NOTE — ED Provider Notes (Signed)
[facility] Provider Note    Event Date/Time   First MD Initiated Contact with Patient 09/01/23 [phone]     (approximate)   History   Loss of Consciousness   HPI  Dylan [name] is a 89 y.o. male extensive past medical history on palliative care presents to the ER for evaluation of syncopal episode that occurred this morning after getting out of bed.  Has had multiple episodes similar to this in the past with recent admission 2 weeks ago for same.  Patient unable to provide much additional history due to dementia.  Majority of history past medical history provided by family member at bedside who was present during this morning's event.     Physical Exam   Triage Vital Signs: ED Triage Vitals  Encounter Vitals Group     BP 09/01/23 0842 (!) 151/72     Systolic BP Percentile --      Diastolic BP Percentile --      Pulse Rate 09/01/23 0842 (!) 45     Resp 09/01/23 0842 16     Temp --      Temp src --      SpO2 09/01/23 0842 95 %     Weight 09/01/23 0845 172 lb (78 kg)     Height 09/01/23 0845 6' (1.829 m)     Head Circumference --      Peak Flow --      Pain Score 09/01/23 0835 0     Pain Loc --      Pain Education --      Exclude from Growth Chart --     Most recent vital signs: Vitals:   09/01/23 1400 09/01/23 1430  BP: (!) 146/77 (!) 147/74  Pulse: (!) 51 (!) 52  Resp: 10 15  Temp:    SpO2: 96% 94%     Constitutional: Alert  Eyes: Conjunctivae are normal.  Head: Atraumatic. Nose: No congestion/rhinnorhea. Mouth/Throat: Mucous membranes are moist.   Neck: Painless ROM.  Cardiovascular:   Good peripheral circulation. Respiratory: Normal respiratory effort.  No retractions.  Gastrointestinal: Soft and nontender.  Musculoskeletal:  no deformity Neurologic:  MAE spontaneously. No gross focal neurologic deficits are appreciated.  Skin:  Skin is warm, dry and intact. No rash noted. Psychiatric: Mood and affect are normal. Speech and  behavior are normal.    ED Results / Procedures / Treatments   Labs (all labs ordered are listed, but only abnormal results are displayed) Labs Reviewed  BASIC METABOLIC PANEL - Abnormal; Notable for the following components:      Result Value   Potassium 3.2 (*)    Glucose, Bld 102 (*)    Creatinine, Ser 1.31 (*)    Calcium 8.5 (*)    GFR, Estimated 53 (*)    All other components within normal limits  CBC - Abnormal; Notable for the following components:   Platelets 112 (*)    All other components within normal limits  URINALYSIS, ROUTINE W REFLEX MICROSCOPIC  TROPONIN I (HIGH SENSITIVITY)  TROPONIN I (HIGH SENSITIVITY)     EKG  ED ECG REPORT I, Willy Eddy, the attending physician, personally viewed and interpreted this ECG.   Date: 09/01/2023  EKG Time: 8:50  Rate: 45  Rhythm: sinus  Axis: left  Intervals:rbbb  ST&T Change: no stemi    RADIOLOGY Please see ED Course for my review and interpretation.  I personally reviewed all radiographic images ordered to evaluate for the above acute  complaints and reviewed radiology reports and findings.  These findings were personally discussed with the patient.  Please see medical record for radiology report.    PROCEDURES:  Critical Care performed:   Procedures   MEDICATIONS ORDERED IN ED: Medications - No data to display   IMPRESSION / MDM / ASSESSMENT AND PLAN / ED COURSE  I reviewed the triage vital signs and the nursing notes.                              Differential diagnosis includes, but is not limited to, dysrhythmia, bradycardia, medication effect, orthostasis, anemia, sepsis, CVA, seizure  Patient presenting to the ER for evaluation of symptoms as described above.  Based on symptoms, risk factors and considered above differential, this presenting complaint could reflect a potentially life-threatening illness therefore the patient will be placed on continuous pulse oximetry and telemetry for  monitoring.  Laboratory evaluation will be sent to evaluate for the above complaints.      Clinical Course as of 09/01/23 1520  Mon Sep 01, 2023  1610 Chest x-ray on my review and interpretation without evidence of consolidation. [PR]  1447 Patient has been observed in the ER.  Remains hemodynamically stable.  Normotensive.  Heart rate is trending upwards.  Was seen by cardiology within the past year noted to be bradycardic 45 was kept on the same medications.  Patient remains asymptomatic at this time.  No signs or symptoms of sepsis or infection.  Discussed with family option for further observation versus outpatient follow-up and further discussion with cardiology regarding amiodarone.  Family comfortable plan for outpatient follow-up. [PR]    Clinical Course User Index [PR] Willy Eddy, MD     FINAL CLINICAL IMPRESSION(S) / ED DIAGNOSES   Final diagnoses:  Syncope, unspecified syncope type  Bradycardia     Rx / DC Orders   ED Discharge Orders     None        Note:  This document was prepared using Dragon voice recognition software and may include unintentional dictation errors.    Willy Eddy, MD 09/01/23 1520

## 2023-09-01 NOTE — ED Notes (Signed)
This tech took pt back to room 4. While standing him up to get to the stretcher, this tech noticed his T-shirt and brief were saturated in urine. This tech changed the pt into a gown, performed peri care, put a new brief on the pt, and put warm blankets on the pt. This tech also hooked the pt up to the monitor. This tech notified RN and made sure pt was comfortable with family member at bedside.

## 2023-09-01 NOTE — ED Notes (Signed)
Unable to collect blood work.

## 2023-09-01 NOTE — ED Triage Notes (Signed)
Pt comes via EMS from home with c/o syncopal episode. Pt smells like strong urine. Pt was sitting in chair and came back pt was on floor. Pt does have dementia. Pt SB on monitor CBG -120

## 2023-09-01 NOTE — ED Notes (Signed)
Pt taken to Ct and then they will bring pt to ED 4.

## 2023-09-01 NOTE — ED Notes (Signed)
Unable to get a oral or axillary tempeture at this time. First RN notified. PT currently wrapped in blankets.

## 2023-09-03 ENCOUNTER — Ambulatory Visit (INDEPENDENT_AMBULATORY_CARE_PROVIDER_SITE_OTHER): Payer: Medicare Other | Admitting: Family Medicine

## 2023-09-03 ENCOUNTER — Encounter: Payer: Self-pay | Admitting: Family Medicine

## 2023-09-03 VITALS — BP 156/72 | HR 50 | Temp 97.4°F | Ht 72.0 in | Wt 175.4 lb

## 2023-09-03 DIAGNOSIS — I493 Ventricular premature depolarization: Secondary | ICD-10-CM | POA: Diagnosis not present

## 2023-09-03 DIAGNOSIS — I340 Nonrheumatic mitral (valve) insufficiency: Secondary | ICD-10-CM

## 2023-09-03 DIAGNOSIS — G301 Alzheimer's disease with late onset: Secondary | ICD-10-CM | POA: Diagnosis not present

## 2023-09-03 DIAGNOSIS — I4892 Unspecified atrial flutter: Secondary | ICD-10-CM | POA: Diagnosis not present

## 2023-09-03 DIAGNOSIS — I951 Orthostatic hypotension: Secondary | ICD-10-CM | POA: Diagnosis not present

## 2023-09-03 DIAGNOSIS — R55 Syncope and collapse: Secondary | ICD-10-CM

## 2023-09-03 DIAGNOSIS — R001 Bradycardia, unspecified: Secondary | ICD-10-CM | POA: Diagnosis not present

## 2023-09-03 DIAGNOSIS — F02C18 Dementia in other diseases classified elsewhere, severe, with other behavioral disturbance: Secondary | ICD-10-CM

## 2023-09-03 DIAGNOSIS — F66 Other sexual disorders: Secondary | ICD-10-CM

## 2023-09-03 DIAGNOSIS — I452 Bifascicular block: Secondary | ICD-10-CM | POA: Diagnosis not present

## 2023-09-03 NOTE — Patient Instructions (Addendum)
Ok to stay off amiodarone and touch base with cardiology team next month about need for this medicine.  If we stay off amiodarone, let me know and we will restart prozac in place of Cymbalta (more effective).  We will recheck blood work next month at your physical.  We will consider hospice evaluation.

## 2023-09-03 NOTE — Progress Notes (Addendum)
Ph: [phone] Fax: [phone]   Patient ID: Dylan [name], male    DOB: 07-Mar-1934, 89 y.o.   MRN: [id number]  This visit was conducted in person.  BP (!) 156/72   Pulse (!) 50   Temp (!) 97.4 F (36.3 C) (Temporal)   Ht 6' (1.829 m)   Wt 175 lb 6 oz (79.5 kg)   SpO2 95%   BMI 23.79 kg/m   BP Readings from Last 3 Encounters:  09/03/23 (!) 156/72  09/01/23 (!) 147/74  08/20/23 (!) 145/60    No data found.  CC: ER f/u visit  Subjective:   HPI: Dylan [name] is a 89 y.o. male presenting on 09/03/2023 for Hospitalization Follow-up (Seen on 09/01/23 at [facility] ED, dx syncope; bradycardia. Pt accompanied by wife, [name]. States ER doc suggested pt needs to come off amiodarone. )   Has had 2 ER visits for syncope this month.  Records reviewed.  First ER visit 08/19/2023 - syncope, loss of consciousness while sitting after breakfast, did vomit at that time. Treated with IVF and home midodrine. Echocardiogram 08/2023 overall reassuring, did show mild-mod MVR wih mild late systolic prolapse of both leaflets.  Second ER visit 09/01/2023 - syncopal while getting out of bed in am, no vomiting but did have diaphoresis. Imaging reassuring including head and neck CT and CXR. Cardiology consult - rec to consider lower dose amiodarone vs coming off altogether.  H/o bradycardia, RBBB/L intrafascicular block.   Amiodarone was started 10/2022 for high burden of symptomatic PVCs. H/o implanted loop recorder, device now at end of life. No dyspnea or hypothyroid symptoms. He continues 200mg  daily.  Wife feels syncopal spells are progressively worsening, more intense. She's since stopped amiodarone.  Lab Results  Component Value Date   TSH 1.900 02/20/2023  PORTABLE CHEST - 1 VIEW COMPARISON:  10/01/2022 FINDINGS: Cardiomediastinal silhouette and pulmonary vasculature are within normal limits. Lungs are clear. Cardiac monitor device again seen overlying the left chest. IMPRESSION: No  acute cardiopulmonary process.  Electronically Signed   By: Acquanetta Belling M.D.   On: 09/01/2023 10:24  Does have upcoming cardiology appointment 09/17/2023.   He continues fludrocortisone 0.1mg  daily in am along with midodrine 5mg  TID with meals (7am, 12pm, 5pm).   Known orthostasis with prior syncope due to autonomic instability thought not candidate for pacemaker due to comorbidities. Rec continued abdominal binder, continued increased salt intake. Currently managed with fludrocortisone 0.1mg  daily, midodrine 5mg  TID with meals, first dose upon awakening. Last visit we increased first midodrine dose to 7.5mg  (using higher dose intermittently).    Saw cardiology Dr Allyson Sabal for peripheral vascular disease evaluation - given comorbidities and no critical limb ischemia, rec conservative management.   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 Therefore not on aricept (also concern for altered cardiac conduction side effect).   Continues [facility] adult daycare     Relevant past medical, surgical, family and social history reviewed and updated as indicated. Interim medical history since our last visit reviewed. Allergies and medications reviewed and updated. Outpatient Medications Prior to Visit  Medication Sig Dispense Refill   acetaminophen (TYLENOL) 325 MG tablet Take 2 tablets (650 mg total) by mouth every 6 (six) hours as needed for mild pain (or Fever >/= 101). 20 tablet 0   aspirin EC 81 MG tablet Take 1 tablet (81 mg total) by mouth daily. Swallow whole.     atorvastatin (LIPITOR) 20 MG  tablet Take 1 tablet (20 mg total) by mouth daily. 90 tablet 1   Cholecalciferol (VITAMIN D3) 1000 units CAPS Take 1 capsule (1,000 Units total) by mouth daily. 30 capsule    DULoxetine (CYMBALTA) 20 MG capsule Take 2 capsules by mouth once daily 60 capsule 3   fexofenadine (ALLEGRA ALLERGY) 180 MG tablet Take 1 tablet (180 mg  total) by mouth daily.     finasteride (PROSCAR) 5 MG tablet Take 1 tablet by mouth once daily 90 tablet 3   fludrocortisone (FLORINEF) 0.1 MG tablet Take 1 tablet by mouth once daily 90 tablet 2   guaiFENesin (MUCINEX) 600 MG 12 hr tablet Take 1,200 mg by mouth daily.     midodrine (PROAMATINE) 5 MG tablet TAKE 1 TABLET BY MOUTH THREE TIMES DAILY WITH MEALS 90 tablet 3   Polyethylene Glycol 3350 (MIRALAX PO) Take 17 g by mouth daily.     Probiotic Product (ALIGN PO) Take by mouth daily.     Sennosides (SENOKOT PO) Take 1 tablet by mouth at bedtime.     vitamin B-12 (V-R VITAMIN B-12) 500 MCG tablet Take 1 tablet (500 mcg total) by mouth daily. (Patient taking differently: Take 500 mcg by mouth at bedtime.)     amiodarone (PACERONE) 100 MG tablet Take 1 tablet (100 mg total) by mouth daily. 30 tablet 1   No facility-administered medications prior to visit.     Per HPI unless specifically indicated in ROS section below Review of Systems  Objective:  BP (!) 156/72   Pulse (!) 50   Temp (!) 97.4 F (36.3 C) (Temporal)   Ht 6' (1.829 m)   Wt 175 lb 6 oz (79.5 kg)   SpO2 95%   BMI 23.79 kg/m   Wt Readings from Last 3 Encounters:  09/03/23 175 lb 6 oz (79.5 kg)  09/01/23 172 lb (78 kg)  08/19/23 176 lb (79.8 kg)      Physical Exam Vitals and nursing note reviewed.  Constitutional:      Appearance: Normal appearance. He is not ill-appearing.  HENT:     Head: Normocephalic and atraumatic.  Cardiovascular:     Rate and Rhythm: Normal rate and regular rhythm.     Pulses: Normal pulses.     Heart sounds: Normal heart sounds. No murmur heard. Pulmonary:     Effort: Pulmonary effort is normal. No respiratory distress.     Breath sounds: Normal breath sounds. No wheezing or rhonchi.  Musculoskeletal:     Right lower leg: No edema.     Left lower leg: No edema.  Skin:    General: Skin is warm and dry.     Findings: No rash.  Neurological:     Mental Status: He is alert.   Psychiatric:        Mood and Affect: Mood normal.        Behavior: Behavior normal.       Results for orders placed or performed during the hospital encounter of 09/01/23  Basic metabolic panel  Result Value Ref Range   Sodium 139 135 - 145 mmol/L   Potassium 3.2 (L) 3.5 - 5.1 mmol/L   Chloride 102 98 - 111 mmol/L   CO2 30 22 - 32 mmol/L   Glucose, Bld 102 (H) 70 - 99 mg/dL   BUN 15 8 - 23 mg/dL   Creatinine, Ser 1.61 (H) 0.61 - 1.24 mg/dL   Calcium 8.5 (L) 8.9 - 10.3 mg/dL   GFR, Estimated 53 (L) >  60 mL/min   Anion gap 7 5 - 15  CBC  Result Value Ref Range   WBC 6.3 4.0 - 10.5 K/uL   RBC 4.68 4.22 - 5.81 MIL/uL   Hemoglobin 15.4 13.0 - 17.0 g/dL   HCT 78.2 95.6 - 21.3 %   MCV 94.0 80.0 - 100.0 fL   MCH 32.9 26.0 - 34.0 pg   MCHC 35.0 30.0 - 36.0 g/dL   RDW 08.6 57.8 - 46.9 %   Platelets 112 (L) 150 - 400 K/uL   nRBC 0.0 0.0 - 0.2 %  Troponin I (High Sensitivity)  Result Value Ref Range   Troponin I (High Sensitivity) 14 <18 ng/L  Troponin I (High Sensitivity)  Result Value Ref Range   Troponin I (High Sensitivity) 14 <18 ng/L   Lab Results  Component Value Date   VITAMINB12 723 08/20/2021   Lab Results  Component Value Date   VD25OH 54.44 08/20/2021   Assessment & Plan:   Problem List Items Addressed This Visit     Right bundle branch block with left anterior fascicular block   Dementia (HCC)    bvFTD based on symptoms and imaging, with possible Alz disease component.  Continues at [facility] adult memory daycare.  This complicates care.  If ongoing syncope and wife wants to avoid ER eval/hospitalization, discussed option of hospice involvement.       Recurrent syncope - Primary    2 ER visits this past month.  Wife has decided to stop amiodarone.  Pending EP/cardiology f/u.      Sexual disinhibition    Better controlled while on prozac, no significant benefit with cymbalta.  If amiodarone stopped, would return to Prozac which was more effective in  controlling symptoms of sexual disinhibition.       Orthostatic hypotension    Continues fludrocortisone 0.1mg  daily in am along with midodrine 5mg  TID with meals (7am, 12pm, 5pm).       Symptomatic bradycardia   Paroxysmal atrial flutter (HCC)    Off eliquis due to fall risk  Now off amiodarone as per above.       Orthostatic syncope   Frequent PVCs    Amiodarone started for frequent symptomatic PVCs and h/o SVT with benefit - however wife concerned this may be contributing to bradycardia and syncope as syncope has been worsening since on this so she desires  to stay off amiodarone. Will hold until cardiology appt next week.       Mitral regurgitation     No orders of the defined types were placed in this encounter.   No orders of the defined types were placed in this encounter.   Patient Instructions  Ok to stay off amiodarone and touch base with cardiology team next month about need for this medicine.  If we stay off amiodarone, let me know and we will restart prozac in place of Cymbalta (more effective).  We will recheck blood work next month at your physical.  We will consider hospice evaluation.   Follow up plan: Return if symptoms worsen or fail to improve.  Eustaquio Boyden, MD

## 2023-09-06 ENCOUNTER — Encounter: Payer: Self-pay | Admitting: Family Medicine

## 2023-09-06 DIAGNOSIS — I340 Nonrheumatic mitral (valve) insufficiency: Secondary | ICD-10-CM | POA: Insufficient documentation

## 2023-09-06 NOTE — Assessment & Plan Note (Addendum)
Amiodarone started for frequent symptomatic PVCs and h/o SVT with benefit - however wife concerned this may be contributing to bradycardia and syncope as syncope has been worsening since on this so she desires  to stay off amiodarone. Will hold until cardiology appt next week.

## 2023-09-06 NOTE — Assessment & Plan Note (Signed)
Off eliquis due to fall risk  Now off amiodarone as per above.

## 2023-09-06 NOTE — Assessment & Plan Note (Addendum)
bvFTD based on symptoms and imaging, with possible Alz disease component.  Continues at [facility] adult memory daycare.  This complicates care.  If ongoing syncope and wife wants to avoid ER eval/hospitalization, discussed option of hospice involvement.

## 2023-09-06 NOTE — Assessment & Plan Note (Signed)
Continues fludrocortisone 0.1mg  daily in am along with midodrine 5mg  TID with meals (7am, 12pm, 5pm).

## 2023-09-06 NOTE — Assessment & Plan Note (Signed)
Better controlled while on prozac, no significant benefit with cymbalta.  If amiodarone stopped, would return to Prozac which was more effective in controlling symptoms of sexual disinhibition.

## 2023-09-06 NOTE — Assessment & Plan Note (Signed)
2 ER visits this past month.  Wife has decided to stop amiodarone.  Pending EP/cardiology f/u.

## 2023-09-11 ENCOUNTER — Ambulatory Visit: Payer: Medicare Other | Admitting: Dermatology

## 2023-09-11 ENCOUNTER — Encounter: Payer: Self-pay | Admitting: Dermatology

## 2023-09-11 DIAGNOSIS — L578 Other skin changes due to chronic exposure to nonionizing radiation: Secondary | ICD-10-CM | POA: Diagnosis not present

## 2023-09-11 DIAGNOSIS — D692 Other nonthrombocytopenic purpura: Secondary | ICD-10-CM | POA: Diagnosis not present

## 2023-09-11 DIAGNOSIS — Z1283 Encounter for screening for malignant neoplasm of skin: Secondary | ICD-10-CM | POA: Diagnosis not present

## 2023-09-11 DIAGNOSIS — L820 Inflamed seborrheic keratosis: Secondary | ICD-10-CM | POA: Diagnosis not present

## 2023-09-11 DIAGNOSIS — L821 Other seborrheic keratosis: Secondary | ICD-10-CM

## 2023-09-11 DIAGNOSIS — W908XXA Exposure to other nonionizing radiation, initial encounter: Secondary | ICD-10-CM | POA: Diagnosis not present

## 2023-09-11 DIAGNOSIS — Z85828 Personal history of other malignant neoplasm of skin: Secondary | ICD-10-CM | POA: Diagnosis not present

## 2023-09-11 DIAGNOSIS — D229 Melanocytic nevi, unspecified: Secondary | ICD-10-CM

## 2023-09-11 DIAGNOSIS — L918 Other hypertrophic disorders of the skin: Secondary | ICD-10-CM | POA: Diagnosis not present

## 2023-09-11 DIAGNOSIS — L814 Other melanin hyperpigmentation: Secondary | ICD-10-CM | POA: Diagnosis not present

## 2023-09-11 DIAGNOSIS — D1801 Hemangioma of skin and subcutaneous tissue: Secondary | ICD-10-CM

## 2023-09-11 NOTE — Patient Instructions (Signed)
Cryotherapy Aftercare  Wash gently with soap and water everyday.   Apply Vaseline and Band-Aid daily until healed.   Wound Care Instructions  Cleanse wound gently with soap and water once a day then pat dry with clean gauze. Apply a thin coat of Petrolatum (petroleum jelly, "Vaseline") over the wound (unless you have an allergy to this). We recommend that you use a new, sterile tube of Vaseline. Do not pick or remove scabs. Do not remove the yellow or white "healing tissue" from the base of the wound.  Cover the wound with fresh, clean, nonstick gauze and secure with paper tape. You may use Band-Aids in place of gauze and tape if the wound is small enough, but would recommend trimming much of the tape off as there is often too much. Sometimes Band-Aids can irritate the skin.  You should call the office for your biopsy report after 1 week if you have not already been contacted.  If you experience any problems, such as abnormal amounts of bleeding, swelling, significant bruising, significant pain, or evidence of infection, please call the office immediately.  FOR ADULT SURGERY PATIENTS: If you need something for pain relief you may take 1 extra strength Tylenol (acetaminophen) AND 2 Ibuprofen (200mg  each) together every 4 hours as needed for pain. (do not take these if you are allergic to them or if you have a reason you should not take them.) Typically, you may only need pain medication for 1 to 3 days.      Recommend daily broad spectrum sunscreen SPF 30+ to sun-exposed areas, reapply every 2 hours as needed. Call for new or changing lesions.  Staying in the shade or wearing long sleeves, sun glasses (UVA+UVB protection) and wide brim hats (4-inch brim around the entire circumference of the hat) are also recommended for sun protection.    Melanoma ABCDEs  Melanoma is the most dangerous type of skin cancer, and is the leading cause of death from skin disease.  You are more likely to develop  melanoma if you: Have light-colored skin, light-colored eyes, or red or blond hair Spend a lot of time in the sun Tan regularly, either outdoors or in a tanning bed Have had blistering sunburns, especially during childhood Have a close family member who has had a melanoma Have atypical moles or large birthmarks  Early detection of melanoma is key since treatment is typically straightforward and cure rates are extremely high if we catch it early.   The first sign of melanoma is often a change in a mole or a new dark spot.  The ABCDE system is a way of remembering the signs of melanoma.  A for asymmetry:  The two halves do not match. B for border:  The edges of the growth are irregular. C for color:  A mixture of colors are present instead of an even brown color. D for diameter:  Melanomas are usually (but not always) greater than 6mm - the size of a pencil eraser. E for evolution:  The spot keeps changing in size, shape, and color.  Please check your skin once per month between visits. You can use a small mirror in front and a large mirror behind you to keep an eye on the back side or your body.   If you see any new or changing lesions before your next follow-up, please call to schedule a visit.  Please continue daily skin protection including broad spectrum sunscreen SPF 30+ to sun-exposed areas, reapplying every 2 hours  as needed when you're outdoors.   Staying in the shade or wearing long sleeves, sun glasses (UVA+UVB protection) and wide brim hats (4-inch brim around the entire circumference of the hat) are also recommended for sun protection.    Due to recent changes in healthcare laws, you may see results of your pathology and/or laboratory studies on MyChart before the doctors have had a chance to review them. We understand that in some cases there may be results that are confusing or concerning to you. Please understand that not all results are received at the same time and often the  doctors may need to interpret multiple results in order to provide you with the best plan of care or course of treatment. Therefore, we ask that you please give [place] 2 business days to thoroughly review all your results before contacting the office for clarification. Should we see a critical lab result, you will be contacted sooner.   If You Need Anything After Your Visit  If you have any questions or concerns for your doctor, please call our main line at [phone] and press option 4 to reach your doctor's medical assistant. If no one answers, please leave a voicemail as directed and we will return your call as soon as possible. Messages left after 4 pm will be answered the following business day.   You may also send [place] a message via MyChart. We typically respond to MyChart messages within 1-2 business days.  For prescription refills, please ask your pharmacy to contact our office. Our fax number is [phone].  If you have an urgent issue when the clinic is closed that cannot wait until the next business day, you can page your doctor at the number below.    Please note that while we do our best to be available for urgent issues outside of office hours, we are not available 24/7.   If you have an urgent issue and are unable to reach [place], you may choose to seek medical care at your doctor's office, retail clinic, urgent care center, or emergency room.  If you have a medical emergency, please immediately call 911 or go to the emergency department.  Pager Numbers  - Dr. Gwen Pounds: [phone]  - Dr. Roseanne Reno: [phone]  - Dr. Katrinka Blazing: [phone]   In the event of inclement weather, please call our main line at [phone] for an update on the status of any delays or closures.  Dermatology Medication Tips: Please keep the boxes that topical medications come in in order to help keep track of the instructions about where and how to use these. Pharmacies typically print the medication  instructions only on the boxes and not directly on the medication tubes.   If your medication is too expensive, please contact our office at [phone] option 4 or send [place] a message through MyChart.   We are unable to tell what your co-pay for medications will be in advance as this is different depending on your insurance coverage. However, we may be able to find a substitute medication at lower cost or fill out paperwork to get insurance to cover a needed medication.   If a prior authorization is required to get your medication covered by your insurance company, please allow [place] 1-2 business days to complete this process.  Drug prices often vary depending on where the prescription is filled and some pharmacies may offer cheaper prices.  The website www.goodrx.com contains coupons for medications through different pharmacies. The prices here do not account for  what the cost may be with help from insurance (it may be cheaper with your insurance), but the website can give you the price if you did not use any insurance.  - You can print the associated coupon and take it with your prescription to the pharmacy.  - You may also stop by our office during regular business hours and pick up a GoodRx coupon card.  - If you need your prescription sent electronically to a different pharmacy, notify our office through [facility] or by phone at [phone] option 4.     Si Usted Necesita Algo Despus de Su Visita  Tambin puede enviarnos un mensaje a travs de Clinical cytogeneticist. Por lo general respondemos a los mensajes de MyChart en el transcurso de 1 a 2 das hbiles.  Para renovar recetas, por favor pida a su farmacia que se ponga en contacto con nuestra oficina. Annie Sable de fax es [place] [phone].  Si tiene un asunto urgente cuando la clnica est cerrada y que no puede esperar hasta el siguiente da hbil, puede llamar/localizar a su doctor(a) al nmero que aparece a continuacin.   Por  favor, tenga en cuenta que aunque hacemos todo lo posible para estar disponibles para asuntos urgentes fuera del horario de [place], no estamos disponibles las 24 horas del da, los 7 [address]  [address] de la [place].   Si tiene un problema urgente y no puede comunicarse con nosotros, puede optar por buscar atencin mdica  en el consultorio de su doctor(a), en una clnica privada, en un centro de atencin urgente o en una sala de emergencias.  Si tiene Engineer, drilling, por favor llame inmediatamente al 911 o vaya a la sala de emergencias.  Nmeros de bper  - Dr. Gwen Pounds: [phone]  - Dra. Roseanne Reno: [phone]  - Dr. Katrinka Blazing: [phone]   En caso de inclemencias del tiempo, por favor llame a Lacy Duverney principal al [phone] para una actualizacin sobre el [place] de cualquier retraso o cierre.  Consejos para la medicacin en dermatologa: Por favor, guarde las cajas en las que vienen los medicamentos de uso tpico para ayudarle a seguir las instrucciones sobre dnde y cmo usarlos. Las farmacias generalmente imprimen las instrucciones del medicamento slo en las cajas y no directamente en los tubos del [place].   Si su medicamento es muy caro, por favor, pngase en contacto con Rolm Gala llamando al [phone] y presione la opcin 4 o envenos un mensaje a travs de Clinical cytogeneticist.   No podemos decirle cul ser su copago por los medicamentos por adelantado ya que esto es diferente dependiendo de la cobertura de su seguro. Sin embargo, es posible que podamos encontrar un medicamento sustituto a Audiological scientist un formulario para que el seguro cubra el medicamento que se considera necesario.   Si se requiere una autorizacin previa para que su compaa de seguros [place] su medicamento, por favor permtanos de 1 a 2 das hbiles para completar [address]  Los precios de los medicamentos varan con frecuencia dependiendo del Environmental consultant de dnde se surte la receta y alguna farmacias  pueden ofrecer precios ms baratos.  El sitio web www.goodrx.com tiene cupones para medicamentos de Health and safety inspector. Los precios aqu no tienen en cuenta lo que podra costar con la ayuda del seguro (puede ser ms barato con su seguro), pero el sitio web puede darle el precio si no utiliz Tourist information centre manager.  - Puede imprimir el cupn correspondiente y llevarlo con su receta a la farmacia.  Laroy Apple  puede pasar por nuestra oficina durante el horario de atencin regular y Education officer, museum una tarjeta de cupones de GoodRx.  - Si necesita que su receta se enve electrnicamente a una farmacia diferente, informe a nuestra oficina a travs de MyChart de [place] o por telfono llamando al [phone] y presione la opcin 4.

## 2023-09-11 NOTE — Progress Notes (Signed)
Follow-Up Visit   Subjective  Dylan [name] is a 89 y.o. male who presents for the following: Skin Cancer Screening and Upper Body Skin Exam. Hx of BCCs. Areas of concern on back.  Wife states his belly button is very inflamed. Dur: few days.   The patient presents for Upper Body Skin Exam (UBSE) for skin cancer screening and mole check. The patient has spots, moles and lesions to be evaluated, some may be new or changing and the patient may have concern these could be cancer.  Wife is with patient and contributes to history.   The following portions of the chart were reviewed this encounter and updated as appropriate: medications, allergies, medical history  Review of Systems:  No other skin or systemic complaints except as noted in HPI or Assessment and Plan.  Objective  Well appearing patient in no apparent distress; mood and affect are within normal limits.  All skin waist up examined. Relevant physical exam findings are noted in the Assessment and Plan.  Back x3 Erythematous keratotic or waxy stuck-on papule or plaque.  Right Anterior Neck Fleshy, erythematous pedunculated papule    Assessment & Plan   HISTORY OF BASAL CELL CARCINOMA OF THE SKIN. Mid forehead ED&C 2021 Right nasal sidewall 12/2014 Mohs - No evidence of recurrence today - Recommend regular full body skin exams - Recommend daily broad spectrum sunscreen SPF 30+ to sun-exposed areas, reapply every 2 hours as needed.  - Call if any new or changing lesions are noted between office visits  Inflamed seborrheic keratosis Back x3  Symptomatic, irritating, patient would like treated.  Destruction of lesion - Back x3 Complexity: simple   Destruction method: cryotherapy   Informed consent: discussed and consent obtained   Timeout:  patient name, date of birth, surgical site, and procedure verified Lesion destroyed using liquid nitrogen: Yes   Region frozen until ice ball extended beyond lesion: Yes    Outcome: patient tolerated procedure well with no complications   Post-procedure details: wound care instructions given   Additional details:  Prior to procedure, discussed risks of blister formation, small wound, skin dyspigmentation, or rare scar following cryotherapy. Recommend Vaseline ointment to treated areas while healing.   Inflamed acrochordon Right Anterior Neck  Destruction of lesion - Right Anterior Neck Complexity: simple   Destruction method comment:  Snip removal Informed consent: discussed and consent obtained   Timeout:  patient name, date of birth, surgical site, and procedure verified Anesthesia: the lesion was anesthetized in a standard fashion   Anesthetic:  1% lidocaine w/ epinephrine 1-100,000 local infiltration Hemostasis achieved with:  aluminum chloride and electrodesiccation Outcome: patient tolerated procedure well with no complications   Post-procedure details: sterile dressing applied and wound care instructions given   Dressing type: petrolatum and bandage     Skin cancer screening performed today.  Actinic Damage - Chronic condition, secondary to cumulative UV/sun exposure - diffuse scaly erythematous macules with underlying dyspigmentation - Recommend daily broad spectrum sunscreen SPF 30+ to sun-exposed areas, reapply every 2 hours as needed.  - Staying in the shade or wearing long sleeves, sun glasses (UVA+UVB protection) and wide brim hats (4-inch brim around the entire circumference of the hat) are also recommended for sun protection.  - Call for new or changing lesions.  Lentigines, Seborrheic Keratoses, Hemangiomas - Benign normal skin lesions - Benign-appearing - Call for any changes  Melanocytic Nevi - Tan-brown and/or pink-flesh-colored symmetric macules and papules - Benign appearing on exam today - Observation -  Call clinic for new or changing moles - Recommend daily use of broad spectrum spf 30+ sunscreen to sun-exposed areas.    Purpura - Chronic; persistent and recurrent.  Treatable, but not curable. - Violaceous macules and patches - Benign - Related to trauma, age, sun damage and/or use of blood thinners, chronic use of topical and/or oral steroids - Observe - Can use OTC arnica containing moisturizer such as Dermend Bruise Formula if desired - Call for worsening or other concerns   Return in about 1 year (around 09/10/2024) for UBSE, HxBCCs.  I, Lawson Radar, CMA, am acting as scribe for Armida Sans, MD.   Documentation: I have reviewed the above documentation for accuracy and completeness, and I agree with the above.  Armida Sans, MD

## 2023-09-11 NOTE — Progress Notes (Deleted)
   Follow-Up Visit   Subjective  Dylan [name] is a 89 y.o. male who presents for the following: Skin Cancer Screening and Full Body Skin Exam  The patient presents for Total-Body Skin Exam (TBSE) for skin cancer screening and mole check. The patient has spots, moles and lesions to be evaluated, some may be new or changing and the patient may have concern these could be cancer.    The following portions of the chart were reviewed this encounter and updated as appropriate: medications, allergies, medical history  Review of Systems:  No other skin or systemic complaints except as noted in HPI or Assessment and Plan.  Objective  Well appearing patient in no apparent distress; mood and affect are within normal limits.  A full examination was performed including scalp, head, eyes, ears, nose, lips, neck, chest, axillae, abdomen, back, buttocks, bilateral upper extremities, bilateral lower extremities, hands, feet, fingers, toes, fingernails, and toenails. All findings within normal limits unless otherwise noted below.   Relevant physical exam findings are noted in the Assessment and Plan.    Assessment & Plan   HISTORY OF BASAL CELL CARCINOMA OF THE SKIN. Mid forehead ED&C 2021 Right nasal sidewall 12/2014 Mohs - No evidence of recurrence today - Recommend regular full body skin exams - Recommend daily broad spectrum sunscreen SPF 30+ to sun-exposed areas, reapply every 2 hours as needed.  - Call if any new or changing lesions are noted between office visits   SKIN CANCER SCREENING PERFORMED TODAY.  ACTINIC DAMAGE - Chronic condition, secondary to cumulative UV/sun exposure - diffuse scaly erythematous macules with underlying dyspigmentation - Recommend daily broad spectrum sunscreen SPF 30+ to sun-exposed areas, reapply every 2 hours as needed.  - Staying in the shade or wearing long sleeves, sun glasses (UVA+UVB protection) and wide brim hats (4-inch brim around the entire  circumference of the hat) are also recommended for sun protection.  - Call for new or changing lesions.  LENTIGINES, SEBORRHEIC KERATOSES, HEMANGIOMAS - Benign normal skin lesions - Benign-appearing - Call for any changes  MELANOCYTIC NEVI - Tan-brown and/or pink-flesh-colored symmetric macules and papules - Benign appearing on exam today - Observation - Call clinic for new or changing moles - Recommend daily use of broad spectrum spf 30+ sunscreen to sun-exposed areas.        No follow-ups on file.  I, Lawson Radar, CMA, am acting as scribe for Armida Sans, MD.   Documentation: I have reviewed the above documentation for accuracy and completeness, and I agree with the above.  Armida Sans, MD

## 2023-09-14 NOTE — Progress Notes (Unsigned)
Cardiology Office Note Date:  09/14/2023  Patient ID:  Dylan [name], Dylan [name] [birth year], MRN [id number] PCP:  Eustaquio Boyden, MD  Cardiologist:  Dr. Dulce Sellar EP: Dr. Elberta Fortis   Chief Complaint:  *** 6 month  History of Present Illness: Dylan [name] is a 89 y.o. male with history of HLD, dementia, CKD (III), RBBB and syncope > ILR implant  He was admitted to [facility] 05/31/20 with acute onset of worsening generalized weakness with associated mild dyspnea. In the ER, VSS WBC 23.1, lactic acid 3.9, mild bump in Creat 1.59 (baselin 1.18) Started on IFV and antibiotics Suspect pneumonia Discharged on PO antibiotics and started on midodrine as well Discharged 06/02/20.  Hospitalized 03/16/22 - 03/18/22 with c/o weakness, N/V/D, fever, found w/COVID  Hospitalized 04/16/22 - 04/23/22 after a syncopal event (x2), suspect orthostatic as major contributor, cardiology participated in his care, loop was interrogated with no HR/rhythm findings to explain syncope. He was treated with fluids, and midodrine increased. His Eliquis stopped with concerns of recurrent falls/syncope, traumatic bleeding, this to be re-addressed by primary cardiology team at follow up  He was reported to have HRs to 30's by pulse ox,BP cuff, felt to be 2/2 PVCs   Hospitalized 05/16/22 - 05/17/22 with observed bradycardia and low BPs. ? No symptoms.  Observed to have PVCs and this felt to be the cause of observed brady rates at home, florinef added to his regime. Cardiology consulted, no particular recommendations, agreed with florinef.  Has been following in our office all along seeing myself and Dr. Elberta Fortis  I saw him 07/18/22, loop RRT No brady or pauses He is accompanied by his wife today Since being started on Seroquel and increased to full tab daily, his BP has imroved quite a bit. This particular regime seems to be doing pretty well His dementia not particularly improved by it but BP has steadied out. His wife gets HRs at times in  the 30's but recalled that the extra beats can make that number low, usually a recheck after meds and or so is better. He had not had any lethargy, unusual AMS, he has not had any falls or faints since our last visit. With the Seroquel and improved BP he has not had to wear the abdominal binder. She has an alarm o his bed now and their home regime is getting smoother. He goes to adult day care 3d/week, gives her a couple hours these day to get stuff done. We discussed importance of her taking care of herself  as well. He denies any symptoms She concurs, no noted cardiac issues/concerns No SOB Patient's wife remained most comfortable OFF/without a/c Orthostatic BP felt to be the bigger problem then his PVCs No changes were made  Seeing Dr. Elberta Fortis a few times since then   Started on amiodarone for his PVCs Last was 02/20/23, doing better, no PVCs on EKG/rhythm strip, planned for surveillance labs  ER visit 04/19/23 for syncope, occurred in the shower, SBP 90's, given his home midodrine and IVF Vitals mention HR 40's EKG was SB 45bpm, (no PVCs), RBBB In d/w cards, syncope more likely his BP then bradycardiaw/recs to increase midodrine if need  Seeing Dr. Allyson Sabal for his PAD, mostly sedentary, RLE pain of his entire leg, not c/w claudication, vascular cause.  Not planned for intervention   *** reduce amio...  ?? On any nodal blocker? *** any more syncope? *** HR?  Device information MDT LNQII  Implanted 09/30/2018 for syncope EOS  Past  Medical History:  Diagnosis Date   Basal cell carcinoma 08/31/2020   Mid forehead - ED&C    Basal cell carcinoma of right nasal sidewall 12/15/2014   Bilateral hearing loss 08/03/2018   S/p audiology evaluation, has new hearing aides.   Bradycardia    a. 08/2018 ETT: Poor ex tolerance (2:10); b. 09/2018 s/p MDT Linq.   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 09/2018 s/p MDT Linq; b. 10/2021 Echo: EF 55-60%, no rwma, nl RV fxn, mild MR, triv AI.   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 20 tablet 0   aspirin EC 81 MG tablet Take 1 tablet (81 mg total) by mouth daily. Swallow whole.     atorvastatin (LIPITOR) 20 MG tablet Take 1 tablet (20 mg total) by mouth daily. 90 tablet 1   Cholecalciferol (VITAMIN D3) 1000 units CAPS Take 1 capsule (1,000 Units total) by mouth daily. 30 capsule    DULoxetine (CYMBALTA) 20 MG capsule Take 2 capsules by mouth once daily 60 capsule 3   fexofenadine (ALLEGRA ALLERGY) 180 MG tablet Take 1 tablet (180 mg total) by mouth daily.     finasteride (PROSCAR) 5 MG tablet Take 1 tablet by mouth once daily 90 tablet 3   fludrocortisone (FLORINEF) 0.1 MG tablet Take 1 tablet by mouth once  daily 90 tablet 2   guaiFENesin (MUCINEX) 600 MG 12 hr tablet Take 1,200 mg by mouth daily.     midodrine (PROAMATINE) 5 MG tablet TAKE 1 TABLET BY MOUTH THREE TIMES DAILY WITH MEALS 90 tablet 3   Polyethylene Glycol 3350 (MIRALAX PO) Take 17 g by mouth daily.     Probiotic Product (ALIGN PO) Take by mouth daily.     Sennosides (SENOKOT PO) Take 1 tablet by mouth at bedtime.     vitamin B-12 (V-R VITAMIN B-12) 500 MCG tablet Take 1 tablet (500 mcg total) by mouth daily. (Patient taking differently: Take 500 mcg by mouth at bedtime.)     No current facility-administered medications for this visit.    Allergies:   Doxepin, Seroquel [quetiapine], and Lincomycin hcl   Social History:  The patient  reports that he quit smoking about 62 years ago. His smoking use included cigarettes. He has never used smokeless tobacco. He reports that he does not drink alcohol and does not use drugs.   Family History:  The patient's family history includes Cataracts in his sister; Congestive Heart Failure in his father and mother;  Leukemia in his brother; Stroke (age of onset: 74) in his father.  ROS:  Please see the history of present illness. All other systems are reviewed and otherwise negative.   PHYSICAL EXAM:  VS:  There were no vitals taken for this visit. BMI: There is no height or weight on file to calculate BMI. Well nourished, well developed, in no acute distress  HEENT: normocephalic, atraumatic  Neck: no JVD, carotid bruits or masses Cardiac: *** RRR, a few extrasystoles with an prolonged ausculation, no significant murmurs, no rubs, or gallops Lungs:  *** CTA b/l, no wheezing, rhonchi or rales  Abd: soft, nontender MS: no deformity, age appropriate, perhaps advanced atrophy Ext: *** no edema  Skin: warm and dry, no rash Neuro:  known advanced dementia,  No gross focal motor deficits appreciated Psych: euthymic mood, full affect  ILR site *** is stable, no tethering or discomfort   EKG:  not done today   11/08/2021: TTE  1. Left ventricular ejection fraction, by estimation, is 55 to 60%. Left  ventricular ejection fraction by 2D MOD biplane is 59.7 %. The left  ventricle has normal function. The left ventricle has no regional wall  motion abnormalities. Left ventricular  diastolic parameters were normal.   2. Right ventricular systolic function is normal. The right ventricular  size is normal.   3. The mitral valve is normal in structure. Mild mitral valve  regurgitation.   4. The aortic valve was not well visualized. Aortic valve regurgitation  is trivial.   5. The inferior vena cava is normal in size with greater than 50%  respiratory variability, suggesting right atrial pressure of 3 mmHg.   09/04/2018: ETT Blood pressure demonstrated a normal response to exercise. Clinically and electrically negative for ischemia Extremelly poor exercise tolerance Dr. Elberta Fortis commented:  Poor exercise tolerance.  No evidence of chronotropic incompetence.  Will need Linq monitoring for episode of  syncope.   14 day monitor Aug 2019  Indication, syncope   Baseline rhythm, sinus minimum maximum and average rate 44 136 and 64 bpm   Supraventricular ectopy, less than 1% there were frequent 297 runs of APCs longest lasting 11.2 seconds with a rate of 122 bpm the fastest 5 complexes a rate of 169 bpm.  There are no episodes of atrial fibrillation or flutter.   Ventricular ectopy,  rare isolated APCs 5 couplets and one triplet less than 1% burden. There were no episodes of sinus node or AV nodal block There are no symptomatic events Conclusion brief runs of atrial premature contractions   Recent Labs: 09/30/2022: B Natriuretic Peptide 471.9 02/20/2023: TSH 1.900 04/19/2023: ALT 24 09/01/2023: BUN 15; Creatinine, Ser 1.31; Hemoglobin 15.4; Platelets 112; Potassium 3.2; Sodium 139  No results found for requested labs within last 365 days.   Estimated Creatinine Clearance: 43.6 mL/min (A) (by C-G formula based on SCr of 1.31 mg/dL (H)).   Wt Readings from Last 3 Encounters:  09/03/23 175 lb 6 oz (79.5 kg)  09/01/23 172 lb (78 kg)  08/19/23 176 lb (79.8 kg)     Other studies reviewed: Additional studies/records reviewed today include: summarized above  ASSESSMENT AND PLAN:  1. Syncope     Baseline conduction system disease, RBBB, LAD     Device is RRT      By prior device interrogations,  No true brady or pause episodes     Orthostatic hypotension has been demonstrated, now on midodrine and florinef     The addition of Seroquel to his regime has added his BP has had further improvement  Re-discussed treatment strategies at length His wife is doing a great job Hard with his advancing dementia. Will continue to try and encourage hydration   We have previously discussed dilemma between treating PVC and his low BP's/orthostatic hypotension I think the orthostatic issue is the bigger problem *** Try to hold off on meds for the PVCs    2. Aflutter CHA2DS2Vasc is 2 We have  previously discussed stroke risk with Afib and fall/traumatic bleeding risk Revisited this today, *** His wife remains most comfortable as well staying off eliquis for now    Disposition: *** sooner if needed     Current medicines are reviewed at length with the patient today.  The patient did not have any concerns regarding medicines.  Norma Fredrickson, PA-C 09/14/2023 3:19 PM     [facility] [address] [place] [address] [phone] (office)  [phone] (fax)

## 2023-09-17 ENCOUNTER — Ambulatory Visit: Payer: Medicare Other | Attending: Physician Assistant | Admitting: Physician Assistant

## 2023-09-17 ENCOUNTER — Ambulatory Visit (INDEPENDENT_AMBULATORY_CARE_PROVIDER_SITE_OTHER): Payer: Medicare Other

## 2023-09-17 ENCOUNTER — Encounter: Payer: Self-pay | Admitting: Physician Assistant

## 2023-09-17 VITALS — BP 148/68 | HR 54 | Ht 73.0 in | Wt 178.4 lb

## 2023-09-17 DIAGNOSIS — R001 Bradycardia, unspecified: Secondary | ICD-10-CM

## 2023-09-17 DIAGNOSIS — R55 Syncope and collapse: Secondary | ICD-10-CM | POA: Diagnosis not present

## 2023-09-17 DIAGNOSIS — I493 Ventricular premature depolarization: Secondary | ICD-10-CM

## 2023-09-17 DIAGNOSIS — I4510 Unspecified right bundle-branch block: Secondary | ICD-10-CM

## 2023-09-17 DIAGNOSIS — I4892 Unspecified atrial flutter: Secondary | ICD-10-CM

## 2023-09-17 MED ORDER — FLUDROCORTISONE ACETATE 0.1 MG PO TABS: 0.2000 mg | ORAL_TABLET | Freq: Every day | ORAL | 3 refills | Status: DC

## 2023-09-17 NOTE — Patient Instructions (Addendum)
Medication Instructions:  Florinef (Fludrocortisone) 0.2 mg once daily  *If you need a refill on your cardiac medications before your next appointment, please call your pharmacy*  Lab BMET today  Testing/Procedures: Zio Cardiac Monitor for 14 days - will come to you in the mail Your physician has recommended that you wear an event monitor. Event monitors are medical devices that record the heart's electrical activity. Doctors most often [place] these monitors to diagnose arrhythmias. Arrhythmias are problems with the speed or rhythm of the heartbeat. The monitor is a small, portable device. You can wear one while you do your normal daily activities. This is usually used to diagnose what is causing palpitations/syncope (passing out).   Follow-Up: At [facility], you and your health needs are our priority.  As part of our continuing mission to provide you with exceptional heart care, we have created designated Provider Care Teams.  These Care Teams include your primary Cardiologist (physician) and Advanced Practice Providers (APPs -  Physician Assistants and Nurse Practitioners) who all work together to provide you with the care you need, when you need it.  We recommend signing up for the patient portal called "MyChart".  Sign up information is provided on this After Visit Summary.  MyChart is used to connect with patients for Virtual Visits (Telemedicine).  Patients are able to view lab/test results, encounter notes, upcoming appointments, etc.  Non-urgent messages can be sent to your provider as well.   To learn more about what you can do with MyChart, go to [email or web address].    Your next appointment:   1 month(s)  Provider:   Loman Brooklyn, MD   OTHER INCREASE ORAL HYDRATION!!  ZIO XT- Long Term Monitor Instructions  Your physician has requested you wear a ZIO patch monitor for 14 days.  This is a single patch monitor. [employer] supplies one patch monitor per enrollment.  Additional stickers are not available. Please do not apply patch if you will be having a Nuclear Stress Test,  Echocardiogram, Cardiac CT, MRI, or Chest Xray during the period you would be wearing the  monitor. The patch cannot be worn during these tests. You cannot remove and re-apply the  ZIO XT patch monitor.  Your ZIO patch monitor will be mailed 3 day USPS to your address on file. It may take 3-5 days  to receive your monitor after you have been enrolled.  Once you have received your monitor, please review the enclosed instructions. Your monitor  has already been registered assigning a specific monitor serial # to you.  Billing and Patient Assistance Program Information  We have supplied [employer] with any of your insurance information on file for billing purposes. [employer] offers a sliding scale Patient Assistance Program for patients that do not have  insurance, or whose insurance does not completely cover the cost of the ZIO monitor.  You must apply for the Patient Assistance Program to qualify for this discounted rate.  To apply, please call [employer] at [phone], select option 4, select option 2, ask to apply for  Patient Assistance Program. Meredeth Ide will ask your household income, and how many people  are in your household. They will quote your out-of-pocket cost based on that information.  [employer] will also be able to set up a 16-month, interest-free payment plan if needed.  Applying the monitor   Shave hair from upper left chest.  Hold abrader disc by orange tab. Rub abrader in 40 strokes over the upper left chest as  indicated in  your monitor instructions.  Clean area with 4 enclosed alcohol pads. Let dry.  Apply patch as indicated in monitor instructions. Patch will be placed under collarbone on left  side of chest with arrow pointing upward.  Rub patch adhesive wings for 2 minutes. Remove white label marked "1". Remove the white  label marked "2". Rub patch adhesive wings  for 2 additional minutes.  While looking in a mirror, press and release button in center of patch. A small green light will  flash 3-4 times. This will be your only indicator that the monitor has been turned on.  Do not shower for the first 24 hours. You may shower after the first 24 hours.  Press the button if you feel a symptom. You will hear a small click. Record Date, Time and  Symptom in the Patient Logbook.  When you are ready to remove the patch, follow instructions on the last 2 pages of Patient  Logbook. Stick patch monitor onto the last page of Patient Logbook.  Place Patient Logbook in the blue and white box. Use locking tab on box and tape box closed  securely. The blue and white box has prepaid postage on it. Please place it in the mailbox as  soon as possible. Your physician should have your test results approximately 7 days after the  monitor has been mailed back to [facility].  Call [facility] Customer Care at [phone] if you have questions regarding  your ZIO XT patch monitor. Call them immediately if you see an orange light blinking on your  monitor.  If your monitor falls off in less than 4 days, contact our Monitor department at [phone].  If your monitor becomes loose or falls off after 4 days call [employer] at [phone] for  suggestions on securing your monitor

## 2023-09-17 NOTE — Progress Notes (Unsigned)
Enrolled patient for a 14 day Zio XT monitor to be mailed to patients home  ° °[place] to read °

## 2023-09-18 ENCOUNTER — Encounter: Payer: Self-pay | Admitting: Family Medicine

## 2023-09-18 DIAGNOSIS — R001 Bradycardia, unspecified: Secondary | ICD-10-CM | POA: Diagnosis not present

## 2023-09-18 DIAGNOSIS — G8929 Other chronic pain: Secondary | ICD-10-CM

## 2023-09-18 DIAGNOSIS — F66 Other sexual disorders: Secondary | ICD-10-CM

## 2023-09-19 DIAGNOSIS — R001 Bradycardia, unspecified: Secondary | ICD-10-CM

## 2023-09-19 LAB — BASIC METABOLIC PANEL
BUN/Creatinine Ratio: 11 (ref 10–24)
BUN: 16 mg/dL (ref 8–27)
CO2: 26 mmol/L (ref 20–29)
Calcium: 9.6 mg/dL (ref 8.6–10.2)
Chloride: 103 mmol/L (ref 96–106)
Creatinine, Ser: 1.42 mg/dL — ABNORMAL HIGH (ref 0.76–1.27)
Glucose: 70 mg/dL (ref 70–99)
Potassium: 4.4 mmol/L (ref 3.5–5.2)
Sodium: 144 mmol/L (ref 134–144)
eGFR: 48 mL/min/{1.73_m2} — ABNORMAL LOW (ref >59)

## 2023-09-20 ENCOUNTER — Other Ambulatory Visit: Payer: Self-pay | Admitting: Family Medicine

## 2023-09-20 DIAGNOSIS — N1831 Chronic kidney disease, stage 3a: Secondary | ICD-10-CM

## 2023-09-20 DIAGNOSIS — D696 Thrombocytopenia, unspecified: Secondary | ICD-10-CM

## 2023-09-20 DIAGNOSIS — E559 Vitamin D deficiency, unspecified: Secondary | ICD-10-CM

## 2023-09-20 DIAGNOSIS — E785 Hyperlipidemia, unspecified: Secondary | ICD-10-CM

## 2023-09-20 DIAGNOSIS — E538 Deficiency of other specified B group vitamins: Secondary | ICD-10-CM

## 2023-09-23 ENCOUNTER — Other Ambulatory Visit (INDEPENDENT_AMBULATORY_CARE_PROVIDER_SITE_OTHER): Payer: Medicare Other

## 2023-09-23 DIAGNOSIS — E559 Vitamin D deficiency, unspecified: Secondary | ICD-10-CM | POA: Diagnosis not present

## 2023-09-23 DIAGNOSIS — E538 Deficiency of other specified B group vitamins: Secondary | ICD-10-CM | POA: Diagnosis not present

## 2023-09-23 DIAGNOSIS — N1831 Chronic kidney disease, stage 3a: Secondary | ICD-10-CM

## 2023-09-23 DIAGNOSIS — E785 Hyperlipidemia, unspecified: Secondary | ICD-10-CM | POA: Diagnosis not present

## 2023-09-23 DIAGNOSIS — D696 Thrombocytopenia, unspecified: Secondary | ICD-10-CM | POA: Diagnosis not present

## 2023-09-23 LAB — LIPID PANEL
Cholesterol: 131 mg/dL (ref 0–200)
HDL: 46 mg/dL (ref >39.00)
LDL Cholesterol: 66 mg/dL (ref 0–99)
NonHDL: 85.11
Total CHOL/HDL Ratio: 3
Triglycerides: 96 mg/dL (ref 0.0–149.0)
VLDL: 19.2 mg/dL (ref 0.0–40.0)

## 2023-09-23 LAB — COMPREHENSIVE METABOLIC PANEL
ALT: 22 U/L (ref 0–53)
AST: 21 U/L (ref 0–37)
Albumin: 4.1 g/dL (ref 3.5–5.2)
Alkaline Phosphatase: 86 U/L (ref 39–117)
BUN: 15 mg/dL (ref 6–23)
CO2: 32 meq/L (ref 19–32)
Calcium: 8.9 mg/dL (ref 8.4–10.5)
Chloride: 103 meq/L (ref 96–112)
Creatinine, Ser: 1.32 mg/dL (ref 0.40–1.50)
GFR: 48.46 mL/min — ABNORMAL LOW (ref >60.00)
Glucose, Bld: 78 mg/dL (ref 70–99)
Potassium: 3.7 meq/L (ref 3.5–5.1)
Sodium: 143 meq/L (ref 135–145)
Total Bilirubin: 0.7 mg/dL (ref 0.2–1.2)
Total Protein: 6.3 g/dL (ref 6.0–8.3)

## 2023-09-23 LAB — VITAMIN B12: Vitamin B-12: 786 pg/mL (ref 211–911)

## 2023-09-23 LAB — VITAMIN D 25 HYDROXY (VIT D DEFICIENCY, FRACTURES): VITD: 68.1 ng/mL (ref 30.00–100.00)

## 2023-09-23 LAB — PHOSPHORUS: Phosphorus: 3.1 mg/dL (ref 2.3–4.6)

## 2023-09-23 NOTE — Addendum Note (Signed)
Addended by: Vincenza Hews on: 09/23/2023 02:31 PM   Modules accepted: Orders

## 2023-09-24 LAB — CBC WITH DIFFERENTIAL/PLATELET
Basophils Absolute: 0.1 10*3/uL (ref 0.0–0.1)
Basophils Relative: 1 % (ref 0.0–3.0)
Eosinophils Absolute: 0.1 10*3/uL (ref 0.0–0.7)
Eosinophils Relative: 1.4 % (ref 0.0–5.0)
HCT: 45.9 % (ref 39.0–52.0)
Hemoglobin: 15.7 g/dL (ref 13.0–17.0)
Lymphocytes Relative: 16.6 % (ref 12.0–46.0)
Lymphs Abs: 1 10*3/uL (ref 0.7–4.0)
MCHC: 34.1 g/dL (ref 30.0–36.0)
MCV: 97.7 fL (ref 78.0–100.0)
Monocytes Absolute: 0.7 10*3/uL (ref 0.1–1.0)
Monocytes Relative: 10.8 % (ref 3.0–12.0)
Neutro Abs: 4.3 10*3/uL (ref 1.4–7.7)
Neutrophils Relative %: 70.2 % (ref 43.0–77.0)
Platelets: 118 10*3/uL — ABNORMAL LOW (ref 150.0–400.0)
RBC: 4.7 Mil/uL (ref 4.22–5.81)
RDW: 14.3 % (ref 11.5–15.5)
WBC: 6.1 10*3/uL (ref 4.0–10.5)

## 2023-09-24 LAB — PARATHYROID HORMONE, INTACT (NO CA): PTH: 61 pg/mL (ref 16–77)

## 2023-09-25 MED ORDER — FLUOXETINE HCL 20 MG PO CAPS: ORAL_CAPSULE | ORAL | 6 refills | Status: DC

## 2023-09-29 ENCOUNTER — Other Ambulatory Visit (INDEPENDENT_AMBULATORY_CARE_PROVIDER_SITE_OTHER): Payer: Medicare Other | Admitting: Radiology

## 2023-09-29 DIAGNOSIS — N1831 Chronic kidney disease, stage 3a: Secondary | ICD-10-CM | POA: Diagnosis not present

## 2023-09-29 LAB — MICROALBUMIN / CREATININE URINE RATIO
Creatinine,U: 114 mg/dL
Microalb Creat Ratio: 4 mg/g (ref 0.0–30.0)
Microalb, Ur: 4.6 mg/dL — ABNORMAL HIGH (ref 0.0–1.9)

## 2023-09-30 ENCOUNTER — Encounter: Payer: Self-pay | Admitting: Family Medicine

## 2023-09-30 ENCOUNTER — Ambulatory Visit: Payer: Medicare Other | Admitting: Family Medicine

## 2023-09-30 VITALS — BP 128/78 | HR 55 | Temp 98.1°F | Ht 73.0 in | Wt 177.0 lb

## 2023-09-30 DIAGNOSIS — Z0000 Encounter for general adult medical examination without abnormal findings: Secondary | ICD-10-CM | POA: Diagnosis not present

## 2023-09-30 DIAGNOSIS — E785 Hyperlipidemia, unspecified: Secondary | ICD-10-CM

## 2023-09-30 DIAGNOSIS — D696 Thrombocytopenia, unspecified: Secondary | ICD-10-CM | POA: Diagnosis not present

## 2023-09-30 DIAGNOSIS — K5900 Constipation, unspecified: Secondary | ICD-10-CM

## 2023-09-30 DIAGNOSIS — N1831 Chronic kidney disease, stage 3a: Secondary | ICD-10-CM

## 2023-09-30 DIAGNOSIS — E538 Deficiency of other specified B group vitamins: Secondary | ICD-10-CM

## 2023-09-30 DIAGNOSIS — G301 Alzheimer's disease with late onset: Secondary | ICD-10-CM

## 2023-09-30 DIAGNOSIS — I4892 Unspecified atrial flutter: Secondary | ICD-10-CM

## 2023-09-30 DIAGNOSIS — I951 Orthostatic hypotension: Secondary | ICD-10-CM

## 2023-09-30 DIAGNOSIS — F02C18 Dementia in other diseases classified elsewhere, severe, with other behavioral disturbance: Secondary | ICD-10-CM

## 2023-09-30 DIAGNOSIS — N400 Enlarged prostate without lower urinary tract symptoms: Secondary | ICD-10-CM

## 2023-09-30 DIAGNOSIS — Z0001 Encounter for general adult medical examination with abnormal findings: Secondary | ICD-10-CM

## 2023-09-30 DIAGNOSIS — Z66 Do not resuscitate: Secondary | ICD-10-CM

## 2023-09-30 DIAGNOSIS — R55 Syncope and collapse: Secondary | ICD-10-CM

## 2023-09-30 DIAGNOSIS — E559 Vitamin D deficiency, unspecified: Secondary | ICD-10-CM | POA: Diagnosis not present

## 2023-09-30 DIAGNOSIS — F66 Other sexual disorders: Secondary | ICD-10-CM

## 2023-09-30 MED ORDER — ATORVASTATIN CALCIUM 20 MG PO TABS: 20.0000 mg | ORAL_TABLET | Freq: Every day | ORAL | 4 refills | Status: DC

## 2023-09-30 MED ORDER — FINASTERIDE 5 MG PO TABS: 5.0000 mg | ORAL_TABLET | Freq: Every day | ORAL | 4 refills | Status: DC

## 2023-09-30 MED ORDER — MIDODRINE HCL 5 MG PO TABS: 5.0000 mg | ORAL_TABLET | Freq: Three times a day (TID) | ORAL | 6 refills | Status: DC

## 2023-09-30 NOTE — Assessment & Plan Note (Signed)
Preventative protocols reviewed and updated unless pt declined. Discussed healthy diet and lifestyle.  

## 2023-09-30 NOTE — Assessment & Plan Note (Signed)
Continue daily bowel regimen with miralax and senakot.

## 2023-09-30 NOTE — Assessment & Plan Note (Addendum)
Chronic, stable. He continues aspirin 81mg  daily.

## 2023-09-30 NOTE — Assessment & Plan Note (Addendum)
Now off amiodarone, undergoing cross taper off Cymbalta onto Prozac which was more effective

## 2023-09-30 NOTE — Assessment & Plan Note (Signed)
This may be better off amiodarone

## 2023-09-30 NOTE — Assessment & Plan Note (Signed)
Chronic, improved period on atorvastatin 20mg  daily - continue. The ASCVD Risk score (Arnett DK, et al., 2019) failed to calculate for the following reasons:   The 2019 ASCVD risk score is only valid for ages 53 to 12

## 2023-09-30 NOTE — Patient Instructions (Signed)
Good to see you today Continue current medicines Return as needed or in 3-4 months for follow up visit

## 2023-09-30 NOTE — Assessment & Plan Note (Signed)
Continues fludrocortisone 0.2mg  daily along with midodrine 5mg  TID with meals.

## 2023-09-30 NOTE — Assessment & Plan Note (Signed)
bvFTD with Alz component. Wife cares for him.  Continues at [facility] adult memory daycare.

## 2023-09-30 NOTE — Assessment & Plan Note (Signed)
Continue proscar 5 mg daily

## 2023-09-30 NOTE — Progress Notes (Signed)
Ph: [phone] Fax: [phone]   Patient ID: Dylan [name], male    DOB: May 19, 1934, 89 y.o.   MRN: [id number]  This visit was conducted in person.  BP 128/78   Pulse (!) 55   Temp 98.1 F (36.7 C) (Temporal)   Ht 6\' 1"  (1.854 m)   Wt 177 lb (80.3 kg)   SpO2 97%   BMI 23.35 kg/m    CC: CPE Subjective:   HPI: Dylan [name] is a 89 y.o. male presenting on 09/30/2023 for Medicare Wellness (Pt accompanied by wife, [name]. )   Saw EP - amiodarone discontinued, florinef increased to 0.2mg  daily, midodrine 5mg  continued TID with meals. Pulse has improved off amiodarone. We subsequently changed Cymbalta to Prozac for sexual disinhibition - as it was more effective. Just started cross-taper yesterday. Atrial flutter - avoiding AC due to fall risk. Planning cardiac event monitor.   New ticks - sucking on fingers and thumbs.   Preventative: Colon cancer screening - aged out. Denies blood in stool. Prostate cancer screening - aged out.  Lung cancer screening - not eligible Flu shot - yearly COVID vaccine - Pfizer 11/2019, 12/2019, booster 08/2020, bivalent 08/2021 Td 2006, 2011  Pneumovax-23 08/2020, prevnar-20 08/2022 RSV - 09/2022 Shingrix - considering  Advanced directive - scanned into chart 12/2019. Wife [name] then Fayrene Fearing are HCPOA. Does not want life prolonging measures if terminal condition. Confirmed DNR.  Bleeding mole to posterior leg. Sees dermatologist Dr Gwen Pounds.  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 Interim medical history since our last visit reviewed. Allergies and medications reviewed and updated. Outpatient Medications Prior to Visit  Medication Sig Dispense Refill   acetaminophen (TYLENOL) 325 MG tablet Take 2 tablets (650 mg total) by mouth every 6 (six) hours as needed for mild pain (or Fever >/= 101). 20 tablet 0   aspirin EC 81 MG tablet Take 1 tablet (81 mg total) by mouth daily. Swallow whole.     Cholecalciferol (VITAMIN D3) 1000 units CAPS Take 1 capsule (1,000 Units total) by mouth daily. 30 capsule    fexofenadine (ALLEGRA ALLERGY) 180 MG tablet Take 1 tablet (180 mg total) by mouth daily.     fludrocortisone (FLORINEF) 0.1 MG tablet Take 2 tablets (0.2 mg total) by mouth daily. 180 tablet 3   FLUoxetine (PROZAC) 20 MG capsule Take 1 capsule (20 mg total) by mouth daily for 7 days, THEN 2 capsules (40 mg total) daily. 60 capsule 6   guaiFENesin (MUCINEX) 600 MG 12 hr tablet Take 1,200 mg by mouth daily.     Polyethylene Glycol 3350 (MIRALAX PO) Take 17 g by mouth daily.     Probiotic Product (ALIGN PO) Take by mouth daily.     Sennosides (SENOKOT PO) Take 1 tablet by mouth at bedtime.     vitamin B-12 (V-R VITAMIN B-12) 500 MCG tablet Take 1 tablet (500 mcg total) by mouth daily. (Patient taking differently: Take 500 mcg by mouth at bedtime.)  atorvastatin (LIPITOR) 20 MG tablet Take 1 tablet (20 mg total) by mouth daily. 90 tablet 1   DULoxetine (CYMBALTA) 20 MG capsule Take 1 capsule (20 mg total) by mouth daily.     finasteride (PROSCAR) 5 MG tablet Take 1 tablet by mouth once daily 90 tablet 3   midodrine (PROAMATINE) 5 MG tablet TAKE 1 TABLET BY MOUTH THREE TIMES DAILY WITH MEALS 90 tablet 3   No facility-administered medications prior to visit.     Per HPI unless specifically indicated in ROS section below Review of Systems  Constitutional:  Negative for activity change, appetite change, chills, fatigue, fever and unexpected weight change.   HENT:  Negative for hearing loss.   Eyes:  Negative for visual disturbance.  Respiratory:  Positive for cough (mild). Negative for chest tightness, shortness of breath and wheezing.   Cardiovascular:  Positive for palpitations. Negative for chest pain and leg swelling.  Gastrointestinal:  Negative for abdominal distention, abdominal pain, blood in stool, constipation, diarrhea, nausea and vomiting.  Genitourinary:  Negative for difficulty urinating and hematuria.  Musculoskeletal:  Negative for arthralgias, myalgias and neck pain.  Skin:  Negative for rash.  Neurological:  Positive for dizziness and seizures. Negative for syncope and headaches.  Hematological:  Negative for adenopathy. Does not bruise/bleed easily.  Psychiatric/Behavioral:  Negative for dysphoric mood. The patient is not nervous/anxious.     Objective:  BP 128/78   Pulse (!) 55   Temp 98.1 F (36.7 C) (Temporal)   Ht 6\' 1"  (1.854 m)   Wt 177 lb (80.3 kg)   SpO2 97%   BMI 23.35 kg/m   Wt Readings from Last 3 Encounters:  09/30/23 177 lb (80.3 kg)  09/17/23 178 lb 6.4 oz (80.9 kg)  09/03/23 175 lb 6 oz (79.5 kg)      Physical Exam Vitals and nursing note reviewed.  Constitutional:      General: He is not in acute distress.    Appearance: Normal appearance. He is well-developed. He is not ill-appearing.  HENT:     Head: Normocephalic and atraumatic.     Right Ear: Hearing and external ear normal.     Left Ear: Hearing and external ear normal.     Ears:     Comments: Wearing hearing aides    Mouth/Throat:     Mouth: Mucous membranes are moist.     Pharynx: Oropharynx is clear. No oropharyngeal exudate or posterior oropharyngeal erythema.  Eyes:     General: No scleral icterus.    Extraocular Movements: Extraocular movements intact.     Conjunctiva/sclera: Conjunctivae normal.     Pupils: Pupils are equal, round, and reactive to light.  Neck:     Thyroid: No thyroid mass or thyromegaly.     Vascular: No  carotid bruit.  Cardiovascular:     Rate and Rhythm: Normal rate and regular rhythm.     Pulses: Normal pulses.          Radial pulses are 2+ on the right side and 2+ on the left side.     Heart sounds: Normal heart sounds. No murmur heard. Pulmonary:     Effort: Pulmonary effort is normal. No respiratory distress.     Breath sounds: Normal breath sounds. No wheezing, rhonchi or rales.  Abdominal:     General: Bowel sounds are normal. There is no distension.     Palpations: Abdomen is soft. There is no mass.     Tenderness: There is no abdominal tenderness.  There is no guarding or rebound.     Hernia: No hernia is present.  Musculoskeletal:        General: Normal range of motion.     Cervical back: Normal range of motion and neck supple.     Right lower leg: No edema.     Left lower leg: No edema.  Lymphadenopathy:     Cervical: No cervical adenopathy.  Skin:    General: Skin is warm and dry.     Findings: No rash.     Comments: Irritated SK to left posterior distal thigh  Neurological:     General: No focal deficit present.     Mental Status: He is alert and oriented to person, place, and time.  Psychiatric:        Mood and Affect: Mood normal.        Behavior: Behavior normal.        Thought Content: Thought content normal.        Judgment: Judgment normal.       Results for orders placed or performed in visit on 09/29/23  Microalbumin / creatinine urine ratio  Result Value Ref Range   Microalb, Ur 4.6 (H) 0.0 - 1.9 mg/dL   Creatinine,U 952.8 mg/dL   Microalb Creat Ratio 4.0 0.0 - 30.0 mg/g   Lab Results  Component Value Date   NA 143 09/23/2023   CL 103 09/23/2023   K 3.7 09/23/2023   CO2 32 09/23/2023   BUN 15 09/23/2023   CREATININE 1.32 09/23/2023   GFR 48.46 (L) 09/23/2023   CALCIUM 8.9 09/23/2023   PHOS 3.1 09/23/2023   ALBUMIN 4.1 09/23/2023   GLUCOSE 78 09/23/2023    Assessment & Plan:   Problem List Items Addressed This Visit     Encounter for  general adult medical examination with abnormal findings - Primary (Chronic)    Preventative protocols reviewed and updated unless pt declined. Discussed healthy diet and lifestyle.       DNR (do not resuscitate) (Chronic)   Dyslipidemia    Chronic, improved period on atorvastatin 20mg  daily - continue. The ASCVD Risk score (Arnett DK, et al., 2019) failed to calculate for the following reasons:   The 2019 ASCVD risk score is only valid for ages 110 to 58       Relevant Medications   atorvastatin (LIPITOR) 20 MG tablet   Thrombocytopenia (HCC)    Chronic, stable. He continues aspirin 81mg  daily.       Vitamin D deficiency    Continue vit D 1000 units daily.       Dementia (HCC)    bvFTD with Alz component. Wife cares for him.  Continues at [facility] adult memory daycare.       Recurrent syncope    This may be better off amiodarone      Vitamin B12 deficiency    Continue vit B12 daily.       CKD (chronic kidney disease) stage 3, GFR 30-59 ml/min (HCC)    Chronic, stable. Continue to monitor.       Sexual disinhibition    Now off amiodarone, undergoing cross taper off Cymbalta onto Prozac which was more effective      Constipation    Continue daily bowel regimen with miralax and senakot.       Orthostatic hypotension    Continues fludrocortisone 0.2mg  daily along with midodrine 5mg  TID with meals.       Relevant Medications   atorvastatin (LIPITOR)  20 MG tablet   midodrine (PROAMATINE) 5 MG tablet   Paroxysmal atrial flutter (HCC)   Relevant Medications   atorvastatin (LIPITOR) 20 MG tablet   midodrine (PROAMATINE) 5 MG tablet   BPH (benign prostatic hyperplasia)    Continue proscar 5mg  daily.       Relevant Medications   finasteride (PROSCAR) 5 MG tablet     Meds ordered this encounter  Medications   atorvastatin (LIPITOR) 20 MG tablet    Sig: Take 1 tablet (20 mg total) by mouth daily.    Dispense:  90 tablet    Refill:  4   finasteride  (PROSCAR) 5 MG tablet    Sig: Take 1 tablet (5 mg total) by mouth daily.    Dispense:  90 tablet    Refill:  4   midodrine (PROAMATINE) 5 MG tablet    Sig: Take 1 tablet (5 mg total) by mouth 3 (three) times daily with meals.    Dispense:  90 tablet    Refill:  6    No orders of the defined types were placed in this encounter.   Patient Instructions  Good to see you today Continue current medicines Return as needed or in 3-4 months for follow up visit   Follow up plan: Return in about 3 months (around 12/31/2023) for follow up visit.  Eustaquio Boyden, MD

## 2023-09-30 NOTE — Assessment & Plan Note (Signed)
Chronic, stable. Continue to monitor.  

## 2023-09-30 NOTE — Assessment & Plan Note (Signed)
Continue vit B12 551mcg daily.

## 2023-09-30 NOTE — Assessment & Plan Note (Signed)
Continue vit D 1000 units daily.

## 2023-10-11 DIAGNOSIS — R001 Bradycardia, unspecified: Secondary | ICD-10-CM | POA: Diagnosis not present

## 2023-10-13 ENCOUNTER — Ambulatory Visit
Admission: RE | Admit: 2023-10-13 | Discharge: 2023-10-13 | Disposition: A | Discharge status: Home / Self Care | Payer: Medicare Other | Source: Ambulatory Visit | Attending: Emergency Medicine | Admitting: Emergency Medicine

## 2023-10-13 VITALS — BP 135/87 | HR 53 | Temp 97.4°F | Resp 16

## 2023-10-13 DIAGNOSIS — J069 Acute upper respiratory infection, unspecified: Secondary | ICD-10-CM | POA: Diagnosis not present

## 2023-10-13 MED ORDER — AMOXICILLIN 500 MG PO CAPS: 500.0000 mg | ORAL_CAPSULE | Freq: Two times a day (BID) | ORAL | 0 refills | Status: AC

## 2023-10-13 NOTE — ED Provider Notes (Signed)
Dylan [name]    CSN: [id number] Arrival date & time: 10/13/23  1450      History   Chief Complaint Chief Complaint  Patient presents with   Influenza    Congestion, chest congestion, cough, runny nose - Entered by patient    HPI Dylan [name] is a 89 y.o. male.   Patient presents for evaluation of nasal and chest congestion and a nonproductive cough present for 3 days.  Intermittent wheezing but denies shortness of breath.  Decreased intake but tolerating foods, decrease intake of fluids.  No known sick contacts.  Denies presence of fever.  Home COVID test negative.  Attempted use of Robitussin max.  History of dementia and all history taken from wife.  Past Medical History:  Diagnosis Date   Basal cell carcinoma 08/31/2020   Mid forehead - ED&C    Basal cell carcinoma of right nasal sidewall 12/15/2014   Bilateral hearing loss 08/03/2018   S/p audiology evaluation, has new hearing aides.   Bradycardia    a. 08/2018 ETT: Poor ex tolerance (2:10); b. 09/2018 s/p MDT Linq.   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 09/2018 s/p MDT Linq; b. 10/2021 Echo: EF 55-60%, no rwma, nl RV fxn, mild MR, triv AI.   Thrombocytopenia (HCC) 02/21/2011   periph smear stable 02/2020   Vitamin B12 deficiency 08/03/2018   Completed b12 shots 09/2018, maintaining levels on oral replacement (dissolvable tablets) 01/2019   Vitamin D deficiency 05/19/2018    Patient Active Problem List   Diagnosis Date Noted   Mitral regurgitation 09/06/2023   Chronic leg pain 03/18/2023   Frequent PVCs 12/10/2022   SVT (supraventricular tachycardia) (HCC) 11/21/2022   Orthostatic syncope 09/30/2022   Chronic diastolic CHF (congestive heart failure) (HCC) 09/30/2022   BPH (benign prostatic hyperplasia) 03/17/2022   DNR (do not resuscitate) 01/11/2022   Paroxysmal atrial flutter (HCC) 11/05/2021   Clostridium difficile colitis 06/04/2021   Left testicular pain 06/04/2021   Sacral pressure sore 06/04/2021   Symptomatic bradycardia 06/04/2021   Fecal impaction (HCC)    Constipation 05/04/2021   Proteinuria 05/04/2021   Orthostatic hypotension 05/04/2021   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   PAD (peripheral artery disease) (HCC) 03/21/2020   Right leg swelling 02/19/2020   Dysphagia 02/19/2020  Pruritus 02/19/2020   Medicare annual wellness visit, subsequent 08/11/2019   CKD (chronic kidney disease) stage 3, GFR 30-59 ml/min (HCC) 01/13/2019   Encounter for general adult medical examination with abnormal findings 08/03/2018   Advanced care planning/counseling discussion 08/03/2018   Vitamin B12 deficiency 08/03/2018   Bilateral hearing loss 08/03/2018   Recurrent syncope 06/22/2018   Vitamin D deficiency  05/19/2018   Fall 05/19/2018   Dementia (HCC) 05/19/2018   Cataract extraction status 03/06/2017   Obstruction of right lacrimal duct 10/16/2015   Exposure keratopathy, 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   Eversion of right lacrimal punctum 05/17/2015   Epiphora due to insufficient drainage of right side 05/17/2015   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   Basal cell carcinoma of right nasal sidewall 12/15/2014   Mohs defect of tear duct of right eye 12/15/2014   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/2014   Mohs defect of eyelid 12/14/2014   Thrombocytopenia (HCC) 02/21/2011   Dyslipidemia 02/04/2008   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   ERECTILE DYSFUNCTION, MILD 02/03/2007   Spermatocele of epididymis, single 02/03/2007    Past Surgical History:  Procedure Laterality Date   BASAL CELL CARCINOMA EXCISION Right 01/2015   with reconstructive surgery around eye   CATARACT EXTRACTION Left 03/2017   ETT  09/2018   negative for ischemia, extremely poor exercise tolerance ([place])   FOOT SURGERY Left 1998   metatarsal amputation after injury   KNEE ARTHROSCOPY  1993   Right   KNEE ARTHROSCOPY  01/2001   Left   LOOP RECORDER INSERTION N/A 09/30/2018   Procedure: LOOP RECORDER INSERTION;  Surgeon: Regan Lemming, MD;  Location: [facility];  Service: Cardiovascular;  Laterality: N/A;       Home Medications    Prior to Admission medications   Medication Sig Start Date End Date Taking? Authorizing Provider  amoxicillin (AMOXIL) 500 MG capsule Take 1 capsule (500 mg total) by mouth 2 (two) times daily for 7 days. 10/13/23 10/20/23 Yes Keagan Anthis, Elita Boone, NP  acetaminophen (TYLENOL) 325 MG tablet Take 2 tablets (650 mg total) by mouth every 6 (six) hours as needed for mild pain (or Fever >/= 101). 08/20/23   Loyce Dys, MD  aspirin EC 81 MG tablet Take 1 tablet (81 mg total) by mouth daily. Swallow whole. 03/18/23   Eustaquio Boyden, MD  atorvastatin (LIPITOR) 20 MG tablet Take 1 tablet (20 mg total) by mouth daily. 09/30/23   Eustaquio Boyden, MD  Cholecalciferol (VITAMIN D3) 1000 units CAPS Take 1 capsule (1,000 Units total) by mouth daily. 08/03/18   Eustaquio Boyden, MD  fexofenadine [facility] ALLERGY) 180 MG tablet Take 1 tablet (180 mg total) by mouth daily. 02/19/22   Eustaquio Boyden, MD  finasteride (PROSCAR) 5 MG tablet Take 1 tablet (5 mg total) by mouth daily. 09/30/23   Eustaquio Boyden, MD  fludrocortisone (FLORINEF) 0.1 MG tablet Take 2 tablets (0.2 mg total) by mouth daily. 09/17/23   Sheilah Pigeon, PA-C  FLUoxetine (PROZAC) 20 MG capsule Take 1 capsule (20 mg total) by mouth daily for 7 days, THEN 2 capsules (40 mg total) daily. 09/25/23 11/01/23  Eustaquio Boyden, MD  guaiFENesin (MUCINEX) 600 MG 12 hr tablet Take 1,200 mg by mouth daily.    [provider]  midodrine (PROAMATINE) 5 MG tablet Take 1 tablet (5 mg total) by mouth 3 (three) times daily with meals. 09/30/23   Eustaquio Boyden, MD  Polyethylene Glycol  3350 (MIRALAX PO) Take 17 g by mouth daily.    [provider]  Probiotic Product (ALIGN PO) Take by mouth daily.    [provider]  Sennosides (SENOKOT PO) Take 1 tablet by mouth at bedtime.    [provider]  vitamin B-12 (V-R VITAMIN B-12) 500 MCG tablet Take 1 tablet (500 mcg total) by mouth daily. Patient taking differently: Take 500 mcg by mouth at bedtime. 08/11/19   Eustaquio Boyden, MD    Family History Family History  Problem Relation Age of Onset   Congestive Heart Failure Mother    Stroke Father 86   Congestive Heart Failure Father    Cataracts Sister    Leukemia Brother     Social History Social History   Tobacco Use   Smoking status: Former    Current packs/day: 0.00    Types: Cigarettes    Quit date: 11/11/1960    Years since quitting: 62.9   Smokeless tobacco: Never  Vaping Use   Vaping status: Never Used  Substance  Use Topics   Alcohol use: No   Drug use: No     Allergies   Doxepin, Seroquel [quetiapine], and Lincomycin hcl   Review of Systems Review of Systems   Physical Exam Triage Vital Signs ED Triage Vitals [10/13/23 1537]  Encounter Vitals Group     BP 135/87     Systolic BP Percentile      Diastolic BP Percentile      Pulse Rate (!) 53     Resp 16     Temp (!) 97.4 F (36.3 C)     Temp Source Temporal     SpO2 94 %     Weight      Height      Head Circumference      Peak Flow      Pain Score      Pain Loc      Pain Education      Exclude from Growth Chart    No data found.  Updated Vital Signs BP 135/87 (BP Location: Left Arm)   Pulse (!) 53   Temp (!) 97.4 F (36.3 C) (Temporal)   Resp 16   SpO2 94%   Visual Acuity Right Eye Distance:   Left Eye Distance:   Bilateral Distance:    Right Eye Near:   Left Eye Near:    Bilateral Near:     Physical Exam Constitutional:      Appearance: Normal appearance.  HENT:     Head: Normocephalic.     Right Ear: Tympanic membrane, ear canal and external ear normal.     Left Ear: Tympanic membrane, ear canal and external ear normal.     Nose: Congestion and rhinorrhea present.     Mouth/Throat:     Pharynx: No oropharyngeal exudate or posterior oropharyngeal erythema.  Eyes:     Extraocular Movements: Extraocular movements intact.  Cardiovascular:     Rate and Rhythm: Normal rate and regular rhythm.     Pulses: Normal pulses.     Heart sounds: Normal heart sounds.  Pulmonary:     Effort: Pulmonary effort is normal.     Breath sounds: Normal breath sounds.  Musculoskeletal:     Cervical back: Neck supple.  Neurological:     Mental Status: He is alert and oriented to person, place, and time. Mental status is at baseline.      UC Treatments / Results  Labs (all labs ordered are  listed, but only abnormal results are displayed) Labs Reviewed - No data to display  EKG   Radiology No results  found.  Procedures Procedures (including critical care time)  Medications Ordered in UC Medications - No data to display  Initial Impression / Assessment and Plan / UC Course  I have reviewed the triage vital signs and the nursing notes.  Pertinent labs & imaging results that were available during my care of the patient were reviewed by me and considered in my medical decision making (see chart for details).  Viral URI with cough  Patient is in no signs of distress nor toxic appearing.  Vital signs are stable.  Low suspicion for pneumonia, pneumothorax or bronchitis and therefore will defer imaging.  Wait antibiotic placed at pharmacy if no improvement seen by day 10, amoxicillin sent.May use additional over-the-counter medications as needed for supportive care.  May follow-up with urgent care as needed if symptoms persist or worsen.   Final Clinical Impressions(s) / UC Diagnoses   Final diagnoses:  Viral URI with cough     Discharge Instructions      Your symptoms today are most likely being caused by a virus and should steadily improve in time it can take up to 7 to 10 days before you truly start to see a turnaround however things will get better if no improvement seen by Sunday may begin use of amoxicillin twice daily for 7 days  On exam his lungs are clear and he is getting enough air without assistance low suspicion for pneumonia or bronchitis at this time    You can take Tylenol and/or Ibuprofen as needed for fever reduction and pain relief.   For cough: honey 1/2 to 1 teaspoon (you can dilute the honey in water or another fluid).  You can also use guaifenesin and dextromethorphan for cough. You can use a humidifier for chest congestion and cough.  If you don't have a humidifier, you can sit in the bathroom with the hot shower running.      For sore throat: try warm salt water gargles, cepacol lozenges, throat spray, warm tea or water with lemon/honey, popsicles or ice, or  OTC cold relief medicine for throat discomfort.   For congestion: take a daily anti-histamine like Zyrtec, Claritin, and a oral decongestant, such as pseudoephedrine.  You can also use Flonase 1-2 sprays in each nostril daily.   It is important to stay hydrated: drink plenty of fluids (water, gatorade/powerade/pedialyte, juices, or teas) to keep your throat moisturized and help further relieve irritation/discomfort.    ED Prescriptions     Medication Sig Dispense Auth. Provider   amoxicillin (AMOXIL) 500 MG capsule Take 1 capsule (500 mg total) by mouth 2 (two) times daily for 7 days. 14 capsule Journey Castonguay, Elita Boone, NP      PDMP not reviewed this encounter.   Valinda Hoar, [place] 10/13/23 [phone]

## 2023-10-13 NOTE — ED Triage Notes (Signed)
Wife at bedside, is MPOA. Pt is dementia pt.  Reports congestion, runny nose, weakness, cough x 3 days. COVID test at [facility] was negative.  No fevers at any point. Pt unable to verbalize if in pain. Has had soup but minimal water.

## 2023-10-13 NOTE — Discharge Instructions (Signed)
Your symptoms today are most likely being caused by a virus and should steadily improve in time it can take up to 7 to 10 days before you truly start to see a turnaround however things will get better if no improvement seen by Sunday may begin use of amoxicillin twice daily for 7 days  On exam his lungs are clear and he is getting enough air without assistance low suspicion for pneumonia or bronchitis at this time    You can take Tylenol and/or Ibuprofen as needed for fever reduction and pain relief.   For cough: honey 1/2 to 1 teaspoon (you can dilute the honey in water or another fluid).  You can also use guaifenesin and dextromethorphan for cough. You can use a humidifier for chest congestion and cough.  If you don't have a humidifier, you can sit in the bathroom with the hot shower running.      For sore throat: try warm salt water gargles, cepacol lozenges, throat spray, warm tea or water with lemon/honey, popsicles or ice, or OTC cold relief medicine for throat discomfort.   For congestion: take a daily anti-histamine like Zyrtec, Claritin, and a oral decongestant, such as pseudoephedrine.  You can also use Flonase 1-2 sprays in each nostril daily.   It is important to stay hydrated: drink plenty of fluids (water, gatorade/powerade/pedialyte, juices, or teas) to keep your throat moisturized and help further relieve irritation/discomfort.

## 2023-10-29 ENCOUNTER — Ambulatory Visit: Payer: Medicare Other | Admitting: Cardiology

## 2023-11-07 ENCOUNTER — Ambulatory Visit (INDEPENDENT_AMBULATORY_CARE_PROVIDER_SITE_OTHER): Payer: Medicare Other

## 2023-11-07 VITALS — Ht 73.0 in | Wt 177.0 lb

## 2023-11-07 DIAGNOSIS — Z0000 Encounter for general adult medical examination without abnormal findings: Secondary | ICD-10-CM | POA: Diagnosis not present

## 2023-11-07 NOTE — Patient Instructions (Signed)
Mr. Wolbert , Thank you for taking time to come for your Medicare Wellness Visit. I appreciate your ongoing commitment to your health goals. Please review the following plan we discussed and let me know if I can assist you in the future.   Referrals/Orders/Follow-Ups/Clinician Recommendations: none  This is a list of the screening recommended for you and due dates:  Health Maintenance  Topic Date Due   Zoster (Shingles) Vaccine (1 of 2) Never done   DTaP/Tdap/Td vaccine (3 - Tdap) 10/03/2020   COVID-19 Vaccine (7 - 2024-25 season) 09/16/2023   Medicare Annual Wellness Visit  11/06/2024   Pneumonia Vaccine  Completed   Flu Shot  Completed   HPV Vaccine  Aged Out    Advanced directives: (In Chart) A copy of your advanced directives are scanned into your chart should your provider ever need it.  Next Medicare Annual Wellness Visit scheduled for next year: Yes 11/09/24 @ 8:50am televisit

## 2023-11-07 NOTE — Progress Notes (Signed)
Subjective:   Dylan [name] is a 89 y.o. male who presents for Medicare Annual/Subsequent preventive examination.  Visit Complete: Virtual I connected with  Dylan [name] (pt's caretaker and wife) regarding Dylan [name] on 11/07/23 by a audio enabled telemedicine application and verified that I am speaking with the correct person using two identifiers.  Patient Location: Home  Provider Location: Home Office  I discussed the limitations of evaluation and management by telemedicine. The patient expressed understanding and agreed to proceed.  Vital Signs: Because this visit was a virtual/telehealth visit, some criteria may be missing or patient reported. Any vitals not documented were not able to be obtained and vitals that have been documented are patient reported.  Patient Medicare AWV questionnaire was completed by the patient on (not done); I have confirmed that all information answered by patient is correct and no changes since this date.  Cardiac Risk Factors include: advanced age (>42men, >62 women);male gender;dyslipidemia;sedentary lifestyle     Objective:    Today's Vitals   11/07/23 1026  Weight: 177 lb (80.3 kg)  Height: 6\' 1"  (1.854 m)   Body mass index is 23.35 kg/m.     11/07/2023   10:39 AM 09/01/2023    8:37 AM 08/19/2023   11:57 AM 04/19/2023   10:31 AM 09/30/2022    6:21 PM 09/30/2022    8:22 AM 08/25/2022    1:02 PM  Advanced Directives  Does Patient Have a Medical Advance Directive? Yes Yes Yes Unable to assess, patient is non-responsive or altered mental status  No Yes  Type of Advance Directive Healthcare Power of [place];Living will Out of facility DNR (pink MOST or yellow form) Out of facility DNR (pink MOST or yellow form)    Healthcare Power of [employer] of Healthcare Power of Attorney in Chart? Yes - validated most recent copy scanned in chart (See row information)        Would patient like information on creating a medical advance  directive?     No - Patient declined    Pre-existing out of facility DNR order (yellow form or pink MOST form)   Yellow form placed in chart (order not valid for inpatient use)        Current Medications (verified) Outpatient Encounter Medications as of 11/07/2023  Medication Sig   acetaminophen (TYLENOL) 325 MG tablet Take 2 tablets (650 mg total) by mouth every 6 (six) hours as needed for mild pain (or Fever >/= 101).   aspirin EC 81 MG tablet Take 1 tablet (81 mg total) by mouth daily. Swallow whole.   atorvastatin (LIPITOR) 20 MG tablet Take 1 tablet (20 mg total) by mouth daily.   Cholecalciferol (VITAMIN D3) 1000 units CAPS Take 1 capsule (1,000 Units total) by mouth daily.   fexofenadine (ALLEGRA ALLERGY) 180 MG tablet Take 1 tablet (180 mg total) by mouth daily.   finasteride (PROSCAR) 5 MG tablet Take 1 tablet (5 mg total) by mouth daily.   fludrocortisone (FLORINEF) 0.1 MG tablet Take 2 tablets (0.2 mg total) by mouth daily.   FLUoxetine (PROZAC) 20 MG capsule Take 1 capsule (20 mg total) by mouth daily for 7 days, THEN 2 capsules (40 mg total) daily.   guaiFENesin (MUCINEX) 600 MG 12 hr tablet Take 1,200 mg by mouth daily.   midodrine (PROAMATINE) 5 MG tablet Take 1 tablet (5 mg total) by mouth 3 (three) times daily with meals.   Polyethylene Glycol 3350 (MIRALAX PO) Take 17 g by mouth  daily.   Probiotic Product (ALIGN PO) Take by mouth daily.   Sennosides (SENOKOT PO) Take 1 tablet by mouth at bedtime.   vitamin B-12 (V-R VITAMIN B-12) 500 MCG tablet Take 1 tablet (500 mcg total) by mouth daily. (Patient taking differently: Take 500 mcg by mouth at bedtime.)   No facility-administered encounter medications on file as of 11/07/2023.    Allergies (verified) Doxepin, Seroquel [quetiapine], and Lincomycin hcl   History: Past Medical History:  Diagnosis Date   Basal cell carcinoma 08/31/2020   Mid forehead - ED&C    Basal cell carcinoma of right nasal sidewall 12/15/2014    Bilateral hearing loss 08/03/2018   S/p audiology evaluation, has new hearing aides.   Bradycardia    a. 08/2018 ETT: Poor ex tolerance (2:10); b. 09/2018 s/p MDT Linq.   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/2014   Mohs defect of eyelid 12/14/2014   Nuclear sclerosis, left 04/01/2017   Obstruction of right lacrimal duct 10/16/2015   PAD (peripheral artery disease) (HCC) 03/21/2020   Abnormal ABI, abnormal arterial duplex 03/2020:  R - moderate RLE disease, abnormal TBI L - WNL, abnormal TBI Referred for vascular consult - rec medical management   Pruritus 02/19/2020   Recurrent syncope 06/22/2018   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 09/2018 s/p MDT Linq; b. 10/2021 Echo: EF 55-60%, no rwma, nl RV fxn, mild MR, triv AI.   Thrombocytopenia (HCC) 02/21/2011   periph smear stable 02/2020   Vitamin B12 deficiency 08/03/2018   Completed b12 shots 09/2018, maintaining levels on oral replacement (dissolvable tablets) 01/2019   Vitamin D deficiency 05/19/2018   Past Surgical History:  Procedure Laterality Date   BASAL CELL CARCINOMA EXCISION Right 01/2015   with reconstructive surgery around eye   CATARACT EXTRACTION Left 03/2017   ETT  09/2018    negative for ischemia, extremely poor exercise tolerance ([place])   FOOT SURGERY Left 1998   metatarsal amputation after injury   KNEE ARTHROSCOPY  1993   Right   KNEE ARTHROSCOPY  01/2001   Left   LOOP RECORDER INSERTION N/A 09/30/2018   Procedure: LOOP RECORDER INSERTION;  Surgeon: Regan Lemming, MD;  Location: [facility];  Service: Cardiovascular;  Laterality: N/A;   Family History  Problem Relation Age of Onset   Congestive Heart Failure Mother    Stroke Father 68   Congestive Heart Failure Father    Cataracts Sister    Leukemia Brother    Social History   Socioeconomic History   Marital status: Married    Spouse name: Not on file   Number of children: 3   Years of education: Not on file   Highest education level: Not on  file  Occupational History   Occupation: Retired    Comment: 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, broadcasting, Location manager  Tobacco Use   Smoking status: Former    Current packs/day: 0.00    Types: Cigarettes    Quit date: 11/11/1960    Years since quitting: 63.0   Smokeless tobacco: Never  Vaping Use   Vaping status: Never Used  Substance and Sexual Activity   Alcohol use: No   Drug use: No   Sexual activity: Not Currently  Other Topics Concern   Not on file  Social History Narrative   Lives with wife [place])   One stepson   Occ: retired, Agricultural consultant, 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, PDR   Edu: HS   Social Drivers of Corporate investment banker Strain: Low Risk  (11/07/2023)   Overall Financial Resource Strain (CARDIA)    Difficulty of Paying Living Expenses: Not hard at all  Food Insecurity: No Food Insecurity (11/07/2023)   Hunger Vital Sign    Worried About Running Out of Food in the Last Year: Never true    Ran Out of Food in the Last Year: Never true  Transportation Needs: No Transportation Needs (11/07/2023)   PRAPARE - Administrator, Civil Service (Medical): No    Lack of Transportation  (Non-Medical): No  Physical Activity: Inactive (11/07/2023)   Exercise Vital Sign    Days of Exercise per Week: 0 days    Minutes of Exercise per Session: 0 min  Stress: No Stress Concern Present (11/07/2023)   [employer] of Occupational Health - Occupational Stress Questionnaire    Feeling of Stress : Not at all  Social Connections: Moderately Integrated (11/07/2023)   Social Connection and Isolation Panel [NHANES]    Frequency of Communication with Friends and Family: Three times a week    Frequency of Social Gatherings with Friends and Family: Once a week    Attends Religious Services: More than 4 times per year    Active Member of [employer] or Organizations: No    Attends Engineer, structural: Never    Marital Status: Married    Tobacco Counseling Counseling given: Not Answered  Clinical Intake:  Pre-visit preparation completed: No  Pain : No/denies pain (wife asked pt)   BMI - recorded: 23.35 Nutritional Status: BMI of 19-24  Normal Nutritional Risks: None Diabetes: No  How often do you need to have someone help you when you read instructions, pamphlets, or other written materials from your doctor or pharmacy?: 1 - Never  Interpreter Needed?: No  Comments: lives with wife who is caregiver Information entered by :: B.Cornelio Parkerson,LPN   Activities of Daily Living    11/06/2023    5:23 PM  In your present state of health, do you have any difficulty performing the following activities:  Hearing? 1  Vision? 0  Difficulty concentrating or making decisions? 1  Doing errands, shopping? 1  Preparing Food and eating ? Y  Using the Toilet? Y  In the past six months, have you accidently leaked urine? Y  Do you have problems with loss of bowel control? Y  Managing your Medications? Y  Managing your Finances? Y  Housekeeping or managing your Housekeeping? Y    Patient Care Team: Eustaquio Boyden, MD as PCP - General (Family Medicine) Regan Lemming,  MD as PCP - Electrophysiology (Cardiology) Runell Gess, MD as PCP - Cardiology (Cardiology) Kathyrn Sheriff, [facility] (Inactive) as Pharmacist (Pharmacist)  Indicate  any recent Medical Services you may have received from other than [facility] providers in the past year (date may be approximate).     Assessment:   This is a routine wellness examination for [place].  Hearing/Vision screen Hearing Screening - Comments:: Pt wife says he has hearing aids and hears adequately  Vision Screening - Comments:: Wife says 20/20 vision Dr Dion Body   Goals Addressed             This Visit's Progress    DIET - INCREASE WATER INTAKE   On track    Starting 07/29/2018, I will attempt to drink 1 glass with each meal.      Track and Manage My Blood Pressure-Hypertension   On track    Timeframe:  Long-Range Goal Priority:  High Start Date:  10/15/21                            Expected End Date:      10/15/22                 Follow Up Date June 2023   - check blood pressure 3 times per week - choose a place to take my blood pressure (home, clinic or office, retail store) - write blood pressure results in a log or diary    Why is this important?   You won't feel high blood pressure, but it can still hurt your blood vessels.  High blood pressure can cause heart or kidney problems. It can also cause a stroke.  Making lifestyle changes like losing a little weight or eating less salt will help.  Checking your blood pressure at home and at different times of the day can help to control blood pressure.  If the doctor prescribes medicine remember to take it the way the doctor ordered.  Call the office if you cannot afford the medicine or if there are questions about it.     Notes:        Depression Screen    11/07/2023   10:36 AM 09/30/2023    3:08 PM 09/03/2023   10:25 AM 06/06/2023    3:44 PM 04/22/2023    3:09 PM 03/18/2023   11:03 AM 01/21/2023    2:17 PM  PHQ 2/9 Scores  PHQ - 2 Score 0         Exception Documentation  Medical reason Medical reason Medical reason Medical reason Medical reason Other- indicate reason in comment box    Fall Risk    11/06/2023    5:23 PM 12/10/2022   10:04 AM 08/20/2022   11:17 AM 08/20/2021   10:26 AM 08/16/2020   12:27 PM  Fall Risk   Falls in the past year? 1 1 0 0 1  Number falls in past yr: 1 1 0  0  Injury with Fall? 0 0 0  0  Risk for fall due to : Impaired balance/gait;Other (Comment)  No Fall Risks    Risk for fall due to: Comment heart problems      Follow up Education provided;Falls prevention discussed  Falls evaluation completed      MEDICARE RISK AT HOME: Medicare Risk at Home Any stairs in or around the home?: (Patient-Rptd) Yes If so, are there any without handrails?: (Patient-Rptd) No Home free of loose throw rugs in walkways, pet beds, electrical cords, etc?: (Patient-Rptd) Yes Adequate lighting in your home to reduce risk of falls?: (Patient-Rptd) Yes Life alert?: (Patient-Rptd)  No Grab bars in the bathroom?: (Patient-Rptd) Yes Shower chair or bench in shower?: (Patient-Rptd) Yes Elevated toilet seat or a handicapped toilet?: (Patient-Rptd) Yes  TIMED UP AND GO:  Was the test performed?  No    Cognitive Function:    11/07/2023   10:41 AM 08/02/2018    1:35 PM  MMSE - Mini Mental State Exam  Not completed: Unable to complete   Orientation to time  5  Orientation to Place  5  Registration  3  Attention/ Calculation  0  Recall  3  Language- name 2 objects  0  Language- repeat  1  Language- follow 3 step command  3  Language- read & follow direction  0  Write a sentence  0  Copy design  0  Total score  20       Immunizations Immunization History  Administered Date(s) Administered   Fluad Quad(high Dose 65+) 08/11/2019, 08/16/2020, 08/20/2021, 08/21/2022, 07/22/2023   Influenza Whole 09/11/2002   PFIZER Comirnaty(Gray Top)Covid-19 Tri-Sucrose Vaccine 09/19/2022   PFIZER(Purple Top)SARS-COV-2  Vaccination 11/26/2019, 12/17/2019, 08/25/2020   PNEUMOCOCCAL CONJUGATE-20 08/21/2022   PPD Test 01/22/2022   Pfizer Covid-19 Vaccine Bivalent Booster 39yrs & up 08/23/2021   Pfizer(Comirnaty)Fall Seasonal Vaccine 12 years and older 07/22/2023   Pneumococcal Polysaccharide-23 08/16/2020   Respiratory Syncytial Virus Vaccine,Recomb Aduvanted(Arexvy) 09/19/2022   Td 01/24/2005, 10/03/2010    TDAP status: Up to date  Flu Vaccine status: Up to date  Pneumococcal vaccine status: Up to date  Covid-19 vaccine status: Completed vaccines  Qualifies for Shingles Vaccine? Yes   Zostavax completed No   Shingrix Completed?: No.    Education has been provided regarding the importance of this vaccine. Patient has been advised to call insurance company to determine out of pocket expense if they have not yet received this vaccine. Advised may also receive vaccine at local pharmacy or Health Dept. Verbalized acceptance and understanding.  Screening Tests Health Maintenance  Topic Date Due   Zoster Vaccines- Shingrix (1 of 2) Never done   DTaP/Tdap/Td (3 - Tdap) 10/03/2020   COVID-19 Vaccine (7 - 2024-25 season) 09/16/2023   Medicare Annual Wellness (AWV)  11/06/2024   Pneumonia Vaccine 46+ Years old  Completed   INFLUENZA VACCINE  Completed   HPV VACCINES  Aged Out    Health Maintenance  Health Maintenance Due  Topic Date Due   Zoster Vaccines- Shingrix (1 of 2) Never done   DTaP/Tdap/Td (3 - Tdap) 10/03/2020   COVID-19 Vaccine (7 - 2024-25 season) 09/16/2023    Colorectal cancer screening: No longer required.   Lung Cancer Screening: (Low Dose CT Chest recommended if Age 77-80 years, 20 pack-year currently smoking OR have quit w/in 15years.) does not qualify.   Lung Cancer Screening Referral: no  Additional Screening:  Hepatitis C Screening: does not qualify; Completed   Vision Screening: Recommended annual ophthalmology exams for early detection of glaucoma and other disorders of  the eye. Is the patient up to date with their annual eye exam?  Yes  Who is the provider or what is the name of the office in which the patient attends annual eye exams? Dr Dion Body If pt is not established with a provider, would they like to be referred to a provider to establish care? No .   Dental Screening: Recommended annual dental exams for proper oral hygiene  Diabetic Foot Exam: n/a  Community Resource Referral / Chronic Care Management: CRR required this visit?  No   CCM required this visit?  No    Plan:     I have personally reviewed and noted the following in the patient's chart:   Medical and social history Use of alcohol, tobacco or illicit drugs  Current medications and supplements including opioid prescriptions. Patient is not currently taking opioid prescriptions. Functional ability and status Nutritional status Physical activity Advanced directives List of other physicians Hospitalizations, surgeries, and ER visits in previous 12 months Vitals Screenings to include cognitive, depression, and falls Referrals and appointments  In addition, I have reviewed and discussed with patient certain preventive protocols, quality metrics, and best practice recommendations. A written personalized care plan for preventive services as well as general preventive health recommendations were provided to patient.    Sue Lush, LPN   [id number]/08/[id number]   After Visit Summary: (MyChart) Due to this being a telephonic visit, the after visit summary with patients personalized plan was offered to patient via MyChart   Nurse Notes: I spoke with pts wife/caretaker regarding AWV questions. She relays pt has advanced dementia and cannot do the visit. She relays pt is just recovering from a stomach virus. She has not questions but wanted to make appt with PCP due to pulse in 40's. She relays she has contacted his cardiologist and no advice given yet and offered an appt in March. I made  appt first available with PCP. She did not want to schedule with another provider. I asked wife to utilize emergent services if needed prior to visit.  She relays his BP is normal and he is not weak or tired. She is comfortable with appt made for pt.

## 2023-11-18 ENCOUNTER — Encounter: Payer: Self-pay | Admitting: Family Medicine

## 2023-11-18 ENCOUNTER — Ambulatory Visit (INDEPENDENT_AMBULATORY_CARE_PROVIDER_SITE_OTHER): Payer: Medicare Other | Admitting: Family Medicine

## 2023-11-18 VITALS — BP 132/68 | HR 48 | Temp 98.4°F | Ht 73.0 in | Wt 172.5 lb

## 2023-11-18 DIAGNOSIS — I452 Bifascicular block: Secondary | ICD-10-CM | POA: Diagnosis not present

## 2023-11-18 DIAGNOSIS — F02C18 Dementia in other diseases classified elsewhere, severe, with other behavioral disturbance: Secondary | ICD-10-CM

## 2023-11-18 DIAGNOSIS — I951 Orthostatic hypotension: Secondary | ICD-10-CM | POA: Diagnosis not present

## 2023-11-18 DIAGNOSIS — H6122 Impacted cerumen, left ear: Secondary | ICD-10-CM | POA: Diagnosis not present

## 2023-11-18 DIAGNOSIS — R001 Bradycardia, unspecified: Secondary | ICD-10-CM | POA: Diagnosis not present

## 2023-11-18 DIAGNOSIS — G301 Alzheimer's disease with late onset: Secondary | ICD-10-CM | POA: Diagnosis not present

## 2023-11-18 DIAGNOSIS — I739 Peripheral vascular disease, unspecified: Secondary | ICD-10-CM

## 2023-11-18 DIAGNOSIS — F66 Other sexual disorders: Secondary | ICD-10-CM

## 2023-11-18 DIAGNOSIS — H6120 Impacted cerumen, unspecified ear: Secondary | ICD-10-CM | POA: Insufficient documentation

## 2023-11-18 NOTE — Assessment & Plan Note (Addendum)
 No recent syncope.  Continue abd binder, liberalized salt intake. Avoid compression stockings in PAD.

## 2023-11-18 NOTE — Assessment & Plan Note (Signed)
 Able to use prozac now off amiodarine.  Doing better on prozac instead of cymbalta.

## 2023-11-18 NOTE — Assessment & Plan Note (Signed)
 Continues fludrocortisone 0.2mg  daily along with midodrine 5mg  TID with meals  No recent syncope.  Ongoing bradycardia - see below.

## 2023-11-18 NOTE — Assessment & Plan Note (Addendum)
 Saw Dr Allyson Sabal 2024 - rec medical management.  Avoid compression stockings.

## 2023-11-18 NOTE — Patient Instructions (Addendum)
 Left ear irrigation today Will await evaluation by Dr Waylan Rocher to see you today We will see you next month.

## 2023-11-18 NOTE — Assessment & Plan Note (Signed)
 Chronic bradycardia, heart rate has stayed in 40s for the past month, but largely asymptomatic. Midodrine could contribute to bradycardia, but it is beneficial for orthostatic syncope - will continue. Keep EP f/u later this week.

## 2023-11-18 NOTE — Assessment & Plan Note (Signed)
 Pt did not tolerate attempts at ear irrigation or cerumen disimpaction in office. They decline ENT eval. Will try home measures discussed - dilute H2O2 vs OTC debrox. Reassess at next month's appt.

## 2023-11-18 NOTE — Progress Notes (Signed)
 Ph: [phone] Fax: [phone]   Patient ID: Dylan [name], male    DOB: 09/26/1934, 89 y.o.   MRN: [id number]  This visit was conducted in person.  BP 132/68   Pulse (!) 48   Temp 98.4 F (36.9 C) (Tympanic)   Ht 6' 1 (1.854 m)   Wt 172 lb 8 oz (78.2 kg)   SpO2 98%   BMI 22.76 kg/m    CC: bradycardia  Subjective:   HPI: Dylan [name] is a 89 y.o. male presenting on 11/18/2023 for Medical Management of Chronic Issues (Here for decreased HR- in the 40s for 4-5 wks. Pt accompanied by wife, Dylan [name]. Per [facility], pt has cards f/u OV on 11/20/23.)   Recent eval at [facility] 10/2023 for respiratory infection with cough, did not need amoxicillin  500mg  antibiotic course. Treated with OTC robitussin. Wife has been sick as well.   Chronic orthostatic hypotension complicated by syncope managed with fludrocortisone  0.2mg  daily along with midodrine  5mg  TID with meals. He continues abdominal binder, liberalized salt intake. Avoiding compression stockings due to h/o R popliteal PAD.   Throughout the month of December, heart rate stayed 40s. He was more fatigued, tired and unsteady. UCC noted wax in ear ?R.   Baseline heart conduction disease, RBBB, LAD.  Advanced dementia complicates care.  Upcoming cardiology appt 11/20/2023 ([place]).   Now off amiodarone , we changed from cymbalta  to prozac  for sexual disinhibition with benefit (09/2023). Continues sucking finger/thumb.   Palliative care stopped by the home, noticing worsening status, now planned monthly evaluation.      Relevant past medical, surgical, family and social history reviewed and updated as indicated. Interim medical history since our last visit reviewed. Allergies and medications reviewed and updated. Outpatient Medications Prior to Visit  Medication Sig Dispense Refill   acetaminophen  (TYLENOL ) 325 MG tablet Take 2 tablets (650 mg total) by mouth every 6 (six) hours as needed for mild pain (or Fever >/= 101).  20 tablet 0   aspirin  EC 81 MG tablet Take 1 tablet (81 mg total) by mouth daily. Swallow whole.     atorvastatin  (LIPITOR) 20 MG tablet Take 1 tablet (20 mg total) by mouth daily. 90 tablet 4   Cholecalciferol  (VITAMIN D3) 1000 units CAPS Take 1 capsule (1,000 Units total) by mouth daily. 30 capsule    fexofenadine  (ALLEGRA  ALLERGY) 180 MG tablet Take 1 tablet (180 mg total) by mouth daily.     finasteride  (PROSCAR ) 5 MG tablet Take 1 tablet (5 mg total) by mouth daily. 90 tablet 4   fludrocortisone  (FLORINEF ) 0.1 MG tablet Take 2 tablets (0.2 mg total) by mouth daily. 180 tablet 3   guaiFENesin  (MUCINEX ) 600 MG 12 hr tablet Take 1,200 mg by mouth daily.     midodrine  (PROAMATINE ) 5 MG tablet Take 1 tablet (5 mg total) by mouth 3 (three) times daily with meals. 90 tablet 6   Polyethylene Glycol 3350  (MIRALAX  PO) Take 17 g by mouth daily.     Probiotic Product (ALIGN PO) Take by mouth daily.     Sennosides (SENOKOT PO) Take 1 tablet by mouth at bedtime.     vitamin B-12 (V-R VITAMIN B-12) 500 MCG tablet Take 1 tablet (500 mcg total) by mouth daily. (Patient taking differently: Take 500 mcg by mouth at bedtime.)     FLUoxetine  (PROZAC ) 20 MG capsule Take 1 capsule (20 mg total) by mouth daily for 7 days, THEN 2 capsules (40 mg total) daily. 60 capsule  6   No facility-administered medications prior to visit.     Per HPI unless specifically indicated in ROS section below Review of Systems  Objective:  BP 132/68   Pulse (!) 48   Temp 98.4 F (36.9 C) (Tympanic)   Ht 6' 1 (1.854 m)   Wt 172 lb 8 oz (78.2 kg)   SpO2 98%   BMI 22.76 kg/m   Wt Readings from Last 3 Encounters:  11/18/23 172 lb 8 oz (78.2 kg)  11/07/23 177 lb (80.3 kg)  09/30/23 177 lb (80.3 kg)      Physical Exam Vitals and nursing note reviewed.  Constitutional:      Appearance: Normal appearance. He is not ill-appearing.  HENT:     Head: Normocephalic and atraumatic.     Right Ear: Tympanic membrane, ear canal  and external ear normal. Decreased hearing noted. There is no impacted cerumen.     Left Ear: External ear normal. Decreased hearing noted. There is impacted cerumen.     Ears:     Comments: Wears hearing aides Did not tolerate attempts at ear irrigation or cerumen disimpaction in office     Mouth/Throat:     Mouth: Mucous membranes are moist.     Pharynx: Oropharynx is clear. No oropharyngeal exudate or posterior oropharyngeal erythema.  Eyes:     Extraocular Movements: Extraocular movements intact.     Conjunctiva/sclera: Conjunctivae normal.     Pupils: Pupils are equal, round, and reactive to light.  Cardiovascular:     Rate and Rhythm: Regular rhythm. Bradycardia present.     Pulses: Normal pulses.     Heart sounds: Normal heart sounds. No murmur heard. Pulmonary:     Effort: Pulmonary effort is normal. No respiratory distress.     Breath sounds: Normal breath sounds. No wheezing, rhonchi or rales.  Musculoskeletal:     Right lower leg: No edema.     Left lower leg: No edema.  Skin:    General: Skin is warm and dry.     Findings: No rash.  Neurological:     Mental Status: He is alert.  Psychiatric:        Mood and Affect: Mood normal.        Behavior: Behavior normal.        Assessment & Plan:   Problem List Items Addressed This Visit     Right bundle branch block with left anterior fascicular block   Dementia (HCC)   PAD (peripheral artery disease) (HCC)   Saw Dr Court 2024 - rec medical management.  Avoid compression stockings.       Sexual disinhibition   Able to use prozac  now off amiodarine.  Doing better on prozac  instead of cymbalta .       Orthostatic hypotension - Primary   Continues fludrocortisone  0.2mg  daily along with midodrine  5mg  TID with meals  No recent syncope.  Ongoing bradycardia - see below.       Symptomatic bradycardia   Chronic bradycardia, heart rate has stayed in 40s for the past month, but largely asymptomatic. Midodrine  could  contribute to bradycardia, but it is beneficial for orthostatic syncope - will continue. Keep EP f/u later this week.       Orthostatic syncope   No recent syncope.  Continue abd binder, liberalized salt intake. Avoid compression stockings in PAD.       Cerumen impaction   Pt did not tolerate attempts at ear irrigation or cerumen disimpaction in office. They decline ENT  eval. Will try home measures discussed - dilute H2O2 vs OTC debrox. Reassess at next month's appt.         No orders of the defined types were placed in this encounter.   No orders of the defined types were placed in this encounter.   Patient Instructions  Left ear irrigation today Will await evaluation by Dr Inocencio Richard to see you today We will see you next month.   Follow up plan: Return if symptoms worsen or fail to improve.  Anton Blas, MD

## 2023-11-20 ENCOUNTER — Encounter: Payer: Self-pay | Admitting: Cardiology

## 2023-11-20 ENCOUNTER — Ambulatory Visit: Payer: Medicare Other | Attending: Cardiology | Admitting: Cardiology

## 2023-11-20 VITALS — BP 140/86 | HR 52 | Ht 73.0 in | Wt 171.8 lb

## 2023-11-20 DIAGNOSIS — I493 Ventricular premature depolarization: Secondary | ICD-10-CM | POA: Diagnosis not present

## 2023-11-20 DIAGNOSIS — I4510 Unspecified right bundle-branch block: Secondary | ICD-10-CM

## 2023-11-20 DIAGNOSIS — R001 Bradycardia, unspecified: Secondary | ICD-10-CM | POA: Diagnosis not present

## 2023-11-20 DIAGNOSIS — R55 Syncope and collapse: Secondary | ICD-10-CM | POA: Diagnosis not present

## 2023-11-20 DIAGNOSIS — I480 Paroxysmal atrial fibrillation: Secondary | ICD-10-CM | POA: Diagnosis not present

## 2023-11-20 DIAGNOSIS — I4892 Unspecified atrial flutter: Secondary | ICD-10-CM | POA: Diagnosis not present

## 2023-11-20 NOTE — Progress Notes (Signed)
   Electrophysiology Office Note:   Date:  11/20/2023  ID:  Dylan [name], DOB Nov 09, 1934, MRN [id number]  Primary Cardiologist: Dorn Lesches, MD Primary Heart Failure: None Electrophysiologist: Tashala Cumbo Gladis Norton, MD      History of Present Illness:   Dylan [name] is a 89 y.o. male with h/o hyperlipidemia, dementia, CKD, syncope seen today for routine electrophysiology followup.   Since last being seen in our clinic the patient reports doing overall well.  Per his family, he is sleeping more.  His dementia is progressing.  He got sick in November, and his blood pressure was lower.  It has improved since then.  His blood pressure is 115 in the mornings.  Once he gets up and starts moving around, his blood pressure does get up into the 130s.  He has not had further episodes of syncope.  he denies chest pain, palpitations, dyspnea, PND, orthopnea, nausea, vomiting, dizziness, syncope, edema, weight gain, or early satiety.   Review of systems complete and found to be negative unless listed in HPI.   Device History: Medtronic loop recorder implanted for syncope  EP Information / Studies Reviewed:    EKG is ordered today. Personal review as below.  EKG Interpretation Date/Time:  Thursday November 20 2023 15:13:42 EST Ventricular Rate:  54 PR Interval:  124 QRS Duration:  164 QT Interval:  558 QTC Calculation: 529 R Axis:   -76  Text Interpretation: Sinus bradycardia with Premature atrial complexes with Abberant conduction Right bundle branch block Left anterior fascicular block Bifascicular block Confirmed by Perlie Scheuring ([id number]) on 11/20/2023 3:23:59 PM     Risk Assessment/Calculations:             Physical Exam:   VS:  BP (!) 140/86 (BP Location: Left Arm, Patient Position: Sitting, Cuff Size: Normal)   Pulse (!) 52   Ht 6' 1 (1.854 m)   Wt 171 lb 12.8 oz (77.9 kg)   SpO2 91%   BMI 22.67 kg/m    Wt Readings from Last 3 Encounters:  11/20/23 171 lb 12.8 oz (77.9 kg)   11/18/23 172 lb 8 oz (78.2 kg)  11/07/23 177 lb (80.3 kg)     GEN: Well nourished, well developed in no acute distress NECK: No JVD; No carotid bruits CARDIAC: Regular rate and rhythm, no murmurs, rubs, gallops RESPIRATORY:  Clear to auscultation without rales, wheezing or rhonchi  ABDOMEN: Soft, non-tender, non-distended EXTREMITIES:  No edema; No deformity   ILR Interrogation- reviewed in detail today,  See PACEART report  ASSESSMENT AND PLAN:    Syncope s/p Medtronic Loop recorder Normal device function See Pace Art report No changes today  2.  Atrial flutter: CHA2DS2-VASc of at least 2.  Due to his history of falls with dementia, no anticoagulation for now.  3.  PVCs: Amiodarone  on hold due to multiple episodes of syncope and orthostasis.  4.  Orthostasis: Currently on Florinef  and midodrine .  Blood pressure well-controlled.  No changes.     Follow up with EP APP in 6 months  Signed, Breawna Montenegro Gladis Norton, MD

## 2023-12-01 ENCOUNTER — Other Ambulatory Visit: Payer: Self-pay | Admitting: Family Medicine

## 2023-12-17 ENCOUNTER — Other Ambulatory Visit: Payer: Self-pay

## 2023-12-17 ENCOUNTER — Emergency Department
Admission: EM | Admit: 2023-12-17 | Discharge: 2023-12-17 | Disposition: A | Discharge status: Home / Self Care | Payer: Medicare Other | Attending: Emergency Medicine | Admitting: Emergency Medicine

## 2023-12-17 DIAGNOSIS — N189 Chronic kidney disease, unspecified: Secondary | ICD-10-CM | POA: Insufficient documentation

## 2023-12-17 DIAGNOSIS — I499 Cardiac arrhythmia, unspecified: Secondary | ICD-10-CM | POA: Diagnosis not present

## 2023-12-17 DIAGNOSIS — Z743 Need for continuous supervision: Secondary | ICD-10-CM | POA: Diagnosis not present

## 2023-12-17 DIAGNOSIS — I4510 Unspecified right bundle-branch block: Secondary | ICD-10-CM | POA: Diagnosis not present

## 2023-12-17 DIAGNOSIS — R404 Transient alteration of awareness: Secondary | ICD-10-CM | POA: Diagnosis not present

## 2023-12-17 DIAGNOSIS — F0390 Unspecified dementia without behavioral disturbance: Secondary | ICD-10-CM | POA: Insufficient documentation

## 2023-12-17 DIAGNOSIS — R6889 Other general symptoms and signs: Secondary | ICD-10-CM | POA: Diagnosis not present

## 2023-12-17 DIAGNOSIS — R55 Syncope and collapse: Secondary | ICD-10-CM | POA: Diagnosis not present

## 2023-12-17 LAB — HEPATIC FUNCTION PANEL
ALT: 18 U/L (ref 0–44)
AST: 34 U/L (ref 15–41)
Albumin: 3.6 g/dL (ref 3.5–5.0)
Alkaline Phosphatase: 83 U/L (ref 38–126)
Bilirubin, Direct: 0.4 mg/dL — ABNORMAL HIGH (ref 0.0–0.2)
Indirect Bilirubin: 1.4 mg/dL — ABNORMAL HIGH (ref 0.3–0.9)
Total Bilirubin: 1.8 mg/dL — ABNORMAL HIGH (ref 0.0–1.2)
Total Protein: 6.6 g/dL (ref 6.5–8.1)

## 2023-12-17 LAB — TROPONIN I (HIGH SENSITIVITY): Troponin I (High Sensitivity): 11 ng/L (ref <18)

## 2023-12-17 LAB — BASIC METABOLIC PANEL
Anion gap: 11 (ref 5–15)
BUN: 15 mg/dL (ref 8–23)
CO2: 26 mmol/L (ref 22–32)
Calcium: 8.5 mg/dL — ABNORMAL LOW (ref 8.9–10.3)
Chloride: 102 mmol/L (ref 98–111)
Creatinine, Ser: 1.3 mg/dL — ABNORMAL HIGH (ref 0.61–1.24)
GFR, Estimated: 53 mL/min — ABNORMAL LOW (ref >60)
Glucose, Bld: 141 mg/dL — ABNORMAL HIGH (ref 70–99)
Potassium: 3.8 mmol/L (ref 3.5–5.1)
Sodium: 139 mmol/L (ref 135–145)

## 2023-12-17 LAB — CBC
HCT: 43.1 % (ref 39.0–52.0)
Hemoglobin: 15.2 g/dL (ref 13.0–17.0)
MCH: 32.7 pg (ref 26.0–34.0)
MCHC: 35.3 g/dL (ref 30.0–36.0)
MCV: 92.7 fL (ref 80.0–100.0)
Platelets: 125 10*3/uL — ABNORMAL LOW (ref 150–400)
RBC: 4.65 MIL/uL (ref 4.22–5.81)
RDW: 13.4 % (ref 11.5–15.5)
WBC: 8.8 10*3/uL (ref 4.0–10.5)
nRBC: 0 % (ref 0.0–0.2)

## 2023-12-17 LAB — MAGNESIUM: Magnesium: 2.1 mg/dL (ref 1.7–2.4)

## 2023-12-17 MED ORDER — MIDODRINE HCL 5 MG PO TABS
5.0000 mg | ORAL_TABLET | Freq: Once | ORAL | Status: AC
  Administered 2023-12-17: 5 mg via ORAL
  Filled 2023-12-17: qty 1

## 2023-12-17 NOTE — Discharge Instructions (Signed)
 You were seen in the emergency department after passing out (syncope).  Continue to your primary care doctor and cardiology for further evaluation. Return to the ER immediately if you develop chest pain, shortness of breath, it feels like your heart is racing, repeated episodes, or other new or concerning symptoms.

## 2023-12-17 NOTE — ED Provider Notes (Signed)
 [facility] Provider Note    Event Date/Time   First MD Initiated Contact with Patient 12/17/23 [phone]     (approximate)   History   Near Syncope   HPI  Dylan [name] is a 89 year old male with history of dementia, recurrent syncope due to orthostatic hypotension, CKD presenting to the emergency department following a syncopal episode. Initial report that patient was having a bowel movement when he passed out. Wife later clarifies that he was in the bathroom, but she was washing his hair when patient had a witnessed syncopal episode. He had low blood pressure for EMS, but had not yet taken his home midodrine  this morning.  Patient with advanced dementia, able to tell me his name, unable to provide further history.     Physical Exam   Triage Vital Signs: ED Triage Vitals  Encounter Vitals Group     BP 12/17/23 0750 (!) 121/56     Systolic BP Percentile --      Diastolic BP Percentile --      Pulse Rate 12/17/23 0750 (!) 49     Resp 12/17/23 0750 16     Temp 12/17/23 0758 (!) 97.3 F (36.3 C)     Temp Source 12/17/23 0758 Axillary     SpO2 12/17/23 0750 99 %     Weight 12/17/23 0749 165 lb (74.8 kg)     Height 12/17/23 0749 6' (1.829 m)     Head Circumference --      Peak Flow --      Pain Score 12/17/23 0749 0     Pain Loc --      Pain Education --      Exclude from Growth Chart --     Most recent vital signs: Vitals:   12/17/23 0750 12/17/23 0758  BP: (!) 121/56   Pulse: (!) 49   Resp: 16   Temp:  (!) 97.3 F (36.3 C)  SpO2: 99%      General: Awake, interactive  CV:  Bradycardic with regular rhythm, good peripheral perfusion Resp:  Unlabored respirations, lungs clear to auscultation Abd:  Nondistended, soft, nontender to palpation Neuro:  Symmetric facial movement, limited speech but no appreciable dysarthria, symmetric strength of bilateral upper and lower extremities   ED Results / Procedures / Treatments   Labs (all labs  ordered are listed, but only abnormal results are displayed) Labs Reviewed  BASIC METABOLIC PANEL - Abnormal; Notable for the following components:      Result Value   Glucose, Bld 141 (*)    Creatinine, Ser 1.30 (*)    Calcium  8.5 (*)    GFR, Estimated 53 (*)    All other components within normal limits  CBC - Abnormal; Notable for the following components:   Platelets 125 (*)    All other components within normal limits  HEPATIC FUNCTION PANEL - Abnormal; Notable for the following components:   Total Bilirubin 1.8 (*)    Bilirubin, Direct 0.4 (*)    Indirect Bilirubin 1.4 (*)    All other components within normal limits  MAGNESIUM   TROPONIN I (HIGH SENSITIVITY)     EKG EKG independently reviewed interpreted by myself (ER attending) demonstrates:  EKG demonstrates this rhythm at a rate of 51, PR 133, QRS 190, QTc 510, no acute ST changes  RADIOLOGY Imaging independently reviewed and interpreted by myself demonstrates:    PROCEDURES:  Critical Care performed: No  Procedures   MEDICATIONS ORDERED IN ED: Medications  midodrine  (PROAMATINE ) tablet 5 mg (5 mg Oral Given 12/17/23 0800)     IMPRESSION / MDM / ASSESSMENT AND PLAN / ED COURSE  I reviewed the triage vital signs and the nursing notes.  Differential diagnosis includes, but is not limited to, to be due to vasovagal to be in the setting of having a bowel movement, orthostatic hypotension with missed midodrine , anemia, electrolyte abnormality, consideration for arrhythmia, ACS though overall lower suspicion  Patient's presentation is most consistent with acute presentation with potential threat to life or bodily function.  89 year old male presenting following a syncopal episode.  Bradycardic with improved blood pressure on presentation here.  Will obtain labs, EKG, provide home dose of midodrine  and continue to observe.  Patient with history of recurrent syncope due to orthostatic hypotension.  If workup here is  reassuring, may be appropriate for discharge with continued outpatient follow-up.  Labs without critical derangements.  Elevated bilirubin, but no abdominal pain and otherwise reassuring LFTs.  Creatinine at baseline.  On reevaluation, wife present at bedside.  Heart rate improved to the 70s, BP 135/68.  Patient with history of multiple similar episodes and clinical history less suggestive of cardiac related syncope.  I discussed this with wife who agreed that patient did not require readmission at this time.  She is comfortable with discharge home.  Strict return precautions provided.    FINAL CLINICAL IMPRESSION(S) / ED DIAGNOSES   Final diagnoses:  Syncope and collapse     Rx / DC Orders   ED Discharge Orders     None        Note:  This document was prepared using Dragon voice recognition software and may include unintentional dictation errors.   Levander Slate, MD 12/17/23 [phone]

## 2023-12-17 NOTE — ED Triage Notes (Signed)
 Pt arrived via EMS today for a syncopal episode. Pt was found to be on the toilet and was diaphoretic. Pt did have a BM per EMS. Pt is hypotensive normally and does take po meds for it. Pt is Alert at this time.

## 2023-12-18 ENCOUNTER — Telehealth: Payer: Self-pay

## 2023-12-18 NOTE — Transitions of Care (Post Inpatient/ED Visit) (Signed)
 12/18/2023  Name: Dylan [name] MRN: [id number] DOB: May 09, 1934  Today's TOC FU Call Status: Today's TOC FU Call Status:: Successful TOC FU Call Completed TOC FU Call Complete Date: 12/18/23 Patient's Name and Date of Birth confirmed.  Transition Care Management Follow-up Telephone Call Date of Discharge: 12/17/23 Discharge Facility[facility]) Type of Discharge: Emergency Department Reason for ED Visit: Other: (syncope) How have you been since you were released from the hospital?: Better Any questions or concerns?: No  Items Reviewed: Did you receive and understand the discharge instructions provided?: Yes Medications obtained,verified, and reconciled?: Yes (Medications Reviewed) Any new allergies since your discharge?: No Dietary orders reviewed?: Yes Do you have support at home?: Yes People in Home: spouse  Medications Reviewed Today: Medications Reviewed Today     Reviewed by Emmitt Pan, LPN (Licensed Practical Nurse) on 12/18/23 at 1352  Med List Status: <None>   Medication Order Taking? Sig Documenting Provider Last Dose Status Informant  acetaminophen  (TYLENOL ) 325 MG tablet [id number] No Take 2 tablets (650 mg total) by mouth every 6 (six) hours as needed for mild pain (or Fever >/= 101). Dorinda Drue DASEN, MD Taking Active   aspirin  EC 81 MG tablet [id number] No Take 1 tablet (81 mg total) by mouth daily. Swallow whole. Rilla Baller, MD Taking Active Spouse/Significant Other  atorvastatin  (LIPITOR) 20 MG tablet [id number]  Take 1 tablet by mouth once daily Rilla Baller, MD  Active   Cholecalciferol  (VITAMIN D3) 1000 units CAPS [id number] No Take 1 capsule (1,000 Units total) by mouth daily. Rilla Baller, MD Taking Active Spouse/Significant Other           Med Note JACKOLYN APOLINAR JONELLE   Thu Apr 25, 2022  2:56 PM)    fexofenadine  (ALLEGRA  ALLERGY) 180 MG tablet [id number] No Take 1 tablet (180 mg total) by mouth daily. Rilla Baller, MD Taking Active Spouse/Significant Other  finasteride  (PROSCAR ) 5 MG tablet [id number] No Take 1 tablet (5 mg total) by mouth daily. Rilla Baller, MD Taking Active   fludrocortisone  (FLORINEF ) 0.1 MG tablet [id number] No Take 2 tablets (0.2 mg total) by mouth daily. Leverne Charlies Helling, PA-C Taking Active   FLUoxetine  (PROZAC ) 20 MG capsule [id number] No Take 1 capsule (20 mg total) by mouth daily for 7 days, THEN 2 capsules (40 mg total) daily. Rilla Baller, MD Taking Expired 11/01/23 2359   guaiFENesin  (MUCINEX ) 600 MG 12 hr tablet [id number] No Take 1,200 mg by mouth daily. [provider] Taking Active Spouse/Significant Other  midodrine  (PROAMATINE ) 5 MG tablet [id number] No Take 1 tablet (5 mg total) by mouth 3 (three) times daily with meals. Rilla Baller, MD Taking Active   Polyethylene Glycol 3350  (MIRALAX  PO) [id number] No Take 17 g by mouth daily. [provider] Taking Active Spouse/Significant Other           Med Note JACKOLYN APOLINAR JONELLE [place] Apr 25, 2022  2:56 PM)    Probiotic Product (ALIGN PO) [id number] No Take by mouth daily. [provider] Taking Active Spouse/Significant Other  Sennosides (SENOKOT PO) [id number] No Take 1 tablet by mouth at bedtime. [provider] Taking Active Spouse/Significant Other           Med Note JACKOLYN APOLINAR JONELLE [place] Apr 25, 2022  2:56 PM)    vitamin B-12 (V-R VITAMIN B-12) 500 MCG tablet [id number] No Take 1 tablet (500 mcg total) by mouth daily.  Patient taking differently: Take 500 mcg by  mouth at bedtime.   Rilla Baller, MD Taking Active Spouse/Significant Other           Med Note JACKOLYN APOLINAR SAUNDERS   Thu Apr 25, 2022  2:56 PM)              Home Care and Equipment/Supplies: Were Home Health Services Ordered?: NA Any new equipment or medical supplies ordered?: NA  Functional Questionnaire: Do you need assistance with bathing/showering or dressing?: No Do you need assistance with  meal preparation?: No Do you need assistance with eating?: No Do you have difficulty maintaining continence: No Do you need assistance with getting out of bed/getting out of a chair/moving?: No Do you have difficulty managing or taking your medications?: No  Follow up appointments reviewed: PCP Follow-up appointment confirmed?: Yes Date of PCP follow-up appointment?: 12/23/23 Follow-up Provider: [facility] Follow-up appointment confirmed?: NA Do you need transportation to your follow-up appointment?: No Do you understand care options if your condition(s) worsen?: Yes-patient verbalized understanding    SIGNATURE Julian Lemmings, LPN [facility] Nurse Health Advisor Direct Dial [phone]

## 2023-12-23 ENCOUNTER — Encounter: Payer: Self-pay | Admitting: Family Medicine

## 2023-12-23 ENCOUNTER — Ambulatory Visit: Payer: Medicare Other | Admitting: Family Medicine

## 2023-12-23 VITALS — BP 126/70 | HR 50 | Temp 97.8°F | Ht 72.0 in | Wt 169.2 lb

## 2023-12-23 DIAGNOSIS — K5900 Constipation, unspecified: Secondary | ICD-10-CM

## 2023-12-23 DIAGNOSIS — I951 Orthostatic hypotension: Secondary | ICD-10-CM

## 2023-12-23 DIAGNOSIS — G301 Alzheimer's disease with late onset: Secondary | ICD-10-CM

## 2023-12-23 DIAGNOSIS — F02C18 Dementia in other diseases classified elsewhere, severe, with other behavioral disturbance: Secondary | ICD-10-CM

## 2023-12-23 DIAGNOSIS — R55 Syncope and collapse: Secondary | ICD-10-CM

## 2023-12-23 DIAGNOSIS — F66 Other sexual disorders: Secondary | ICD-10-CM

## 2023-12-23 MED ORDER — GUAIFENESIN ER 600 MG PO TB12: 600.0000 mg | ORAL_TABLET | Freq: Every day | ORAL | Status: DC

## 2023-12-23 MED ORDER — FLUOXETINE HCL 40 MG PO CAPS: 40.0000 mg | ORAL_CAPSULE | Freq: Every day | ORAL | 2 refills | Status: DC

## 2023-12-23 MED ORDER — SENNOSIDES 8.6 MG PO TABS: 1.0000 | ORAL_TABLET | Freq: Every day | ORAL | Status: DC | PRN

## 2023-12-23 NOTE — Progress Notes (Signed)
Ph: [phone] Fax: [phone]   Patient ID: Dylan [name], male    DOB: Feb 29, 1934, 89 y.o.   MRN: [id number]  This visit was conducted in person.  BP 126/70   Pulse (!) 50   Temp 97.8 F (36.6 C) (Temporal)   Ht 6' (1.829 m)   Wt 169 lb 4 oz (76.8 kg)   SpO2 98%   BMI 22.95 kg/m   BP Readings from Last 3 Encounters:  12/23/23 126/70  12/17/23 (!) 121/56  11/20/23 (!) 140/86    CC: ER f/u visit  Subjective:   HPI: Dylan [name] is a 89 y.o. male presenting on 12/23/2023 for Hospitalization Follow-up (Seen on 12/17/23 at [facility] ED, dx syncope/collapse. Pt accompanied by wife, Dylan [name]. )   Recent ER visit after syncope/collapse at home while getting hair washed by wife. ER records reviewed. He had not yet taken am midodrine dose. Hypocalcemia noted.   Noted to be bradycardic as well.   Ongoing urinary incontinence, some bowel incontinence.   Sees EP Dr Elberta Fortis, last seen 11/20/2023.  Amiodarone stopped due to syncope/orthostasis/bradycardia   Known chronic orthostatic hypotension complicated by syncope managed with fludrocortisone 0.2mg  daily along with midodrine 5mg  TID with meals. He continues abdominal binder, liberalized salt intake. Avoiding compression stockings due to h/o R popliteal PAD.  Baseline heart conduction disease, RBBB, LAD.  Advanced dementia complicates care.      Relevant past medical, surgical, family and social history reviewed and updated as indicated. Interim medical history since our last visit reviewed. Allergies and medications reviewed and updated. Outpatient Medications Prior to Visit  Medication Sig Dispense Refill   acetaminophen (TYLENOL) 325 MG tablet Take 2 tablets (650 mg total) by mouth every 6 (six) hours as needed for mild pain (or Fever >/= 101). 20 tablet 0   aspirin EC 81 MG tablet Take 1 tablet (81 mg total) by mouth daily. Swallow whole.     atorvastatin (LIPITOR) 20 MG tablet Take 1 tablet by mouth once daily 90 tablet 3    Cholecalciferol (VITAMIN D3) 1000 units CAPS Take 1 capsule (1,000 Units total) by mouth daily. 30 capsule    fexofenadine (ALLEGRA ALLERGY) 180 MG tablet Take 1 tablet (180 mg total) by mouth daily.     finasteride (PROSCAR) 5 MG tablet Take 1 tablet (5 mg total) by mouth daily. 90 tablet 4   fludrocortisone (FLORINEF) 0.1 MG tablet Take 2 tablets (0.2 mg total) by mouth daily. 180 tablet 3   midodrine (PROAMATINE) 5 MG tablet Take 1 tablet (5 mg total) by mouth 3 (three) times daily with meals. 90 tablet 6   Polyethylene Glycol 3350 (MIRALAX PO) Take 17 g by mouth daily.     Probiotic Product (ALIGN PO) Take by mouth daily.     vitamin B-12 (V-R VITAMIN B-12) 500 MCG tablet Take 1 tablet (500 mcg total) by mouth daily. (Patient taking differently: Take 500 mcg by mouth at bedtime.)     guaiFENesin (MUCINEX) 600 MG 12 hr tablet Take 1,200 mg by mouth daily.     FLUoxetine (PROZAC) 20 MG capsule Take 1 capsule (20 mg total) by mouth daily for 7 days, THEN 2 capsules (40 mg total) daily. 60 capsule 6   Sennosides (SENOKOT PO) Take 1 tablet by mouth at bedtime.     No facility-administered medications prior to visit.     Per HPI unless specifically indicated in ROS section below Review of Systems  Objective:  BP 126/70  Pulse (!) 50   Temp 97.8 F (36.6 C) (Temporal)   Ht 6' (1.829 m)   Wt 169 lb 4 oz (76.8 kg)   SpO2 98%   BMI 22.95 kg/m   Wt Readings from Last 3 Encounters:  12/23/23 169 lb 4 oz (76.8 kg)  12/17/23 165 lb (74.8 kg)  11/20/23 171 lb 12.8 oz (77.9 kg)      Physical Exam Vitals and nursing note reviewed.  Constitutional:      Appearance: Normal appearance.  HENT:     Mouth/Throat:     Mouth: Mucous membranes are moist.     Pharynx: Oropharynx is clear. No oropharyngeal exudate or posterior oropharyngeal erythema.  Eyes:     Extraocular Movements: Extraocular movements intact.     Conjunctiva/sclera: Conjunctivae normal.     Pupils: Pupils are equal, round,  and reactive to light.  Cardiovascular:     Rate and Rhythm: Regular rhythm. Bradycardia present.     Pulses: Normal pulses.     Heart sounds: Normal heart sounds. No murmur heard. Pulmonary:     Effort: Pulmonary effort is normal. No respiratory distress.     Breath sounds: Normal breath sounds. No wheezing, rhonchi or rales.  Musculoskeletal:     Right lower leg: No edema.     Left lower leg: No edema.  Skin:    General: Skin is warm and dry.     Findings: No rash.  Neurological:     Mental Status: He is alert.       Results for orders placed or performed during the hospital encounter of 12/17/23  Basic metabolic panel   Collection Time: 12/17/23  7:52 AM  Result Value Ref Range   Sodium 139 135 - 145 mmol/L   Potassium 3.8 3.5 - 5.1 mmol/L   Chloride 102 98 - 111 mmol/L   CO2 26 22 - 32 mmol/L   Glucose, Bld 141 (H) 70 - 99 mg/dL   BUN 15 8 - 23 mg/dL   Creatinine, Ser 1.61 (H) 0.61 - 1.24 mg/dL   Calcium 8.5 (L) 8.9 - 10.3 mg/dL   GFR, Estimated 53 (L) >60 mL/min   Anion gap 11 5 - 15  CBC   Collection Time: 12/17/23  7:52 AM  Result Value Ref Range   WBC 8.8 4.0 - 10.5 K/uL   RBC 4.65 4.22 - 5.81 MIL/uL   Hemoglobin 15.2 13.0 - 17.0 g/dL   HCT 09.6 04.5 - 40.9 %   MCV 92.7 80.0 - 100.0 fL   MCH 32.7 26.0 - 34.0 pg   MCHC 35.3 30.0 - 36.0 g/dL   RDW 81.1 91.4 - 78.2 %   Platelets 125 (L) 150 - 400 K/uL   nRBC 0.0 0.0 - 0.2 %  Magnesium   Collection Time: 12/17/23  7:52 AM  Result Value Ref Range   Magnesium 2.1 1.7 - 2.4 mg/dL  Hepatic function panel   Collection Time: 12/17/23  7:52 AM  Result Value Ref Range   Total Protein 6.6 6.5 - 8.1 g/dL   Albumin 3.6 3.5 - 5.0 g/dL   AST 34 15 - 41 U/L   ALT 18 0 - 44 U/L   Alkaline Phosphatase 83 38 - 126 U/L   Total Bilirubin 1.8 (H) 0.0 - 1.2 mg/dL   Bilirubin, Direct 0.4 (H) 0.0 - 0.2 mg/dL   Indirect Bilirubin 1.4 (H) 0.3 - 0.9 mg/dL  Troponin I (High Sensitivity)   Collection Time: 12/17/23  7:52 AM  Result Value Ref Range   Troponin I (High Sensitivity) 11 <18 ng/L    Assessment & Plan:   Problem List Items Addressed This Visit     Dementia (HCC) (Chronic)   bvFTD with Alz.  Advanced dementia with urinary incontinence complicates care.  Continues at [facility] adult memory daycare, wife is primary caregiver. Palliative care is involved.      Relevant Medications   FLUoxetine (PROZAC) 40 MG capsule   Recurrent syncope - Primary   Recurrent syncope in h/o same, latest episode occurred while wife was washing his hair early am after episode of urinary incontinence overnight.  He had not yet taken midodrine.  Continue current regimen.       Sexual disinhibition   Back on prozac 20mg  2 tablets daily. Will change dose to prozac 40mg  daily.       Constipation   Continues miralax daily in am with align probiotic in evenings. They've backed off sennakot due to episodes of overnight bowel incontinence       Orthostatic syncope   Hypocalcemia   Noted at ER.  Avoid calcium supplements in h/o PAD. Encourage increased dietary calcium intake.         Meds ordered this encounter  Medications   guaiFENesin (MUCINEX) 600 MG 12 hr tablet    Sig: Take 1 tablet (600 mg total) by mouth daily.   senna (SENOKOT) 8.6 MG tablet    Sig: Take 1 tablet (8.6 mg total) by mouth daily as needed.   FLUoxetine (PROZAC) 40 MG capsule    Sig: Take 1 capsule (40 mg total) by mouth daily.    Dispense:  90 capsule    Refill:  2    Note new dose    No orders of the defined types were placed in this encounter.   Patient Instructions  Increase calcium in the diet.  New prozac tablets will be 40mg  dose.  Good to see you today  Reschedule appt end of February to 2 months   Follow up plan: No follow-ups on file.  Eustaquio Boyden, MD

## 2023-12-23 NOTE — Assessment & Plan Note (Addendum)
Noted at ER.  Avoid calcium supplements in h/o PAD. Encourage increased dietary calcium intake.

## 2023-12-23 NOTE — Patient Instructions (Addendum)
Increase calcium in the diet.  New prozac tablets will be 40mg  dose.  Good to see you today  Reschedule appt end of February to 2 months

## 2023-12-23 NOTE — Assessment & Plan Note (Deleted)
Recurrent syncope in h/o same, latest episode occurred while wife was washing his hair early am after episode of urinary incontinence overnight.  He had not yet taken midodrine.  Continue current regimen.

## 2023-12-23 NOTE — Assessment & Plan Note (Addendum)
bvFTD with Alz.  Advanced dementia with urinary incontinence complicates care.  Continues at [facility] adult memory daycare, wife is primary caregiver. Palliative care is involved.

## 2023-12-23 NOTE — Assessment & Plan Note (Signed)
Back on prozac 20mg  2 tablets daily. Will change dose to prozac 40mg  daily.

## 2023-12-23 NOTE — Assessment & Plan Note (Signed)
Recurrent syncope in h/o same, latest episode occurred while wife was washing his hair early am after episode of urinary incontinence overnight.  He had not yet taken midodrine.  Continue current regimen.

## 2023-12-23 NOTE — Assessment & Plan Note (Addendum)
Continues miralax daily in am with align probiotic in evenings. They've backed off sennakot due to episodes of overnight bowel incontinence

## 2024-01-06 ENCOUNTER — Ambulatory Visit: Payer: Medicare Other | Admitting: Family Medicine

## 2024-02-24 ENCOUNTER — Encounter: Payer: Self-pay | Admitting: Family Medicine

## 2024-02-24 ENCOUNTER — Ambulatory Visit (INDEPENDENT_AMBULATORY_CARE_PROVIDER_SITE_OTHER): Payer: Medicare Other | Admitting: Family Medicine

## 2024-02-24 VITALS — BP 156/80 | HR 48 | Temp 97.8°F | Ht 72.0 in | Wt 172.2 lb

## 2024-02-24 DIAGNOSIS — G301 Alzheimer's disease with late onset: Secondary | ICD-10-CM | POA: Diagnosis not present

## 2024-02-24 DIAGNOSIS — I4892 Unspecified atrial flutter: Secondary | ICD-10-CM | POA: Diagnosis not present

## 2024-02-24 DIAGNOSIS — I951 Orthostatic hypotension: Secondary | ICD-10-CM | POA: Diagnosis not present

## 2024-02-24 DIAGNOSIS — R1310 Dysphagia, unspecified: Secondary | ICD-10-CM

## 2024-02-24 DIAGNOSIS — F02C18 Dementia in other diseases classified elsewhere, severe, with other behavioral disturbance: Secondary | ICD-10-CM

## 2024-02-24 NOTE — Patient Instructions (Addendum)
 Stop align - continue activia yogurt probiotic.  Trial off atorvastatin for 2-3 weeks to monitor effect on leg pain. If resolved, stay off atorvastatin. If no better, may restart.  Return in 3 months for follow up visit.

## 2024-02-24 NOTE — Progress Notes (Unsigned)
 Ph: [phone] Fax: [phone]   Patient ID: Dylan [name], male    DOB: 09/24/1934, 89 y.o.   MRN: [id number]  This visit was conducted in person.  BP (!) 156/80   Pulse (!) 48   Temp 97.8 F (36.6 C) (Oral)   Ht 6' (1.829 m)   Wt 172 lb 4 oz (78.1 kg)   SpO2 97%   BMI 23.36 kg/m   BP Readings from Last 3 Encounters:  02/24/24 (!) 156/80  12/23/23 126/70  12/17/23 (!) 121/56    CC: 2 mo f/u visit  Subjective:   HPI: Dylan [name] is a 89 y.o. male presenting on 02/24/2024 for Medical Management of Chronic Issues (Here for 2 mo f/u. Pt accompanied by wife, [name]. )   See prior note for details.  3d/wk to [facility] Continued BP fluctuations in orthostatic hypotension.  Continues fludrocortisone 0.1mg  2 tab daily as well as midodrine 5mg  TID.  Home BPs 120-130 systolic.   Aflutter - off AC due to fall risk.   Now on prozac 40mg  daily in am.  Noted L sided drooling, L hand stays clenched in fist, more trouble with pills.  Struggling with getting certain meds - align, midodrine, and prozac due to size of capsule.   R>L leg pain/itching.       Relevant past medical, surgical, family and social history reviewed and updated as indicated. Interim medical history since our last visit reviewed. Allergies and medications reviewed and updated. Outpatient Medications Prior to Visit  Medication Sig Dispense Refill  . acetaminophen (TYLENOL) 325 MG tablet Take 2 tablets (650 mg total) by mouth every 6 (six) hours as needed for mild pain (or Fever >/= 101). 20 tablet 0  . aspirin EC 81 MG tablet Take 1 tablet (81 mg total) by mouth daily. Swallow whole.    Marland Kitchen atorvastatin (LIPITOR) 20 MG tablet Take 1 tablet by mouth once daily 90 tablet 3  . Cholecalciferol (VITAMIN D3) 1000 units CAPS Take 1 capsule (1,000 Units total) by mouth daily. 30 capsule   . fexofenadine (ALLEGRA ALLERGY) 180 MG tablet Take 1 tablet (180 mg total) by mouth daily.    . finasteride (PROSCAR) 5  MG tablet Take 1 tablet (5 mg total) by mouth daily. 90 tablet 4  . fludrocortisone (FLORINEF) 0.1 MG tablet Take 2 tablets (0.2 mg total) by mouth daily. 180 tablet 3  . FLUoxetine (PROZAC) 40 MG capsule Take 1 capsule (40 mg total) by mouth daily. 90 capsule 2  . guaiFENesin (MUCINEX) 600 MG 12 hr tablet Take 1 tablet (600 mg total) by mouth daily.    . midodrine (PROAMATINE) 5 MG tablet Take 1 tablet (5 mg total) by mouth 3 (three) times daily with meals. 90 tablet 6  . Polyethylene Glycol 3350 (MIRALAX PO) Take 17 g by mouth daily.    . Probiotic Product (ALIGN PO) Take by mouth daily.    Marland Kitchen senna (SENOKOT) 8.6 MG tablet Take 1 tablet (8.6 mg total) by mouth daily as needed.    . vitamin B-12 (V-R VITAMIN B-12) 500 MCG tablet Take 1 tablet (500 mcg total) by mouth daily. (Patient taking differently: Take 500 mcg by mouth at bedtime.)     No facility-administered medications prior to visit.     Per HPI unless specifically indicated in ROS section below Review of Systems  Objective:  BP (!) 156/80   Pulse (!) 48   Temp 97.8 F (36.6 C) (Oral)   Ht  6' (1.829 m)   Wt 172 lb 4 oz (78.1 kg)   SpO2 97%   BMI 23.36 kg/m   Wt Readings from Last 3 Encounters:  02/24/24 172 lb 4 oz (78.1 kg)  12/23/23 169 lb 4 oz (76.8 kg)  12/17/23 165 lb (74.8 kg)      Physical Exam    Results for orders placed or performed during the hospital encounter of 12/17/23  Basic metabolic panel   Collection Time: 12/17/23  7:52 AM  Result Value Ref Range   Sodium 139 135 - 145 mmol/L   Potassium 3.8 3.5 - 5.1 mmol/L   Chloride 102 98 - 111 mmol/L   CO2 26 22 - 32 mmol/L   Glucose, Bld 141 (H) 70 - 99 mg/dL   BUN 15 8 - 23 mg/dL   Creatinine, Ser 3.08 (H) 0.61 - 1.24 mg/dL   Calcium 8.5 (L) 8.9 - 10.3 mg/dL   GFR, Estimated 53 (L) >60 mL/min   Anion gap 11 5 - 15  CBC   Collection Time: 12/17/23  7:52 AM  Result Value Ref Range   WBC 8.8 4.0 - 10.5 K/uL   RBC 4.65 4.22 - 5.81 MIL/uL    Hemoglobin 15.2 13.0 - 17.0 g/dL   HCT 65.7 84.6 - 96.2 %   MCV 92.7 80.0 - 100.0 fL   MCH 32.7 26.0 - 34.0 pg   MCHC 35.3 30.0 - 36.0 g/dL   RDW 95.2 84.1 - 32.4 %   Platelets 125 (L) 150 - 400 K/uL   nRBC 0.0 0.0 - 0.2 %  Magnesium   Collection Time: 12/17/23  7:52 AM  Result Value Ref Range   Magnesium 2.1 1.7 - 2.4 mg/dL  Hepatic function panel   Collection Time: 12/17/23  7:52 AM  Result Value Ref Range   Total Protein 6.6 6.5 - 8.1 g/dL   Albumin 3.6 3.5 - 5.0 g/dL   AST 34 15 - 41 U/L   ALT 18 0 - 44 U/L   Alkaline Phosphatase 83 38 - 126 U/L   Total Bilirubin 1.8 (H) 0.0 - 1.2 mg/dL   Bilirubin, Direct 0.4 (H) 0.0 - 0.2 mg/dL   Indirect Bilirubin 1.4 (H) 0.3 - 0.9 mg/dL  Troponin I (High Sensitivity)   Collection Time: 12/17/23  7:52 AM  Result Value Ref Range   Troponin I (High Sensitivity) 11 <18 ng/L    Assessment & Plan:   Problem List Items Addressed This Visit   None    No orders of the defined types were placed in this encounter.   No orders of the defined types were placed in this encounter.   There are no Patient Instructions on file for this visit.  Follow up plan: No follow-ups on file.  Claire Crick, MD

## 2024-02-25 ENCOUNTER — Encounter: Payer: Self-pay | Admitting: Family Medicine

## 2024-02-25 NOTE — Assessment & Plan Note (Signed)
 Regularly sees cardiology Off eliquis due to fall risk Amiodarone contributed to bradycardia - off this as well

## 2024-02-25 NOTE — Assessment & Plan Note (Addendum)
 bvFTD with alzheimer's.  Wife is caregiver.  Palliative care involved.

## 2024-02-25 NOTE — Assessment & Plan Note (Addendum)
 Continue current regimen to keep blood pressures up.  No recent falls. BP elevated today - no changes indicated.  Wife does a great job taking care of patient which is challenging due to dementia.

## 2024-02-25 NOTE — Assessment & Plan Note (Addendum)
 Notes difficulty with pills - stop align, transition to only activia yogurt. Consider further med tapering as needed.

## 2024-03-04 DIAGNOSIS — H10023 Other mucopurulent conjunctivitis, bilateral: Secondary | ICD-10-CM | POA: Diagnosis not present

## 2024-03-05 ENCOUNTER — Ambulatory Visit: Admitting: Family Medicine

## 2024-03-18 DIAGNOSIS — H52223 Regular astigmatism, bilateral: Secondary | ICD-10-CM | POA: Diagnosis not present

## 2024-03-18 DIAGNOSIS — H5212 Myopia, left eye: Secondary | ICD-10-CM | POA: Diagnosis not present

## 2024-03-18 DIAGNOSIS — Z9841 Cataract extraction status, right eye: Secondary | ICD-10-CM | POA: Diagnosis not present

## 2024-03-18 DIAGNOSIS — Z9842 Cataract extraction status, left eye: Secondary | ICD-10-CM | POA: Diagnosis not present

## 2024-04-17 ENCOUNTER — Emergency Department
Admission: EM | Admit: 2024-04-17 | Discharge: 2024-04-17 | Disposition: A | Discharge status: Home / Self Care | Attending: Emergency Medicine | Admitting: Emergency Medicine

## 2024-04-17 ENCOUNTER — Other Ambulatory Visit: Payer: Self-pay

## 2024-04-17 DIAGNOSIS — N189 Chronic kidney disease, unspecified: Secondary | ICD-10-CM | POA: Diagnosis not present

## 2024-04-17 DIAGNOSIS — I2510 Atherosclerotic heart disease of native coronary artery without angina pectoris: Secondary | ICD-10-CM | POA: Diagnosis not present

## 2024-04-17 DIAGNOSIS — F0390 Unspecified dementia without behavioral disturbance: Secondary | ICD-10-CM | POA: Insufficient documentation

## 2024-04-17 DIAGNOSIS — N390 Urinary tract infection, site not specified: Secondary | ICD-10-CM | POA: Diagnosis not present

## 2024-04-17 DIAGNOSIS — R300 Dysuria: Secondary | ICD-10-CM | POA: Diagnosis present

## 2024-04-17 LAB — CBC
HCT: 44.9 % (ref 39.0–52.0)
Hemoglobin: 15.1 g/dL (ref 13.0–17.0)
MCH: 31.9 pg (ref 26.0–34.0)
MCHC: 33.6 g/dL (ref 30.0–36.0)
MCV: 94.7 fL (ref 80.0–100.0)
Platelets: 145 10*3/uL — ABNORMAL LOW (ref 150–400)
RBC: 4.74 MIL/uL (ref 4.22–5.81)
RDW: 13.5 % (ref 11.5–15.5)
WBC: 8.3 10*3/uL (ref 4.0–10.5)
nRBC: 0 % (ref 0.0–0.2)

## 2024-04-17 LAB — BASIC METABOLIC PANEL WITH GFR
Anion gap: 10 (ref 5–15)
BUN: 13 mg/dL (ref 8–23)
CO2: 28 mmol/L (ref 22–32)
Calcium: 8.8 mg/dL — ABNORMAL LOW (ref 8.9–10.3)
Chloride: 101 mmol/L (ref 98–111)
Creatinine, Ser: 1.19 mg/dL (ref 0.61–1.24)
GFR, Estimated: 59 mL/min — ABNORMAL LOW (ref >60)
Glucose, Bld: 131 mg/dL — ABNORMAL HIGH (ref 70–99)
Potassium: 3.3 mmol/L — ABNORMAL LOW (ref 3.5–5.1)
Sodium: 139 mmol/L (ref 135–145)

## 2024-04-17 LAB — URINALYSIS, ROUTINE W REFLEX MICROSCOPIC
Bilirubin Urine: NEGATIVE
Glucose, UA: NEGATIVE mg/dL
Ketones, ur: NEGATIVE mg/dL
Leukocytes,Ua: NEGATIVE
Nitrite: NEGATIVE
Protein, ur: 30 mg/dL — AB
Specific Gravity, Urine: 1.013 (ref 1.005–1.030)
Squamous Epithelial / HPF: 0 /HPF (ref 0–5)
pH: 7 (ref 5.0–8.0)

## 2024-04-17 MED ORDER — POTASSIUM CHLORIDE CRYS ER 20 MEQ PO TBCR
20.0000 meq | EXTENDED_RELEASE_TABLET | Freq: Once | ORAL | Status: AC
  Administered 2024-04-17: 20 meq via ORAL
  Filled 2024-04-17: qty 1

## 2024-04-17 MED ORDER — CEPHALEXIN 500 MG PO CAPS: 500.0000 mg | ORAL_CAPSULE | Freq: Four times a day (QID) | ORAL | 0 refills | Status: DC

## 2024-04-17 NOTE — ED Triage Notes (Addendum)
 Pt to ED via POV from home. Wife reports pt lives at home and has advanced dementia. Wife states she believes he is having trouble urinating. Wife reports pt was pulling at his brief and yelling. Wife reports pt did urinate overnight in brief. Pt reports pain with urination. Pt A&Ox2.

## 2024-04-17 NOTE — ED Provider Notes (Signed)
 [facility] Provider Note    Event Date/Time   First MD Initiated Contact with Patient 04/17/24 1158     (approximate)   History   Chief Complaint Dysuria   HPI  Dylan [name] is a 89 y.o. male with past medical history of dementia, CAD, and PAD who presents to the ED complaining of dysuria.  Majority of history is obtained from patient's wife given advanced dementia.  She reports that overnight, patient seem to be intermittently agitated and complaining of pain to his groin area.  She states that he seemed to have significant pain whenever he tried to urinate, but she has not noticed any blood in his urine.  Patient currently denies any complaints and denies any pain when he urinates.  Wife is not aware of any fevers and states patient is at his baseline mental status.     Physical Exam   Triage Vital Signs: ED Triage Vitals [04/17/24 1148]  Encounter Vitals Group     BP 136/60     Systolic BP Percentile      Diastolic BP Percentile      Pulse Rate (!) 54     Resp 20     Temp 97.8 F (36.6 C)     Temp Source Oral     SpO2 95 %     Weight      Height      Head Circumference      Peak Flow      Pain Score      Pain Loc      Pain Education      Exclude from Growth Chart     Most recent vital signs: Vitals:   04/17/24 1148  BP: 136/60  Pulse: (!) 54  Resp: 20  Temp: 97.8 F (36.6 C)  SpO2: 95%    Constitutional: Alert and oriented. Eyes: Conjunctivae are normal. Head: Atraumatic. Nose: No congestion/rhinnorhea. Mouth/Throat: Mucous membranes are moist.  Cardiovascular: Normal rate, regular rhythm. Grossly normal heart sounds.  2+ radial pulses bilaterally. Respiratory: Normal respiratory effort.  No retractions. Lungs CTAB. Gastrointestinal: Soft and nontender. No distention. Genitourinary: No testicular tenderness or edema noted. Musculoskeletal: No lower extremity tenderness nor edema.  Neurologic:  Normal speech and language.  No gross focal neurologic deficits are appreciated.    ED Results / Procedures / Treatments   Labs (all labs ordered are listed, but only abnormal results are displayed) Labs Reviewed  CBC - Abnormal; Notable for the following components:      Result Value   Platelets 145 (*)    All other components within normal limits  BASIC METABOLIC PANEL WITH GFR - Abnormal; Notable for the following components:   Potassium 3.3 (*)    Glucose, Bld 131 (*)    Calcium  8.8 (*)    GFR, Estimated 59 (*)    All other components within normal limits  URINALYSIS, ROUTINE W REFLEX MICROSCOPIC - Abnormal; Notable for the following components:   Color, Urine YELLOW (*)    APPearance CLEAR (*)    Hgb urine dipstick SMALL (*)    Protein, ur 30 (*)    Bacteria, UA RARE (*)    All other components within normal limits  URINE CULTURE    PROCEDURES:  Critical Care performed: No  Procedures   MEDICATIONS ORDERED IN ED: Medications  potassium chloride SA (KLOR-CON M) CR tablet 20 mEq (has no administration in time range)     IMPRESSION / MDM / ASSESSMENT  AND PLAN / ED COURSE  I reviewed the triage vital signs and the nursing notes.                              89 y.o. male with past medical history of dementia, CKD, and PAD who presents to the ED complaining of intermittent pain when attempting to urinate as well as increased agitation overnight.  Patient's presentation is most consistent with acute presentation with potential threat to life or bodily function.  Differential diagnosis includes, but is not limited to, UTI, urinary retention, AKI, electrolyte abnormality.  Patient nontoxic-appearing and in no acute distress, vital signs are unremarkable.  He is alert and oriented to person and place, which is his baseline.  No abdominal tenderness noted, will check bladder scan for evidence of urinary retention.  Examination of his groin is unremarkable, labs without significant anemia,  leukocytosis, electrolyte abnormality, or AKI.  We will obtain urine sample via in/out catheterization.  Urine sample borderline for UTI, but will treat given his symptoms.  Urine was sent for culture and patient is pain-free on reassessment.  He is appropriate for outpatient management and has PCP follow-up scheduled for later this week.  Wife counseled to have him return to the ED for new or worsening symptoms, wife agrees with plan.      FINAL CLINICAL IMPRESSION(S) / ED DIAGNOSES   Final diagnoses:  Lower urinary tract infectious disease     Rx / DC Orders   ED Discharge Orders          Ordered    cephALEXin (KEFLEX) 500 MG capsule  4 times daily        04/17/24 1257             Note:  This document was prepared using Dragon voice recognition software and may include unintentional dictation errors.   [name], MD 04/17/24 1259

## 2024-04-17 NOTE — ED Notes (Signed)
Bladder scan 65 ml

## 2024-04-19 LAB — URINE CULTURE: Culture: NO GROWTH

## 2024-04-21 ENCOUNTER — Encounter: Payer: Self-pay | Admitting: Family Medicine

## 2024-04-21 ENCOUNTER — Ambulatory Visit: Admitting: Family Medicine

## 2024-04-21 VITALS — BP 134/64 | HR 62 | Temp 98.0°F | Ht 72.0 in

## 2024-04-21 DIAGNOSIS — F02C11 Dementia in other diseases classified elsewhere, severe, with agitation: Secondary | ICD-10-CM

## 2024-04-21 DIAGNOSIS — R159 Full incontinence of feces: Secondary | ICD-10-CM

## 2024-04-21 DIAGNOSIS — R2681 Unsteadiness on feet: Secondary | ICD-10-CM | POA: Diagnosis not present

## 2024-04-21 DIAGNOSIS — R152 Fecal urgency: Secondary | ICD-10-CM

## 2024-04-21 DIAGNOSIS — L89302 Pressure ulcer of unspecified buttock, stage 2: Secondary | ICD-10-CM | POA: Diagnosis not present

## 2024-04-21 DIAGNOSIS — E785 Hyperlipidemia, unspecified: Secondary | ICD-10-CM

## 2024-04-21 DIAGNOSIS — I951 Orthostatic hypotension: Secondary | ICD-10-CM

## 2024-04-21 DIAGNOSIS — G301 Alzheimer's disease with late onset: Secondary | ICD-10-CM | POA: Diagnosis not present

## 2024-04-21 NOTE — Patient Instructions (Addendum)
 Urine culture returned ok - may stop antibiotics.  Continue regular barrier cream use for rash to bottom. Frequent position changes, keep area as clean/dry as able. Encourage protein intake to speed healing.  Possible stroke contributing to L hand drawing and leaning to left with walking. Restart atorvastatin , use as much as is tolerated without leg pain. Continue aspirin  81mg  daily.  Keep next month's appointment

## 2024-04-21 NOTE — Progress Notes (Signed)
 Ph: [phone] Fax: [phone]   Patient ID: Fletcher Humble, male    DOB: December 02, 1933, 89 y.o.   MRN: [id number]  This visit was conducted in person.  BP 134/64   Pulse 62   Temp 98 F (36.7 C) (Oral)   Ht 6' (1.829 m)   SpO2 95%   BMI 23.36 kg/m    CC: dementia  Subjective:   HPI: JAIVION KINGSLEY is a 89 y.o. male presenting on 04/21/2024 for Medical Management of Chronic Issues (Here for changes in dementia. Pt accompanied by wife, [name]. )   Seen over weekend at ER for worsening overnight agitation and groin pain with urination - labs showed K 3.3 (repleted), Ca 8.8, Cr 1.19, plt 145 o/w WNL. Treated presumptively for UTI with keflex QID course. UA showed 30 protein, small Hgb with 6-10 RBC/hpf (this was a catheterized specimen collection), UCx returned no growth.   Sunday morning wife noticed he has a raw rash to bottom - treated at home with cortizone and desitin, then triple paste barrier cream with zinc .   Increased loose stools with incontinence that started Friday. Activia was stopped 1-2 wks ago.  Miralax , senna currently on hold.  Thought advanced bvFTD with alzheimer's.  3d/wk to [facility] adult daycare program.  Continues prozac  40mg  daily.   Continued BP fluctuations in h/o orthostatic hypotension.  Continues fludrocortisone  0.2mg  daily with midodrine  5mg  TID with meals.   Caregiver has noticed that he's walking crooked and is keeping left hand closed.   Off atorvastatin  - may have caused leg discomfort.  Now more consistently using transport chair due to unsteadiness.      Relevant past medical, surgical, family and social history reviewed and updated as indicated. Interim medical history since our last visit reviewed. Allergies and medications reviewed and updated. Outpatient Medications Prior to Visit  Medication Sig Dispense Refill   acetaminophen  (TYLENOL ) 325 MG tablet Take 2 tablets (650 mg total) by mouth every 6 (six) hours as needed for  mild pain (or Fever >/= 101). 20 tablet 0   aspirin  EC 81 MG tablet Take 1 tablet (81 mg total) by mouth daily. Swallow whole.     Cholecalciferol  (VITAMIN D3) 1000 units CAPS Take 1 capsule (1,000 Units total) by mouth daily. 30 capsule    fexofenadine  (ALLEGRA  ALLERGY) 180 MG tablet Take 1 tablet (180 mg total) by mouth daily.     finasteride  (PROSCAR ) 5 MG tablet Take 1 tablet (5 mg total) by mouth daily. 90 tablet 4   fludrocortisone  (FLORINEF ) 0.1 MG tablet Take 2 tablets (0.2 mg total) by mouth daily. 180 tablet 3   FLUoxetine  (PROZAC ) 40 MG capsule Take 1 capsule (40 mg total) by mouth daily. 90 capsule 2   guaiFENesin  (MUCINEX ) 600 MG 12 hr tablet Take 1 tablet (600 mg total) by mouth daily.     midodrine  (PROAMATINE ) 5 MG tablet Take 1 tablet (5 mg total) by mouth 3 (three) times daily with meals. 90 tablet 6   Polyethylene Glycol 3350  (MIRALAX  PO) Take 17 g by mouth daily. As needed     senna (SENOKOT) 8.6 MG tablet Take 1 tablet (8.6 mg total) by mouth daily as needed.     vitamin B-12 (V-R VITAMIN B-12) 500 MCG tablet Take 1 tablet (500 mcg total) by mouth daily. (Patient taking differently: Take 500 mcg by mouth at bedtime.)     cephALEXin (KEFLEX) 500 MG capsule Take 1 capsule (500 mg total) by mouth 4 (four)  times daily for 5 days. 20 capsule 0   atorvastatin  (LIPITOR) 20 MG tablet Take 1 tablet (20 mg total) by mouth daily.     atorvastatin  (LIPITOR) 20 MG tablet Take 1 tablet by mouth once daily (Patient not taking: Reported on 04/21/2024) 90 tablet 3   No facility-administered medications prior to visit.     Per HPI unless specifically indicated in ROS section below Review of Systems  Objective:  BP 134/64   Pulse 62   Temp 98 F (36.7 C) (Oral)   Ht 6' (1.829 m)   SpO2 95%   BMI 23.36 kg/m   Wt Readings from Last 3 Encounters:  02/24/24 172 lb 4 oz (78.1 kg)  12/23/23 169 lb 4 oz (76.8 kg)  12/17/23 165 lb (74.8 kg)      Physical Exam Vitals and nursing note  reviewed.  Constitutional:      Appearance: Normal appearance. He is not ill-appearing.     Comments: Using transport chair   Eyes:     Extraocular Movements: Extraocular movements intact.     Pupils: Pupils are equal, round, and reactive to light.    Cardiovascular:     Rate and Rhythm: Normal rate and regular rhythm.     Pulses: Normal pulses.     Heart sounds: Normal heart sounds. No murmur heard. Pulmonary:     Effort: Pulmonary effort is normal. No respiratory distress.     Breath sounds: Normal breath sounds. No wheezing, rhonchi or rales.   Musculoskeletal:     Right lower leg: No edema.     Left lower leg: No edema.   Skin:    General: Skin is warm and dry.   Neurological:     Mental Status: He is alert.     Comments:  Somnolent today - poor night sleep last night 5/5 strength BLE Sitting in transport chair L hand favors flexion contraction position, able to passively open       Results for orders placed or performed during the hospital encounter of 04/17/24  CBC   Collection Time: 04/17/24 11:50 AM  Result Value Ref Range   WBC 8.3 4.0 - 10.5 K/uL   RBC 4.74 4.22 - 5.81 MIL/uL   Hemoglobin 15.1 13.0 - 17.0 g/dL   HCT 81.1 91.4 - 78.2 %   MCV 94.7 80.0 - 100.0 fL   MCH 31.9 26.0 - 34.0 pg   MCHC 33.6 30.0 - 36.0 g/dL   RDW 95.6 21.3 - 08.6 %   Platelets 145 (L) 150 - 400 K/uL   nRBC 0.0 0.0 - 0.2 %  Basic metabolic panel   Collection Time: 04/17/24 11:50 AM  Result Value Ref Range   Sodium 139 135 - 145 mmol/L   Potassium 3.3 (L) 3.5 - 5.1 mmol/L   Chloride 101 98 - 111 mmol/L   CO2 28 22 - 32 mmol/L   Glucose, Bld 131 (H) 70 - 99 mg/dL   BUN 13 8 - 23 mg/dL   Creatinine, Ser 5.78 0.61 - 1.24 mg/dL   Calcium  8.8 (L) 8.9 - 10.3 mg/dL   GFR, Estimated 59 (L) >60 mL/min   Anion gap 10 5 - 15  Urine Culture   Collection Time: 04/17/24 12:42 PM   Specimen: Urine, Clean Catch  Result Value Ref Range   Specimen Description      URINE, CLEAN  CATCH Performed at [facility], [address] [place], [place] [address]    Special Requests  NONE Performed at [facility], [address], [place], [place] [address]    Culture      NO GROWTH Performed at [facility], [address] [place], [place] [address]    Report Status 04/19/2024 FINAL   Urinalysis, Routine w reflex microscopic -Urine, Clean Catch   Collection Time: 04/17/24 12:42 PM  Result Value Ref Range   Color, Urine YELLOW (A) YELLOW   APPearance CLEAR (A) CLEAR   Specific Gravity, Urine 1.013 1.005 - 1.030   pH 7.0 5.0 - 8.0   Glucose, UA NEGATIVE NEGATIVE mg/dL   Hgb urine dipstick SMALL (A) NEGATIVE   Bilirubin Urine NEGATIVE NEGATIVE   Ketones, ur NEGATIVE NEGATIVE mg/dL   Protein, ur 30 (A) NEGATIVE mg/dL   Nitrite NEGATIVE NEGATIVE   Leukocytes,Ua NEGATIVE NEGATIVE   RBC / HPF 6-10 0 - 5 RBC/hpf   WBC, UA 0-5 0 - 5 WBC/hpf   Bacteria, UA RARE (A) NONE SEEN   Squamous Epithelial / HPF 0 0 - 5 /HPF   Mucus PRESENT     Assessment & Plan:   Problem List Items Addressed This Visit     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  Found to have macerated rash/pressure sore to buttock in setting of worsened loose stools - anticipate this contributes to recent symptoms. See below.       Dyslipidemia   Atorvastatin  was stopped due to possible contribution to leg pain  Restart atorvastatin  as per above.       Relevant Medications   atorvastatin  (LIPITOR) 20 MG tablet   Orthostatic hypotension   Ongoing difficulty - continue fludrocortisone  0.2mg  daily with midodrine  5mg  TID       Relevant Medications   atorvastatin  (LIPITOR) 20 MG tablet   Pressure sore on buttocks   Loose stools contribute to this.  Discussed skin care - rec continue barrier cream use. Discussed  frequent position changes.  Update if not improving with treatment.       Bowel incontinence   New - discussed possible change in level of care with bowel incontinence.  H/o constipation, miralax  and sennakot currently on hold.       General unsteadiness   Acute worsening, with L hand contracting - discussed possibly due to stroke.  Offered head CT - wife declines. Continue aspirin , BP remains well controlled.  Statin had been stopped - will restart atorvastatin  20mg  as much as can be tolerated.         No orders of the defined types were placed in this encounter.   No orders of the defined types were placed in this encounter.   Patient Instructions  Urine culture returned ok - may stop antibiotics.  Continue regular barrier cream use for rash to bottom. Frequent position changes, keep area as clean/dry as able. Encourage protein intake to speed healing.  Possible stroke contributing to L hand drawing and leaning to left with walking. Restart atorvastatin , use as much as is tolerated without leg pain. Continue aspirin  81mg  daily.  Keep next month's appointment  Follow up plan: Return if symptoms worsen or fail to improve.  Claire Crick, MD

## 2024-04-22 DIAGNOSIS — R159 Full incontinence of feces: Secondary | ICD-10-CM | POA: Insufficient documentation

## 2024-04-22 DIAGNOSIS — R2681 Unsteadiness on feet: Secondary | ICD-10-CM | POA: Insufficient documentation

## 2024-04-22 NOTE — Assessment & Plan Note (Signed)
 Loose stools contribute to this.  Discussed skin care - rec continue barrier cream use. Discussed frequent position changes.  Update if not improving with treatment.

## 2024-04-22 NOTE — Assessment & Plan Note (Signed)
 Atorvastatin  was stopped due to possible contribution to leg pain  Restart atorvastatin  as per above.

## 2024-04-22 NOTE — Assessment & Plan Note (Signed)
 Presumed bvFTD with alzheimer's Initially thought UTI contributing to agitation/groin pain - however UCx didn't grow infection - stop antibiotic.  Found to have macerated rash/pressure sore to buttock in setting of worsened loose stools - anticipate this contributes to recent symptoms. See below.

## 2024-04-22 NOTE — Assessment & Plan Note (Signed)
 Ongoing difficulty - continue fludrocortisone  0.2mg  daily with midodrine  5mg  TID

## 2024-04-22 NOTE — Assessment & Plan Note (Addendum)
 New - discussed possible change in level of care with bowel incontinence.  H/o constipation, miralax  and sennakot currently on hold.

## 2024-04-22 NOTE — Assessment & Plan Note (Signed)
 Acute worsening, with L hand contracting - discussed possibly due to stroke.  Offered head CT - wife declines. Continue aspirin , BP remains well controlled.  Statin had been stopped - will restart atorvastatin  20mg  as much as can be tolerated.

## 2024-04-25 ENCOUNTER — Emergency Department

## 2024-04-25 ENCOUNTER — Other Ambulatory Visit: Payer: Self-pay

## 2024-04-25 ENCOUNTER — Encounter: Payer: Self-pay | Admitting: Emergency Medicine

## 2024-04-25 ENCOUNTER — Emergency Department: Admission: EM | Admit: 2024-04-25 | Discharge: 2024-04-25 | Disposition: A | Discharge status: Home / Self Care

## 2024-04-25 DIAGNOSIS — Z743 Need for continuous supervision: Secondary | ICD-10-CM | POA: Diagnosis not present

## 2024-04-25 DIAGNOSIS — F0390 Unspecified dementia without behavioral disturbance: Secondary | ICD-10-CM | POA: Diagnosis not present

## 2024-04-25 DIAGNOSIS — I6523 Occlusion and stenosis of bilateral carotid arteries: Secondary | ICD-10-CM | POA: Diagnosis not present

## 2024-04-25 DIAGNOSIS — I959 Hypotension, unspecified: Secondary | ICD-10-CM | POA: Diagnosis not present

## 2024-04-25 DIAGNOSIS — R0689 Other abnormalities of breathing: Secondary | ICD-10-CM | POA: Diagnosis not present

## 2024-04-25 DIAGNOSIS — M79602 Pain in left arm: Secondary | ICD-10-CM | POA: Insufficient documentation

## 2024-04-25 DIAGNOSIS — N189 Chronic kidney disease, unspecified: Secondary | ICD-10-CM | POA: Insufficient documentation

## 2024-04-25 DIAGNOSIS — E876 Hypokalemia: Secondary | ICD-10-CM | POA: Diagnosis not present

## 2024-04-25 DIAGNOSIS — R9082 White matter disease, unspecified: Secondary | ICD-10-CM | POA: Diagnosis not present

## 2024-04-25 LAB — COMPREHENSIVE METABOLIC PANEL WITH GFR
ALT: 16 U/L (ref 0–44)
AST: 27 U/L (ref 15–41)
Albumin: 3.5 g/dL (ref 3.5–5.0)
Alkaline Phosphatase: 67 U/L (ref 38–126)
Anion gap: 9 (ref 5–15)
BUN: 14 mg/dL (ref 8–23)
CO2: 28 mmol/L (ref 22–32)
Calcium: 8.4 mg/dL — ABNORMAL LOW (ref 8.9–10.3)
Chloride: 103 mmol/L (ref 98–111)
Creatinine, Ser: 1.14 mg/dL (ref 0.61–1.24)
GFR, Estimated: >60 mL/min (ref >60)
Glucose, Bld: 109 mg/dL — ABNORMAL HIGH (ref 70–99)
Potassium: 2.7 mmol/L — CL (ref 3.5–5.1)
Sodium: 140 mmol/L (ref 135–145)
Total Bilirubin: 1.3 mg/dL — ABNORMAL HIGH (ref 0.0–1.2)
Total Protein: 6.3 g/dL — ABNORMAL LOW (ref 6.5–8.1)

## 2024-04-25 LAB — CBC WITH DIFFERENTIAL/PLATELET
Abs Immature Granulocytes: 0.02 10*3/uL (ref 0.00–0.07)
Basophils Absolute: 0.1 10*3/uL (ref 0.0–0.1)
Basophils Relative: 1 %
Eosinophils Absolute: 0.1 10*3/uL (ref 0.0–0.5)
Eosinophils Relative: 1 %
HCT: 40.3 % (ref 39.0–52.0)
Hemoglobin: 14.3 g/dL (ref 13.0–17.0)
Immature Granulocytes: 0 %
Lymphocytes Relative: 8 %
Lymphs Abs: 0.8 10*3/uL (ref 0.7–4.0)
MCH: 32 pg (ref 26.0–34.0)
MCHC: 35.5 g/dL (ref 30.0–36.0)
MCV: 90.2 fL (ref 80.0–100.0)
Monocytes Absolute: 0.8 10*3/uL (ref 0.1–1.0)
Monocytes Relative: 9 %
Neutro Abs: 7.2 10*3/uL (ref 1.7–7.7)
Neutrophils Relative %: 81 %
Platelets: 131 10*3/uL — ABNORMAL LOW (ref 150–400)
RBC: 4.47 MIL/uL (ref 4.22–5.81)
RDW: 13.2 % (ref 11.5–15.5)
WBC: 8.9 10*3/uL (ref 4.0–10.5)
nRBC: 0 % (ref 0.0–0.2)

## 2024-04-25 LAB — TROPONIN I (HIGH SENSITIVITY)
Troponin I (High Sensitivity): 16 ng/L (ref <18)
Troponin I (High Sensitivity): 18 ng/L — ABNORMAL HIGH (ref <18)

## 2024-04-25 MED ORDER — POTASSIUM CHLORIDE 10 MEQ/100ML IV SOLN
10.0000 meq | Freq: Once | INTRAVENOUS | Status: AC
  Administered 2024-04-25: 10 meq via INTRAVENOUS
  Filled 2024-04-25: qty 100

## 2024-04-25 MED ORDER — POTASSIUM CHLORIDE CRYS ER 20 MEQ PO TBCR: 20.0000 meq | EXTENDED_RELEASE_TABLET | Freq: Every day | ORAL | 0 refills | Status: DC

## 2024-04-25 MED ORDER — POTASSIUM CHLORIDE 20 MEQ PO PACK
40.0000 meq | PACK | Freq: Two times a day (BID) | ORAL | Status: DC
  Administered 2024-04-25: 40 meq via ORAL
  Filled 2024-04-25: qty 2

## 2024-04-25 MED ORDER — MAGNESIUM SULFATE 2 GM/50ML IV SOLN
2.0000 g | Freq: Once | INTRAVENOUS | Status: AC
  Administered 2024-04-25: 2 g via INTRAVENOUS
  Filled 2024-04-25: qty 50

## 2024-04-25 NOTE — ED Provider Notes (Signed)
 [facility] Provider Note    Event Date/Time   First MD Initiated Contact with Patient 04/25/24 1007     (approximate)   History   Dysuria, Arm Pain, and Leg Pain  Pt via [facility] from home. Pt lives with his wife. Per wife, pt called out for L arm and L leg pain this morning. Wife said on arrival that his symptoms for the UTI resolved and was told to stop his abx on Wedneday because there was any sign of infection per their PCP. Wife says they are here for the L arm and L leg. Denies falls or injury. Wife reports a syncopal episode. Pt is alert but disoriented reports pt has a hx of dementia and wife said he is more confused than normal.   EMS reports 99.5 oral, 149/70 BP. 56 HR, 23 ETCO2    HPI Dylan [name] is a 89 y.o. male PMH dementia, CKD, dyslipidemia, orthostatic hypotension, DNR status presents for evaluation of possible left-sided arm and leg pain and possible AMS - Patient is a limited historian, not able to provide clear history as to events.  Baseline severe dementia.  Collateral gathered from wife who states the patient had 2 episodes remaining and complaining of left arm and leg pain.  Notes that over the past several months his overall behavior his baseline confusion and behavior has been worsening. - Patient currently has no complaints  Per chart review, last seen in clinic by primary provider on 04/21/2024.  Noted to have worsening with contractions of left hand, thought to possibly have had underlying stroke, offered CT head which was declined.  Continued aspirin , restarted atorvastatin .  Recently seen in our emergency department on 04/17/2024.  Treated for presumed UTI with Keflex .     Physical Exam   Triage Vital Signs: ED Triage Vitals  Encounter Vitals Group     BP 04/25/24 1016 (!) 154/84     Girls Systolic BP Percentile --      Girls Diastolic BP Percentile --      Boys Systolic BP Percentile --      Boys Diastolic BP Percentile --       Pulse Rate 04/25/24 1016 67     Resp 04/25/24 1016 (!) 24     Temp 04/25/24 1016 98.8 F (37.1 C)     Temp Source 04/25/24 1016 Oral     SpO2 04/25/24 1016 94 %     Weight 04/25/24 1009 165 lb (74.8 kg)     Height 04/25/24 1009 6' (1.829 m)     Head Circumference --      Peak Flow --      Pain Score --      Pain Loc --      Pain Education --      Exclude from Growth Chart --     Most recent vital signs: Vitals:   04/25/24 1403 04/25/24 1457  BP: (!) 162/99 (!) 175/83  Pulse:  75  Resp: 18 20  Temp:  98 F (36.7 C)  SpO2:  97%     General: Awake, no distress.  CV:  Good peripheral perfusion. RRR, RP 2+ Resp:  Normal effort. CTAB Abd:  No distention. Nontender to deep palpation throughout Neuro:  Aox1, face symmetric, BUE AG 10+ sec no drift, BLE AG 5+ sec no drift, limited interaction with neuroexam though no focal motor deficit appreciated Other: +contracture of L hand, no apparent tenderness   ED Results / Procedures /  Treatments   Labs (all labs ordered are listed, but only abnormal results are displayed) Labs Reviewed  COMPREHENSIVE METABOLIC PANEL WITH GFR - Abnormal; Notable for the following components:      Result Value   Potassium 2.7 (*)    Glucose, Bld 109 (*)    Calcium  8.4 (*)    Total Protein 6.3 (*)    Total Bilirubin 1.3 (*)    All other components within normal limits  CBC WITH DIFFERENTIAL/PLATELET - Abnormal; Notable for the following components:   Platelets 131 (*)    All other components within normal limits  TROPONIN I (HIGH SENSITIVITY) - Abnormal; Notable for the following components:   Troponin I (High Sensitivity) 18 (*)    All other components within normal limits  TROPONIN I (HIGH SENSITIVITY)     EKG  Ecg = sinus rhythm, rate 54, no gross ST elevation or depression, no significant repolarization abnormality, right bundle branch block present.  Left axis deviation.  No clear evidence of ischemia nor arrhythmia on my  interpretation.   RADIOLOGY Radiology interpreted by myself and radiology report reviewed.  No acute pathology.    PROCEDURES:  Critical Care performed: No  Procedures   MEDICATIONS ORDERED IN ED: Medications  potassium chloride  (KLOR-CON ) packet 40 mEq (40 mEq Oral Given 04/25/24 1126)  potassium chloride  10 mEq in 100 mL IVPB (0 mEq Intravenous Stopped 04/25/24 1419)  magnesium  sulfate IVPB 2 g 50 mL (0 g Intravenous Stopped 04/25/24 1419)     IMPRESSION / MDM / ASSESSMENT AND PLAN / ED COURSE  I reviewed the triage vital signs and the nursing notes.                              DDX/MDM/AP: Differential diagnosis includes, but is not limited to, unlikely CVA or ACS, consider underlying electrolyte abnormality, consider worsening of baseline dementia.  Will screen for underlying UTI.  No clear evidence of pathology on my initial eval and patient currently asymptomatic though given underlying severe dementia will pursue broad workup.  Plan: - Labs - EKG - CT head - Reassess  Patient's presentation is most consistent with acute presentation with potential threat to life or bodily function.  The patient is on the cardiac monitor to evaluate for evidence of arrhythmia and/or significant heart rate changes.  ED course below.  Workup unremarkable, serial troponin stable.  Patient did have hypokalemia to 2.7, repleted p.o. and IV and given empiric magnesium  repletion.  Discharged with 2 more doses of 20 mEq potassium over the next 2 days, plan for PMD follow-up for recheck.  No evidence of acute pathology today.  ED return precautions in place.  Patient's wife agrees with plan.  Clinical Course as of 04/25/24 1536  Sun Apr 25, 2024  1100 Cbc wnl [MM]  1104 CMP with new hypokalemia to 2.7, will replete p.o. and IV as well as replete magnesium  empirically. [MM]  1301 WUX:[id number]: 1. No acute intracranial abnormality. 2. Moderate generalized atrophy and white matter disease,  similar to the prior study.   [MM]  1335 Patient reevaluated, wife reassured by unremarkable workup  Discussed that hypokalemia may be contributing to some of the muscle spasms patient appears to be having  Medications reviewed, no obvious culprit medications.  Will replete and recommend have rechecked in the outpatient setting.  Denies any recent UTI symptoms and states patient is at his baseline mental state, no indication to escalate for  catheterization for urine though will however get repeat troponin as episode of left arm pain occurred shortly prior to arrival.  Initial troponin normal. [MM]  1440 Repeat Trop essentially unchanged, stable from prior, not suspect underlying cardiac etiology [MM]    Clinical Course User Index [MM] Collis Deaner, MD     FINAL CLINICAL IMPRESSION(S) / ED DIAGNOSES   Final diagnoses:  Left arm pain  Dementia, unspecified dementia severity, unspecified dementia type, unspecified whether behavioral, psychotic, or mood disturbance or anxiety (HCC)  Hypokalemia     Rx / DC Orders   ED Discharge Orders          Ordered    potassium chloride  SA (KLOR-CON  M) 20 MEQ tablet  Daily        04/25/24 1443             Note:  This document was prepared using Dragon voice recognition software and may include unintentional dictation errors.   Collis Deaner, MD 04/25/24 1536

## 2024-04-25 NOTE — ED Notes (Signed)
 Called lab to request add-on troponin as ordered

## 2024-04-25 NOTE — ED Triage Notes (Addendum)
 Pt via [facility] from home. Pt lives with his wife. Per wife, pt called out for L arm and L leg pain this morning. Wife said on arrival that his symptoms for the UTI resolved and was told to stop his abx on Wedneday because there was any sign of infection per their PCP. Wife says they are here for the L arm and L leg. Denies falls or injury. Wife reports a syncopal episode. Pt is alert but disoriented reports pt has a hx of dementia and wife said he is more confused than normal.   EMS reports 99.5 oral, 149/70 BP. 56 HR, 23 ETCO2

## 2024-04-25 NOTE — ED Notes (Signed)
 Patient transported to CT

## 2024-04-25 NOTE — Discharge Instructions (Addendum)
 Your husband's evaluation in the emergency department was overall reassuring, and we saw no concerning findings today.  I am unsure as to why he had an episode of left arm pain.  Please do follow-up with his primary care provider for reevaluation, and return to the emergency department with any new or worsening symptoms.  His potassium was low today, and this was repleted here in the emergency department.  I have provided 2 additional doses to take tomorrow and the following day.  Please have potassium rechecked by his primary care provider.

## 2024-04-30 ENCOUNTER — Encounter: Payer: Self-pay | Admitting: Family Medicine

## 2024-04-30 ENCOUNTER — Ambulatory Visit (INDEPENDENT_AMBULATORY_CARE_PROVIDER_SITE_OTHER): Admitting: Family Medicine

## 2024-04-30 VITALS — BP 134/82 | HR 64 | Temp 98.0°F | Ht 72.0 in

## 2024-04-30 DIAGNOSIS — R21 Rash and other nonspecific skin eruption: Secondary | ICD-10-CM | POA: Diagnosis not present

## 2024-04-30 DIAGNOSIS — F66 Other sexual disorders: Secondary | ICD-10-CM

## 2024-04-30 DIAGNOSIS — E876 Hypokalemia: Secondary | ICD-10-CM | POA: Diagnosis not present

## 2024-04-30 DIAGNOSIS — I951 Orthostatic hypotension: Secondary | ICD-10-CM | POA: Diagnosis not present

## 2024-04-30 DIAGNOSIS — Z515 Encounter for palliative care: Secondary | ICD-10-CM | POA: Diagnosis not present

## 2024-04-30 DIAGNOSIS — G301 Alzheimer's disease with late onset: Secondary | ICD-10-CM

## 2024-04-30 DIAGNOSIS — R159 Full incontinence of feces: Secondary | ICD-10-CM

## 2024-04-30 DIAGNOSIS — R152 Fecal urgency: Secondary | ICD-10-CM

## 2024-04-30 DIAGNOSIS — F02C11 Dementia in other diseases classified elsewhere, severe, with agitation: Secondary | ICD-10-CM

## 2024-04-30 MED ORDER — NYSTATIN 100000 UNIT/GM EX CREA: 1.0000 | TOPICAL_CREAM | Freq: Two times a day (BID) | CUTANEOUS | 1 refills | Status: DC

## 2024-04-30 MED ORDER — POTASSIUM CHLORIDE ER 10 MEQ PO CPCR: 10.0000 meq | ORAL_CAPSULE | Freq: Every day | ORAL | 0 refills | Status: DC

## 2024-04-30 NOTE — Progress Notes (Signed)
 Ph: [phone] Fax: [phone]   Patient ID: Dylan [name], male    DOB: 10-Mar-1934, 89 y.o.   MRN: [id number]  This visit was conducted in person.  BP 134/82   Pulse 64   Temp 98 F (36.7 C) (Oral)   Ht 6' (1.829 m)   SpO2 97%   BMI 22.38 kg/m    CC: ER f/u visit  Subjective:   HPI: Dylan [name] is a 89 y.o. male presenting on 04/30/2024 for Hospitalization Follow-up (Pt seen on 04/25/24 at [facility] ED, dx L arm pain; dementia; hypokalemia. Also, c/o rash on buttocks and syncope episode on 04/24/24 at 5:30, BP afterwards- 130/85, pulse- 56. Pt accompanied by wife, Dylan [name]. )   Recent ER visit for L arm and leg pain on 04/25/2024.  Workup unrevealing - CBC WNL, CMP with new hypokalemia which was repleted, as well as magnesium  repleted. Head CT - no acute process.  Also found to have low calcium  levels (with concomitant low protein).   ?hypokalemia contributing to MSK pains/cramping.   He is on fludrocortisone  which can lead to low potassium and magnesium .  He had also had recent increase in loose stools/incontinence for 1-2 wks - this continues but is becoming more formed. Mucinex /senna remains on hold.   Noted more restless.  Buttock rash persists. Initially improving but recently worse. Using paper towels with warm water. Continues zinc  barrier cream use. Started using baby washes to clean bottom.   Next palliative care appt is next week with new RN.   Pt specifically denies any pain or discomfort when questioned.      Relevant past medical, surgical, family and social history reviewed and updated as indicated. Interim medical history since our last visit reviewed. Allergies and medications reviewed and updated. Outpatient Medications Prior to Visit  Medication Sig Dispense Refill   acetaminophen  (TYLENOL ) 325 MG tablet Take 2 tablets (650 mg total) by mouth every 6 (six) hours as needed for mild pain (or Fever >/= 101). 20 tablet 0   aspirin  EC 81 MG tablet Take 1  tablet (81 mg total) by mouth daily. Swallow whole.     atorvastatin  (LIPITOR) 20 MG tablet Take 1 tablet (20 mg total) by mouth daily.     Cholecalciferol  (VITAMIN D3) 1000 units CAPS Take 1 capsule (1,000 Units total) by mouth daily. 30 capsule    fexofenadine  (ALLEGRA  ALLERGY) 180 MG tablet Take 1 tablet (180 mg total) by mouth daily.     finasteride  (PROSCAR ) 5 MG tablet Take 1 tablet (5 mg total) by mouth daily. 90 tablet 4   fludrocortisone  (FLORINEF ) 0.1 MG tablet Take 2 tablets (0.2 mg total) by mouth daily. 180 tablet 3   FLUoxetine  (PROZAC ) 40 MG capsule Take 1 capsule (40 mg total) by mouth daily. 90 capsule 2   guaiFENesin  (MUCINEX ) 600 MG 12 hr tablet Take 1 tablet (600 mg total) by mouth daily.     midodrine  (PROAMATINE ) 5 MG tablet Take 1 tablet (5 mg total) by mouth 3 (three) times daily with meals. 90 tablet 6   Polyethylene Glycol 3350  (MIRALAX  PO) Take 17 g by mouth daily. As needed     senna (SENOKOT) 8.6 MG tablet Take 1 tablet (8.6 mg total) by mouth daily as needed.     vitamin B-12 (V-R VITAMIN B-12) 500 MCG tablet Take 1 tablet (500 mcg total) by mouth daily. (Patient taking differently: Take 500 mcg by mouth at bedtime.)     potassium chloride  SA (KLOR-CON   M) 20 MEQ tablet Take 1 tablet (20 mEq total) by mouth daily for 2 days. 2 tablet 0   No facility-administered medications prior to visit.     Per HPI unless specifically indicated in ROS section below Review of Systems  Unable to perform ROS: Dementia    Objective:  BP 134/82   Pulse 64   Temp 98 F (36.7 C) (Oral)   Ht 6' (1.829 m)   SpO2 97%   BMI 22.38 kg/m   Wt Readings from Last 3 Encounters:  04/25/24 165 lb (74.8 kg)  02/24/24 172 lb 4 oz (78.1 kg)  12/23/23 169 lb 4 oz (76.8 kg)      Physical Exam Vitals and nursing note reviewed.  Constitutional:      Appearance: Normal appearance. He is not ill-appearing.     Comments:  Sitting in transport chair Alert. Oriented x1 to person,  recognizes wife as well.  Unable to say where he is   HENT:     Mouth/Throat:     Mouth: Mucous membranes are moist.     Pharynx: Oropharynx is clear. No oropharyngeal exudate or posterior oropharyngeal erythema.   Eyes:     Extraocular Movements: Extraocular movements intact.     Pupils: Pupils are equal, round, and reactive to light.    Cardiovascular:     Rate and Rhythm: Normal rate and regular rhythm.     Pulses: Normal pulses.     Heart sounds: Normal heart sounds. No murmur heard. Pulmonary:     Effort: Pulmonary effort is normal. No respiratory distress.     Breath sounds: Normal breath sounds. No wheezing, rhonchi or rales.   Musculoskeletal:        General: No tenderness. Normal range of motion.     Right lower leg: No edema.     Left lower leg: No edema.     Comments: No reproducible pain to palpation of lower extremities    Neurological:     Mental Status: He is alert.       Results for orders placed or performed in visit on 04/30/24  Basic metabolic panel with GFR   Collection Time: 04/30/24  2:45 PM  Result Value Ref Range   Glucose, Bld 93 65 - 99 mg/dL   BUN 15 7 - 25 mg/dL   Creat 8.85 9.29 - 8.77 mg/dL   eGFR 62 > OR = 60 fO/fpw/8.26f7   BUN/Creatinine Ratio SEE NOTE: 6 - 22 (calc)   Sodium 141 135 - 146 mmol/L   Potassium 3.0 (L) 3.5 - 5.3 mmol/L   Chloride 102 98 - 110 mmol/L   CO2 26 20 - 32 mmol/L   Calcium  8.7 8.6 - 10.3 mg/dL  Magnesium    Collection Time: 04/30/24  2:45 PM  Result Value Ref Range   Magnesium  1.8 1.5 - 2.5 mg/dL    Assessment & Plan:   Problem List Items Addressed This Visit     Dementia with agitation (HCC) (Chronic)   Palliative care patient (Chronic)   Palliative care involved. RN coming out next week.       Sexual disinhibition   Perineal rash in male   Reviewed pictures wife brings. She has been treating with zinc  barrier creams  Initially thought pressure sore, however now concern for component of yeast  infection - Rx nystatin . Continue regular barrier cream use  Recent loose stools contributory.       Orthostatic syncope   Continue supportive measures (abd binder, liberal salt  intake). Avoiding compression stockings in h/o PAD.  Continue regular fludrocortisone  and midodrine  use.       Hypocalcemia   Noted at ER. Update labs today including PTH.       Relevant Orders   PTH, intact and calcium    Basic metabolic panel with GFR (Completed)   Magnesium  (Completed)   Bowel incontinence   Wife is doing a great job caring for patient, but discussed possible increased level of care need with new loose stools/incontinence. To consider additional care at home vs alternative care site.  Wife is hopeful for pt status to improve so he can return to adult daycare at [place] 3d/wk       Hypokalemia - Primary   Anticipate low K led to worsening cramping as cause of recent L extremity pains.  Recent loose stools/diarrhea contributory. Anticipate fludrocortisone  use also contributes.  He is otherwise not on diuretic or other contributory medication. Will update labs including magnesium , will also Rx daily potassium supplement.  Potassium chloride  capsules 10mEq daily Micro-K  chosen so wife can sprinkle contents onto food to facilitate administration - pt cannot swallow large tablets.         Meds ordered this encounter  Medications   nystatin  cream (MYCOSTATIN )    Sig: Apply 1 Application topically 2 (two) times daily.    Dispense:  60 g    Refill:  1   potassium chloride  (MICRO-K ) 10 MEQ CR capsule    Sig: Take 1 capsule (10 mEq total) by mouth daily. May open and sprinkle contents onto applesauce or equivalent    Dispense:  90 capsule    Refill:  0    Orders Placed This Encounter  Procedures   PTH, intact and calcium    Basic metabolic panel with GFR   Magnesium     Patient Instructions  Continue barrier cream as up to now for buttock rash, add nystatin  cream twice daily.   Start potassium capsules 10mEq daily - may sprinkle onto applesauce or equivalent.  Labs today  Keep follow up appointment next month.   Follow up plan: Return if symptoms worsen or fail to improve.  Anton Blas, MD

## 2024-04-30 NOTE — Patient Instructions (Addendum)
 Continue barrier cream as up to now for buttock rash, add nystatin  cream twice daily.  Start potassium capsules 10mEq daily - may sprinkle onto applesauce or equivalent.  Labs today  Keep follow up appointment next month.

## 2024-05-01 DIAGNOSIS — Z515 Encounter for palliative care: Secondary | ICD-10-CM | POA: Insufficient documentation

## 2024-05-01 DIAGNOSIS — E876 Hypokalemia: Secondary | ICD-10-CM | POA: Insufficient documentation

## 2024-05-01 LAB — BASIC METABOLIC PANEL WITH GFR
BUN: 15 mg/dL (ref 7–25)
CO2: 26 mmol/L (ref 20–32)
Calcium: 8.7 mg/dL (ref 8.6–10.3)
Chloride: 102 mmol/L (ref 98–110)
Creat: 1.14 mg/dL (ref 0.70–1.22)
Glucose, Bld: 93 mg/dL (ref 65–99)
Potassium: 3 mmol/L — ABNORMAL LOW (ref 3.5–5.3)
Sodium: 141 mmol/L (ref 135–146)
eGFR: 62 mL/min/{1.73_m2} (ref >=60)

## 2024-05-01 LAB — MAGNESIUM: Magnesium: 1.8 mg/dL (ref 1.5–2.5)

## 2024-05-01 NOTE — Assessment & Plan Note (Signed)
 Palliative care involved. RN coming out next week.

## 2024-05-01 NOTE — Assessment & Plan Note (Signed)
 Continue supportive measures (abd binder, liberal salt intake). Avoiding compression stockings in h/o PAD.  Continue regular fludrocortisone  and midodrine  use.

## 2024-05-01 NOTE — Assessment & Plan Note (Addendum)
 Anticipate low K led to worsening cramping as cause of recent L extremity pains.  Recent loose stools/diarrhea contributory. Anticipate fludrocortisone  use also contributes.  He is otherwise not on diuretic or other contributory medication. Will update labs including magnesium , will also Rx daily potassium supplement.  Potassium chloride  capsules 10mEq daily Micro-K  chosen so wife can sprinkle contents onto food to facilitate administration - pt cannot swallow large tablets.

## 2024-05-01 NOTE — Assessment & Plan Note (Signed)
 Noted at ER. Update labs today including PTH.

## 2024-05-01 NOTE — Assessment & Plan Note (Addendum)
 Reviewed pictures wife brings. She has been treating with zinc  barrier creams  Initially thought pressure sore, however now concern for component of yeast infection - Rx nystatin . Continue regular barrier cream use  Recent loose stools contributory.

## 2024-05-01 NOTE — Assessment & Plan Note (Signed)
 Wife is doing a great job caring for patient, but discussed possible increased level of care need with new loose stools/incontinence. To consider additional care at home vs alternative care site.  Wife is hopeful for pt status to improve so he can return to [facility] [place] 3d[facility]

## 2024-05-03 LAB — PTH, INTACT AND CALCIUM
Calcium: 8.7 mg/dL (ref 8.6–10.3)
PTH: 23 pg/mL (ref 16–77)

## 2024-05-06 ENCOUNTER — Ambulatory Visit: Payer: Self-pay | Admitting: Family Medicine

## 2024-05-07 ENCOUNTER — Telehealth: Payer: Self-pay

## 2024-05-07 NOTE — Telephone Encounter (Signed)
 Copied from CRM [id number]. Topic: General - Other >> May 07, 2024  3:16 PM Robinson H wrote: Reason for CRM: [facility] calling to see if provider is okay to transition patient from palliative care to hospice care.  [facility] [phone]

## 2024-05-07 NOTE — Telephone Encounter (Signed)
 Agree with this recommendation due to progressive decline.  If course of illness progresses as anticipated, certify pt has 6 months or less to live.  I can be hospice attending but would like hospice assistance with comfort measures as needed.  I spoke with Joen regarding all of this.

## 2024-05-12 ENCOUNTER — Ambulatory Visit: Admitting: Family Medicine

## 2024-05-25 ENCOUNTER — Ambulatory Visit: Admitting: Family Medicine

## 2024-07-26 ENCOUNTER — Telehealth: Payer: Self-pay | Admitting: Family Medicine

## 2024-07-26 NOTE — Telephone Encounter (Signed)
 Spoke with wife [name] to express my condolences.

## 2024-08-11 DEATH — deceased

## 2024-09-13 ENCOUNTER — Ambulatory Visit: Admitting: Dermatology

## 2024-09-16 ENCOUNTER — Ambulatory Visit: Payer: Medicare Other | Admitting: Dermatology
